# Patient Record
Sex: Female | Born: 1975 | Race: Black or African American | Hispanic: No
Health system: Midwestern US, Community
[De-identification: ages and names within clinical notes are randomized; demographics above are authoritative.]

## PROBLEM LIST (undated history)

## (undated) DIAGNOSIS — N644 Mastodynia: Secondary | ICD-10-CM

## (undated) DIAGNOSIS — R0789 Other chest pain: Secondary | ICD-10-CM

## (undated) DIAGNOSIS — E039 Hypothyroidism, unspecified: Secondary | ICD-10-CM

## (undated) DIAGNOSIS — Z794 Long term (current) use of insulin: Secondary | ICD-10-CM

## (undated) DIAGNOSIS — N760 Acute vaginitis: Secondary | ICD-10-CM

## (undated) DIAGNOSIS — R0609 Other forms of dyspnea: Secondary | ICD-10-CM

## (undated) DIAGNOSIS — G629 Polyneuropathy, unspecified: Secondary | ICD-10-CM

## (undated) DIAGNOSIS — E119 Type 2 diabetes mellitus without complications: Secondary | ICD-10-CM

## (undated) DIAGNOSIS — R1310 Dysphagia, unspecified: Secondary | ICD-10-CM

## (undated) DIAGNOSIS — E0800 Diabetes mellitus due to underlying condition with hyperosmolarity without nonketotic hyperglycemic-hyperosmolar coma (NKHHC): Secondary | ICD-10-CM

## (undated) DIAGNOSIS — B9689 Other specified bacterial agents as the cause of diseases classified elsewhere: Secondary | ICD-10-CM

## (undated) DIAGNOSIS — R079 Chest pain, unspecified: Secondary | ICD-10-CM

## (undated) DIAGNOSIS — I10 Essential (primary) hypertension: Secondary | ICD-10-CM

## (undated) DIAGNOSIS — M5430 Sciatica, unspecified side: Secondary | ICD-10-CM

## (undated) DIAGNOSIS — R1010 Upper abdominal pain, unspecified: Secondary | ICD-10-CM

## (undated) DIAGNOSIS — F3181 Bipolar II disorder: Principal | ICD-10-CM

## (undated) DIAGNOSIS — E559 Vitamin D deficiency, unspecified: Secondary | ICD-10-CM

## (undated) DIAGNOSIS — R000 Tachycardia, unspecified: Secondary | ICD-10-CM

## (undated) DIAGNOSIS — G44209 Tension-type headache, unspecified, not intractable: Secondary | ICD-10-CM

## (undated) DIAGNOSIS — R0600 Dyspnea, unspecified: Principal | ICD-10-CM

## (undated) DIAGNOSIS — R1012 Left upper quadrant pain: Secondary | ICD-10-CM

## (undated) DIAGNOSIS — R700 Elevated erythrocyte sedimentation rate: Secondary | ICD-10-CM

## (undated) DIAGNOSIS — M5442 Lumbago with sciatica, left side: Secondary | ICD-10-CM

## (undated) DIAGNOSIS — K047 Periapical abscess without sinus: Secondary | ICD-10-CM

## (undated) DIAGNOSIS — E785 Hyperlipidemia, unspecified: Secondary | ICD-10-CM

## (undated) DIAGNOSIS — Z6841 Body Mass Index (BMI) 40.0 and over, adult: Secondary | ICD-10-CM

## (undated) DIAGNOSIS — H93A3 Pulsatile tinnitus, bilateral: Principal | ICD-10-CM

## (undated) DIAGNOSIS — Z0130 Encounter for examination of blood pressure without abnormal findings: Secondary | ICD-10-CM

## (undated) DIAGNOSIS — E66813 Obesity, class 3: Secondary | ICD-10-CM

## (undated) DIAGNOSIS — R1030 Lower abdominal pain, unspecified: Secondary | ICD-10-CM

## (undated) DIAGNOSIS — G8929 Other chronic pain: Secondary | ICD-10-CM

## (undated) DIAGNOSIS — Z0000 Encounter for general adult medical examination without abnormal findings: Secondary | ICD-10-CM

## (undated) DIAGNOSIS — K5904 Chronic idiopathic constipation: Secondary | ICD-10-CM

## (undated) DIAGNOSIS — N939 Abnormal uterine and vaginal bleeding, unspecified: Secondary | ICD-10-CM

## (undated) DIAGNOSIS — M5441 Lumbago with sciatica, right side: Secondary | ICD-10-CM

## (undated) DIAGNOSIS — K760 Fatty (change of) liver, not elsewhere classified: Secondary | ICD-10-CM

## (undated) DIAGNOSIS — M1990 Unspecified osteoarthritis, unspecified site: Secondary | ICD-10-CM

## (undated) DIAGNOSIS — Z8679 Personal history of other diseases of the circulatory system: Secondary | ICD-10-CM

## (undated) DIAGNOSIS — M792 Neuralgia and neuritis, unspecified: Secondary | ICD-10-CM

## (undated) DIAGNOSIS — K219 Gastro-esophageal reflux disease without esophagitis: Secondary | ICD-10-CM

## (undated) DIAGNOSIS — I2510 Atherosclerotic heart disease of native coronary artery without angina pectoris: Secondary | ICD-10-CM

## (undated) DIAGNOSIS — F959 Tic disorder, unspecified: Secondary | ICD-10-CM

## (undated) DIAGNOSIS — K589 Irritable bowel syndrome without diarrhea: Secondary | ICD-10-CM

## (undated) DIAGNOSIS — R51 Headache: Secondary | ICD-10-CM

## (undated) DIAGNOSIS — E7800 Pure hypercholesterolemia, unspecified: Secondary | ICD-10-CM

## (undated) DIAGNOSIS — J45909 Unspecified asthma, uncomplicated: Secondary | ICD-10-CM

## (undated) HISTORY — PX: ABDOMINAL HYSTERECTOMY: SHX81

## (undated) HISTORY — PX: CHOLECYSTECTOMY: SHX55

## (undated) MED ORDER — IRBESARTAN 150 MG TAB
150 mg | ORAL_TABLET | Freq: Every evening | ORAL | Status: DC
Start: ? — End: 2021-03-07

## (undated) MED ORDER — MECLIZINE 25 MG TAB
25 mg | ORAL_TABLET | Freq: Three times a day (TID) | ORAL | Status: AC | PRN
Start: ? — End: 2013-03-10

## (undated) MED ORDER — RANITIDINE 150 MG TAB
150 mg | ORAL_TABLET | Freq: Two times a day (BID) | ORAL | Status: DC
Start: ? — End: 2019-10-22

## (undated) MED ORDER — HYDROCHLOROTHIAZIDE 12.5 MG TAB
12.5 mg | ORAL_TABLET | Freq: Every day | ORAL | Status: DC
Start: ? — End: 2020-04-28

## (undated) MED ORDER — AMLODIPINE 10 MG TAB
10 mg | ORAL_TABLET | Freq: Every day | ORAL | Status: DC
Start: ? — End: 2019-10-22

## (undated) MED ORDER — ATORVASTATIN 20 MG TAB
20 mg | ORAL_TABLET | Freq: Every day | ORAL | Status: DC
Start: ? — End: 2019-10-22

## (undated) MED ORDER — METFORMIN 1,000 MG TAB
1000 mg | ORAL_TABLET | Freq: Two times a day (BID) | ORAL | Status: DC
Start: ? — End: 2019-10-22

## (undated) MED ORDER — ONDANSETRON HCL 4 MG TAB
4 mg | ORAL_TABLET | Freq: Three times a day (TID) | ORAL | Status: DC | PRN
Start: ? — End: 2020-04-28

## (undated) MED ORDER — MAGNESIUM OXIDE 250 MG TAB
250 mg magnesium | ORAL_TABLET | Freq: Two times a day (BID) | ORAL | Status: AC
Start: ? — End: 2013-02-22

## (undated) MED ORDER — HYDROCODONE-ACETAMINOPHEN 5 MG-325 MG TAB
5-325 mg | ORAL_TABLET | ORAL | Status: DC
Start: ? — End: 2020-04-28

## (undated) MED ORDER — GABAPENTIN 600 MG TAB
600 mg | ORAL_TABLET | Freq: Three times a day (TID) | ORAL | Status: DC
Start: ? — End: 2020-04-28

## (undated) MED ORDER — HYDROCODONE-ACETAMINOPHEN 5 MG-325 MG TAB
5-325 mg | ORAL_TABLET | ORAL | Status: DC
Start: ? — End: 2012-12-10

---

## 2009-10-01 ENCOUNTER — Ambulatory Visit: Payer: Self-pay | Admitting: Diagnostic Radiology

## 2009-10-01 ENCOUNTER — Emergency Department (HOSPITAL_BASED_OUTPATIENT_CLINIC_OR_DEPARTMENT_OTHER): Admission: EM | Admit: 2009-10-01 | Discharge: 2009-10-01 | Payer: Self-pay | Admitting: Emergency Medicine

## 2009-11-11 ENCOUNTER — Emergency Department (HOSPITAL_BASED_OUTPATIENT_CLINIC_OR_DEPARTMENT_OTHER): Admission: EM | Admit: 2009-11-11 | Discharge: 2009-11-11 | Payer: Self-pay | Admitting: Emergency Medicine

## 2011-01-15 LAB — URINE CULTURE

## 2011-01-15 LAB — URINALYSIS, ROUTINE W REFLEX MICROSCOPIC
Bilirubin Urine: NEGATIVE
Hgb urine dipstick: NEGATIVE
Ketones, ur: NEGATIVE mg/dL
Nitrite: NEGATIVE
Specific Gravity, Urine: 1.015 (ref 1.005–1.030)
Urobilinogen, UA: 0.2 mg/dL (ref 0.0–1.0)

## 2011-01-31 LAB — GLUCOSE, CAPILLARY

## 2011-01-31 LAB — COMPREHENSIVE METABOLIC PANEL
AST: 19 U/L (ref 0–37)
Albumin: 4.3 g/dL (ref 3.5–5.2)
Alkaline Phosphatase: 80 U/L (ref 39–117)
CO2: 28 mEq/L (ref 19–32)
Chloride: 106 mEq/L (ref 96–112)
GFR calc non Af Amer: >60 mL/min (ref >60)
Potassium: 4.1 mEq/L (ref 3.5–5.1)
Total Bilirubin: 0.3 mg/dL (ref 0.3–1.2)

## 2011-01-31 LAB — DIFFERENTIAL
Basophils Absolute: 0 10*3/uL (ref 0.0–0.1)
Basophils Relative: 0 % (ref 0–1)
Eosinophils Absolute: 0.4 10*3/uL (ref 0.0–0.7)
Eosinophils Relative: 4 % (ref 0–5)
Monocytes Absolute: 0.3 10*3/uL (ref 0.1–1.0)
Monocytes Relative: 3 % (ref 3–12)

## 2011-01-31 LAB — URINALYSIS, ROUTINE W REFLEX MICROSCOPIC
Glucose, UA: NEGATIVE mg/dL
Ketones, ur: NEGATIVE mg/dL
Nitrite: NEGATIVE
Protein, ur: NEGATIVE mg/dL
pH: 8.5 — ABNORMAL HIGH (ref 5.0–8.0)

## 2011-01-31 LAB — CBC
Hemoglobin: 12.1 g/dL (ref 12.0–15.0)
Platelets: 268 10*3/uL (ref 150–400)
RDW: 12.6 % (ref 11.5–15.5)
WBC: 10.4 10*3/uL (ref 4.0–10.5)

## 2011-06-06 LAB — AMB POC HEMOGLOBIN A1C: Hemoglobin A1c (POC): 6.2

## 2011-06-06 LAB — AMB POC GLUCOSE BLOOD, BY GLUCOSE MONITORING DEVICE: Glucose POC: 153

## 2011-06-06 MED ORDER — DICYCLOMINE 10 MG CAP
10 mg | ORAL_CAPSULE | Freq: Three times a day (TID) | ORAL | Status: DC
Start: 2011-06-06 — End: 2020-04-28

## 2011-06-06 MED ORDER — GLIPIZIDE 5 MG TAB
5 mg | ORAL_TABLET | Freq: Two times a day (BID) | ORAL | Status: DC
Start: 2011-06-06 — End: 2011-12-27

## 2011-06-06 MED ORDER — HYDROCHLOROTHIAZIDE 12.5 MG TAB
12.5 mg | ORAL_TABLET | Freq: Every day | ORAL | Status: DC
Start: 2011-06-06 — End: 2012-12-10

## 2011-06-06 MED ORDER — METFORMIN 1,000 MG TAB
1000 mg | ORAL_TABLET | Freq: Two times a day (BID) | ORAL | Status: DC
Start: 2011-06-06 — End: 2012-12-10

## 2011-06-06 MED ORDER — ATORVASTATIN 20 MG TAB
20 mg | ORAL_TABLET | Freq: Every day | ORAL | Status: DC
Start: 2011-06-06 — End: 2012-12-10

## 2011-06-06 MED ORDER — IRBESARTAN 150 MG TAB
150 mg | ORAL_TABLET | Freq: Every evening | ORAL | Status: DC
Start: 2011-06-06 — End: 2012-12-10

## 2011-06-06 MED ORDER — OMEPRAZOLE 20 MG CAP, DELAYED RELEASE
20 mg | ORAL_CAPSULE | Freq: Two times a day (BID) | ORAL | Status: DC
Start: 2011-06-06 — End: 2020-04-28

## 2011-06-06 NOTE — Progress Notes (Signed)
Pt being seen for hospital follow up and establish care

## 2011-06-06 NOTE — Progress Notes (Signed)
HISTORY OF PRESENT ILLNESS  Christy [name] is a 35 y.o. female.  HPIEstablish care-1.DM2 2 HTN 3.Hyperlipidemia 4.Obesity.Recent hospitalisation at [facility] with chest pain-stress test-apical ischemia/CT angiogram-20% blockage of LAD.Reviewed detail hospital records.    Review of Systems   Constitutional: Negative for malaise/fatigue.   HENT: Negative for nosebleeds.    Eyes: Negative for blurred vision, double vision and pain.   Respiratory: Negative for cough, shortness of breath and wheezing.    Cardiovascular: Negative for chest pain, palpitations, orthopnea, claudication, leg swelling and PND.   Neurological: Negative for dizziness, weakness and headaches.   All other systems reviewed and are negative.      Patient Active Problem List   Diagnoses Code   ??? CAD (coronary artery disease) 414.00   ??? Hyperlipidemia LDL goal < 70 272.4   ??? Hypertension 401.9   ??? Obesity 278.00   ??? DM (diabetes mellitus) 250.00   ??? GERD (gastroesophageal reflux disease) 530.81     Current Outpatient Prescriptions   Medication Sig Dispense Refill   ??? metFORMIN (GLUCOPHAGE) 1,000 mg tablet Take 1 Tab by mouth two (2) times daily (with meals).  60 Tab  5   ??? glipiZIDE (GLUCOTROL) 5 mg tablet Take 1 Tab by mouth two (2) times a day.  30 Tab  5   ??? atorvastatin (LIPITOR) 20 mg tablet Take 1 Tab by mouth daily.  30 Tab  5   ??? irbesartan (AVAPRO) 150 mg tablet Take 1 Tab by mouth nightly.  30 Tab  5   ??? Hydrochlorothiazide 12.5 mg tablet Take 12.5 mg by mouth daily.  30 Tab  5   ??? omeprazole (PRILOSEC) 20 mg capsule Take 1 Cap by mouth two (2) times a day.  60 Cap  6   ??? dicyclomine (BENTYL) 10 mg capsule Take 1 Cap by mouth three (3) times daily. PRN  30 Cap  0       Blood pressure 140/92, pulse 96, temperature 98.4 ??F (36.9 ??C), temperature source Oral, resp. rate 18, weight 244 lb (110.678 kg).    Physical Exam   Constitutional: She is oriented to person, place, and time. She appears well-developed and well-nourished.   HENT:    Head: Normocephalic.   Eyes: Pupils are equal, round, and reactive to light.   Neck: No JVD present. No tracheal deviation present. No thyromegaly present.   Cardiovascular: Normal rate, regular rhythm, normal heart sounds and intact distal pulses.   No extrasystoles are present. PMI is not displaced.  Exam reveals no gallop, no S3, no S4, no distant heart sounds and no friction rub.    No murmur heard.  Pulses:       Carotid pulses are 1+ on the right side, and 1+ on the left side.  Pulmonary/Chest: Effort normal and breath sounds normal. No respiratory distress. She has no wheezes. She has no rales. She exhibits no tenderness.   Abdominal: Soft. Bowel sounds are normal. She exhibits no abdominal bruit. There is no splenomegaly or hepatomegaly.   Musculoskeletal: She exhibits no edema.   Lymphadenopathy:     She has no cervical adenopathy.   Neurological: She is alert and oriented to person, place, and time. She displays normal reflexes. No cranial nerve deficit. She exhibits normal muscle tone. Coordination normal.   Skin: Skin is warm. No rash noted.   Psychiatric: She has a normal mood and affect. Her behavior is normal. Judgment and thought content normal.     Results for orders  placed in visit on 06/06/11   AMB POC GLUCOSE BLOOD, BY GLUCOSE MONITORING DEVICE       Component Value Range    Glucose POC 153     AMB POC HEMOGLOBIN A1C       Component Value Range    Hemoglobin A1C 6.2         ASSESSMENT and PLAN  1. DM (diabetes mellitus)  AMB POC GLUCOSE BLOOD, BY GLUCOSE MONITORING DEVICE, AMB POC HEMOGLOBIN A1C   2. Obesity     3. Hypertension     4. Hyperlipidemia LDL goal < 70     5. CAD (coronary artery disease)     6. GERD (gastroesophageal reflux disease)       Medications Ordered Today   Medications   ??? metFORMIN (GLUCOPHAGE) 1,000 mg tablet     Sig: Take 1 Tab by mouth two (2) times daily (with meals).     Dispense:  60 Tab     Refill:  5   ??? glipiZIDE (GLUCOTROL) 5 mg tablet      Sig: Take 1 Tab by mouth two (2) times a day.     Dispense:  30 Tab     Refill:  5   ??? atorvastatin (LIPITOR) 20 mg tablet     Sig: Take 1 Tab by mouth daily.     Dispense:  30 Tab     Refill:  5   ??? irbesartan (AVAPRO) 150 mg tablet     Sig: Take 1 Tab by mouth nightly.     Dispense:  30 Tab     Refill:  5   ??? Hydrochlorothiazide 12.5 mg tablet     Sig: Take 12.5 mg by mouth daily.     Dispense:  30 Tab     Refill:  5   ??? omeprazole (PRILOSEC) 20 mg capsule     Sig: Take 1 Cap by mouth two (2) times a day.     Dispense:  60 Cap     Refill:  6   ??? dicyclomine (BENTYL) 10 mg capsule     Sig: Take 1 Cap by mouth three (3) times daily. PRN     Dispense:  30 Cap     Refill:  0       the following changes in treatment are made: as above  lab results and schedule of future lab studies reviewed with patient  reviewed diet, exercise and weight control  cardiovascular risk and specific lipid/LDL goals reviewed  reviewed medications and side effects in detail  use of aspirin to prevent MI and TIA's discussed

## 2011-06-16 NOTE — Telephone Encounter (Signed)
I called this patient on Thursday and Friday and left a message letting her know that without her telling me what medications she has tried before there is no way I can get authorization from her insurance company.

## 2011-10-03 LAB — AMB POC GLUCOSE BLOOD, BY GLUCOSE MONITORING DEVICE: Glucose POC: 143

## 2011-10-03 MED ORDER — FLUCONAZOLE 150 MG TAB
150 mg | ORAL_TABLET | Freq: Every day | ORAL | Status: DC
Start: 2011-10-03 — End: 2011-11-29

## 2011-10-03 MED ORDER — METRONIDAZOLE 500 MG TAB
500 mg | ORAL_TABLET | Freq: Three times a day (TID) | ORAL | Status: AC
Start: 2011-10-03 — End: 2011-10-13

## 2011-10-03 NOTE — Progress Notes (Signed)
error 

## 2011-10-03 NOTE — Progress Notes (Signed)
Error see other encounter

## 2011-10-03 NOTE — Progress Notes (Signed)
Pt being seen for vaginal odor.

## 2011-10-03 NOTE — Progress Notes (Signed)
HISTORY OF PRESENT ILLNESS  Christy [name] is a 35 y.o. female.  Vaginal Discharge   This is a new (mainly concerned about vaginal odor-currently on antibiotic) problem. The current episode started more than 2 days ago. The problem has not changed since onset.The discharge was white. Pertinent negatives include no anorexia, no diaphoresis, no fever, no abdominal swelling, no abdominal pain, no constipation, no diarrhea, no nausea, no vomiting, no dyspareunia, no dysuria, no frequency, no genital burning, no genital itching, no genital lesions, no perineal pain, no perineal odor and no painful intercourse. She has tried nothing for the symptoms. The treatment provided no relief. Her past medical history does not include irregular periods, PID, STD, ectopic pregnancy, ovarian cysts or infertility.   Diabetes  This is a chronic problem. The problem has not changed since onset.Pertinent negatives include no chest pain, no abdominal pain, no headaches and no shortness of breath. Treatments tried: currently on OAD,last hba1c=6.2,last opth exam had today.   Hypertension   This is a chronic problem. The problem has not changed since onset.Pertinent negatives include no chest pain, no orthopnea, no palpitations, no PND, no malaise/fatigue, no blurred vision, no headaches, no dizziness, no shortness of breath, no nausea and no vomiting. There are no associated agents to hypertension. Risk factors include family history, dyslipidemia, diabetes mellitus, obesity and a sedentary lifestyle.   Chest Pain (Angina)    This is a recurrent (chest pain x 2-3 months-seen in ER had angiogram showed mild blockage of CAD.Recent worsening of chest pain 7/10 today with substernal pressure) problem. The problem has not changed since onset.The problem occurs daily. The pain is present in the substernal region. The pain is at a severity of 6/10. The pain is moderate. The pain does not radiate. Associated symptoms include exertional chest pressure. Pertinent negatives include no abdominal pain, no back pain, no claudication, no cough, no diaphoresis, no dizziness, no fever, no headaches, no malaise/fatigue, no nausea, no orthopnea, no palpitations, no PND, no shortness of breath, no vomiting and no weakness. She has tried nothing for the symptoms. The treatment provided no relief. Risk factors include family history, diabetes mellitus, obesity, a sendentary lifestyle, smoking/tobacco exposure and dyslipidemia. Procedural history includes cardiac catheterization.Pertinent negatives include no echocardiogram, no EPS study, no persantine thallium, no stress echo, no stress thallium, no exercise treadmill test, no cardiac stents, no angioplasty, no [place] stents, no pacemaker and no AICD.      Review of Systems   Constitutional: Negative for fever, chills, malaise/fatigue and diaphoresis.   HENT: Negative for nosebleeds.    Eyes: Negative for blurred vision, double vision and pain.   Respiratory: Negative for cough, shortness of breath and wheezing.    Cardiovascular: Negative for chest pain, palpitations, orthopnea, claudication, leg swelling and PND.   Gastrointestinal: Negative for nausea, vomiting, abdominal pain, diarrhea, constipation and anorexia.   Genitourinary: Positive for vaginal discharge. Negative for dysuria, frequency and dyspareunia.   Musculoskeletal: Negative for back pain.   Neurological: Negative for dizziness, weakness and headaches.   All other systems reviewed and are negative.      Patient Active Problem List    Diagnoses Code   ??? CAD (coronary artery disease) 414.00   ??? Hyperlipidemia LDL goal < 70 272.4   ??? Hypertension 401.9   ??? Obesity 278.00   ??? DM (diabetes mellitus) 250.00   ??? GERD (gastroesophageal reflux disease) 530.81     Current Outpatient Prescriptions   Medication Sig Dispense Refill   ???  fluconazole (DIFLUCAN) 150 mg tablet Take 1 Tab by mouth daily for 1 day.  1 Tab  0   ??? metroNIDAZOLE (FLAGYL) 500 mg tablet Take 1 Tab by mouth three (3) times daily for 10 days.  30 Tab  0   ??? oxyCODONE-acetaminophen (ENDOCET) 5-325 mg per tablet Take 1 Tab by mouth every four (4) hours as needed.         ??? naproxen (NAPROSYN) 500 mg tablet Take 500 mg by mouth two (2) times daily (with meals).         ??? metFORMIN (GLUCOPHAGE) 1,000 mg tablet Take 1 Tab by mouth two (2) times daily (with meals).  60 Tab  5   ??? glipiZIDE (GLUCOTROL) 5 mg tablet Take 1 Tab by mouth two (2) times a day.  30 Tab  5   ??? atorvastatin (LIPITOR) 20 mg tablet Take 1 Tab by mouth daily.  30 Tab  5   ??? irbesartan (AVAPRO) 150 mg tablet Take 1 Tab by mouth nightly.  30 Tab  5   ??? Hydrochlorothiazide 12.5 mg tablet Take 12.5 mg by mouth daily.  30 Tab  5   ??? omeprazole (PRILOSEC) 20 mg capsule Take 1 Cap by mouth two (2) times a day.  60 Cap  6   ??? dicyclomine (BENTYL) 10 mg capsule Take 1 Cap by mouth three (3) times daily. PRN  30 Cap  0       Blood pressure 134/88, pulse 78, temperature 99.2 ??F (37.3 ??C), temperature source Oral, resp. rate 18, height 5\' 1"  (1.549 m), weight 252 lb (114.306 kg).    Physical Exam   Constitutional: She appears well-developed and well-nourished.   HENT:   Mouth/Throat: Dental abscesses present.   Neck: No JVD present. No tracheal deviation present. No thyromegaly present.   Cardiovascular: Normal rate, regular rhythm, normal heart sounds and intact distal pulses.   No extrasystoles are present. PMI is not displaced.  Exam reveals no gallop, no S3, no S4, no distant heart sounds and no friction rub.    No murmur heard.   Pulses:       Carotid pulses are 1+ on the right side, and 1+ on the left side.  Pulmonary/Chest: Effort normal and breath sounds normal. No respiratory distress. She has no wheezes. She has no rales. She exhibits no tenderness.   Abdominal: Soft. Bowel sounds are normal. She exhibits no abdominal bruit. There is no splenomegaly or hepatomegaly.   Genitourinary: There is no rash, tenderness, lesion or injury on the right labia. There is no rash, tenderness, lesion or injury on the left labia. Cervix exhibits no discharge. There is erythema around the vagina. No tenderness or bleeding around the vagina. No foreign body around the vagina. Vaginal discharge found.   Musculoskeletal: She exhibits no edema.   Lymphadenopathy:     She has no cervical adenopathy.   Skin: Skin is warm. No rash noted.   Psychiatric: Her behavior is normal. Judgment normal.       ASSESSMENT and PLAN  1. Vaginal Discharge  WET PREP, CHLAMYDIA/GC AMPLIFIED, HIV 1/O/2 AB WITH CONFIRMATION, RPR, HSV TYPE 2 AB IGG, CSF   2. Vaginal odor  WET PREP, CHLAMYDIA/GC AMPLIFIED   3. CAD (coronary artery disease)  REFERRAL TO CARDIOLOGY, AMB POC EKG ROUTINE W/ 12 LEADS, INTER & REP   4. DM (diabetes mellitus)  METABOLIC PANEL, COMPREHENSIVE, LIPID PANEL, TSH, 3RD GENERATION, AMB POC GLUCOSE BLOOD, BY GLUCOSE MONITORING DEVICE, HEMOGLOBIN A1C, PR  SPECIMEN HANDLING,DR OFF->LAB, PR COLLECTION VENOUS BLOOD,VENIPUNCTURE   5. Hypertension  METABOLIC PANEL, COMPREHENSIVE, TSH, 3RD GENERATION   6. Obesity, Class III, BMI 40-49.9 (morbid obesity)  REFERRAL TO BARIATRIC SURGERY   7. Chest pain at rest     Pt declined to go to ER(saying chest pain better) -signed AMA-see scanned,continue same regimen,follow up Dentist.  the following changes in treatment are made: as above  lab results and schedule of future lab studies reviewed with patient  reviewed diet, exercise and weight control  cardiovascular risk and specific lipid/LDL goals reviewed   reviewed medications and side effects in detail  use of aspirin to prevent MI and TIA's discussed

## 2011-11-14 MED ORDER — MECLIZINE 25 MG TAB
25 mg | ORAL_TABLET | Freq: Three times a day (TID) | ORAL | Status: AC | PRN
Start: 2011-11-14 — End: 2011-11-24

## 2011-11-14 NOTE — Progress Notes (Signed)
HISTORY OF PRESENT ILLNESS  Christy [name] is a 36 y.o. female.  HPI Pre operative exam-L Elbow radial collateral ligament    Review of Systems   Constitutional: Negative for malaise/fatigue.   HENT: Negative for nosebleeds.    Eyes: Negative for blurred vision, double vision and pain.   Respiratory: Negative for cough, shortness of breath and wheezing.    Cardiovascular: Negative for chest pain (h/o intermittent nonspecific chest pain-evaluated by Cardiologist Dr Madilyn Hook), palpitations, orthopnea, claudication, leg swelling and PND.   Musculoskeletal: Positive for joint pain.   Neurological: Positive for dizziness and headaches. Negative for weakness.     Blood pressure 140/90, pulse 90, temperature 98.8 ??F (37.1 ??C), temperature source Oral, resp. rate 18, height 5\' 1"  (1.549 m), weight 249 lb (112.946 kg).    Physical Exam   Constitutional: She appears well-developed and well-nourished.   Neck: No JVD present. No tracheal deviation present. No thyromegaly present.   Cardiovascular: Normal rate, regular rhythm, normal heart sounds and intact distal pulses.   No extrasystoles are present. PMI is not displaced.  Exam reveals no gallop, no S3, no S4, no distant heart sounds and no friction rub.    No murmur heard.  Pulses:       Carotid pulses are 1+ on the right side, and 1+ on the left side.  Pulmonary/Chest: Effort normal and breath sounds normal. No respiratory distress. She has no wheezes. She has no rales. She exhibits no tenderness.   Abdominal: Soft. Bowel sounds are normal. She exhibits no abdominal bruit. There is no splenomegaly or hepatomegaly.   Musculoskeletal: She exhibits no edema.        Left elbow: She exhibits decreased range of motion. She exhibits no swelling, no effusion and no deformity. tenderness found. Radial head tenderness noted.   Lymphadenopathy:     She has no cervical adenopathy.   Skin: Skin is warm. No rash noted.   Psychiatric: Her behavior is normal. Judgment normal.     Reviewed  labs/EKG  ASSESSMENT and PLAN  1. Obesity    2. Hypertension    3. Hyperlipidemia LDL goal < 70    4. Left elbow pain    5. Preop examination      Medications Ordered Today   Medications   ??? meclizine (ANTIVERT) 25 mg tablet     Sig: Take 1 Tab by mouth three (3) times daily as needed for 10 days.     Dispense:  30 Tab     Refill:  0     Cleared for surgery-need monitoring of Potassium/Blood glucose-non fasting abnormal results  the following changes in treatment are made: as above,follow up Dr Madilyn Hook if recurrent Chest pain  lab results and schedule of future lab studies reviewed with patient  reviewed medications and side effects in detail

## 2011-11-14 NOTE — Progress Notes (Signed)
Pt being seen for pre op exam.

## 2011-11-17 NOTE — Telephone Encounter (Signed)
Called pt to let her know her pre op was complete and all her results came back in. Pt aware and clearance was faxed to specialist.

## 2011-11-29 MED ORDER — DIFLUCAN 150 MG TABLET
150 mg | ORAL_TABLET | ORAL | Status: DC
Start: 2011-11-29 — End: 2011-12-27

## 2011-11-29 NOTE — Telephone Encounter (Signed)
oked per Dr. Leonard Schwartz

## 2011-12-27 LAB — AMB POC URINALYSIS DIP STICK AUTO W/O MICRO
Bilirubin (UA POC): NEGATIVE
Blood (UA POC): NEGATIVE
Glucose (UA POC): NEGATIVE
Ketones (UA POC): NEGATIVE
Leukocyte esterase (UA POC): NEGATIVE
Nitrites (UA POC): NEGATIVE
Protein (UA POC): NEGATIVE mg/dL
Specific gravity (UA POC): 1.025 (ref 1.001–1.035)
Urobilinogen (UA POC): 0.2 (ref 0.2–1)
pH (UA POC): 5.5 (ref 4.6–8.0)

## 2011-12-27 LAB — AMB POC GLUCOSE BLOOD, BY GLUCOSE MONITORING DEVICE: Glucose POC: 125

## 2011-12-27 MED ORDER — GABAPENTIN 600 MG TAB
600 mg | ORAL_TABLET | Freq: Three times a day (TID) | ORAL | Status: DC
Start: 2011-12-27 — End: 2012-12-10

## 2011-12-27 MED ORDER — AMLODIPINE 10 MG TAB
10 mg | ORAL_TABLET | Freq: Every day | ORAL | Status: DC
Start: 2011-12-27 — End: 2012-12-10

## 2011-12-27 MED ORDER — POTASSIUM CHLORIDE SR 20 MEQ TAB, PARTICLES/CRYSTALS
20 mEq | ORAL_TABLET | Freq: Every day | ORAL | Status: DC
Start: 2011-12-27 — End: 2019-10-22

## 2011-12-27 NOTE — Progress Notes (Signed)
Chief Complaint    The patient has a known history of diabetes and is overweight.  She also has a history of hypertension.    Ancillary problems    Problem #1  HPI :::    diabetes mellitus; Longstanding condition known to the practice;                         :::  Current Medication :::  metFORMIN (GLUCOPHAGE) 1,000 mg tablet   glipiZIDE (GLUCOTROL) 5 mg tablet           :::      Reason for visit :::                  :::    Status at last visit :::               :::      (See ROS)  (See Exam)    Assessment/Plan :::   Will DC glipizide;                :::    Note: Glipizide is being discontinued because the patient does not use.  The patient does not use glipizide because it makes her blood sugar dropped to below and she has had hypoglycemic events while taking this drug.  For the time being no substitute will be made.  We will see how well controlled her diabetes is on 1000 mg of metformin twice a day alone.      Problem #2  Dx: Hypertension   Rx: See below    Vitals 12/27/2011 11/14/2011   Systolic 138 140   Diastolic 96 90   Pulse 100 90   Temp 98 98.8   Respirations 20 18   Weight 244 249   Height 5\' 1"  5\' 1"    BMI 46.13 47.07       irbesartan (AVAPRO) 150 mg tablet   Hydrochlorothiazide 12.5 mg tablet   amLODIPine (NORVASC) 10 mg tablet    As listed above the patient's blood pressure is currently being controlled with Avapro hydrochlorothiazide and amlodipine.  Given that she did not take any of these today her blood pressure is in reasonably good control.  Her Norvasc needs to be refilled her other 2 have sufficient refills currently.  I will add a potassium tablet to her schedule since she is taking hydrochlorothiazide.    Problem #3  The patient also complains of swallowing difficulties.  The swallowing problem started several months ago and have accelerated.  The patient states that nearly every meal some form of swallowing difficulty occurs causing her to cough up food or wash food down with a beverage.     Impression: This is fine she of relatively new onset.   Plan: Barium swallow.  Follow-up: We'll see patient in followup after the barium swallow       Problem #4  Hyperlipidemia / Hypercholesterolemia   Rx: Lipitor     LDL 113.Marland Kitchen..2012  TC 180...Marland KitchenMarland Kitchen 2012          Each of the above were addressed. Each represents an acute or a chronic problem. Each was, with associated medication, discussed with the patient.    Current medications:    Current Outpatient Prescriptions   Medication Sig Dispense Refill   ??? gabapentin (NEURONTIN) 600 mg tablet Take  by mouth three (3) times daily.       ??? amLODIPine (NORVASC) 10 mg tablet Take  by mouth daily.         ???  metFORMIN (GLUCOPHAGE) 1,000 mg tablet Take 1 Tab by mouth two (2) times daily (with meals).  60 Tab  5   ??? glipiZIDE (GLUCOTROL) 5 mg tablet Take 1 Tab by mouth two (2) times a day.  30 Tab  5   ??? atorvastatin (LIPITOR) 20 mg tablet Take 1 Tab by mouth daily.  30 Tab  5   ??? irbesartan (AVAPRO) 150 mg tablet Take 1 Tab by mouth nightly.  30 Tab  5   ??? Hydrochlorothiazide 12.5 mg tablet Take 12.5 mg by mouth daily.  30 Tab  5   ??? omeprazole (PRILOSEC) 20 mg capsule Take 1 Cap by mouth two (2) times a day.  60 Cap  6   ??? dicyclomine (BENTYL) 10 mg capsule Take 1 Cap by mouth three (3) times daily. PRN  30 Cap  0       Reviewed medications with patient at length answering questions concerning same.       Review of Systems   Constitutional: Neg  - fever   Neg  -chills   Neg  -sweats  Eyes: Neg  -blurred vision,  Neg  -double vision   ENT:  Neg  -hearing loss,  Neg  -sore throat, Neg  -vertigo  Respiratory:  Neg  -cough   Neg  -shortness of breath.   Cardiovascular: Neg  -chest pain; Neg  -palpitations;  Neg  -syncope;  Neg  -DOE   Gastrointestinal: Neg  -nausea;   Neg  -vomiting;   Neg  -abdominal pain;   Musculoskeletal: Neg  -joint pain; Neg  -back pain;  Neg  -Neck pain   Skin:  Neg  -rashes;   Neg  -dryness;  Neg  -mole  Endocrine: Neg  -polyuria;   Neg  -polydipsia;   Neg   -cold/heat intolerance;  Neurological: - Neg  -headaches;  Neg  -paresthesias  GU:  Neg  -hematuria;   Neg  -nocturia;   Neg  -hot flashes Neg  -groin pain  Heme:  Neg  - bruising;   Neg  -bleeding;   Neg  -lymph node enlargement;  Neg  -pale conjunctiva;  Pysch:  Neg  -agitation;   Neg  -memory disturbance;  Neg  -depression        Physical Exam      Christy [name] is a well developed 36 y.o. African American female in no distress .    Constitutional Well developed; nourished; conversant   Eyes Moist conjunctivae; EOMI; PERRLA; b/l sclera anicteric    Ears Nose Mouth Throat External Ears/Nose normal w/no scars, lesions, masses; moist mucous membranes   Neck Supple with no masses; no thyromegaly    Respiratory No rales/rhonchi/wheezes; Normal respiratory effort, no intercostal retractions   Cardiovascular RRR; no murmurs/rubs/gallops;S1/S2 normal; carotids without bruits; all extremities w/no edema; pedal pulses 2+   Abdomen Soft, non tender, no masses; No HSM   Skin No rashes, lesions, or ulcers noted; No tightening, or nodules noted   Neurologic CN II ???XII intact; no focal deficit; normal proprioception   Psychiatric Oriented to time, place, and person; judgment and insight intact; no signs of depression or anxiety         Assessment    See Problems #1 - #4      Also  1./ Refill all medications as shown on the [facility]  'medication icon' .  2./ Draw labs as shown on the order directives [facility] 'icon'  3./ Christy [name] has been told  to return to clinic should any adverse event occur before next appointment       Follow up    Call or return to clinic prn if these symptoms worsen or fail to improve as anticipated.

## 2011-12-27 NOTE — Progress Notes (Signed)
Patient here for combo clinic, medication refills pended

## 2012-01-01 ENCOUNTER — Encounter

## 2012-09-09 LAB — METABOLIC PANEL, COMPREHENSIVE
A-G Ratio: 0.9 (ref 0.8–1.7)
ALT (SGPT): 27 U/L (ref 12.0–78.0)
AST (SGOT): 14 U/L — ABNORMAL LOW (ref 15–37)
Albumin: 3.5 g/dL (ref 3.4–5.0)
Alk. phosphatase: 88 U/L (ref 50–136)
Anion gap: 4 mmol/L (ref 3.0–18)
BUN/Creatinine ratio: 15 (ref 12–20)
BUN: 8 MG/DL (ref 7.0–18)
Bilirubin, total: <0.1 MG/DL — ABNORMAL LOW (ref 0.2–1.0)
CO2: 28 MMOL/L (ref 21–32)
Calcium: 8.7 MG/DL (ref 8.5–10.1)
Chloride: 108 MMOL/L (ref 100–108)
Creatinine: 0.55 MG/DL — ABNORMAL LOW (ref 0.6–1.3)
GFR est AA: >60 mL/min/{1.73_m2} (ref >60)
GFR est non-AA: >60 mL/min/{1.73_m2} (ref >60)
Globulin: 3.9 g/dL (ref 2.0–4.0)
Glucose: 97 MG/DL (ref 74–99)
Potassium: 4 MMOL/L (ref 3.5–5.5)
Protein, total: 7.4 g/dL (ref 6.4–8.2)
Sodium: 140 MMOL/L (ref 136–145)

## 2012-09-09 LAB — CARDIAC PANEL,(CK, CKMB & TROPONIN)
CK - MB: <0.5 ng/ml — ABNORMAL LOW (ref 0.5–3.6)
CK: 84 U/L (ref 26–192)
Troponin-I, QT: <0.02 NG/ML (ref 0.0–0.045)

## 2012-09-09 LAB — CBC WITH AUTOMATED DIFF
ABS. BASOPHILS: 0 10*3/uL (ref 0.0–0.06)
ABS. EOSINOPHILS: 0.3 10*3/uL (ref 0.0–0.4)
ABS. LYMPHOCYTES: 3.1 10*3/uL (ref 0.9–3.6)
ABS. MONOCYTES: 0.3 10*3/uL (ref 0.05–1.2)
ABS. NEUTROPHILS: 4.6 10*3/uL (ref 1.8–8.0)
BASOPHILS: 0 % (ref 0–2)
EOSINOPHILS: 4 % (ref 0–5)
HCT: 35.5 % (ref 35.0–45.0)
HGB: 11.5 g/dL — ABNORMAL LOW (ref 12.0–16.0)
LYMPHOCYTES: 37 % (ref 21–52)
MCH: 27.8 PG (ref 24.0–34.0)
MCHC: 32.4 g/dL (ref 31.0–37.0)
MCV: 85.7 FL (ref 74.0–97.0)
MONOCYTES: 4 % (ref 3–10)
MPV: 11.3 FL (ref 9.2–11.8)
NEUTROPHILS: 55 % (ref 40–73)
PLATELET: 274 10*3/uL (ref 135–420)
RBC: 4.14 M/uL — ABNORMAL LOW (ref 4.20–5.30)
RDW: 12.5 % (ref 11.6–14.5)
WBC: 8.3 10*3/uL (ref 4.6–13.2)

## 2012-09-09 LAB — URINALYSIS W/ RFLX MICROSCOPIC
Bilirubin: NEGATIVE
Blood: NEGATIVE
Glucose: NEGATIVE MG/DL
Ketone: NEGATIVE MG/DL
Leukocyte Esterase: NEGATIVE
Nitrites: NEGATIVE
Protein: NEGATIVE MG/DL
Specific gravity: 1.02 (ref 1.003–1.030)
Urobilinogen: 0.2 EU/DL (ref 0.2–1.0)
pH (UA): 6 (ref 5.0–8.0)

## 2012-09-09 LAB — MAGNESIUM: Magnesium: 1.7 MG/DL — ABNORMAL LOW (ref 1.8–2.4)

## 2012-09-09 LAB — HCG URINE, QL: HCG urine, QL: NEGATIVE

## 2012-09-09 MED FILL — SODIUM CHLORIDE 0.9 % IV: INTRAVENOUS | Qty: 1000

## 2012-09-09 NOTE — ED Notes (Signed)
Patient armband removed and shredded  I have reviewed discharge instructions with the patient.  The patient verbalized understanding.

## 2012-09-09 NOTE — ED Notes (Signed)
Patient reports continued pain in her chest.  MD aware.

## 2012-09-09 NOTE — ED Notes (Signed)
"  I have been having chest pain since yesterday and it is worse now and I am dizzy and have a headache, my pressure is up."

## 2012-09-09 NOTE — ED Notes (Signed)
Patient reassessed after pain medication given.  Patient states "I feel like there is a brick sitting on my chest"  Patient placed on Oxygen via NC at 2L/min.  MD informed. Awaiting orders.

## 2012-09-09 NOTE — ED Provider Notes (Signed)
HPI Comments: Pt is a 36yo female with a h/o DM, HTN, dyslipidemia and obesity that presents to the ED with complaint chest pain with lightheadedness that has been intermittent since for the last 24 hours.  Pt this AM noted she would have lightheaded episodes while driving to work.  While at work pt would have nausea and had to come home.  Pt has not been following with a PMD due to the loss of her insurance.  Pt did not seen a Dr. In approximately one year.  Pt notes last year she was admitted to a [facility] facility and was told she had CAD but did not require intervention.  Pt mother is 14 and had a coronary stent placed.  Pt is a smoker and drinks socially. Renee Rival, MD 5:22 PM      Patient is a 36 y.o. female presenting with chest pain, dizziness, and headaches.   Chest Pain (Angina)   Associated symptoms include dizziness and headaches. Pertinent negatives include no abdominal pain, no back pain, no fever, no nausea, no palpitations, no shortness of breath, no vomiting and no weakness.   Dizziness   Associated symptoms include chest pain, headaches and dizziness. Pertinent negatives include no palpitations, no fever, no abdominal pain, no nausea, no vomiting, no congestion, no back pain and no weakness.   Headache  Associated symptoms include chest pain and headaches. Pertinent negatives include no abdominal pain and no shortness of breath.        Past Medical History   Diagnosis Date   ??? Acid indigestion    ??? Asthma    ??? Depression    ??? Diabetes    ??? Hypertension    ??? CAD (coronary artery disease)    ??? 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 Interstitial cystitis    ??? Diabetic neuropathy    ??? Acid reflux    ??? IBS (irritable bowel syndrome)         Past Surgical History   Procedure Laterality Date   ??? Hx cholecystectomy       2008         Family History   Problem Relation Age of Onset   ??? Diabetes Mother    ??? Heart Disease Mother    ??? Diabetes Father    ??? Cancer Sister    ??? Heart Disease  Maternal Aunt    ??? Cancer Paternal Aunt         History     Social History   ??? Marital Status: UNKNOWN     Spouse Name: N/A     Number of Children: N/A   ??? Years of Education: N/A     Occupational History   ??? Not on file.     Social History Main Topics   ??? Smoking status: Current Every Day Smoker -- 0.25 packs/day for 10 years   ??? Smokeless tobacco: Not on file   ??? Alcohol Use: No   ??? Drug Use: No   ??? Sexually Active: Not on file     Other Topics Concern   ??? Not on file     Social History Narrative   ??? No narrative on file                  ALLERGIES: Pcn      Review of Systems   Constitutional: Negative for fever, chills, fatigue and unexpected weight change.   HENT: Negative for congestion  and rhinorrhea.    Respiratory: Negative for chest tightness and shortness of breath.    Cardiovascular: Positive for chest pain. Negative for palpitations and leg swelling.   Gastrointestinal: Negative for nausea, vomiting and abdominal pain.   Genitourinary: Negative for dysuria.   Musculoskeletal: Negative for back pain.   Skin: Negative for rash.   Neurological: Positive for dizziness and headaches. Negative for weakness.   Psychiatric/Behavioral: The patient is not nervous/anxious.        Filed Vitals:    09/09/12 1638   BP: 160/105   Pulse: 108   Temp: 97.8 ??F (36.6 ??C)   Resp: 16   Height: 5\' 1"  (1.549 m)   Weight: 112.038 kg (247 lb)   SpO2: 99%            Physical Exam   Nursing note and vitals reviewed.  Constitutional: She is oriented to person, place, and time. She appears well-developed and well-nourished. No distress.   HENT:   Head: Normocephalic and atraumatic.   Right Ear: External ear normal.   Left Ear: External ear normal.   Nose: Nose normal.   Mouth/Throat: Oropharynx is clear and moist.   Eyes: Conjunctivae and EOM are normal. Pupils are equal, round, and reactive to light. No scleral icterus.   Neck: Normal range of motion. Neck supple. No JVD present. No tracheal deviation present. No thyromegaly present.    Cardiovascular: Normal rate, regular rhythm, normal heart sounds and intact distal pulses.  Exam reveals no gallop and no friction rub.    No murmur heard.  Pulmonary/Chest: Effort normal and breath sounds normal. She exhibits no tenderness.   Abdominal: Soft. Bowel sounds are normal. She exhibits no distension. There is no tenderness. There is no rebound and no guarding.   Musculoskeletal: Normal range of motion. She exhibits no edema and no tenderness.   Lymphadenopathy:     She has no cervical adenopathy.   Neurological: She is alert and oriented to person, place, and time. She has normal reflexes. No cranial nerve deficit. Coordination normal.   No sensory loss, Gait normal, Motor 5/5, no arm or leg drift   Skin: Skin is warm and dry.   Psychiatric: She has a normal mood and affect. Her behavior is normal. Judgment and thought content normal.        MDM     Differential Diagnosis; Clinical Impression; Plan:     Pt is a 36yo female with a h/o IBS, DM, HTN, dyslipidemia, smoking presents with lightheadedness, chest pain, and vague weakness for the last 24hrs.  Pt EKG is reassuring but pt has risk factors for CAD.  Will trend her labs, CXR, obtain cardiac tests from [facility], hydrate, then reevaluate. Renee Rival, MD 5:25 PM          Procedures    CXR NAI    EKG ST at 102     Pt nuclear stress from 06/01/11 at [facility] notes apical ischemia vs breast attenuation.  Renee Rival, MD 7:42 PM    Pt initial labs are reassuring.  Pt was comfortable and then had more chest pain.  Repeat EKG is reassuring.  Pt pain is vague and may be related to GERD vs muscular pain.  If the repeat trop is reassuring will arrange for outpatient stress testing and treat supportively. Renee Rival, MD 9:23 PM    Pt repeat trop is reassuring and will proceed with close outpatient care.  Pt agrees with the plan and to return  if at all worsened or concerned.  Pt to follow her BP with her PMD or the [facility].  Also  referred to the life coach.  Renee Rival, MD 9:35 PM

## 2012-09-10 ENCOUNTER — Encounter

## 2012-09-10 LAB — EKG, 12 LEAD, SUBSEQUENT
Atrial Rate: 86 {beats}/min
Calculated P Axis: 52 degrees
Calculated R Axis: 21 degrees
Calculated T Axis: 38 degrees
Diagnosis: NORMAL
P-R Interval: 164 ms
Q-T Interval: 364 ms
QRS Duration: 76 ms
QTC Calculation (Bezet): 435 ms
Ventricular Rate: 86 {beats}/min

## 2012-09-10 LAB — EKG, 12 LEAD, INITIAL
Atrial Rate: 102 {beats}/min
Calculated P Axis: 44 degrees
Calculated R Axis: 27 degrees
Calculated T Axis: 43 degrees
P-R Interval: 158 ms
Q-T Interval: 338 ms
QRS Duration: 82 ms
QTC Calculation (Bezet): 440 ms
Ventricular Rate: 102 {beats}/min

## 2012-09-10 LAB — POC TROPONIN-I: Troponin-I (POC): <0.04 ng/mL (ref 0.00–0.08)

## 2012-09-10 MED ADMIN — aspirin (ASPIRIN) tablet 325 mg: ORAL | NDC 00904200940

## 2012-09-10 MED ADMIN — famotidine (PF) (PEPCID) injection 20 mg: INTRAVENOUS | @ 01:00:00 | NDC 00641602201

## 2012-09-10 MED ADMIN — mylanta/donnatal (GI COCKTAIL COMPOUND) oral liquid: ORAL | @ 01:00:00 | NDC 17856042305

## 2012-09-10 MED ADMIN — 0.9% sodium chloride infusion: INTRAVENOUS | NDC 00409798309

## 2012-09-10 MED ADMIN — morphine injection 4 mg: INTRAVENOUS | NDC 00409189101

## 2012-09-10 MED ADMIN — ketorolac (TORADOL) injection 15 mg: INTRAVENOUS | @ 02:00:00 | NDC 00409379501

## 2012-09-10 MED FILL — FAMOTIDINE (PF) 20 MG/2 ML IV: 20 mg/2 mL | INTRAVENOUS | Qty: 2

## 2012-09-10 MED FILL — MORPHINE 4 MG/ML SYRINGE: 4 mg/mL | INTRAMUSCULAR | Qty: 1

## 2012-09-10 MED FILL — PHENOBARB-HYOSCYAMN-ATROPINE-SCOP 16.2 MG-0.1037 MG/5 ML (5 ML) ELIXIR: 16.2 mg-0.1037 mg/5 mL (5 mL) | ORAL | Qty: 10

## 2012-09-10 MED FILL — ASPIRIN 325 MG TAB: 325 mg | ORAL | Qty: 1

## 2012-09-10 MED FILL — KETOROLAC TROMETHAMINE 30 MG/ML INJECTION: 30 mg/mL (1 mL) | INTRAMUSCULAR | Qty: 1

## 2012-09-13 MED ADMIN — regadenoson (LEXISCAN) injection 0.4 mg: INTRAVENOUS | @ 13:00:00 | NDC 00469650189

## 2012-09-13 MED ADMIN — 0.9% sodium chloride infusion: INTRAVENOUS | @ 13:00:00 | NDC 00409798309

## 2012-09-13 MED FILL — SODIUM CHLORIDE 0.9 % IV: INTRAVENOUS | Qty: 1000

## 2012-09-13 MED FILL — LEXISCAN 0.4 MG/5 ML INTRAVENOUS SYRINGE: 0.4 mg/5 mL | INTRAVENOUS | Qty: 5

## 2012-09-13 NOTE — Procedures (Signed)
[facility]                            [facility]                               [address]                          [place], [place] [address]                           NUCLEAR CARDIOLOGY REPORT    PATIENT:    Christy [name], Christy [name]  MRN:        [id number]  BILLING:    [id number]  DATE:       09/13/2012  LOCATION:  REFERRING:  DICTATING:  Delena Bali, MD      PROCEDURE: Pharmacological cardiac nuclear stress test.    INDICATIONS: Chest pain.    BASELINE ELECTROCARDIOGRAM: Sinus rhythm. Normal EKG.    PROTOCOL: The patient was given Lexiscan infusion. Myoview was injection  both rest and stress with subsequent imaging. SPECT processing was  performed.    ELECTROCARDIOGRAM FINDINGS: No arrhythmias. No changes to suggest ischemia.      NUCLEAR FINDINGS: Left ventricular systolic function is normal with  estimated ejection fraction of 58%. No evidence of transient ischemic  dilatation. The mid to distal anterior septum is poorly visualized and  hypokinesis cannot be excluded in this area. There is a moderate-sized  defect along the entire mid to distal anterior wall with partial sparing of  the septum. This improves slightly during stress as well as with CT  attenuation correction; however, ischemia or previous infarction in this  territory cannot be completely excluded.    IMPRESSION  1. Intermediate risk pharmacological cardiac nuclear stress test.  2. Normal left ventricular systolic function with estimated ejection  fraction of 58% due to chest wall attenuation artifact hypokinesis along  the mid to distal anterior septum that cannot be excluded.  3. Medium-sized defect along the mid to distal anterior wall partial improvement during stress. This area is poorly visualized and a mild amount of ischemia or previous infarction in this territory cannot be completely excluded.  4. Normal electrocardiogram portion of stress test.  5. Further testing is recommended if  clinically indicated.      STAGE     MINUTES    MPH        GRADE      METS       HR         BP  I         1:00                                        110        169/1                                                                   08  II  1:00                                        113        153/9                                                                   8  III       1:00                                        111        162/1                                                                   03      POST      1:00                                        110        164/1                                                                   10                            Delena Bali, MD      RS:wmx  D: 09/13/2012  CScript:09/13/2012 08:55 P  CQDocID #: [id number]   CScriptDoc #:  [id number]   cc:   Myriam Jacobson, MD         Delena Bali, MD

## 2012-09-13 NOTE — Progress Notes (Signed)
Patient was given 10 mCi of Myoview for the Resting pictures.  Patient received 0.4 mg of Lexiscan for the exercise portion of the Stress test. Patient was then given 33 mCi of Myoview for the Stress pictures.   Armband was removed and disposed of before the patient left. Armband was removed and disposed of before the patient left.

## 2012-09-13 NOTE — Procedures (Signed)
[facility]                            [facility]                               [address]                          [place], [place] [address]                           NUCLEAR CARDIOLOGY REPORT    PATIENT:    [name], Christy M  MRN:        [id number]  BILLING:    [id number]  DATE:       09/13/2012  LOCATION:  REFERRING:  DICTATING:  Berna Gitto, MD      PROCEDURE: Pharmacological cardiac nuclear stress test.    INDICATIONS: Chest pain.    BASELINE ELECTROCARDIOGRAM: Sinus rhythm. Normal EKG.    PROTOCOL: The patient was given Lexiscan infusion. Myoview was injection  both rest and stress with subsequent imaging. SPECT processing was  performed.    ELECTROCARDIOGRAM FINDINGS: No arrhythmias. No changes to suggest ischemia.      NUCLEAR FINDINGS: Left ventricular systolic function is normal with  estimated ejection fraction of 58%. No evidence of transient ischemic  dilatation. The mid to distal anterior septum is poorly visualized and  hypokinesis cannot be excluded in this area. There is a moderate-sized  defect along the entire mid to distal anterior wall with partial sparing of  the septum. This improves slightly during stress as well as with CT  attenuation correction; however, ischemia or previous infarction in this  territory cannot be completely excluded.    IMPRESSION  1. Intermediate risk pharmacological cardiac nuclear stress test.  2. Normal left ventricular systolic function with estimated ejection  fraction of 58% due to chest wall attenuation artifact hypokinesis along  the mid to distal anterior septum that cannot be excluded.  3. Medium-sized defect along the mid to distal anterior wall partial improvement during stress. This area is poorly visualized and a mild amount of ischemia or previous infarction in this territory cannot be completely excluded.  4. Normal electrocardiogram portion of stress test.  5. Further testing is recommended if  clinically indicated.      STAGE     MINUTES    MPH        GRADE      METS       HR         BP  I         1:00                                        110        169/1                                                                   08  II          1:00                                        113        153/9                                                                   8  III       1:00                                        111        162/1                                                                   03      POST      1:00                                        110        164/1                                                                   10                            Amadi Frady, MD      RS:wmx  D: 09/13/2012  CScript:09/13/2012 08:55 P  CQDocID #: [id number]   CScriptDoc #:  [id number]   cc:   DANIEL SALOMONSKY, MD         Rosalee Tolley, MD

## 2012-09-18 NOTE — ED Provider Notes (Addendum)
Stress test results noted and pt will need cardiology followup.  Discussed the case with [facility] PA and will arrange an outpatient visit to review the test results as well evaluate the pt for the need for further testing. Renee Rival, MD 9:16 AM    Called to follow up with pt.  No answer and message left.  Cardiology has reached out to pt via a letter per the chart.  Also tried her husbands phone and no ability to leave a message.  Renee Rival, MD 9:22 AM

## 2012-12-10 LAB — METABOLIC PANEL, COMPREHENSIVE
A-G Ratio: 0.9 (ref 0.8–1.7)
ALT (SGPT): 26 U/L (ref 12.0–78.0)
AST (SGOT): 12 U/L — ABNORMAL LOW (ref 15–37)
Albumin: 3.7 g/dL (ref 3.4–5.0)
Alk. phosphatase: 99 U/L (ref 50–136)
Anion gap: 8 mmol/L (ref 3.0–18)
BUN/Creatinine ratio: 11 — ABNORMAL LOW (ref 12–20)
BUN: 9 MG/DL (ref 7.0–18)
Bilirubin, total: 0.1 MG/DL — ABNORMAL LOW (ref 0.2–1.0)
CO2: 29 MMOL/L (ref 21–32)
Calcium: 9.1 MG/DL (ref 8.5–10.1)
Chloride: 102 MMOL/L (ref 100–108)
Creatinine: 0.8 MG/DL (ref 0.6–1.3)
GFR est AA: >60 mL/min/{1.73_m2} (ref >60)
GFR est non-AA: >60 mL/min/{1.73_m2} (ref >60)
Globulin: 4.3 g/dL — ABNORMAL HIGH (ref 2.0–4.0)
Glucose: 156 MG/DL — ABNORMAL HIGH (ref 74–99)
Potassium: 3.9 MMOL/L (ref 3.5–5.5)
Protein, total: 8 g/dL (ref 6.4–8.2)
Sodium: 139 MMOL/L (ref 136–145)

## 2012-12-10 LAB — CBC WITH AUTOMATED DIFF
ABS. BASOPHILS: 0 10*3/uL (ref 0.0–0.06)
ABS. EOSINOPHILS: 0.3 10*3/uL (ref 0.0–0.4)
ABS. LYMPHOCYTES: 3.1 10*3/uL (ref 0.9–3.6)
ABS. MONOCYTES: 0.3 10*3/uL (ref 0.05–1.2)
ABS. NEUTROPHILS: 5.8 10*3/uL (ref 1.8–8.0)
BASOPHILS: 0 % (ref 0–2)
EOSINOPHILS: 3 % (ref 0–5)
HCT: 38.7 % (ref 35.0–45.0)
HGB: 12.3 g/dL (ref 12.0–16.0)
LYMPHOCYTES: 33 % (ref 21–52)
MCH: 27.6 PG (ref 24.0–34.0)
MCHC: 31.8 g/dL (ref 31.0–37.0)
MCV: 86.8 FL (ref 74.0–97.0)
MONOCYTES: 4 % (ref 3–10)
MPV: 12.1 FL — ABNORMAL HIGH (ref 9.2–11.8)
NEUTROPHILS: 60 % (ref 40–73)
PLATELET: 272 10*3/uL (ref 135–420)
RBC: 4.46 M/uL (ref 4.20–5.30)
RDW: 13.1 % (ref 11.6–14.5)
WBC: 9.5 10*3/uL (ref 4.6–13.2)

## 2012-12-10 LAB — EKG, 12 LEAD, INITIAL
Atrial Rate: 107 {beats}/min
Atrial Rate: 108 {beats}/min
Calculated P Axis: 54 degrees
Calculated P Axis: 64 degrees
Calculated R Axis: 32 degrees
Calculated R Axis: 76 degrees
Calculated T Axis: 49 degrees
Calculated T Axis: 57 degrees
P-R Interval: 158 ms
P-R Interval: 164 ms
Q-T Interval: 344 ms
Q-T Interval: 348 ms
QRS Duration: 82 ms
QRS Duration: 86 ms
QTC Calculation (Bezet): 460 ms
QTC Calculation (Bezet): 464 ms
Ventricular Rate: 107 {beats}/min
Ventricular Rate: 108 {beats}/min

## 2012-12-10 LAB — URINALYSIS W/ RFLX MICROSCOPIC
Bilirubin: NEGATIVE
Blood: NEGATIVE
Glucose: 100 MG/DL — AB
Ketone: NEGATIVE MG/DL
Leukocyte Esterase: NEGATIVE
Nitrites: NEGATIVE
Protein: NEGATIVE MG/DL
Specific gravity: 1.025 (ref 1.003–1.030)
Urobilinogen: 0.2 EU/DL (ref 0.2–1.0)
pH (UA): 7 (ref 5.0–8.0)

## 2012-12-10 LAB — CARDIAC PANEL,(CK, CKMB & TROPONIN)
CK - MB: <0.5 ng/ml — ABNORMAL LOW (ref 0.5–3.6)
CK: 69 U/L (ref 26–192)
Troponin-I, QT: <0.02 NG/ML (ref 0.00–0.06)

## 2012-12-10 LAB — D DIMER: D DIMER: <0.27 ug/ml(FEU) (ref <0.46)

## 2012-12-10 LAB — D-DIMER, QUANTITATIVE: D-Dimer, Quant: <0.27 ug/ml(FEU) (ref <0.46)

## 2012-12-10 MED ADMIN — cloNIDine (CATAPRES) tablet 0.2 mg: ORAL | @ 21:00:00 | NDC 62584065711

## 2012-12-10 MED FILL — CLONIDINE 0.1 MG TAB: 0.1 mg | ORAL | Qty: 2

## 2012-12-10 NOTE — ED Notes (Signed)
Per MD gave pt indigent form with instructions for prescriptions x 7.  Pt contacting spouse for ride home.

## 2012-12-10 NOTE — Consults (Signed)
12/10/2012 07:04P- Conducted Life Coach Consult to assist PT with PCP/Follow-up and insurance alternatives, Patient resides in the city of [place], Considering PT???s limited resources; PT fits Environmental manager) for [employer], explained in detail [facility] Program/Intake process. Referred PT to [facility]:    [facility]  [address] [place], [place] [address]    Open to residents of the [place] only. Because of the volume of patients needing care, [facility] operates BY APPOINTMENT ONLY.  Call [facility] at [phone], Ext. [phone]  ?? You must first make an eligibility appointment to determine if you qualify for care  ?? You will be told if there is a waiting list and given additional information  ?? You will be called back later with a date/time and be given a list of documents to bring to your eligibility appointment   You will be interviewed in person to determine if you qualify for care-[facility]

## 2012-12-10 NOTE — ED Provider Notes (Signed)
HPI Comments: 3:31 PM 12/10/2012    Christy [name] is a 37 y.o. female with Hx of HTN, DM, high cholesterol, CAD who presents to the ED with complaints of midsternal, non-radiating chest pain x 2 days. Pt states that she feels like she has difficulty breathing only when she talks. She reports having a stress test about 1 month ago. She has Hx of similar Sx before. She also c/o intermittent dizziness. She has been non-compliant with all of her medications for the past couple of months. She denies alleviating or exacerbating factors. She has no other complaints at this time.        The history is provided by the patient.        Past Medical History   Diagnosis Date   ??? Acid indigestion    ??? Asthma    ??? Depression    ??? Diabetes    ??? Hypertension    ??? CAD (coronary artery disease)    ??? 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 Interstitial cystitis    ??? Diabetic neuropathy    ??? Acid reflux    ??? IBS (irritable bowel syndrome)         Past Surgical History   Procedure Laterality Date   ??? Hx cholecystectomy       2008         Family History   Problem Relation Age of Onset   ??? Diabetes Mother    ??? Heart Disease Mother    ??? Diabetes Father    ??? Cancer Sister    ??? Heart Disease Maternal Aunt    ??? Cancer Paternal Aunt         History     Social History   ??? Marital Status: MARRIED     Spouse Name: N/A     Number of Children: N/A   ??? Years of Education: N/A     Occupational History   ??? Not on file.     Social History Main Topics   ??? Smoking status: Current Every Day Smoker -- 0.25 packs/day for 10 years   ??? Smokeless tobacco: Not on file   ??? Alcohol Use: No   ??? Drug Use: No   ??? Sexually Active: Yes -- Female partner(s)     Other Topics Concern   ??? Not on file     Social History Narrative   ??? No narrative on file                  ALLERGIES: Pcn      Review of Systems   Constitutional: Negative.    HENT: Negative.    Eyes: Negative.    Respiratory: Negative.    Cardiovascular: Positive for chest pain.   Gastrointestinal:  Negative.    Endocrine: Negative.    Genitourinary: Negative.    Musculoskeletal: Negative.    Skin: Negative.    Allergic/Immunologic: Negative.    Neurological: Positive for dizziness.   Hematological: Negative.    Psychiatric/Behavioral: Negative.    All other systems reviewed and are negative.        Filed Vitals:    12/10/12 1422 12/10/12 1430 12/10/12 1434   BP: 164/116 179/128    Pulse:  105    Resp:  24    SpO2: 99% 99% 98%            Physical Exam     MDM    Procedures  Medications ordered:   Medications   cloNIDine (CATAPRES) tablet 0.2 mg (not administered)         Lab findings:  Labs Reviewed   URINALYSIS W/ RFLX MICROSCOPIC - Abnormal; Notable for the following:     Glucose 100 (*)     All other components within normal limits   CBC WITH AUTOMATED DIFF - Abnormal; Notable for the following:     MPV 12.1 (*)     All other components within normal limits   METABOLIC PANEL, COMPREHENSIVE - Abnormal; Notable for the following:     Glucose 156 (*)     BUN/Creatinine ratio 11 (*)     Bilirubin, total 0.1 (*)     AST 12 (*)     Globulin 4.3 (*)     All other components within normal limits   CARDIAC PANEL,(CK, CKMB & TROPONIN) - Abnormal; Notable for the following:     CK - MB <0.5 (*)     All other components within normal limits   D DIMER       EKG interpretation:    X-Ray, CT or other radiology findings or impressions:  XR CHEST PA LAT    Final Result: IMPRESSION:         No evidence for active cardiopulmonary disease.        Thoracolumbar dextro scoliosis.   EKG, 12 LEAD, INITIAL    (Results Pending)       Progress notes, Consult notes or additional Procedure notes:     Reevaluation of patient:   I have reevaluated patient. Patient is feeling better    Dispo:  Patient was discharged in stable condition.  Patient is to return to emergency department with any new or worsening condition.            Scribe Attestation:   December 10, 2012 at 2:47 PM Erin E. Winebarger scribing for and in the presence of  Dr.Fe Okubo Sheran Luz, MD     Oswaldo Conroy. Winebarger, Water engineer:   I personally performed the services described in the documentation, reviewed the documentation, as recorded by the scribe in my presence, and it accurately and completely records my words and actions. December 10, 2012 at 4:20 PM - Daulton Harbaugh Sheran Luz, MD

## 2012-12-10 NOTE — ED Notes (Signed)
I have reviewed discharge instructions with the patient.  The patient verbalized understanding.  Patient in stable condition.  Denies needs or concerns.  No signs or symptoms of acute distress noted.

## 2012-12-10 NOTE — ED Notes (Signed)
Pt states she has mild pain all over but for the past 3 days she has chest pain that she specifies as left sided non-radiating.  Pain 9/10 describes as stabbing with increase of pain with inspiration and talking.  Bowel and bladder habits stated normal but urine appears darker.  Also describes bilat flank pain.  Denies OTC meds today.  States no improvement with rest.  Pain has been increasing over the past few days.  She was also triaged at [facility] ED today but left AMA due to prolonged wait

## 2012-12-10 NOTE — ED Notes (Signed)
Pt sleeping in bed.  No apparent distress.  Spouse not currently at bedside.  BP remains elevated.  Notified MD of updated status.

## 2013-02-19 ENCOUNTER — Inpatient Hospital Stay
Admit: 2013-02-19 | Discharge: 2013-02-19 | Disposition: A | Discharge status: Home / Self Care | Payer: Self-pay | Attending: Emergency Medicine

## 2013-02-19 LAB — CBC WITH AUTOMATED DIFF
ABS. BASOPHILS: 0 10*3/uL (ref 0.0–0.1)
ABS. EOSINOPHILS: 0.2 10*3/uL (ref 0.0–0.4)
ABS. LYMPHOCYTES: 1.3 10*3/uL (ref 0.9–3.6)
ABS. MONOCYTES: 0.3 10*3/uL (ref 0.05–1.2)
ABS. NEUTROPHILS: 9.2 10*3/uL — ABNORMAL HIGH (ref 1.8–8.0)
BASOPHILS: 0 % (ref 0–2)
EOSINOPHILS: 1 % (ref 0–5)
HCT: 39.7 % (ref 35.0–45.0)
HGB: 13.2 g/dL (ref 12.0–16.0)
LYMPHOCYTES: 12 % — ABNORMAL LOW (ref 21–52)
MCH: 28.4 PG (ref 24.0–34.0)
MCHC: 33.2 g/dL (ref 31.0–37.0)
MCV: 85.6 FL (ref 74.0–97.0)
MONOCYTES: 3 % (ref 3–10)
MPV: 12.1 FL — ABNORMAL HIGH (ref 9.2–11.8)
NEUTROPHILS: 84 % — ABNORMAL HIGH (ref 40–73)
PLATELET: 261 10*3/uL (ref 135–420)
RBC: 4.64 M/uL (ref 4.20–5.30)
RDW: 12.9 % (ref 11.6–14.5)
WBC: 11 10*3/uL (ref 4.6–13.2)

## 2013-02-19 LAB — URINALYSIS W/ RFLX MICROSCOPIC
Bilirubin: NEGATIVE
Blood: NEGATIVE
Glucose: NEGATIVE mg/dL
Ketone: NEGATIVE mg/dL
Leukocyte Esterase: NEGATIVE
Nitrites: NEGATIVE
Specific gravity: >1.03 — ABNORMAL HIGH (ref 1.003–1.030)
Urobilinogen: 0.2 EU/dL (ref 0.2–1.0)
pH (UA): 6 (ref 5.0–8.0)

## 2013-02-19 LAB — URINE MICROSCOPIC ONLY
RBC: 0 /hpf (ref 0–5)
WBC: 0 /hpf (ref 0–4)

## 2013-02-19 LAB — METABOLIC PANEL, COMPREHENSIVE
A-G Ratio: 0.9 (ref 0.8–1.7)
ALT (SGPT): 29 U/L (ref 12.0–78.0)
AST (SGOT): 12 U/L — ABNORMAL LOW (ref 15–37)
Albumin: 3.9 g/dL (ref 3.4–5.0)
Alk. phosphatase: 76 U/L (ref 45–117)
Anion gap: 7 mmol/L (ref 3.0–18)
BUN/Creatinine ratio: 19 (ref 12–20)
BUN: 15 MG/DL (ref 7.0–18)
Bilirubin, total: 0.4 MG/DL (ref 0.2–1.0)
CO2: 24 mmol/L (ref 21–32)
Calcium: 8.9 MG/DL (ref 8.5–10.1)
Chloride: 105 mmol/L (ref 100–108)
Creatinine: 0.81 MG/DL (ref 0.6–1.3)
GFR est AA: >60 mL/min/{1.73_m2} (ref >60)
GFR est non-AA: >60 mL/min/{1.73_m2} (ref >60)
Globulin: 4.4 g/dL — ABNORMAL HIGH (ref 2.0–4.0)
Glucose: 157 mg/dL — ABNORMAL HIGH (ref 74–99)
Potassium: 4 mmol/L (ref 3.5–5.5)
Protein, total: 8.3 g/dL — ABNORMAL HIGH (ref 6.4–8.2)
Sodium: 136 mmol/L (ref 136–145)

## 2013-02-19 LAB — CARDIAC PANEL,(CK, CKMB & TROPONIN)
CK - MB: <0.5 ng/ml — ABNORMAL LOW (ref 0.5–3.6)
CK: 80 U/L (ref 26–192)
Troponin-I, QT: <0.02 NG/ML (ref 0.0–0.045)

## 2013-02-19 LAB — EKG, 12 LEAD, INITIAL
Atrial Rate: 108 {beats}/min
Calculated P Axis: 46 degrees
Calculated R Axis: 35 degrees
Calculated T Axis: 33 degrees
P-R Interval: 148 ms
Q-T Interval: 344 ms
QRS Duration: 80 ms
QTC Calculation (Bezet): 460 ms
Ventricular Rate: 108 {beats}/min

## 2013-02-19 LAB — LIPASE: Lipase: 124 U/L (ref 73–393)

## 2013-02-19 LAB — MAGNESIUM: Magnesium: 1.7 mg/dL — ABNORMAL LOW (ref 1.8–2.4)

## 2013-02-19 MED ADMIN — 0.9% sodium chloride infusion: INTRAVENOUS | @ 17:00:00 | NDC 00409798309

## 2013-02-19 MED ADMIN — morphine injection 2 mg: INTRAVENOUS | @ 18:00:00 | NDC 00409189101

## 2013-02-19 MED ADMIN — ondansetron (ZOFRAN) injection 4 mg: INTRAVENOUS | @ 17:00:00 | NDC 55150012502

## 2013-02-19 MED ADMIN — ondansetron (ZOFRAN) injection 4 mg: INTRAVENOUS | @ 18:00:00 | NDC 55150012502

## 2013-02-19 MED ADMIN — lidocaine (XYLOCAINE) 2 % viscous solution 15 mL: OROMUCOSAL | @ 20:00:00 | NDC 17856077502

## 2013-02-19 MED ADMIN — pantoprazole (PROTONIX) injection 40 mg: INTRAVENOUS | @ 17:00:00 | NDC 00008092351

## 2013-02-19 MED ADMIN — sodium chloride 0.9 % bolus infusion 1,000 mL: INTRAVENOUS | @ 17:00:00 | NDC 00409798309

## 2013-02-19 MED ADMIN — maalox/donnatal (GI COCKTAIL COMPOUND) oral liquid: ORAL | @ 20:00:00 | NDC 17856042305

## 2013-02-19 MED ADMIN — magnesium sulfate 2 g/50 ml IVPB (premix or compounded): INTRAVENOUS | @ 20:00:00 | NDC 00409672924

## 2013-02-19 NOTE — ED Notes (Signed)
Crisis at bedside

## 2013-02-19 NOTE — ED Provider Notes (Signed)
HPI Comments: Christy [name] is a 37 y.o. female with a history of asthma, depression, diabetes, HTN, CAD, reflux and diabetic neuropathy who reports to the ED complaining of nausea, vomiting and diarrhea onset this morning.  Patient states that after several episodes of vomiting she started to see some blood streaking.  Patient admits weakness and lightheadedness.  Patient states that she has not had anything to eat or drink today. Patient states that she took a zantac this morning for her symptoms.  Patient states that she also has a history of abdominal pain ongoing for several months radiating into her vagina. No further symptoms expressed.       Patient is a 37 y.o. female presenting with vomiting, diarrhea, and dizziness.   Vomiting   The problem has not changed since onset.Associated symptoms include diarrhea.   Diarrhea   Associated symptoms include vomiting.   Dizziness   Associated symptoms include nausea, vomiting and dizziness.        Past Medical History   Diagnosis Date   ??? Acid indigestion    ??? Asthma    ??? Depression    ??? Diabetes    ??? Hypertension    ??? CAD (coronary artery disease)    ??? 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 Interstitial cystitis    ??? Diabetic neuropathy    ??? Acid reflux    ??? IBS (irritable bowel syndrome)         Past Surgical History   Procedure Laterality Date   ??? Hx cholecystectomy       2008         Family History   Problem Relation Age of Onset   ??? Diabetes Mother    ??? Heart Disease Mother    ??? Diabetes Father    ??? Cancer Sister    ??? Heart Disease Maternal Aunt    ??? Cancer Paternal Aunt         History     Social History   ??? Marital Status: MARRIED     Spouse Name: N/A     Number of Children: N/A   ??? Years of Education: N/A     Occupational History   ??? Not on file.     Social History Main Topics   ??? Smoking status: Current Every Day Smoker -- 0.25 packs/day for 10 years   ??? Smokeless tobacco: Not on file   ??? Alcohol Use: No   ??? Drug Use: No   ??? Sexually Active: Yes --  Female partner(s)     Other Topics Concern   ??? Not on file     Social History Narrative   ??? No narrative on file                  ALLERGIES: Pcn      Review of Systems   Constitutional: Negative.    HENT: Negative.    Eyes: Negative.    Respiratory: Negative.    Cardiovascular: Negative.    Gastrointestinal: Positive for nausea, vomiting and diarrhea.   Endocrine: Negative.    Genitourinary: Negative.    Musculoskeletal: Negative.    Skin: Negative.    Allergic/Immunologic: Negative.    Neurological: Positive for dizziness.   Hematological: Negative.    Psychiatric/Behavioral: Negative.    All other systems reviewed and are negative.        Filed Vitals:    02/19/13 1153   BP: 133/96  Pulse: 123   Temp: 97.9 ??F (36.6 ??C)   Resp: 18   SpO2: 96%            Physical Exam   Constitutional: She is oriented to person, place, and time. She appears well-developed and well-nourished.   obese   HENT:   Head: Normocephalic and atraumatic.   Right Ear: External ear normal.   Left Ear: External ear normal.   Nose: Nose normal.   Mouth/Throat: Oropharynx is clear and moist.   Eyes: Conjunctivae and EOM are normal. Pupils are equal, round, and reactive to light.   Normal conjunctiva, no evidence of palor   Neck: Normal range of motion. Neck supple.   Cardiovascular: Regular rhythm, normal heart sounds and intact distal pulses.    Pulmonary/Chest: Effort normal and breath sounds normal. No respiratory distress. She has no wheezes. She has no rales. She exhibits no tenderness.   Abdominal: Soft. Bowel sounds are normal. She exhibits no distension and no mass. There is no rebound and no guarding.   Mild epigastric tenderness   Genitourinary:     Rectal exam notable for compressible tender hemorrhoid at 3 oclock of anus.  No fissures noted.  No stool in rectum  Mild positive hemoccult   Musculoskeletal: Normal range of motion. She exhibits no edema.   Neurological: She is alert and oriented to person, place, and time.   Skin: Skin is  warm and dry. No rash noted. No erythema.   Psychiatric: She has a normal mood and affect. Her behavior is normal. Judgment normal.        MDM     Differential Diagnosis; Clinical Impression; Plan:     37 year old complaining of nausea, vomiting and diarrhea onset this morning. After several rounds of vomiting, patient noticed blood streaking.  Mallory-weiss tear as a source suspected. Patient with mild tachycardia which is suspected to be a result of dehydration over heavy bleeding. Will hydrate, give protonix, hemoccult by MLP and reevaluate heart rate.     Pt states she has never followed up with [facility] or GI  Amount and/or Complexity of Data Reviewed:   Clinical lab tests:  Ordered and reviewed (  Negative cardiac enzymes  No leukocytosis, 84 segs, no bands  Hyopmagnesemia--given mag sulfate IV  Electrolytes are WNL  No anemia  Lipase is WNL  Urine with bacterial and epithelial cells--no WBC, nitrites or leuks.  Most likely contaminated specimen)  Discussion of test results with the performing providers:  Yes (Discussed with Crisis to evaluate pt who now expresses SI.  Spoke with Annice Pih who will evaluate    Consult with Caryl Comes who will help pt establish follow up care after crisis has cleared.  Discussed with pt who is happy to hear there will be a follow up plan.) (    Pt appears well overall  Afebrile  Note elevated HR.  Pt denies cardiac symptoms.  Most likely secondary to pain.  Will recheck prior to DC.  Marland Kitchen  Physical exam notable for epigastric pain but otherwise unremarkable.  No peritoneal signs    DC with Rx for zofran, mag oxide and vicodin for breakthrough pain.  Life consult will contact for follow up.  Discussed return precautions.  Pt indicates understanding and agrees. )      Procedures    Scribe Attestation:   February 19, 2013 at 12:16 PM - Su Monks scribing for and in the presence of Dr.Cordie Buening I Janey Greaser, MD  Odie Sera, Scribe      Provider Attestation:   I  personally performed the services described in the documentation, reviewed the documentation, as recorded by the scribe in my presence, and it accurately and completely records my words and actions. Renee Rival, MD.  5:15 PM  02/19/2013.    1315--INtroduced self to pt.  Abdominal re-exam with protuberant abdomen with panus.  No point tenderness.  No peritoneal signs.  Reviewed labs with pt.  Pt states pain now at bilateral sides and requests pain medication.  Will order morphine and zofran.  Fluids continue to infuse.     1500--Pt c/o epigastric pain consistent with GERD.  Will give GI cocktail and dispo to home.  GI and PCP referral.  Pt agrees.     1600-Pt has pulled out her IV stating she wants to kill herself.  Pt refuses GI cocktail and magnesium Told pt that we need to call crisis for evaluation and encouraged pt to stay.   Will also call life coach    I was personally available for consultation in the emergency department.  I have reviewed the chart prior to the patient being discharged and agree with the documentation recorded by the [facility], including the assessment, treatment plan, and disposition.  Renee Rival, MD

## 2013-02-19 NOTE — ED Notes (Signed)
Pt reports feeling suicidal and irate and states "I feel like I want to hurt myself after she came in and told me that my labs are fine and theres nothing going on, I feel like you all just think theres nothing wrong with me." Pt pulled out iv and states "I don't want any of these meds and I'm not seeing crisis because I don't have to stay if I don't have a plan." PA informed and charge nurse informed.

## 2013-02-19 NOTE — Progress Notes (Signed)
Pt left without Rx for Vicodin.  Called to listed home number as mobile was not inservice. Advised pt that Rx would be in envelope at registration and ID needed to be shown if wanted to pick up Rx.

## 2013-02-19 NOTE — ED Notes (Signed)
Patient armband removed and shredded  I have reviewed discharge instructions with the patient.  The patient verbalized understanding.

## 2013-02-19 NOTE — Other (Signed)
Comprehensive Assessment Form Part 1      Section l - Integrated Summary    Patient is a 37 year old female who presented to the ER today complaining of nausea, vomiting, and diarrhea.  Crisis was called after patient became upset, pulled out her IV, and stated she felt like she should just kill herself. When I initially approached patient, she informed me that she did not want to speak with anyone unless I was from administration.  I explained to patient my role in the hospital and asked if she would at least tell me what was going on and how we could help.  Patient informed me that she was upset about how she was treated by her nurse.  She stated she was angry, agitated and insulted.  Patient informed crisis worker that her nurse has been rude to her since she came in.  She states that since she has been in her room she has been able to hear her talking about her in the hallway.  Patient was very tearful as she stated that the nurse would not listen to anything she was trying to tell her about how she was feeing.  Patient admits to pulling out her IV, stating "I was so frustrated by this women's attitude that I just wanted to leave."  Patient informed crisis worker that she did say she felt like dying but only because the ER physician had just informed her there was nothing else she could do for her and she still felt really bad. She stated the nurse told her she was just going to be sent to the other side, meaning Behavioral Medicine.  Patient also states she was threatened with calling the police to make sure she stays, which angered her even more.      Section ll - Disposition    Patient is not meeting criteria for inpatient care at this time.  Patient is not psychotic or delusional, nor is she a danger to herself or others.  Patient is upset and frustrated.  She was provided with reading material  and outpatient information with [facility].  Crisis worker provided charge nurse with patient's  request to speak with administration.          JACQUELINE N MACKEY

## 2013-02-19 NOTE — ED Notes (Signed)
Crisis at bedside. Pt is calm and cooperative.

## 2013-02-20 NOTE — ED Notes (Signed)
Emergency provider consulted with the care management to connect patient with primary care provider, patient will be directed to [facility], and patient will receive the verification of income for sliding fee eligibility form, for medical and dental services  facility has a case worker to give  patient the opportunity to discuss insurance option in the Marketplace,

## 2013-02-21 NOTE — Progress Notes (Signed)
I was personally available for consultation in the emergency department.  I have reviewed the chart prior to the patient being discharged and agree with the documentation recorded by the MLP, including the assessment, treatment plan, and disposition.  Hetty Linhart Isaac Iqra Rotundo, MD

## 2013-02-24 LAB — CBC WITH AUTOMATED DIFF
BASOPHILS: 0 % (ref 0–3)
EOSINOPHILS: 2 % (ref 0–5)
HCT: 39.4 % (ref 37.0–50.0)
HGB: 12.9 gm/dl — ABNORMAL LOW (ref 13.0–17.2)
LYMPHOCYTES: 30 % (ref 28–48)
MCH: 28.3 pg (ref 25.4–34.6)
MCHC: 32.9 gm/dl (ref 30.0–36.0)
MCV: 86.3 fL (ref 80.0–98.0)
MONOCYTES: 5 % (ref 1–13)
MPV: 10.7 fL — ABNORMAL HIGH (ref 6.0–10.0)
NEUTROPHILS: 62 % (ref 34–64)
NRBC: 0 (ref 0–0)
PLATELET: 261 10*3/uL (ref 140–450)
RBC: 4.57 M/uL (ref 3.60–5.20)
RDW: 13.2 % (ref 11.5–14.0)
WBC: 6.5 10*3/uL (ref 4.0–11.0)

## 2013-02-24 LAB — POC URINE MACROSCOPIC
Blood: NEGATIVE
Glucose: NEGATIVE mg/dl
Ketone: NEGATIVE mg/dl
Leukocyte Esterase: NEGATIVE
Nitrites: NEGATIVE
Protein: 30 mg/dl — AB
Specific gravity: >=1.03 (ref 1.005–1.030)
Urobilinogen: 0.2 EU/dl (ref 0.0–1.0)
pH (UA): 5.5 (ref 5–9)

## 2013-02-24 LAB — METABOLIC PANEL, COMPREHENSIVE
ALT (SGPT): 59 U/L (ref 12–78)
AST (SGOT): 32 U/L (ref 15–37)
Albumin: 3.5 gm/dl (ref 3.4–5.0)
Alk. phosphatase: 86 U/L (ref 50–136)
BUN: 8 mg/dl (ref 7–25)
Bilirubin, total: <0.1 mg/dl — ABNORMAL LOW (ref 0.2–1.0)
CO2: 21 mEq/L (ref 21–32)
Calcium: 8.6 mg/dl (ref 8.5–10.1)
Chloride: 107 mEq/L (ref 98–107)
Creatinine: 0.7 mg/dl (ref 0.6–1.3)
GFR est AA: >60
GFR est non-AA: >60
Glucose: 125 mg/dl — ABNORMAL HIGH (ref 74–106)
Potassium: 3.7 mEq/L (ref 3.5–5.1)
Protein, total: 8.4 gm/dl — ABNORMAL HIGH (ref 6.4–8.2)
Sodium: 136 mEq/L (ref 136–145)

## 2013-02-24 LAB — C. DIFFICILE/EPI PCR: C. diff toxin by PCR: NEGATIVE

## 2013-02-24 LAB — POC HCG,URINE: HCG urine, QL: NEGATIVE

## 2013-02-24 LAB — LIPASE: Lipase: 290 U/L (ref 73–393)

## 2013-02-24 LAB — LACTIC ACID: Lactic Acid: 1.1 mmol/L (ref 0.4–2.0)

## 2013-02-24 NOTE — ED Provider Notes (Signed)
[facility]  EMERGENCY DEPARTMENT TREATMENT REPORT  NAME:  Christy [name]  SEX:   F  ADMIT: 02/24/2013  DOB:   1976-01-19  MR#    [id number]  ROOM:    TIME SEEN: 11 01 AM  ACCT#  [id number]        DATE AND TIME OF EVALUATION:  Monday, 02/24/2013, at 0981.       CHIEF COMPLAINT:  Nausea, vomiting, diarrhea.    HISTORY OF PRESENT ILLNESS:  A 37 year old female who says that she has had a week of nausea, vomiting and   diarrhea that has just been getting worse.  She was seen at [facility] and was told that she had a GI virus.  She was unhappy with the   service there and said that she was not getting better, so she decided to come   to the Emergency Department today.  She says the diarrhea is so bad that it   has actually turned clear with little chunks of yellow something in it.  Says   that she actually has anal leakage at night.  The patient says that the kids   also with similar type symptoms, and she has generalized abdominal pain as   well as bilateral flank pain.  Denies any dysuria.  Has a history of   interstitial cystitis.  Denies chest pain or shortness of breath.      REVIEW OF SYSTEMS:  CONSTITUTIONAL:  No fever or chills.  EYES:  No visual symptoms.  ENT:  No upper respiratory symptoms.  HEMATOLOGIC:  No excessive bruising.  RESPIRATORY:  No cough or trouble breathing.  CARDIOVASCULAR:  No chest pain.  GASTROINTESTINAL:  Nausea, vomiting, diarrhea, abdominal pain.  GENITOURINARY:  No dysuria.  MUSCULOSKELETAL:  No joint pain or swelling.  INTEGUMENTARY:  No rashes.  NEUROLOGIC:  No headaches.    PAST MEDICAL HISTORY:  Sleep apnea, interstitial cystitis, peripheral neuropathy, cardiovascular   disease, diabetes, hyperlipidemia, reflux.    PAST SURGICAL HISTORY:    Laparoscopy for endometriosis, cholecystectomy, hysterectomy.    PSYCHIATRIC HISTORY:  Bipolar disease.    SOCIAL HISTORY:  The patient smokes cigarettes, denies alcohol abuse.    PHYSICAL EXAMINATION:   GENERAL APPEARANCE:  Obese female seated on exam table in no acute distress.  VITAL SIGNS:  Blood pressure 143/98, pulse 104, respirations 20, temperature   98.8, O2 saturation 100% on room air.  EYES:  Conjunctivae clear, lids normal.  Pupils equal, symmetrical, and   normally reactive.   ENT:  Mouth shows buccal mucosa somewhat dry but without lesion.  NECK:  Supple, nontender to palpation.  LYMPHATICS:  No cervical or submandibular lymphadenopathy palpated.   RESPIRATORY:  Clear and equal breath sounds.  No respiratory distress,   tachypnea, or accessory muscle use.   CARDIOVASCULAR:   Heart regular, without murmurs, gallops, rubs, or thrills.   GASTROINTESTINAL:  Abdomen is soft and nontender without complaint of pain to   palpation.  No hepatomegaly or splenomegaly.    GENITOURINARY AND RECTAL:  She has a small hemorrhoid.  Sphincter tone was   normal.  Stool is brown and guaiac was negative.  MUSCULOSKELETAL:  No clubbing or deformities.  Nails no peripheral edema.  SKIN:  Warm and dry without rashes.  NEUROLOGIC:  The patient alert and oriented and appropriate in conversation.    CONTINUATION OF SARAH GREGORY, PA-C:    ASSESSMENT MANAGEMENT PLAN:  This 37 year old female has had a week of nausea, vomiting, diarrhea,  has been   seen at [facility] for the same thing, but felt like she did not get   the appropriate medications and she is still having diarrhea.  The patient was   taking Bentyl that she got from that hospital and says that it is of no help.   The patient was told that she likely had a viral gastroenteritis but just in   case that she has infectious gastroenteritis, we will do some blood work and   stool cultures.  The patient also complaining of bilateral flank pain.  We   will get a urine to rule out blood for likely kidney stone and for urinary   tract infection.    DIAGNOSTIC TEST RESULTS:    Lactic acid negative.  CMP normal.  Lipase normal.  CBC normal with hemoglobin    slightly low at 12.9.  Urine normal.  Urine pregnancy negative.    COURSE IN THE EMERGENCY DEPARTMENT:  The patient given IV fluids for hydration, Pepcid and Zofran for nausea and   vomiting and epigastric pain.  The patient said that was of no help.  The   patient given 4 mg of IV morphine for pain which the patient said was no help.    Was given Carafate oral solution.  The patient believes that her epigastric   pain is related to her history of reflux.  The patient ready to leave the ED.    Will be notified of stool culture results.  Given a work note.    DIAGNOSES:  1.  Diarrhea.  2.  Acute gastroenteritis.  3.  Nausea and vomiting.      The patient given a prescription for Zofran and Pepcid.    DISPOSITION:  The patient dispositioned home in stable condition.  Return to ED if new or   worsening symptoms arise. Follow up with primary care physician.      The patient was personally evaluated by myself and Dr.  Arvella Merles who agrees with   the above assessment and plan.      ___________________  Smitty Cords MD  Dictated By: Thea Silversmith, PA-C    My signature above authenticates this document and my orders, the final   diagnosis (es), discharge prescription (s), and instructions in the [facility]   Pulsecheck record.  [facility]  D:02/24/2013  T: 02/24/2013 16:50:28  [id number]  Authenticated by Smitty Cords, M.D. On 02/25/2013 10:48:04 PM

## 2013-08-05 NOTE — Telephone Encounter (Signed)
Patient has transferred from [facility] to:  [facility]  [address]  [place] [address]  [facility] [phone]  Fax [phone]    Records request has been faxed to [employer].

## 2014-11-30 ENCOUNTER — Encounter (HOSPITAL_BASED_OUTPATIENT_CLINIC_OR_DEPARTMENT_OTHER): Payer: Self-pay | Admitting: *Deleted

## 2014-11-30 ENCOUNTER — Emergency Department (HOSPITAL_BASED_OUTPATIENT_CLINIC_OR_DEPARTMENT_OTHER)
Admission: EM | Admit: 2014-11-30 | Discharge: 2014-11-30 | Disposition: A | Discharge status: Home / Self Care | Payer: Medicaid Other | Attending: Emergency Medicine | Admitting: Emergency Medicine

## 2014-11-30 DIAGNOSIS — M25561 Pain in right knee: Secondary | ICD-10-CM | POA: Diagnosis not present

## 2014-11-30 DIAGNOSIS — Z79899 Other long term (current) drug therapy: Secondary | ICD-10-CM | POA: Insufficient documentation

## 2014-11-30 DIAGNOSIS — E119 Type 2 diabetes mellitus without complications: Secondary | ICD-10-CM | POA: Diagnosis not present

## 2014-11-30 DIAGNOSIS — Z760 Encounter for issue of repeat prescription: Secondary | ICD-10-CM | POA: Insufficient documentation

## 2014-11-30 DIAGNOSIS — M7989 Other specified soft tissue disorders: Secondary | ICD-10-CM | POA: Diagnosis not present

## 2014-11-30 DIAGNOSIS — I10 Essential (primary) hypertension: Secondary | ICD-10-CM | POA: Insufficient documentation

## 2014-11-30 DIAGNOSIS — M792 Neuralgia and neuritis, unspecified: Secondary | ICD-10-CM | POA: Diagnosis not present

## 2014-11-30 DIAGNOSIS — R102 Pelvic and perineal pain: Secondary | ICD-10-CM | POA: Insufficient documentation

## 2014-11-30 DIAGNOSIS — G8929 Other chronic pain: Secondary | ICD-10-CM | POA: Insufficient documentation

## 2014-11-30 DIAGNOSIS — M79604 Pain in right leg: Secondary | ICD-10-CM

## 2014-11-30 DIAGNOSIS — Z720 Tobacco use: Secondary | ICD-10-CM | POA: Diagnosis not present

## 2014-11-30 DIAGNOSIS — K219 Gastro-esophageal reflux disease without esophagitis: Secondary | ICD-10-CM | POA: Insufficient documentation

## 2014-11-30 HISTORY — DX: Irritable bowel syndrome without diarrhea: K58.9

## 2014-11-30 HISTORY — DX: Atherosclerotic heart disease of native coronary artery without angina pectoris: I25.10

## 2014-11-30 HISTORY — DX: Neuralgia and neuritis, unspecified: M79.2

## 2014-11-30 HISTORY — DX: Essential (primary) hypertension: I10

## 2014-11-30 HISTORY — DX: Type 2 diabetes mellitus without complications: E11.9

## 2014-11-30 HISTORY — DX: Gastro-esophageal reflux disease without esophagitis: K21.9

## 2014-11-30 LAB — CBC WITH DIFFERENTIAL/PLATELET
BASOS ABS: 0 10*3/uL (ref 0.0–0.1)
Basophils Relative: 0 % (ref 0–1)
Eosinophils Absolute: 0.3 10*3/uL (ref 0.0–0.7)
Eosinophils Relative: 2 % (ref 0–5)
HEMATOCRIT: 39.7 % (ref 36.0–46.0)
HEMOGLOBIN: 13.1 g/dL (ref 12.0–15.0)
LYMPHS PCT: 33 % (ref 12–46)
Lymphs Abs: 3.6 10*3/uL (ref 0.7–4.0)
MCH: 28.4 pg (ref 26.0–34.0)
MCHC: 33 g/dL (ref 30.0–36.0)
MCV: 86.1 fL (ref 78.0–100.0)
MONOS PCT: 3 % (ref 3–12)
Monocytes Absolute: 0.4 10*3/uL (ref 0.1–1.0)
NEUTROS ABS: 6.9 10*3/uL (ref 1.7–7.7)
Neutrophils Relative %: 62 % (ref 43–77)
PLATELETS: 291 10*3/uL (ref 150–400)
RBC: 4.61 MIL/uL (ref 3.87–5.11)
RDW: 12.7 % (ref 11.5–15.5)
WBC: 11.1 10*3/uL — AB (ref 4.0–10.5)

## 2014-11-30 LAB — COMPREHENSIVE METABOLIC PANEL
ALBUMIN: 4 g/dL (ref 3.5–5.2)
ALK PHOS: 68 U/L (ref 39–117)
ALT: 22 U/L (ref 0–35)
AST: 16 U/L (ref 0–37)
Anion gap: 6 (ref 5–15)
BUN: 10 mg/dL (ref 6–23)
CHLORIDE: 105 mmol/L (ref 96–112)
CO2: 25 mmol/L (ref 19–32)
Calcium: 9 mg/dL (ref 8.4–10.5)
Creatinine, Ser: 0.65 mg/dL (ref 0.50–1.10)
GFR calc Af Amer: >90 mL/min (ref >90)
GLUCOSE: 179 mg/dL — AB (ref 70–99)
POTASSIUM: 3.9 mmol/L (ref 3.5–5.1)
SODIUM: 136 mmol/L (ref 135–145)
Total Bilirubin: 0.4 mg/dL (ref 0.3–1.2)
Total Protein: 8 g/dL (ref 6.0–8.3)

## 2014-11-30 LAB — D-DIMER, QUANTITATIVE: D-Dimer, Quant: <0.27 ug/mL-FEU (ref 0.00–0.48)

## 2014-11-30 LAB — CBG MONITORING, ED: Glucose-Capillary: 174 mg/dL — ABNORMAL HIGH (ref 70–99)

## 2014-11-30 MED ORDER — SITAGLIPTIN PHOSPHATE 100 MG PO TABS: 100.0000 mg | ORAL_TABLET | Freq: Every day | ORAL | Status: AC

## 2014-11-30 MED ORDER — METFORMIN HCL 1000 MG PO TABS: 1000.0000 mg | ORAL_TABLET | Freq: Two times a day (BID) | ORAL | Status: AC

## 2014-11-30 MED ORDER — HYDROCHLOROTHIAZIDE 25 MG PO TABS: 25.0000 mg | ORAL_TABLET | Freq: Every day | ORAL | Status: AC

## 2014-11-30 MED ORDER — OXYCODONE-ACETAMINOPHEN 5-325 MG PO TABS: 1.0000 | ORAL_TABLET | Freq: Once | ORAL | Status: DC

## 2014-11-30 MED ORDER — AMLODIPINE BESYLATE 5 MG PO TABS: 5.0000 mg | ORAL_TABLET | Freq: Every day | ORAL | Status: AC

## 2014-11-30 MED ORDER — OXYCODONE-ACETAMINOPHEN 5-325 MG PO TABS: 1.0000 | ORAL_TABLET | Freq: Four times a day (QID) | ORAL | Status: DC | PRN

## 2014-11-30 MED ORDER — HYDROCHLOROTHIAZIDE 25 MG PO TABS: 25.0000 mg | ORAL_TABLET | Freq: Once | ORAL | Status: DC

## 2014-11-30 NOTE — ED Notes (Signed)
Pt left around 2100 stating to another nurse that she was leaving.  She felt like the PA was discriminating against her because he wouldn't order an ultrasound.  She also had several other problems.  One was with her Medicaid and not getting some of her meds.  Left

## 2014-11-30 NOTE — ED Provider Notes (Signed)
CSN: [id number]     Arrival date & time 11/30/14  1856 History   First MD Initiated Contact with Patient 11/30/14 1954     Chief Complaint  Patient presents with  . Abscess     (Consider location/radiation/quality/duration/timing/severity/associated sxs/prior Treatment) HPI Comments: Patient with history of diabetes, hypertension, cyst behind her right knee -- presents with multiple complaints. Patient states that her right knee has been more swollen and tender over the past 1 week. It feels the same as previous Baker's cyst. She says that her leg intermittently feels warm. She denies color change. She is able to ambulate at baseline. Patient was not going to come to the hospital today for this however her blood sugar was elevated in the 200s which scared her so she presents for evaluation. Patient notes that she has been out of her medications for several months. She does not currently have a PCP due to Medicaid problem. She has not been taking any of her diabetes or blood pressure medications. No treatments prior to arrival for her knee problems. No history of blood clot.   Patient also complains of intermittent pelvic pain which has been occurring for months to years. No vaginal bleeding, discharge, or urinary symptoms. Patient has been worked up for this in the past and states that she was diagnosed with ovarian cysts.  The history is provided by the patient.    Past Medical History  Diagnosis Date  . Diabetes mellitus without complication   . Hypertension   . Neuropathic pain   . GERD (gastroesophageal reflux disease)   . IBS (irritable bowel syndrome)   . Coronary artery disease    Past Surgical History  Procedure Laterality Date  . Cholecystectomy    . Abdominal hysterectomy     No family history on file. History  Substance Use Topics  . Smoking status: Current Every Day Smoker -- 0.50 packs/day    Types: Cigarettes  . Smokeless tobacco: Not on file  . Alcohol Use: No    OB History    No data available     Review of Systems  Constitutional: Negative for fever and fatigue.  HENT: Negative for rhinorrhea and sore throat.   Eyes: Negative for redness.  Respiratory: Negative for cough.   Cardiovascular: Positive for leg swelling. Negative for chest pain.  Gastrointestinal: Negative for nausea, vomiting, abdominal pain and diarrhea.  Genitourinary: Positive for pelvic pain. Negative for dysuria, vaginal bleeding and vaginal discharge.  Musculoskeletal: Positive for myalgias. Negative for arthralgias and gait problem.  Skin: Negative for rash.  Neurological: Negative for headaches.    Allergies  Vicodin  Home Medications   Prior to Admission medications   Medication Sig Start Date End Date Taking? Authorizing Provider  amLODipine (NORVASC) 5 MG tablet Take 5 mg by mouth daily.   Yes Historical Provider, MD  dicyclomine (BENTYL) 20 MG tablet Take 20 mg by mouth every 6 (six) hours.   Yes Historical Provider, MD  gabapentin (NEURONTIN) 300 MG capsule Take 300 mg by mouth 3 (three) times daily.   Yes Historical Provider, MD  HYDROCHLOROTHIAZIDE PO Take by mouth.   Yes Historical Provider, MD  metFORMIN (GLUCOPHAGE) 1000 MG tablet Take 1,000 mg by mouth 2 (two) times daily with a meal.   Yes Historical Provider, MD  OMEPRAZOLE PO Take by mouth.   Yes Historical Provider, MD  sitaGLIPtin (JANUVIA) 100 MG tablet Take 100 mg by mouth daily.   Yes Historical Provider, MD   BP 167/118 mmHg  Pulse 107  Temp(Src) 98.5 F (36.9 C) (Oral)  Resp 20  Ht  (1.549 m)  Wt 240 lb (108.863 kg)  BMI 45.37 kg/m2  SpO2 99%   Physical Exam  Constitutional: She appears well-developed and well-nourished.  HENT:  Head: Normocephalic and atraumatic.  Eyes: Conjunctivae are normal. Right eye exhibits no discharge. Left eye exhibits no discharge.  Neck: Normal range of motion. Neck supple.  Cardiovascular: Normal rate, regular rhythm and normal heart sounds.    Pulmonary/Chest: Effort normal and breath sounds normal. No respiratory distress. She has no wheezes. She has no rales.  Abdominal: Soft. Bowel sounds are normal. There is no tenderness. There is no rebound and no guarding.  Musculoskeletal: She exhibits no edema.       Right hip: Normal.       Left hip: Normal.       Right knee: Tenderness found.       Left knee: Normal.       Right ankle: Normal.       Left ankle: Normal.       Right upper leg: Normal.       Left upper leg: Normal.       Right lower leg: Normal.       Left lower leg: Normal.       Legs:      Right foot: Normal.       Left foot: Normal.  Neurological: She is alert.  Skin: Skin is warm and dry. No rash noted. No erythema.  Psychiatric: She has a normal mood and affect.  Nursing note and vitals reviewed.   ED Course  Procedures (including critical care time) Labs Review Labs Reviewed  CBC WITH DIFFERENTIAL/PLATELET - Abnormal; Notable for the following:    WBC 11.1 (*)    All other components within normal limits  COMPREHENSIVE METABOLIC PANEL - Abnormal; Notable for the following:    Glucose, Bld 179 (*)    All other components within normal limits  CBG MONITORING, ED - Abnormal; Notable for the following:    Glucose-Capillary 174 (*)    All other components within normal limits  D-DIMER, QUANTITATIVE    Imaging Review No results found.   EKG Interpretation None       8:43 PM Patient seen and examined. We discussed results. I spent time with patient explaining what testing is indicated at current time and why x-ray of her leg is not indicated with no injury, full ROM, etc. We discussed d-dimer test and how this makes DVT very unlikely. I will give PO percocet in ED to treat her pain. I will give her a dose of HCTZ here to treat her BP.    I offered to refill BP meds and DM meds so that she has these for next month so that she can get her Medicaid situation figured out and see PCP again.   Patient's  attention then turned to her pelvic pain which is acute on chronic. She asks why she is not getting an evaluation for this. I offered to perform a pelvic exam and urinalysis. She would like x-rays for her internal problems. I told her that if her exam warranted an ultrasound we may consider this, but at this time she has no symptoms which indicate a CT scan. Patient became angry and asked if her insurance is the reason we would not do further testing. I explained that this is not factored into her decision. Patient then stated that she would  rather not have any further evaluation, that she would like her prescriptions and pain medication and would like to leave. Pt discharged with PCP f/u, [facility] referral for further evaluation of her pelvic pain.   Vital signs reviewed and are as follows: BP 167/118 mmHg  Pulse 107  Temp(Src) 98.5 F (36.9 C) (Oral)  Resp 20  Ht  (1.549 m)  Wt 240 lb (108.863 kg)  BMI 45.37 kg/m2  SpO2 99%    MDM   Final diagnoses:  Pain of right lower extremity  Encounter for medication refill   Knee pain: No mechanism to suspect fx. D-dimer neg, do not suspect DVT. No sign of infection. Pain from previous Baker's cyst is most likely. No imaging indicated.  HTN/DM/med compliance: No signs of end-organ damage. No DKA. Rx refills given. Encouraged follow-up.   Pelvic pain: This is chronic. No acute symptoms. Offered pelvic exam and UA. Patient declines and just wants to go home. No focal RLQ tenderness at this time to suggest appendicitis. I have low suspicion for intermittent torsion given chronicity and severity of pain. Cannot r/o PID but patient denies vaginal d/c.     Renne Crigler, PA-C 12/01/14 1610  Arby Barrette, MD 12/06/14 [phone]

## 2014-11-30 NOTE — Discharge Instructions (Signed)
Please read and follow all provided instructions.  Your diagnoses today include:  1. Pain of right lower extremity   2. Encounter for medication refill     Tests performed today include:  Blood counts and electrolytes - normal  Blood test for blood clot - normal  Blood sugar-slightly high  Vital signs. See below for your results today.   Medications prescribed:   Percocet (oxycodone/acetaminophen) - narcotic pain medication  DO NOT drive or perform any activities that require you to be awake and alert because this medicine can make you drowsy. BE VERY CAREFUL not to take multiple medicines containing Tylenol (also called acetaminophen). Doing so can lead to an overdose which can damage your liver and cause liver failure and possibly death.  Take any prescribed medications only as directed.  Home care instructions:  Follow any educational materials contained in this packet.  BE VERY CAREFUL not to take multiple medicines containing Tylenol (also called acetaminophen). Doing so can lead to an overdose which can damage your liver and cause liver failure and possibly death.   Follow-up instructions: Please follow-up with your primary care provider in the next 3 days for further evaluation of your symptoms.   Return instructions:   Please return to the Emergency Department if you experience worsening symptoms.   Return with worsening abdominal or pelvic pain or decide if you want further evaluation.  Return with fever, nausea, vomiting or diarrhea.  Return with redness or worsening pain in your leg.  Please return if you have any other emergent concerns.  Additional Information:  Your vital signs today were: BP 167/118 mmHg   Pulse 107   Temp(Src) 98.5 F (36.9 C) (Oral)   Resp 20   Ht 5\' 1"  (1.549 m)   Wt 240 lb (108.863 kg)   BMI 45.37 kg/m2   SpO2 99% If your blood pressure (BP) was elevated above 135/85 this visit, please have this repeated by your doctor within one  month. --------------

## 2014-11-30 NOTE — ED Notes (Signed)
Swelling behind her right knee. Her leg feels hot. She can't get her sugar to come down since she stopped taking some of her medications for her diabetes.

## 2015-09-10 ENCOUNTER — Encounter (HOSPITAL_BASED_OUTPATIENT_CLINIC_OR_DEPARTMENT_OTHER): Payer: Self-pay | Admitting: Emergency Medicine

## 2015-09-10 ENCOUNTER — Emergency Department (HOSPITAL_BASED_OUTPATIENT_CLINIC_OR_DEPARTMENT_OTHER)
Admission: EM | Admit: 2015-09-10 | Discharge: 2015-09-10 | Disposition: A | Discharge status: Home / Self Care | Payer: Medicaid Other | Attending: Emergency Medicine | Admitting: Emergency Medicine

## 2015-09-10 DIAGNOSIS — E119 Type 2 diabetes mellitus without complications: Secondary | ICD-10-CM | POA: Insufficient documentation

## 2015-09-10 DIAGNOSIS — N611 Abscess of the breast and nipple: Secondary | ICD-10-CM | POA: Insufficient documentation

## 2015-09-10 DIAGNOSIS — Z791 Long term (current) use of non-steroidal anti-inflammatories (NSAID): Secondary | ICD-10-CM | POA: Insufficient documentation

## 2015-09-10 DIAGNOSIS — K219 Gastro-esophageal reflux disease without esophagitis: Secondary | ICD-10-CM | POA: Insufficient documentation

## 2015-09-10 DIAGNOSIS — Z7984 Long term (current) use of oral hypoglycemic drugs: Secondary | ICD-10-CM | POA: Insufficient documentation

## 2015-09-10 DIAGNOSIS — Z79899 Other long term (current) drug therapy: Secondary | ICD-10-CM | POA: Insufficient documentation

## 2015-09-10 DIAGNOSIS — Z720 Tobacco use: Secondary | ICD-10-CM | POA: Insufficient documentation

## 2015-09-10 DIAGNOSIS — M792 Neuralgia and neuritis, unspecified: Secondary | ICD-10-CM | POA: Insufficient documentation

## 2015-09-10 DIAGNOSIS — I10 Essential (primary) hypertension: Secondary | ICD-10-CM | POA: Insufficient documentation

## 2015-09-10 DIAGNOSIS — I2510 Atherosclerotic heart disease of native coronary artery without angina pectoris: Secondary | ICD-10-CM | POA: Insufficient documentation

## 2015-09-10 LAB — CBG MONITORING, ED: Glucose-Capillary: 228 mg/dL — ABNORMAL HIGH (ref 65–99)

## 2015-09-10 MED ORDER — SULFAMETHOXAZOLE-TRIMETHOPRIM 800-160 MG PO TABS: 1.0000 | ORAL_TABLET | Freq: Two times a day (BID) | ORAL | Status: DC

## 2015-09-10 MED ORDER — SULFAMETHOXAZOLE-TRIMETHOPRIM 800-160 MG PO TABS
1.0000 | ORAL_TABLET | Freq: Once | ORAL | Status: AC
  Administered 2015-09-10: 1 via ORAL
  Filled 2015-09-10: qty 1

## 2015-09-10 MED ORDER — IBUPROFEN 800 MG PO TABS
800.0000 mg | ORAL_TABLET | Freq: Once | ORAL | Status: AC
  Administered 2015-09-10: 800 mg via ORAL
  Filled 2015-09-10: qty 1

## 2015-09-10 MED ORDER — OXYCODONE-ACETAMINOPHEN 5-325 MG PO TABS: ORAL_TABLET | ORAL | Status: DC

## 2015-09-10 NOTE — ED Notes (Signed)
Medication list updated with patient-

## 2015-09-10 NOTE — ED Notes (Signed)
Pa  at bedside. 

## 2015-09-10 NOTE — Discharge Instructions (Signed)
Wash the affected area with soap and water and apply a thin layer of topical antibiotic ointment. Do this every 12 hours.   Do not use rubbing alcohol or hydrogen peroxide.                        Look for worsening signs of infection: if you see redness, if the area becomes warm, if pain increases sharply, there is discharge (pus), if red streaks appear or you develop fever or vomiting, RETURN immediately to the Emergency Department  for a recheck.   Please follow with your primary care doctor in the next 2 days for a check-up. They must obtain records for further management.   Do not hesitate to return to the Emergency Department for any new, worsening or concerning symptoms.

## 2015-09-10 NOTE — ED Provider Notes (Signed)
CSN: [id number]     Arrival date & time 09/10/15  1127 History   First MD Initiated Contact with Patient 09/10/15 1307     Chief Complaint  Patient presents with  . Abscess     (Consider location/radiation/quality/duration/timing/severity/associated sxs/prior Treatment) HPI   Blood pressure 135/96, pulse 107, temperature 98.6 F (37 C), temperature source Oral, resp. rate 18, height 5\' 1"  (1.549 m), weight 232 lb (105.235 kg), SpO2 98 %.  [place] Highlandawana M Oommen is a 39 y.o. female complaining of abscess to right breast onset 2 days ago, actively draining purulent material. Patient states that her glucose has been elevated at home ranging between 170 12/01/2018 home. Patient denies fever, chills, nausea, vomiting. States she had a similar prior episode approximately one year ago. She rates her pain as moderate, 5 out of 10 in irritated with palpation. No family history of breast cancer.  Past Medical History  Diagnosis Date  . Diabetes mellitus without complication (HCC)   . Hypertension   . Neuropathic pain   . GERD (gastroesophageal reflux disease)   . IBS (irritable bowel syndrome)   . Coronary artery disease    Past Surgical History  Procedure Laterality Date  . Cholecystectomy    . Abdominal hysterectomy     History reviewed. No pertinent family history. Social History  Substance Use Topics  . Smoking status: Current Every Day Smoker -- 0.50 packs/day    Types: Cigarettes  . Smokeless tobacco: None  . Alcohol Use: No   OB History    No data available     Review of Systems  10 systems reviewed and found to be negative, except as noted in the HPI.   Allergies  Vicodin  Home Medications   Prior to Admission medications   Medication Sig Start Date End Date Taking? Authorizing Provider  albuterol (PROVENTIL HFA;VENTOLIN HFA) 108 (90 BASE) MCG/ACT inhaler Inhale 2 puffs into the lungs every 6 (six) hours as needed for wheezing or shortness of breath.   Yes Historical  Provider, MD  amLODipine (NORVASC) 5 MG tablet Take 1 tablet (5 mg total) by mouth daily. 11/30/14  Yes Renne CriglerJoshua Geiple, PA-C  dicyclomine (BENTYL) 20 MG tablet Take 20 mg by mouth every 6 (six) hours.   Yes Historical Provider, MD  gabapentin (NEURONTIN) 300 MG capsule Take 300 mg by mouth 3 (three) times daily.   Yes Historical Provider, MD  hydrochlorothiazide (HYDRODIURIL) 25 MG tablet Take 1 tablet (25 mg total) by mouth daily. 11/30/14  Yes Renne CriglerJoshua Geiple, PA-C  labetalol (NORMODYNE) 100 MG tablet Take 100 mg by mouth 2 (two) times daily.   Yes Historical Provider, MD  losartan (COZAAR) 50 MG tablet Take 50 mg by mouth daily.   Yes Historical Provider, MD  meloxicam (MOBIC) 15 MG tablet Take 15 mg by mouth daily.   Yes Historical Provider, MD  metFORMIN (GLUCOPHAGE) 1000 MG tablet Take 1 tablet (1,000 mg total) by mouth 2 (two) times daily with a meal. 11/30/14  Yes Renne CriglerJoshua Geiple, PA-C  pravastatin (PRAVACHOL) 40 MG tablet Take 40 mg by mouth daily.   Yes Historical Provider, MD  sertraline (ZOLOFT) 25 MG tablet Take 25 mg by mouth daily.   Yes Historical Provider, MD  sitaGLIPtin (JANUVIA) 100 MG tablet Take 1 tablet (100 mg total) by mouth daily. 11/30/14  Yes Renne CriglerJoshua Geiple, PA-C  OMEPRAZOLE PO Take by mouth.    Historical Provider, MD  oxyCODONE-acetaminophen (PERCOCET/ROXICET) 5-325 MG tablet 1 to 2 tabs PO q6hrs  PRN for  pain 09/10/15   Anthonyjames Bargar, PA-C  sulfamethoxazole-trimethoprim (BACTRIM DS,SEPTRA DS) 800-160 MG tablet Take 1 tablet by mouth 2 (two) times daily. 09/10/15   Cynthis Purington, PA-C   BP 142/98 mmHg  Pulse 106  Temp(Src) 98.6 F (37 C) (Oral)  Resp 18  Ht  (1.549 m)  Wt 232 lb (105.235 kg)  BMI 43.86 kg/m2  SpO2 100% Physical Exam  Constitutional: She is oriented to person, place, and time. She appears well-developed and well-nourished. No distress.  HENT:  Head: Normocephalic and atraumatic.  Mouth/Throat: Oropharynx is clear and moist.  Eyes: Conjunctivae  and EOM are normal. Pupils are equal, round, and reactive to light.  Neck: Normal range of motion.  Cardiovascular: Regular rhythm and intact distal pulses.   Very mild tachycardia  Pulmonary/Chest: Effort normal and breath sounds normal. No stridor.    Abdominal: Soft. There is no tenderness.  Musculoskeletal: Normal range of motion.  Neurological: She is alert and oriented to person, place, and time.  Skin: She is not diaphoretic.  Psychiatric: She has a normal mood and affect.  Nursing note and vitals reviewed.   ED Course  Procedures (including critical care time) Labs Review Labs Reviewed  CBG MONITORING, ED - Abnormal; Notable for the following:    Glucose-Capillary 228 (*)    All other components within normal limits    Imaging Review No results found. I have personally reviewed and evaluated these images and lab results as part of my medical decision-making.   EKG Interpretation None      MDM   Final diagnoses:  Breast abscess    Filed Vitals:   09/10/15 1153 09/10/15 1409  BP: 135/96 142/98  Pulse: 107 106  Temp: 98.6 F (37 C)   TempSrc: Oral   Resp: 18   Height:  (1.549 m)   Weight: 232 lb (105.235 kg)   SpO2: 98% 100%    Medications  ibuprofen (ADVIL,MOTRIN) tablet 800 mg (800 mg Oral Given 09/10/15 1401)  sulfamethoxazole-trimethoprim (BACTRIM DS,SEPTRA DS) 800-160 MG per tablet 1 tablet (1 tablet Oral Given 09/10/15 1401)    KIORA HALLBERG is 39 y.o. female presenting with recurrent abscess to right breasts, no signs of systemic infection. Patient states she is always tachycardic, blood glucose is mildly elevated at 228. Counseled patient on wound care and return precautions.  This is a shared visit with the attending physician who personally evaluated the patient and agrees with the care plan.    Evaluation does not show pathology that would require ongoing emergent intervention or inpatient treatment. Pt is hemodynamically stable and  mentating appropriately. Discussed findings and plan with patient/guardian, who agrees with care plan. All questions answered. Return precautions discussed and outpatient follow up given.   New Prescriptions   OXYCODONE-ACETAMINOPHEN (PERCOCET/ROXICET) 5-325 MG TABLET    1 to 2 tabs PO q6hrs  PRN for pain   SULFAMETHOXAZOLE-TRIMETHOPRIM (BACTRIM DS,SEPTRA DS) 800-160 MG TABLET    Take 1 tablet by mouth 2 (two) times daily.         Wynetta Emery, PA-C 09/10/15 1433  Laurence Spates, MD 09/11/15 [phone]

## 2015-09-10 NOTE — ED Notes (Signed)
Pt with small open abscess to R lateral breast. No draining noted, slight erythema.

## 2015-10-30 ENCOUNTER — Emergency Department (HOSPITAL_BASED_OUTPATIENT_CLINIC_OR_DEPARTMENT_OTHER)
Admission: EM | Admit: 2015-10-30 | Discharge: 2015-10-30 | Disposition: A | Discharge status: Home / Self Care | Payer: Medicaid Other | Attending: Emergency Medicine | Admitting: Emergency Medicine

## 2015-10-30 ENCOUNTER — Emergency Department (HOSPITAL_BASED_OUTPATIENT_CLINIC_OR_DEPARTMENT_OTHER): Payer: Medicaid Other

## 2015-10-30 ENCOUNTER — Encounter (HOSPITAL_BASED_OUTPATIENT_CLINIC_OR_DEPARTMENT_OTHER): Payer: Self-pay | Admitting: *Deleted

## 2015-10-30 DIAGNOSIS — Z7984 Long term (current) use of oral hypoglycemic drugs: Secondary | ICD-10-CM | POA: Insufficient documentation

## 2015-10-30 DIAGNOSIS — F17210 Nicotine dependence, cigarettes, uncomplicated: Secondary | ICD-10-CM | POA: Diagnosis not present

## 2015-10-30 DIAGNOSIS — N760 Acute vaginitis: Secondary | ICD-10-CM | POA: Diagnosis not present

## 2015-10-30 DIAGNOSIS — B373 Candidiasis of vulva and vagina: Secondary | ICD-10-CM | POA: Insufficient documentation

## 2015-10-30 DIAGNOSIS — Z791 Long term (current) use of non-steroidal anti-inflammatories (NSAID): Secondary | ICD-10-CM | POA: Diagnosis not present

## 2015-10-30 DIAGNOSIS — M792 Neuralgia and neuritis, unspecified: Secondary | ICD-10-CM | POA: Diagnosis not present

## 2015-10-30 DIAGNOSIS — E119 Type 2 diabetes mellitus without complications: Secondary | ICD-10-CM | POA: Insufficient documentation

## 2015-10-30 DIAGNOSIS — Z792 Long term (current) use of antibiotics: Secondary | ICD-10-CM | POA: Insufficient documentation

## 2015-10-30 DIAGNOSIS — Z880 Allergy status to penicillin: Secondary | ICD-10-CM | POA: Diagnosis not present

## 2015-10-30 DIAGNOSIS — K219 Gastro-esophageal reflux disease without esophagitis: Secondary | ICD-10-CM | POA: Diagnosis not present

## 2015-10-30 DIAGNOSIS — R05 Cough: Secondary | ICD-10-CM | POA: Diagnosis present

## 2015-10-30 DIAGNOSIS — R112 Nausea with vomiting, unspecified: Secondary | ICD-10-CM | POA: Diagnosis not present

## 2015-10-30 DIAGNOSIS — Z90710 Acquired absence of both cervix and uterus: Secondary | ICD-10-CM | POA: Insufficient documentation

## 2015-10-30 DIAGNOSIS — B9689 Other specified bacterial agents as the cause of diseases classified elsewhere: Secondary | ICD-10-CM

## 2015-10-30 DIAGNOSIS — I10 Essential (primary) hypertension: Secondary | ICD-10-CM | POA: Insufficient documentation

## 2015-10-30 DIAGNOSIS — Z79899 Other long term (current) drug therapy: Secondary | ICD-10-CM | POA: Insufficient documentation

## 2015-10-30 DIAGNOSIS — I2510 Atherosclerotic heart disease of native coronary artery without angina pectoris: Secondary | ICD-10-CM | POA: Insufficient documentation

## 2015-10-30 DIAGNOSIS — M791 Myalgia: Secondary | ICD-10-CM | POA: Insufficient documentation

## 2015-10-30 DIAGNOSIS — J0110 Acute frontal sinusitis, unspecified: Secondary | ICD-10-CM | POA: Diagnosis not present

## 2015-10-30 DIAGNOSIS — B379 Candidiasis, unspecified: Secondary | ICD-10-CM

## 2015-10-30 LAB — URINALYSIS, ROUTINE W REFLEX MICROSCOPIC
Bilirubin Urine: NEGATIVE
Glucose, UA: >1000 mg/dL — AB
Hgb urine dipstick: NEGATIVE
KETONES UR: NEGATIVE mg/dL
LEUKOCYTES UA: NEGATIVE
NITRITE: NEGATIVE
PH: 6 (ref 5.0–8.0)
PROTEIN: NEGATIVE mg/dL
Specific Gravity, Urine: 1.026 (ref 1.005–1.030)

## 2015-10-30 LAB — CBC WITH DIFFERENTIAL/PLATELET
BASOS ABS: 0 10*3/uL (ref 0.0–0.1)
BASOS PCT: 0 %
Eosinophils Absolute: 0.3 10*3/uL (ref 0.0–0.7)
Eosinophils Relative: 3 %
HEMATOCRIT: 33.9 % — AB (ref 36.0–46.0)
HEMOGLOBIN: 10.9 g/dL — AB (ref 12.0–15.0)
Lymphocytes Relative: 32 %
Lymphs Abs: 2.8 10*3/uL (ref 0.7–4.0)
MCH: 27.7 pg (ref 26.0–34.0)
MCHC: 32.2 g/dL (ref 30.0–36.0)
MCV: 86.3 fL (ref 78.0–100.0)
MONO ABS: 0.4 10*3/uL (ref 0.1–1.0)
Monocytes Relative: 4 %
NEUTROS ABS: 5.2 10*3/uL (ref 1.7–7.7)
NEUTROS PCT: 61 %
Platelets: 284 10*3/uL (ref 150–400)
RBC: 3.93 MIL/uL (ref 3.87–5.11)
RDW: 12.7 % (ref 11.5–15.5)
WBC: 8.7 10*3/uL (ref 4.0–10.5)

## 2015-10-30 LAB — WET PREP, GENITAL
Sperm: NONE SEEN
TRICH WET PREP: NONE SEEN

## 2015-10-30 LAB — BASIC METABOLIC PANEL
ANION GAP: 8 (ref 5–15)
BUN: 11 mg/dL (ref 6–20)
CALCIUM: 8.6 mg/dL — AB (ref 8.9–10.3)
CO2: 23 mmol/L (ref 22–32)
Chloride: 105 mmol/L (ref 101–111)
Creatinine, Ser: 0.81 mg/dL (ref 0.44–1.00)
GFR calc non Af Amer: >60 mL/min (ref >60)
Glucose, Bld: 257 mg/dL — ABNORMAL HIGH (ref 65–99)
POTASSIUM: 3.5 mmol/L (ref 3.5–5.1)
Sodium: 136 mmol/L (ref 135–145)

## 2015-10-30 LAB — URINE MICROSCOPIC-ADD ON

## 2015-10-30 MED ORDER — FLUCONAZOLE 100 MG PO TABS
150.0000 mg | ORAL_TABLET | Freq: Once | ORAL | Status: AC
  Administered 2015-10-30: 150 mg via ORAL
  Filled 2015-10-30: qty 1

## 2015-10-30 MED ORDER — ONDANSETRON HCL 4 MG/2ML IJ SOLN
4.0000 mg | Freq: Once | INTRAMUSCULAR | Status: AC
  Administered 2015-10-30: 4 mg via INTRAVENOUS
  Filled 2015-10-30: qty 2

## 2015-10-30 MED ORDER — METRONIDAZOLE 500 MG PO TABS: 500.0000 mg | ORAL_TABLET | Freq: Two times a day (BID) | ORAL | Status: DC

## 2015-10-30 MED ORDER — BUTALBITAL-APAP-CAFFEINE 50-325-40 MG PO TABS: 1.0000 | ORAL_TABLET | Freq: Four times a day (QID) | ORAL | Status: DC | PRN

## 2015-10-30 MED ORDER — DOXYCYCLINE HYCLATE 100 MG PO CAPS: 100.0000 mg | ORAL_CAPSULE | Freq: Two times a day (BID) | ORAL | Status: DC

## 2015-10-30 MED ORDER — KETOROLAC TROMETHAMINE 30 MG/ML IJ SOLN
30.0000 mg | Freq: Once | INTRAMUSCULAR | Status: AC
  Administered 2015-10-30: 30 mg via INTRAVENOUS
  Filled 2015-10-30: qty 1

## 2015-10-30 NOTE — ED Notes (Signed)
 of NS fluid completed

## 2015-10-30 NOTE — ED Notes (Signed)
PA-C in room to evaluate pt

## 2015-10-30 NOTE — ED Notes (Signed)
Denies any N/V and D at this time

## 2015-10-30 NOTE — ED Notes (Signed)
Pt c/o body aches, HA, poor appetite, fatigued, states also has a yeast infection

## 2015-10-30 NOTE — Discharge Instructions (Signed)
Bacterial Vaginosis °Bacterial vaginosis is a vaginal infection that occurs when the normal balance of bacteria in the vagina is disrupted. It results from an overgrowth of certain bacteria. This is the most common vaginal infection in women of childbearing age. Treatment is important to prevent complications, especially in pregnant women, as it can cause a premature delivery. °CAUSES  °Bacterial vaginosis is caused by an increase in harmful bacteria that are normally present in smaller amounts in the vagina. Several different kinds of bacteria can cause bacterial vaginosis. However, the reason that the condition develops is not fully understood. °RISK FACTORS °Certain activities or behaviors can put you at an increased risk of developing bacterial vaginosis, including: °· Having a new sex partner or multiple sex partners. °· Douching. °· Using an intrauterine device (IUD) for contraception. °Women do not get bacterial vaginosis from toilet seats, bedding, swimming pools, or contact with objects around them. °SIGNS AND SYMPTOMS  °Some women with bacterial vaginosis have no signs or symptoms. Common symptoms include: °· Grey vaginal discharge. °· A fishlike odor with discharge, especially after sexual intercourse. °· Itching or burning of the vagina and vulva. °· Burning or pain with urination. °DIAGNOSIS  °Your health care provider will take a medical history and examine the vagina for signs of bacterial vaginosis. A sample of vaginal fluid may be taken. Your health care provider will look at this sample under a microscope to check for bacteria and abnormal cells. A vaginal pH test may also be done.  °TREATMENT  °Bacterial vaginosis may be treated with antibiotic medicines. These may be given in the form of a pill or a vaginal cream. A second round of antibiotics may be prescribed if the condition comes back after treatment. Because bacterial vaginosis increases your risk for sexually transmitted diseases, getting  treated can help reduce your risk for chlamydia, gonorrhea, HIV, and herpes. °HOME CARE INSTRUCTIONS  °· Only take over-the-counter or prescription medicines as directed by your health care provider. °· If antibiotic medicine was prescribed, take it as directed. Make sure you finish it even if you start to feel better. °· Tell all sexual partners that you have a vaginal infection. They should see their health care provider and be treated if they have problems, such as a mild rash or itching. °· During treatment, it is important that you follow these instructions: °· Avoid sexual activity or use condoms correctly. °· Do not douche. °· Avoid alcohol as directed by your health care provider. °· Avoid breastfeeding as directed by your health care provider. °SEEK MEDICAL CARE IF:  °· Your symptoms are not improving after 3 days of treatment. °· You have increased discharge or pain. °· You have a fever. °MAKE SURE YOU:  °· Understand these instructions. °· Will watch your condition. °· Will get help right away if you are not doing well or get worse. °FOR MORE INFORMATION  °Centers for Disease Control and Prevention, Division of STD Prevention: www.cdc.gov/std °American Sexual Health Association (ASHA): www.ashastd.org  °  °This information is not intended to replace advice given to you by your health care provider. Make sure you discuss any questions you have with your health care provider. °  °Document Released: 10/16/2005 Document Revised: 11/06/2014 Document Reviewed: 05/28/2013 °Elsevier Interactive Patient Education ©2016 Elsevier Inc. ° °Sinusitis, Adult °Sinusitis is redness, soreness, and inflammation of the paranasal sinuses. Paranasal sinuses are air pockets within the bones of your face. They are located beneath your eyes, in the middle of your forehead, and   above your eyes. In healthy paranasal sinuses, mucus is able to drain out, and air is able to circulate through them by way of your nose. However, when  your paranasal sinuses are inflamed, mucus and air can become trapped. This can allow bacteria and other germs to grow and cause infection. °Sinusitis can develop quickly and last only a short time (acute) or continue over a long period (chronic). Sinusitis that lasts for more than 12 weeks is considered chronic. °CAUSES °Causes of sinusitis include: °· Allergies. °· Structural abnormalities, such as displacement of the cartilage that separates your nostrils (deviated septum), which can decrease the air flow through your nose and sinuses and affect sinus drainage. °· Functional abnormalities, such as when the small hairs (cilia) that line your sinuses and help remove mucus do not work properly or are not present. °SIGNS AND SYMPTOMS °Symptoms of acute and chronic sinusitis are the same. The primary symptoms are pain and pressure around the affected sinuses. Other symptoms include: °· Upper toothache. °· Earache. °· Headache. °· Bad breath. °· Decreased sense of smell and taste. °· A cough, which worsens when you are lying flat. °· Fatigue. °· Fever. °· Thick drainage from your nose, which often is green and may contain pus (purulent). °· Swelling and warmth over the affected sinuses. °DIAGNOSIS °Your health care provider will perform a physical exam. During your exam, your health care provider may perform any of the following to help determine if you have acute sinusitis or chronic sinusitis: °· Look in your nose for signs of abnormal growths in your nostrils (nasal polyps). °· Tap over the affected sinus to check for signs of infection. °· View the inside of your sinuses using an imaging device that has a light attached (endoscope). °If your health care provider suspects that you have chronic sinusitis, one or more of the following tests may be recommended: °· Allergy tests. °· Nasal culture. A sample of mucus is taken from your nose, sent to a lab, and screened for bacteria. °· Nasal cytology. A sample of mucus is  taken from your nose and examined by your health care provider to determine if your sinusitis is related to an allergy. °TREATMENT °Most cases of acute sinusitis are related to a viral infection and will resolve on their own within 10 days. Sometimes, medicines are prescribed to help relieve symptoms of both acute and chronic sinusitis. These may include pain medicines, decongestants, nasal steroid sprays, or saline sprays. °However, for sinusitis related to a bacterial infection, your health care provider will prescribe antibiotic medicines. These are medicines that will help kill the bacteria causing the infection. °Rarely, sinusitis is caused by a fungal infection. In these cases, your health care provider will prescribe antifungal medicine. °For some cases of chronic sinusitis, surgery is needed. Generally, these are cases in which sinusitis recurs more than 3 times per year, despite other treatments. °HOME CARE INSTRUCTIONS °· Drink plenty of water. Water helps thin the mucus so your sinuses can drain more easily. °· Use a humidifier. °· Inhale steam 3-4 times a day (for example, sit in the bathroom with the shower running). °· Apply a warm, moist washcloth to your face 3-4 times a day, or as directed by your health care provider. °· Use saline nasal sprays to help moisten and clean your sinuses. °· Take medicines only as directed by your health care provider. °· If you were prescribed either an antibiotic or antifungal medicine, finish it all even if you start to   feel better. °SEEK IMMEDIATE MEDICAL CARE IF: °· You have increasing pain or severe headaches. °· You have nausea, vomiting, or drowsiness. °· You have swelling around your face. °· You have vision problems. °· You have a stiff neck. °· You have difficulty breathing. °  °This information is not intended to replace advice given to you by your health care provider. Make sure you discuss any questions you have with your health care provider. °  °Document  Released: 10/16/2005 Document Revised: 11/06/2014 Document Reviewed: 10/31/2011 °Elsevier Interactive Patient Education ©2016 Elsevier Inc. ° °

## 2015-10-30 NOTE — ED Notes (Signed)
Pt reports 2 weeks of intermittent N/V, fever, cough, nasal congestions.  Pt also reports vaginal discharge and itching.

## 2015-10-30 NOTE — ED Provider Notes (Signed)
CSN: [id number]     Arrival date & time 10/30/15  1156 History   First MD Initiated Contact with Patient 10/30/15 1253     Chief Complaint  Patient presents with  . Influenza   HPI  Chelsey Murphy is a 39 year old female with PMHx of DM, HTN, GERD, IBS and CAD presenting with multiple complaints. She reports onset of fevers, congestion, cough, myalgias, fatigue and decreased appetite 2 weeks ago. She reports home temperatures ranging between 101-103. She has controlled this with Tylenol and ibuprofen. She reports that her cough is productive of green sputum. She denies shortness of breath or chest pain with the cough. She reports congestion without rhinorrhea. States approximately one week ago she had a sore throat for a few days but this has resolved. She also reports intermittent nausea with vomiting. Denies blood in her vomit. She is not currently nauseous and has not vomited in the past few days. She has taken OTC cold and flu medications without improvement. She also believes that she has a yeast infection. She is complaining of vulvar itching and white vaginal discharge. She has no concern for STD exposure. Denies syncope, dizziness, weakness, ear pain, eye pain, neck pain, chest pain, SOB, abdominal pain, diarrhea, dysuria, hematuria, flank pain, or rashes. She endorses daily headaches for the past 6 months which she is being followed by neurology for.   Past Medical History  Diagnosis Date  . Diabetes mellitus without complication (HCC)   . Hypertension   . Neuropathic pain   . GERD (gastroesophageal reflux disease)   . IBS (irritable bowel syndrome)   . Coronary artery disease    Past Surgical History  Procedure Laterality Date  . Cholecystectomy    . Abdominal hysterectomy     History reviewed. No pertinent family history. Social History  Substance Use Topics  . Smoking status: Current Every Day Smoker -- 0.50 packs/day    Types: Cigarettes  . Smokeless tobacco: None  . Alcohol  Use: No   OB History    No data available     Review of Systems  Constitutional: Positive for fever, appetite change and fatigue.  HENT: Positive for congestion and sore throat. Negative for ear pain, rhinorrhea, sneezing and trouble swallowing.   Eyes: Negative for pain and discharge.  Respiratory: Positive for cough. Negative for shortness of breath and wheezing.   Cardiovascular: Negative for chest pain.  Gastrointestinal: Positive for nausea and vomiting. Negative for abdominal pain and diarrhea.  Genitourinary: Positive for vaginal discharge. Negative for dysuria, hematuria and flank pain.  Musculoskeletal: Positive for myalgias. Negative for neck pain and neck stiffness.  Skin: Negative for rash.  Neurological: Positive for headaches. Negative for dizziness, syncope and weakness.  All other systems reviewed and are negative.     Allergies  Penicillins and Vicodin  Home Medications   Prior to Admission medications   Medication Sig Start Date End Date Taking? Authorizing Provider  albuterol (PROVENTIL HFA;VENTOLIN HFA) 108 (90 BASE) MCG/ACT inhaler Inhale 2 puffs into the lungs every 6 (six) hours as needed for wheezing or shortness of breath.    Historical Provider, MD  amLODipine (NORVASC) 5 MG tablet Take 1 tablet (5 mg total) by mouth daily. 11/30/14   Renne Crigler, PA-C  dicyclomine (BENTYL) 20 MG tablet Take 20 mg by mouth every 6 (six) hours.    Historical Provider, MD  doxycycline (VIBRAMYCIN) 100 MG capsule Take 1 capsule (100 mg total) by mouth 2 (two) times daily. 10/30/15  Elyan Vanwieren, PA-C  gabapentin (NEURONTIN) 300 MG capsule Take 300 mg by mouth 3 (three) times daily.    Historical Provider, MD  hydrochlorothiazide (HYDRODIURIL) 25 MG tablet Take 1 tablet (25 mg total) by mouth daily. 11/30/14   Renne CriglerJoshua Geiple, PA-C  labetalol (NORMODYNE) 100 MG tablet Take 100 mg by mouth 2 (two) times daily.    Historical Provider, MD  losartan (COZAAR) 50 MG tablet Take 50 mg  by mouth daily.    Historical Provider, MD  meloxicam (MOBIC) 15 MG tablet Take 15 mg by mouth daily.    Historical Provider, MD  metFORMIN (GLUCOPHAGE) 1000 MG tablet Take 1 tablet (1,000 mg total) by mouth 2 (two) times daily with a meal. 11/30/14   Renne CriglerJoshua Geiple, PA-C  metroNIDAZOLE (FLAGYL) 500 MG tablet Take 1 tablet (500 mg total) by mouth 2 (two) times daily. 10/30/15   Taegan Haider, PA-C  OMEPRAZOLE PO Take by mouth.    Historical Provider, MD  oxyCODONE-acetaminophen (PERCOCET/ROXICET) 5-325 MG tablet 1 to 2 tabs PO q6hrs  PRN for pain 09/10/15   Joni ReiningNicole Pisciotta, PA-C  pravastatin (PRAVACHOL) 40 MG tablet Take 40 mg by mouth daily.    Historical Provider, MD  sertraline (ZOLOFT) 25 MG tablet Take 25 mg by mouth daily.    Historical Provider, MD  sitaGLIPtin (JANUVIA) 100 MG tablet Take 1 tablet (100 mg total) by mouth daily. 11/30/14   Renne CriglerJoshua Geiple, PA-C  sulfamethoxazole-trimethoprim (BACTRIM DS,SEPTRA DS) 800-160 MG tablet Take 1 tablet by mouth 2 (two) times daily. 09/10/15   Nicole Pisciotta, PA-C   BP 120/84 mmHg  Pulse 102  Temp(Src) 98.2 F (36.8 C) (Oral)  Resp 20  Ht 5\' 1"  (1.549 m)  Wt 99.791 kg  BMI 41.59 kg/m2  SpO2 100% Physical Exam  Constitutional: She appears well-developed and well-nourished. No distress.  HENT:  Head: Normocephalic and atraumatic.  Nose: Mucosal edema present. Right sinus exhibits frontal sinus tenderness. Right sinus exhibits no maxillary sinus tenderness. Left sinus exhibits frontal sinus tenderness. Left sinus exhibits no maxillary sinus tenderness.  Mouth/Throat: Uvula is midline and oropharynx is clear and moist. Mucous membranes are not dry. No uvula swelling.  Postnasal drip noted.   Eyes: Conjunctivae are normal. Right eye exhibits no discharge. Left eye exhibits no discharge. No scleral icterus.  Neck: Normal range of motion.  Cardiovascular: Normal rate, regular rhythm, normal heart sounds and intact distal pulses.   Pulmonary/Chest:  Effort normal and breath sounds normal. No respiratory distress. She has no wheezes. She has no rales.  Abdominal: Soft. Bowel sounds are normal. There is no tenderness. There is no rebound and no guarding.  Genitourinary: There is no rash or lesion on the right labia. There is no rash or lesion on the left labia. No erythema or bleeding in the vagina. Vaginal discharge found.  Thick, white vaginal discharge. Hysterectomy  Musculoskeletal: Normal range of motion.  Moves all extremities spontaneously  Lymphadenopathy:       Right: No inguinal adenopathy present.       Left: No inguinal adenopathy present.  Neurological: She is alert. Coordination normal.  Skin: Skin is warm and dry.  Psychiatric: She has a normal mood and affect. Her behavior is normal.  Nursing note and vitals reviewed.   ED Course  Procedures (including critical care time) Labs Review Labs Reviewed  WET PREP, GENITAL - Abnormal; Notable for the following:    Yeast Wet Prep HPF POC PRESENT (*)    Clue Cells Wet Prep HPF  POC PRESENT (*)    WBC, Wet Prep HPF POC MODERATE (*)    All other components within normal limits  CBC WITH DIFFERENTIAL/PLATELET - Abnormal; Notable for the following:    Hemoglobin 10.9 (*)    HCT 33.9 (*)    All other components within normal limits  BASIC METABOLIC PANEL - Abnormal; Notable for the following:    Glucose, Bld 257 (*)    Calcium 8.6 (*)    All other components within normal limits  URINALYSIS, ROUTINE W REFLEX MICROSCOPIC (NOT AT [facility]) - Abnormal; Notable for the following:    APPearance CLOUDY (*)    Glucose, UA >1000 (*)    All other components within normal limits  URINE MICROSCOPIC-ADD ON - Abnormal; Notable for the following:    Squamous Epithelial / LPF 6-30 (*)    Bacteria, UA FEW (*)    All other components within normal limits  GC/CHLAMYDIA PROBE AMP ([place]) NOT AT [facility]    Imaging Review Dg Chest 2 View  10/30/2015  CLINICAL DATA:  Fever and cough for 2  weeks EXAM: CHEST  2 VIEW COMPARISON:  02/15/2015 FINDINGS: Normal heart size. Clear lungs. No pneumothorax or pleural effusion. IMPRESSION: No active cardiopulmonary disease. Electronically Signed   By: Jolaine Click M.D.   On: 10/30/2015 14:05   I have personally reviewed and evaluated these images and lab results as part of my medical decision-making.   EKG Interpretation None      MDM   Final diagnoses:  Acute frontal sinusitis, recurrence not specified  BV (bacterial vaginosis)  Yeast infection   Patient presenting with symptoms of sinusitis. Severe symptoms have been present for greater than 10 days with congestion and frontal sinus pain. Concern for acute bacterial rhinosinusitis. Pt also complaining of white vaginal discharge. Wet prep positive for yeast and BV. Treated with diflucan in ED. Patient discharged with doxycycline due to PCN allergy for sinusitis and flagyl for BV. Pt requesting medication for daily headaches; will give short dose of fioricet and stressed importance of neurologist follow up for HA control. Instructions given for warm saline nasal wash and recommendations for follow-up with primary care physician. Return precautions given in discharge paperwork and discussed with pt at bedside. Pt stable for discharge         Alveta Heimlich, PA-C 10/30/15 1611  Harpreet Signore, PA-C 10/30/15 1638  Pricilla Loveless, MD 10/31/15 [phone]

## 2015-11-02 LAB — GC/CHLAMYDIA PROBE AMP (~~LOC~~) NOT AT ARMC
CHLAMYDIA, DNA PROBE: NEGATIVE
NEISSERIA GONORRHEA: NEGATIVE

## 2016-05-02 ENCOUNTER — Emergency Department (HOSPITAL_BASED_OUTPATIENT_CLINIC_OR_DEPARTMENT_OTHER)
Admission: EM | Admit: 2016-05-02 | Discharge: 2016-05-02 | Disposition: A | Discharge status: Home / Self Care | Payer: Medicaid Other | Attending: Emergency Medicine | Admitting: Emergency Medicine

## 2016-05-02 ENCOUNTER — Encounter (HOSPITAL_BASED_OUTPATIENT_CLINIC_OR_DEPARTMENT_OTHER): Payer: Self-pay | Admitting: *Deleted

## 2016-05-02 DIAGNOSIS — R319 Hematuria, unspecified: Secondary | ICD-10-CM | POA: Diagnosis not present

## 2016-05-02 DIAGNOSIS — Z7984 Long term (current) use of oral hypoglycemic drugs: Secondary | ICD-10-CM | POA: Diagnosis not present

## 2016-05-02 DIAGNOSIS — I2510 Atherosclerotic heart disease of native coronary artery without angina pectoris: Secondary | ICD-10-CM | POA: Insufficient documentation

## 2016-05-02 DIAGNOSIS — E119 Type 2 diabetes mellitus without complications: Secondary | ICD-10-CM | POA: Diagnosis not present

## 2016-05-02 DIAGNOSIS — Z7951 Long term (current) use of inhaled steroids: Secondary | ICD-10-CM | POA: Diagnosis not present

## 2016-05-02 DIAGNOSIS — R110 Nausea: Secondary | ICD-10-CM | POA: Diagnosis not present

## 2016-05-02 DIAGNOSIS — I10 Essential (primary) hypertension: Secondary | ICD-10-CM | POA: Insufficient documentation

## 2016-05-02 DIAGNOSIS — F17210 Nicotine dependence, cigarettes, uncomplicated: Secondary | ICD-10-CM | POA: Diagnosis not present

## 2016-05-02 DIAGNOSIS — R109 Unspecified abdominal pain: Secondary | ICD-10-CM | POA: Diagnosis present

## 2016-05-02 DIAGNOSIS — R1030 Lower abdominal pain, unspecified: Secondary | ICD-10-CM

## 2016-05-02 DIAGNOSIS — R0602 Shortness of breath: Secondary | ICD-10-CM | POA: Insufficient documentation

## 2016-05-02 DIAGNOSIS — Z79899 Other long term (current) drug therapy: Secondary | ICD-10-CM | POA: Insufficient documentation

## 2016-05-02 LAB — URINALYSIS W MICROSCOPIC (NOT AT ARMC)
Bilirubin Urine: NEGATIVE
Glucose, UA: 250 mg/dL — AB
HGB URINE DIPSTICK: NEGATIVE
KETONES UR: 15 mg/dL — AB
Leukocytes, UA: NEGATIVE
NITRITE: NEGATIVE
PROTEIN: NEGATIVE mg/dL
Specific Gravity, Urine: 1.028 (ref 1.005–1.030)
pH: 6 (ref 5.0–8.0)

## 2016-05-02 MED ORDER — CYCLOBENZAPRINE HCL 5 MG PO TABS: 5.0000 mg | ORAL_TABLET | Freq: Three times a day (TID) | ORAL | Status: AC | PRN

## 2016-05-02 MED ORDER — NAPROXEN 500 MG PO TABS: 500.0000 mg | ORAL_TABLET | Freq: Two times a day (BID) | ORAL | Status: DC

## 2016-05-02 NOTE — ED Provider Notes (Signed)
CSN: [id number]     Arrival date & time 05/02/16  1343 History   First MD Initiated Contact with Patient 05/02/16 1356     Chief Complaint  Patient presents with  . Hematuria   (Consider location/radiation/quality/duration/timing/severity/associated sxs/prior Treatment) HPI   Chelsey Murphy is a 39-yo female who presents with abdominal pain. The pain has been present for over a year. She reports she had worsening burning back, lower abdominal, and groin pain since last night but that it has been getting worse over the past few days. She says it feels like the neuropathic pain she has in her thighs. She says she noticed blood in her urine this morning right after she woke up and then had pink spotting with wiping around 9 a.m. Pain is chronic and always there. She reports she was told she has bilateral ovarian cysts that may be causing her pain. Sister reports she had been [facility] and had transvaginal U/S. She expressed concern that the cysts could be getting larger. She has tried ibuprofen, naproxen, tramadol without relief. She takes gabapentin and recently had a dose increase without improvement in her neuropathy. She has had nausea but no emesis. She reports a fever to [address] F yesterday as well.   Past Medical History  Diagnosis Date  . Diabetes mellitus without complication (HCC)   . Hypertension   . Neuropathic pain   . GERD (gastroesophageal reflux disease)   . IBS (irritable bowel syndrome)   . Coronary artery disease    Past Surgical History  Procedure Laterality Date  . Cholecystectomy    . Abdominal hysterectomy     No family history on file. Social History  Substance Use Topics  . Smoking status: Current Every Day Smoker -- 0.50 packs/day    Types: Cigarettes  . Smokeless tobacco: None  . Alcohol Use: No   OB History    No data available     Review of Systems  Constitutional: Positive for fever.  Respiratory: Positive for shortness of  breath (says related to anxiety). Negative for cough.   Cardiovascular: Negative for chest pain.  Gastrointestinal: Positive for nausea and abdominal pain. Negative for blood in stool.  Genitourinary: Negative for dysuria.   Allergies  Penicillins and Vicodin  Home Medications   Prior to Admission medications   Medication Sig Start Date End Date Taking? Authorizing Provider  albuterol (PROVENTIL HFA;VENTOLIN HFA) 108 (90 BASE) MCG/ACT inhaler Inhale 2 puffs into the lungs every 6 (six) hours as needed for wheezing or shortness of breath.   Yes Historical Provider, MD  amLODipine (NORVASC) 5 MG tablet Take 1 tablet (5 mg total) by mouth daily. 11/30/14  Yes Renne CriglerJoshua Geiple, PA-C  cloNIDine (CATAPRES) 0.1 MG tablet Take 0.1 mg by mouth 2 (two) times daily.   Yes Historical Provider, MD  dicyclomine (BENTYL) 20 MG tablet Take 20 mg by mouth every 6 (six) hours.   Yes Historical Provider, MD  Exenatide ER (BYDUREON) 2 MG PEN Inject into the skin.   Yes Historical Provider, MD  gabapentin (NEURONTIN) 300 MG capsule Take 600 mg by mouth 3 (three) times daily.    Yes Historical Provider, MD  hydrochlorothiazide (HYDRODIURIL) 25 MG tablet Take 1 tablet (25 mg total) by mouth daily. 11/30/14  Yes Renne CriglerJoshua Geiple, PA-C  hydrOXYzine (ATARAX/VISTARIL) 25 MG tablet Take 25 mg by mouth 3 (three) times daily as needed.   Yes Historical Provider, MD  labetalol (NORMODYNE) 100 MG tablet Take 100 mg by  mouth 2 (two) times daily.   Yes Historical Provider, MD  lamoTRIgine (LAMICTAL) 25 MG tablet Take 25 mg by mouth daily.   Yes Historical Provider, MD  losartan (COZAAR) 50 MG tablet Take 50 mg by mouth daily.   Yes Historical Provider, MD  meloxicam (MOBIC) 15 MG tablet Take 15 mg by mouth daily.   Yes Historical Provider, MD  metFORMIN (GLUCOPHAGE) 1000 MG tablet Take 1 tablet (1,000 mg total) by mouth 2 (two) times daily with a meal. 11/30/14  Yes Renne CriglerJoshua Geiple, PA-C  pravastatin (PRAVACHOL) 40 MG tablet Take 40 mg by  mouth daily.   Yes Historical Provider, MD  sitaGLIPtin (JANUVIA) 100 MG tablet Take 1 tablet (100 mg total) by mouth daily. 11/30/14  Yes Renne CriglerJoshua Geiple, PA-C  cyclobenzaprine (FLEXERIL) 5 MG tablet Take 1 tablet (5 mg total) by mouth 3 (three) times daily as needed for muscle spasms. 05/02/16   [name] Percell BostonMoen Kryslyn Helbig, MD   BP 138/84 mmHg  Pulse 115  Temp(Src) 98.1 F (36.7 C) (Oral)  Resp 20  Ht 5\' 1"  (1.549 m)  Wt 99.791 kg  BMI 41.59 kg/m2  SpO2 98% Physical Exam  Constitutional: She is oriented to person, place, and time.  Abdominal: Soft. Bowel sounds are normal. She exhibits no distension. There is tenderness (Diffusely TTP with moderate TTP over RLQ. Negative Rovsing's and Obturator sign. ). There is no rebound and no guarding.  Musculoskeletal: Normal range of motion.  TTP over entire spine. TTP worst over lumbar paraspinous muscles. Able to walk but using wheelchair due to pain.  Neurological: She is alert and oriented to person, place, and time. No cranial nerve deficit.  Skin: Skin is warm and dry.  Psychiatric:  Anxious affect. Normal mood.   ED Course  Procedures (including critical care time) Labs Review Labs Reviewed  URINALYSIS W MICROSCOPIC (NOT AT [facility]) - Abnormal; Notable for the following:    Glucose, UA 250 (*)    Ketones, ur 15 (*)    Bacteria, UA RARE (*)    Squamous Epithelial / LPF 0-5 (*)    All other components within normal limits    Imaging Review No results found. I have personally reviewed and evaluated these images and lab results as part of my medical decision-making.   EKG Interpretation None      MDM   Final diagnoses:  Lower abdominal pain   Abdominal pain chronic and burning in nature, and patient is afebrile. This suggests against appendicitis. UA without hgb, making nephrolithiasis unlikely. Transvaginal U/S performed yesterday showed right ovarian simple cyst and left ovarian complex cyst. Suspect pain may be secondary to these  cysts. Will refer patient to gynecology and recommended [facility]. Patient did not want prescriptions for naproxen, tramadol or hydrocodone. Back pain appears musculoskeletal in nature; flexeril was prescribed for patient to try for symptom relief.   Dani GobbleHillary Alane Hanssen, MD [facility], PGY-2    [facility] Hisako Bugh, MD 05/02/16 1536  Geoffery Lyonsouglas Delo, MD 05/04/16 [phone]

## 2016-05-02 NOTE — ED Notes (Signed)
Hematuria this am. Pain in her lower abdomen. Hx of ovarian cyst and she thinks the cyst may be getting bigger.

## 2016-05-02 NOTE — Discharge Instructions (Signed)
Ovarian Cyst An ovarian cyst is a fluid-filled sac that forms on an ovary. The ovaries are small organs that produce eggs in women. Various types of cysts can form on the ovaries. Most are not cancerous. Many do not cause problems, and they often go away on their own. Some may cause symptoms and require treatment. Common types of ovarian cysts include:  Functional cysts--These cysts may occur every month during the menstrual cycle. This is normal. The cysts usually go away with the next menstrual cycle if the woman does not get pregnant. Usually, there are no symptoms with a functional cyst.  Endometrioma cysts--These cysts form from the tissue that lines the uterus. They are also called "chocolate cysts" because they become filled with blood that turns brown. This type of cyst can cause pain in the lower abdomen during intercourse and with your menstrual period.  Cystadenoma cysts--This type develops from the cells on the outside of the ovary. These cysts can get very big and cause lower abdomen pain and pain with intercourse. This type of cyst can twist on itself, cut off its blood supply, and cause severe pain. It can also easily rupture and cause a lot of pain.  Dermoid cysts--This type of cyst is sometimes found in both ovaries. These cysts may contain different kinds of body tissue, such as skin, teeth, hair, or cartilage. They usually do not cause symptoms unless they get very big.  Theca lutein cysts--These cysts occur when too much of a certain hormone (human chorionic gonadotropin) is produced and overstimulates the ovaries to produce an egg. This is most common after procedures used to assist with the conception of a baby (in vitro fertilization). CAUSES   Fertility drugs can cause a condition in which multiple large cysts are formed on the ovaries. This is called ovarian hyperstimulation syndrome.  A condition called polycystic ovary syndrome can cause hormonal imbalances that can lead to  nonfunctional ovarian cysts. SIGNS AND SYMPTOMS  Many ovarian cysts do not cause symptoms. If symptoms are present, they may include:  Pelvic pain or pressure.  Pain in the lower abdomen.  Pain during sexual intercourse.  Increasing girth (swelling) of the abdomen.  Abnormal menstrual periods.  Increasing pain with menstrual periods.  Stopping having menstrual periods without being pregnant. DIAGNOSIS  These cysts are commonly found during a routine or annual pelvic exam. Tests may be ordered to find out more about the cyst. These tests may include:  Ultrasound.  X-ray of the pelvis.  CT scan.  MRI.  Blood tests. TREATMENT  Many ovarian cysts go away on their own without treatment. Your health care provider may want to check your cyst regularly for 2-3 months to see if it changes. For women in menopause, it is particularly important to monitor a cyst closely because of the higher rate of ovarian cancer in menopausal women. When treatment is needed, it may include any of the following:  A procedure to drain the cyst (aspiration). This may be done using a long needle and ultrasound. It can also be done through a laparoscopic procedure. This involves using a thin, lighted tube with a tiny camera on the end (laparoscope) inserted through a small incision.  Surgery to remove the whole cyst. This may be done using laparoscopic surgery or an open surgery involving a larger incision in the lower abdomen.  Hormone treatment or birth control pills. These methods are sometimes used to help dissolve a cyst. HOME CARE INSTRUCTIONS   Only take over-the-counter   or prescription medicines as directed by your health care provider.  Follow up with your health care provider as directed.  Get regular pelvic exams and Pap tests. SEEK MEDICAL CARE IF:   Your periods are late, irregular, or painful, or they stop.  Your pelvic pain or abdominal pain does not go away.  Your abdomen becomes  larger or swollen.  You have pressure on your bladder or trouble emptying your bladder completely.  You have pain during sexual intercourse.  You have feelings of fullness, pressure, or discomfort in your stomach.  You lose weight for no apparent reason.  You feel generally ill.  You become constipated.  You lose your appetite.  You develop acne.  You have an increase in body and facial hair.  You are gaining weight, without changing your exercise and eating habits.  You think you are pregnant. SEEK IMMEDIATE MEDICAL CARE IF:   You have increasing abdominal pain.  You feel sick to your stomach (nauseous), and you throw up (vomit).  You develop a fever that comes on suddenly.  You have abdominal pain during a bowel movement.  Your menstrual periods become heavier than usual. MAKE SURE YOU:  Understand these instructions.  Will watch your condition.  Will get help right away if you are not doing well or get worse.   This information is not intended to replace advice given to you by your health care provider. Make sure you discuss any questions you have with your health care provider.   Document Released: 10/16/2005 Document Revised: 10/21/2013 Document Reviewed: 06/23/2013 Elsevier Interactive Patient Education 2016 [employer]  

## 2016-06-05 ENCOUNTER — Encounter: Payer: Self-pay | Admitting: Obstetrics and Gynecology

## 2016-06-05 ENCOUNTER — Ambulatory Visit (INDEPENDENT_AMBULATORY_CARE_PROVIDER_SITE_OTHER): Payer: Medicaid Other | Admitting: Obstetrics and Gynecology

## 2016-06-05 DIAGNOSIS — N809 Endometriosis, unspecified: Secondary | ICD-10-CM

## 2016-06-05 MED ORDER — LEUPROLIDE ACETATE (3 MONTH) 11.25 MG IM KIT: 11.2500 mg | PACK | INTRAMUSCULAR | 2 refills | Status: DC

## 2016-06-05 NOTE — Patient Instructions (Signed)
Endometriosis Endometriosis is a condition in which the tissue that lines the uterus (endometrium) grows outside of its normal location. The tissue may grow in many locations close to the uterus, but it commonly grows on the ovaries, fallopian tubes, vagina, or bowel. Because the uterus expels, or sheds, its lining every menstrual cycle, there is bleeding wherever the endometrial tissue is located. This can cause pain because blood is irritating to tissues not normally exposed to it.  CAUSES  The cause of endometriosis is not known.  SIGNS AND SYMPTOMS  Often, there are no symptoms. When symptoms are present, they can vary with the location of the displaced tissue. Various symptoms can occur at different times. Although symptoms occur mainly during a woman's menstrual period, they can also occur midcycle and usually stop with menopause. Some people may go months with no symptoms at all. Symptoms may include:   Back or abdominal pain.   Heavier bleeding during periods.   Pain during intercourse.   Painful bowel movements.   Infertility. DIAGNOSIS  Your health care provider will do a physical exam and ask about your symptoms. Various tests may be done, such as:   Blood tests and urine tests. These are done to help rule out other problems.   Ultrasound. This test is done to look for abnormal tissue.   An X-ray of the lower bowel (barium enema).  Laparoscopy. In this procedure, a thin, lighted tube with a tiny camera on the end (laparoscope) is inserted into your abdomen. This helps your health care provider look for abnormal tissue to confirm the diagnosis. The health care provider may also remove a small piece of tissue (biopsy) from any abnormal tissue found. This tissue sample can then be sent to a lab so it can be looked at under a microscope. TREATMENT  Treatment will vary and may include:   Medicines to relieve pain. Nonsteroidal anti-inflammatory drugs (NSAIDs) are a type of  pain medicine that can help to relieve the pain caused by endometriosis.  Hormonal therapy. When using hormonal therapy, periods are eliminated. This eliminates the monthly exposure to blood by the displaced endometrial tissue.   Surgery. Surgery may sometimes be done to remove the abnormal endometrial tissue. In severe cases, surgery may be done to remove the fallopian tubes, uterus, and ovaries (hysterectomy). HOME CARE INSTRUCTIONS   Take all medicines as directed by your health care provider. Do not take aspirin because it may increase bleeding when you are not on hormonal therapy.   Avoid activities that produce pain, including sexual activity. SEEK MEDICAL CARE IF:  You have pelvic pain before, after, or during your periods.  You have pelvic pain between periods that gets worse during your period.  You have pelvic pain during or after sex.  You have pelvic pain with bowel movements or urination, especially during your period.  You have problems getting pregnant.  You have a fever. SEEK IMMEDIATE MEDICAL CARE IF:   Your pain is severe and is not responding to pain medicine.   You have severe nausea and vomiting, or you cannot keep foods down.   You have pain that is limited to the right lower part of your abdomen.   You have swelling or increasing pain in your abdomen.   You see blood in your stool.  MAKE SURE YOU:   Understand these instructions.  Will watch your condition.  Will get help right away if you are not doing well or get worse.   This information   is not intended to replace advice given to you by your health care provider. Make sure you discuss any questions you have with your health care provider.   Document Released: 10/13/2000 Document Revised: 11/06/2014 Document Reviewed: 06/13/2013 Elsevier Interactive Patient Education 2016 [employer] Inc.  

## 2016-06-05 NOTE — Progress Notes (Signed)
40 yo G4P4 presenting today for second opinion for the management of her endometriosis and recurrence of ovarian cysts. Patient had a hysterectomy in [place] in 2003 for the management of endometriosis. Her ovaries were spared. Patient was told she had endometriosis and treatment plan was hysterectomy, she states she desired to have more children at that time. She reports recurrence of ovarian cysts which were medically managed with OCP. Patient was recently diagnosed with an ovarian cyst and saw an OB/GYN who suggested Lupron for medical management. She received a one month dose of depo-Lupron on 05/08/2016. She continues to experience bilateral lower pelvic pain but it is not as bad as it was. Previously, the pain radiated down her right leg and lower back. She denies any abnormal discharge. She is sexually active with her husband of 4 years.   Past Medical History:  Diagnosis Date  . Coronary artery disease   . Diabetes mellitus without complication (HCC)   . GERD (gastroesophageal reflux disease)   . Hypertension   . IBS (irritable bowel syndrome)   . Neuropathic pain    Past Surgical History:  Procedure Laterality Date  . ABDOMINAL HYSTERECTOMY    . CHOLECYSTECTOMY     No family history on file. Social History  Substance Use Topics  . Smoking status: Current Every Day Smoker    Packs/day: 0.50    Types: Cigarettes  . Smokeless tobacco: Not on file  . Alcohol use No   ROS See pertinent in HPI  GENERAL: Well-developed, well-nourished female in no acute distress.  HEENT: Normocephalic, atraumatic. Sclerae anicteric.  NECK: Supple. Normal thyroid.  LUNGS: Clear to auscultation bilaterally.  HEART: Regular rate and rhythm. ABDOMEN: Soft, nontender, nondistended. No organomegaly. PELVIC: Normal external female genitalia. Vagina is pink and rugated.  Normal discharge. No adnexal mass or tenderness. EXTREMITIES: No cyanosis, clubbing, or edema, 2+ distal pulses.  CLINICAL  DATA:Pelvic pain.  EXAM: TRANSABDOMINAL AND TRANSVAGINAL ULTRASOUND OF PELVIS  TECHNIQUE: Both transabdominal and transvaginal ultrasound examinations of the pelvis were performed. Transabdominal technique was performed for global imaging of the pelvis including uterus, ovaries, adnexal regions, and pelvic cul-de-sac. It was necessary to proceed with endovaginal exam following the transabdominal exam to visualize the ovaries.  COMPARISON:CT 01/06/2014.  FINDINGS: Uterus  Hysterectomy. Shadowing echogenic focus noted in the region the vaginal cuff, possibly dystrophic calcifications. Similar findings noted on prior CT.  Right ovary  Measurements: 3.0 x 1.6 x 2.0 cm. 1.4 x 1.6 x 1.3 cm simple cyst most likely a follicle. Right ovary is difficult to visualize due to overlapping bowel.  Left ovary  Measurements: 3.8 x 3.0 x 2.1 cm. 2.0 x 2.5 x 1.9 cm complex cyst, possibly hemorrhagic cyst.  Other findings  No abnormal free fluid.  IMPRESSION: 1. 2.0 x 2.5 x 1.9 cm complex cyst left ovary. This possibly a hemorrhagic cyst. Short-interval follow up ultrasound in 6-12 weeks is recommended, preferably during the week following the patient's normal menses.  2. Prior hysterectomy. Shadowing echogenic focus in the region of the vaginal cuff, possibly dystrophic calcifications. Similar findings noted on prior CT of 01/06/2014. This can be further re-evaluated on follow-up ultrasound.   Electronically Signed ByMary Sella: ThomasRegister On: 05/01/2016 14:42  A/P 40 yo with endometriosis and pelvic pain here for second opinion on depo-Lupron - Discussed and explained the diagnosis of endometriosis  - Discussed why depo-lupron is a good option for the management of endometriosis. Advised to continue with lupron for 6 months to see if it helps with  her symptoms - Patient reports a normal mammogram recently - Patient had a normal pap smear in 03/2016 - RTC in a few days for  46-month depo-lupron - RTC in 6 months for follow up and further management if indicated

## 2016-06-05 NOTE — Progress Notes (Signed)
Pt here fo reval of Ovarian Cyst - Had US at [facility]  Result: IMPRESSION: 1. 2.0 x 2.5 x 1.9 cm complex cyst left ovary. This possibly a hemorrhagic cyst. Short-interval follow up ultrasound in 6-12 weeks is recommended, preferably during the week following the patient's normal menses.  2. Prior hysterectomy. Shadowing echogenic focus in the region of the vaginal cuff, possibly dystrophic calcifications. Similar findings noted on prior CT of 01/06/2014. This can be further re-evaluated on follow-up ultrasound.  She states she saw Dr. Delford [facility]Wright [facility] on 05/05/16 who Rx Norethindrone 5mg  and Lupron Depo 3.75. She completed the Lupron injection and only took 1 pill of the Norethindrone. She is concerned about this putting her in Menopause per Dr. Delford FieldWright.

## 2016-06-05 NOTE — Progress Notes (Signed)
Patient records from 2003 were obtained and reviewed. Patient underwent a LAVH in 2003 secondary to chronic pelvic pain, DUB and dysmenorrhea. Intraoperative findings revealed a normal uterus and ovaries. There was no mention of endometriosis. Patient will be informed of these findings. Plan to continue with current plan of care with medical management with depo-Lupron unless the patient changes her mind. If no improvement in her pain following depo-lupron treatment will have to refer patient to pain management clinic.

## 2016-06-09 ENCOUNTER — Ambulatory Visit (INDEPENDENT_AMBULATORY_CARE_PROVIDER_SITE_OTHER): Payer: Medicaid Other | Admitting: *Deleted

## 2016-06-09 DIAGNOSIS — N809 Endometriosis, unspecified: Secondary | ICD-10-CM | POA: Diagnosis not present

## 2016-06-09 DIAGNOSIS — Z3202 Encounter for pregnancy test, result negative: Secondary | ICD-10-CM | POA: Diagnosis not present

## 2016-06-09 DIAGNOSIS — Z79818 Long term (current) use of other agents affecting estrogen receptors and estrogen levels: Secondary | ICD-10-CM

## 2016-06-09 LAB — POCT URINE PREGNANCY: Preg Test, Ur: NEGATIVE

## 2016-06-09 MED ORDER — LEUPROLIDE ACETATE (3 MONTH) 11.25 MG IM KIT
11.2500 mg | PACK | Freq: Once | INTRAMUSCULAR | Status: AC
  Administered 2016-06-09: 11.25 mg via INTRAMUSCULAR

## 2016-06-09 NOTE — Progress Notes (Signed)
Pt is in office today for Lupron injection.  Pt was seen in office on 06/05/16 by Dr Jolayne Pantheronstant and approved to continue on Lupron if she chooses.   Reviewed pt chart, pt received 1 month dose of Lupron in July.  Pt is now in office for next injection and has brought in Lupron 11.25mg  dose. Pt was advised to continue to monitor symptoms and to contact office for any problems. Pt made aware this is a 3 month dose and next injection will be due on 08/31/16. Pt states that she is to have a f/u appt in 3 months, advised to bring Lupron to that visit if she wishes to continue.    Administrations This Visit    leuprolide (LUPRON) injection 11.25 mg    Admin Date 06/09/2016 Action Given Dose 11.25 mg Route Intramuscular Administered By Lanney GinsSuzanne D Aylene Acoff, CMA

## 2016-08-31 ENCOUNTER — Ambulatory Visit: Payer: Medicaid Other | Admitting: Obstetrics & Gynecology

## 2016-09-01 ENCOUNTER — Ambulatory Visit: Payer: Medicaid Other | Admitting: Obstetrics & Gynecology

## 2016-10-04 ENCOUNTER — Emergency Department (HOSPITAL_BASED_OUTPATIENT_CLINIC_OR_DEPARTMENT_OTHER)
Admission: EM | Admit: 2016-10-04 | Discharge: 2016-10-04 | Disposition: A | Discharge status: Home / Self Care | Payer: Medicaid Other | Attending: Emergency Medicine | Admitting: Emergency Medicine

## 2016-10-04 ENCOUNTER — Emergency Department (HOSPITAL_BASED_OUTPATIENT_CLINIC_OR_DEPARTMENT_OTHER): Payer: Medicaid Other

## 2016-10-04 ENCOUNTER — Encounter (HOSPITAL_BASED_OUTPATIENT_CLINIC_OR_DEPARTMENT_OTHER): Payer: Self-pay

## 2016-10-04 DIAGNOSIS — J45909 Unspecified asthma, uncomplicated: Secondary | ICD-10-CM | POA: Diagnosis not present

## 2016-10-04 DIAGNOSIS — I2510 Atherosclerotic heart disease of native coronary artery without angina pectoris: Secondary | ICD-10-CM | POA: Diagnosis not present

## 2016-10-04 DIAGNOSIS — M545 Low back pain, unspecified: Secondary | ICD-10-CM

## 2016-10-04 DIAGNOSIS — F17210 Nicotine dependence, cigarettes, uncomplicated: Secondary | ICD-10-CM | POA: Insufficient documentation

## 2016-10-04 DIAGNOSIS — Z79899 Other long term (current) drug therapy: Secondary | ICD-10-CM | POA: Insufficient documentation

## 2016-10-04 DIAGNOSIS — Z7982 Long term (current) use of aspirin: Secondary | ICD-10-CM | POA: Diagnosis not present

## 2016-10-04 DIAGNOSIS — I10 Essential (primary) hypertension: Secondary | ICD-10-CM | POA: Insufficient documentation

## 2016-10-04 DIAGNOSIS — E119 Type 2 diabetes mellitus without complications: Secondary | ICD-10-CM | POA: Insufficient documentation

## 2016-10-04 DIAGNOSIS — Z7984 Long term (current) use of oral hypoglycemic drugs: Secondary | ICD-10-CM | POA: Insufficient documentation

## 2016-10-04 DIAGNOSIS — M549 Dorsalgia, unspecified: Secondary | ICD-10-CM | POA: Diagnosis present

## 2016-10-04 HISTORY — DX: Tic disorder, unspecified: F95.9

## 2016-10-04 HISTORY — DX: Unspecified asthma, uncomplicated: J45.909

## 2016-10-04 HISTORY — DX: Fatty (change of) liver, not elsewhere classified: K76.0

## 2016-10-04 HISTORY — DX: Unspecified osteoarthritis, unspecified site: M19.90

## 2016-10-04 HISTORY — DX: Polyneuropathy, unspecified: G62.9

## 2016-10-04 HISTORY — DX: Headache: R51

## 2016-10-04 HISTORY — DX: Other chronic pain: G89.29

## 2016-10-04 HISTORY — DX: Pure hypercholesterolemia, unspecified: E78.00

## 2016-10-04 HISTORY — DX: Personal history of other diseases of the circulatory system: Z86.79

## 2016-10-04 MED ORDER — IBUPROFEN 600 MG PO TABS: 600.0000 mg | ORAL_TABLET | Freq: Four times a day (QID) | ORAL | 0 refills | Status: AC | PRN

## 2016-10-04 MED ORDER — METHOCARBAMOL 500 MG PO TABS
1000.0000 mg | ORAL_TABLET | Freq: Once | ORAL | Status: AC
  Administered 2016-10-04: 1000 mg via ORAL
  Filled 2016-10-04: qty 2

## 2016-10-04 MED ORDER — METHOCARBAMOL 500 MG PO TABS: 500.0000 mg | ORAL_TABLET | Freq: Two times a day (BID) | ORAL | 0 refills | Status: AC

## 2016-10-04 MED FILL — METHOCARBAMOL 500 MG TABLET: 500 | 15 days supply | Qty: 30 | Fill #0

## 2016-10-04 MED FILL — IBUPROFEN 600 MG TABLET: 600 | 8 days supply | Qty: 30 | Fill #0

## 2016-10-04 NOTE — ED Provider Notes (Signed)
Medical screening examination/treatment/procedure(s) were conducted as a shared visit with non-physician practitioner(s) and myself.  I personally evaluated the patient during the encounter.   EKG Interpretation None       Patient seen by me along with the physician assistant. [facility]. Patient with about one year history of back pain. Mostly radiating into the right leg 5 area. Patient also has a history of neuropathy has decreased sensation from her knees below. Has some trouble with hip pain. Patient has been struggling with this [facility] tried to get an MRI but the insurance would not cover it. Patient has not been seen by orthopedics. Patient gives a history of occasional incontinence but nothing on a regular basis. Patient nontoxic right now no acute distress. Did offer that we could do a CT of her lumbar spine to evaluate things settled in a little more detail. Depending on the findings on that it may help get permission through her clinic to get an MRI.   Vanetta MuldersScott Devyon Keator, MD 10/04/16 1520

## 2016-10-04 NOTE — ED Provider Notes (Signed)
[facility] Provider Note   CSN: [id number] Arrival date & time: 10/04/16  1333  History   Chief Complaint Chief Complaint  Patient presents with  . Back Pain    HPI Chelsey Murphy is a 40 y.o. female.  HPI  40 y.o. female with a hx of CAD, DM, HTN, HLD, neuropathy presents to the Emergency Department today complaining of back pain x 1 year. Notes intermittent pain since LP that was done at that time. Notes difficulty with ambulation at times. Rates pain 9/10. Attempted Tylenol with minimal relief. Has seen PCP for this issue. Told that MRI was needed before Neurosurgery evaluation. Pt states that MRI difficult due to insurance issues. Pt noted fecal incontinence once at night several weeks ago, but has not had an occurrence since then. Noted urinary incontinence once while asleep at night x 2 days ago, but none since then. No fevers. No reoccurrence of symptoms. No CP/SOB/ABD pain. No N/V/D. No other symptoms noted.    Past Medical History:  Diagnosis Date  . Arthritis   . Asthma   . Chronic headaches   . Chronic pain   . Coronary artery disease   . Diabetes mellitus without complication (HCC)   . Fatty liver   . GERD (gastroesophageal reflux disease)   . High cholesterol   . History of angina   . Hypertension   . IBS (irritable bowel syndrome)   . Neuropathic pain   . Neuropathy (HCC)   . Tic     Patient Active Problem List   Diagnosis Date Noted  . Endometriosis 06/05/2016    Past Surgical History:  Procedure Laterality Date  . ABDOMINAL HYSTERECTOMY    . CHOLECYSTECTOMY      OB History    No data available       Home Medications    Prior to Admission medications   Medication Sig Start Date End Date Taking? Authorizing Provider  albuterol (PROVENTIL HFA;VENTOLIN HFA) 108 (90 BASE) MCG/ACT inhaler Inhale 2 puffs into the lungs every 6 (six) hours as needed for wheezing or shortness of breath.    Historical Provider, MD  amLODipine (NORVASC) 5  MG tablet Take 1 tablet (5 mg total) by mouth daily. 11/30/14   Renne CriglerJoshua Geiple, PA-C  aspirin 81 MG chewable tablet Chew by mouth.    Historical Provider, MD  buPROPion (WELLBUTRIN XL) 150 MG 24 hr tablet TK 1 T PO QAM 05/31/16   Historical Provider, MD  cloNIDine (CATAPRES) 0.1 MG tablet Take 0.1 mg by mouth 2 (two) times daily.    Historical Provider, MD  cyclobenzaprine (FLEXERIL) 5 MG tablet Take 1 tablet (5 mg total) by mouth 3 (three) times daily as needed for muscle spasms. 05/02/16   Hillary Percell BostonMoen Fitzgerald, MD  dicyclomine (BENTYL) 20 MG tablet Take 20 mg by mouth every 6 (six) hours.    Historical Provider, MD  Exenatide ER (BYDUREON) 2 MG PEN Inject into the skin.    Historical Provider, MD  gabapentin (NEURONTIN) 300 MG capsule Take 600 mg by mouth 3 (three) times daily.     Historical Provider, MD  hydrochlorothiazide (HYDRODIURIL) 25 MG tablet Take 1 tablet (25 mg total) by mouth daily. 11/30/14   Renne CriglerJoshua Geiple, PA-C  hydrOXYzine (ATARAX/VISTARIL) 25 MG tablet Take 25 mg by mouth 3 (three) times daily as needed.    Historical Provider, MD  labetalol (NORMODYNE) 100 MG tablet Take 100 mg by mouth 2 (two) times daily.    Historical Provider, MD  lamoTRIgine (LAMICTAL) 25 MG tablet Take 25 mg by mouth daily.    Historical Provider, MD  leuprolide (LUPRON DEPOT, 65-MONTH,) 11.25 MG injection Inject 11.25 mg into the muscle every 3 (three) months. 06/05/16   Peggy Constant, MD  losartan (COZAAR) 50 MG tablet Take 50 mg by mouth daily.    Historical Provider, MD  LUPRON DEPOT, 31-MONTH, 3.75 MG injection INJECT 3.75 MG INTO MUSCLE EVERY 28 DAYS 05/08/16   Historical Provider, MD  meloxicam (MOBIC) 15 MG tablet Take 15 mg by mouth daily.    Historical Provider, MD  meloxicam (MOBIC) 15 MG tablet TK 1 T PO D 03/22/16   Historical Provider, MD  metFORMIN (GLUCOPHAGE) 1000 MG tablet Take 1 tablet (1,000 mg total) by mouth 2 (two) times daily with a meal. 11/30/14   Renne CriglerJoshua Geiple, PA-C  norethindrone (AYGESTIN)  5 MG tablet  05/05/16   Historical Provider, MD  pravastatin (PRAVACHOL) 40 MG tablet Take 40 mg by mouth daily.    Historical Provider, MD  sitaGLIPtin (JANUVIA) 100 MG tablet Take 1 tablet (100 mg total) by mouth daily. 11/30/14   Renne CriglerJoshua Geiple, PA-C  Vitamin D, Ergocalciferol, (DRISDOL) 50000 units CAPS capsule TK 1 C PO WEEKLY 04/20/16   Historical Provider, MD    Family History Family History  Problem Relation Age of Onset  . Diabetes Mother   . Diabetes Father     Social History Social History  Substance Use Topics  . Smoking status: Current Every Day Smoker    Packs/day: 0.50    Types: Cigarettes  . Smokeless tobacco: Never Used  . Alcohol use Yes     Comment: occ     Allergies   Penicillins and Vicodin [hydrocodone-acetaminophen]   Review of Systems Review of Systems ROS reviewed and all are negative for acute change except as noted in the HPI.  Physical Exam Updated Vital Signs BP 97/73 (BP Location: Right Arm)   Pulse 109   Temp 98.4 F (36.9 C) (Oral)   Resp 18   Ht 5\' 1"  (1.549 m)   Wt 109.3 kg   SpO2 97%   BMI 45.54 kg/m   Physical Exam  Constitutional: She is oriented to person, place, and time. Vital signs are normal. She appears well-developed and well-nourished.  HENT:  Head: Normocephalic and atraumatic.  Right Ear: Hearing normal.  Left Ear: Hearing normal.  Eyes: Conjunctivae and EOM are normal. Pupils are equal, round, and reactive to light.  Neck: Normal range of motion. Neck supple.  Cardiovascular: Normal rate, regular rhythm, normal heart sounds and intact distal pulses.   Pulmonary/Chest: Effort normal and breath sounds normal.  Musculoskeletal: Normal range of motion.  Neurological: She is alert and oriented to person, place, and time. She has normal strength. No cranial nerve deficit or sensory deficit.  BLE DTR intact and unremarkable. Motor/sensation intact. Ambulates without difficulty.   Skin: Skin is warm and dry.  Psychiatric:  She has a normal mood and affect. Her speech is normal and behavior is normal. Thought content normal.  Nursing note and vitals reviewed.  ED Treatments / Results  Labs (all labs ordered are listed, but only abnormal results are displayed) Labs Reviewed - No data to display  EKG  EKG Interpretation None       Radiology Ct Lumbar Spine Wo Contrast  Result Date: 10/04/2016 CLINICAL DATA:  History of epidural injection of mid and low back pain since an epidural in October, 2016. EXAM: CT LUMBAR SPINE WITHOUT CONTRAST TECHNIQUE:  Multidetector CT imaging of the lumbar spine was performed without intravenous contrast administration. Multiplanar CT image reconstructions were also generated. COMPARISON:  CT abdomen and pelvis 02/10/2014 and 06/24/2016. FINDINGS: Segmentation: Standard. Alignment: Normal. Vertebrae: Height is maintained.  No focal lesion. Paraspinal and other soft tissues: Status post cholecystectomy. Paraspinous soft tissues are unremarkable. Specifically, no hematoma or other fluid collection to suggest complication related to epidural is identified. Disc levels: The central canal and foramina appear open at all levels. IMPRESSION: Negative examination.  No finding to explain the patient's symptoms. Electronically Signed   By: Drusilla Kanner M.D.   On: 10/04/2016 16:11    Procedures Procedures (including critical care time)  Medications Ordered in ED Medications - No data to display   Initial Impression / Assessment and Plan / ED Course  I have reviewed the triage vital signs and the nursing notes.  Pertinent labs & imaging results that were available during my care of the patient were reviewed by me and considered in my medical decision making (see chart for details).  Clinical Course    Final Clinical Impressions(s) / ED Diagnoses   {I have reviewed and evaluated the relevant imaging studies.  {I have reviewed the relevant previous healthcare records.  {I obtained  HPI from historian. {Patient discussed with supervising physician.  ED Course:  Assessment: Pt is a 40yF with hx CAD, DM, HTN, HLD, neuropathy who presents with back pain x 1 year s/p LP. Likely unrelated. Seen by PCP for this. Struggling with MRI for neurosurgery follow up due to insurance. Pt able to ambulate notes. 1 episode fecal incontinence x 2 weeks ago and 1 episode urinary incontinence x 2 days ago. Both occurred while asleep. No saddle anesthesia. On exam, pt in NAD. Nontoxic/nonseptic appearing. VSS. Afebrile. Lungs CTA. Heart RRR. BLE exam unremarkable. DTRs intact. Motor/senastion intact. Able to ambulate without difficulty. Discussed with supervising physician who has seen and evaluated patient. No acute indication for emergency MRI and transfer from [facility]. Incontinence symptoms inconsistent. Discussed with patient. CT Lumbar ordered and was unremarkable. Will DC home with Robaxin and NSAIDs. Follow up with Ortho. Patient is in no acute distress. Vital Signs are stable. Patient is able to ambulate. Patient able to tolerate PO.   Disposition/Plan:  DC Home Additional Verbal discharge instructions given and discussed with patient.  Pt Instructed to f/u with PCP in the next week for evaluation and treatment of symptoms. Return precautions given Pt acknowledges and agrees with plan  Supervising Physician Vanetta Mulders, MD  Final diagnoses:  Acute bilateral low back pain without sciatica    New Prescriptions New Prescriptions   No medications on file     Audry Pili, PA-C 10/04/16 1633    Vanetta Mulders, MD 10/13/16 [id number]

## 2016-10-04 NOTE — ED Triage Notes (Signed)
C/o mid/lower back pain since LP approx 1 year ago-NAD-slow steady gait

## 2016-10-04 NOTE — Discharge Instructions (Signed)
Please read and follow all provided instructions.  Your diagnoses today include:  1. Acute bilateral low back pain without sciatica    Tests performed today include: Vital signs - see below for your results today  Medications prescribed:   Take any prescribed medications only as directed.  Home care instructions:  Follow any educational materials contained in this packet Please rest, use ice or heat on your back for the next several days Do not lift, push, pull anything more than 10 pounds for the next week  Follow-up instructions: Please follow-up with your primary care provider in the next 1 week for further evaluation of your symptoms.   Return instructions:  SEEK IMMEDIATE MEDICAL ATTENTION IF YOU HAVE: New numbness, tingling, weakness, or problem with the use of your arms or legs Severe back pain not relieved with medications Increasing pain in any areas of the body (such as chest or abdominal pain) Shortness of breath, dizziness, or fainting.  Worsening nausea (feeling sick to your stomach), vomiting, fever, or sweats Any other emergent concerns regarding your health   Additional Information:  Your vital signs today were: BP 97/73 (BP Location: Right Arm)    Pulse 109    Temp 98.4 F (36.9 C) (Oral)    Resp 18    Ht 5\' 1"  (1.549 m)    Wt 109.3 kg    SpO2 97%    BMI 45.54 kg/m  If your blood pressure (BP) was elevated above 135/85 this visit, please have this repeated by your doctor within one month. --------------

## 2017-05-14 ENCOUNTER — Telehealth: Payer: Self-pay | Admitting: Neurology

## 2017-05-14 ENCOUNTER — Ambulatory Visit: Payer: Medicaid Other | Admitting: Neurology

## 2017-05-14 NOTE — Telephone Encounter (Signed)
This patient did not show for a new patient appointment today. 

## 2017-05-15 ENCOUNTER — Encounter: Payer: Self-pay | Admitting: Neurology

## 2018-04-14 IMAGING — CT CT L SPINE W/O CM
3 series · 14 of 33 positions shown, 17 images · non-contrast
Comparison: CT abdomen and pelvis 02/10/2014 and 06/24/2016.

CLINICAL DATA: History of epidural injection of mid and low back
pain since an epidural in July 2015.

EXAM:
CT LUMBAR SPINE WITHOUT CONTRAST
TECHNIQUE: Multidetector CT imaging of the lumbar spine was performed without
intravenous contrast administration. Multiplanar CT image
reconstructions were also generated.

[Series 4: l spine soft · axial · 0.38mm/px · z∈[-182,+0]mm · 6 of 120 slices shown, 8 images]
[im 19/120  soft-tissue]
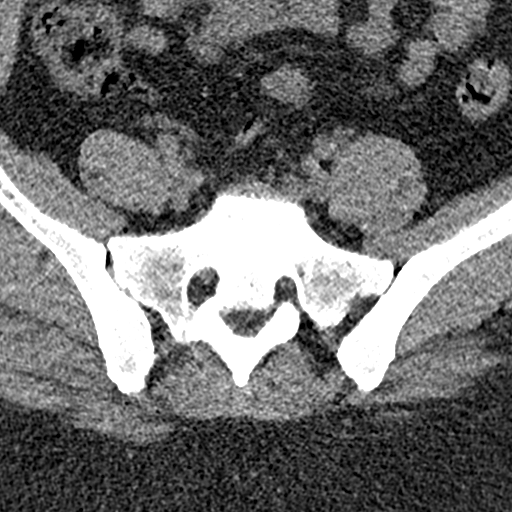
[im 19/120  bone]
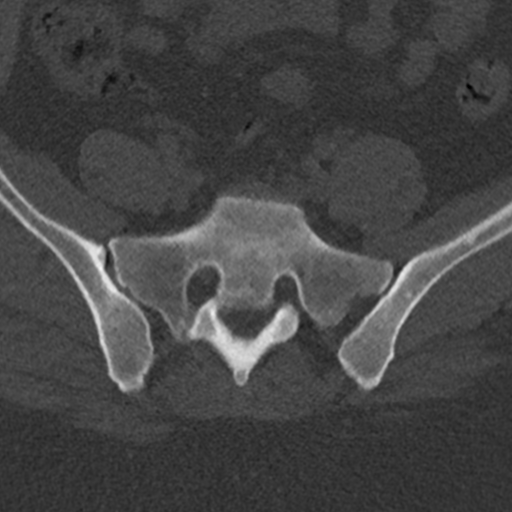
[im 37/120  bone]
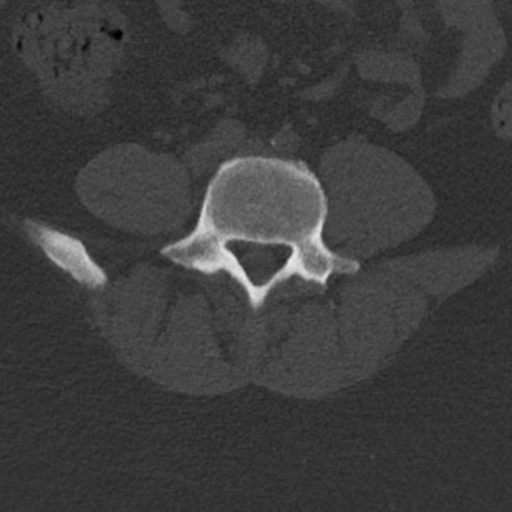
[im 55/120  bone]
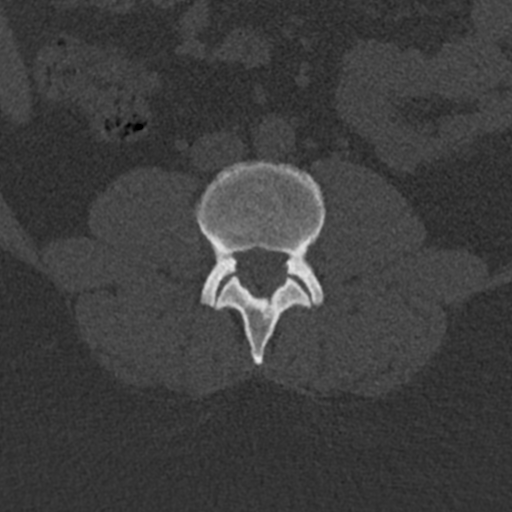
[im 74/120  bone]
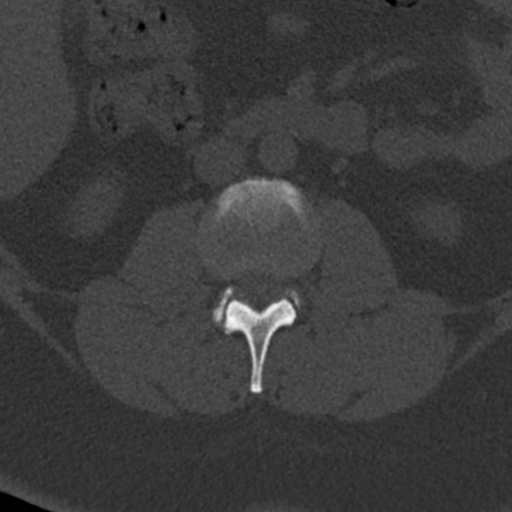
[im 92/120  soft-tissue]
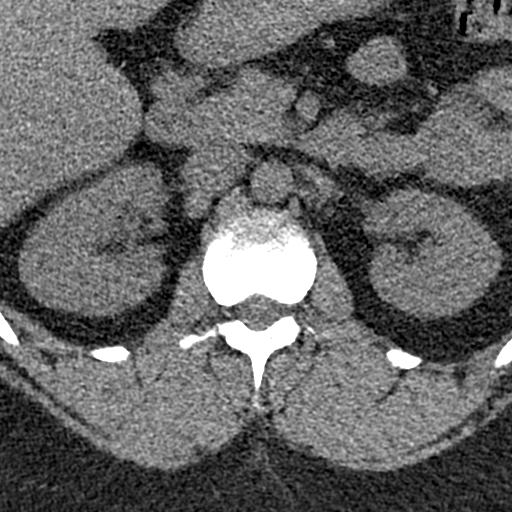
[im 92/120  bone]
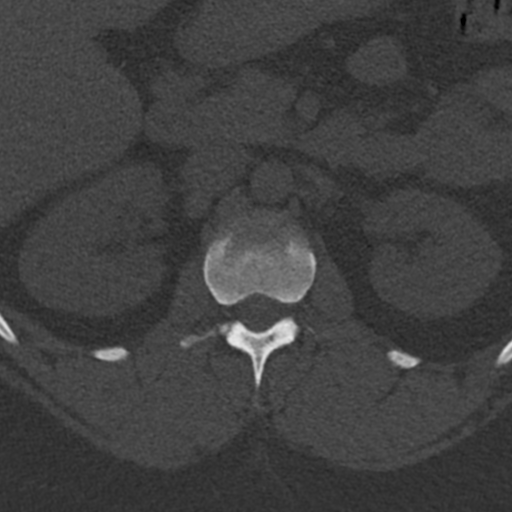
[im 110/120  bone]
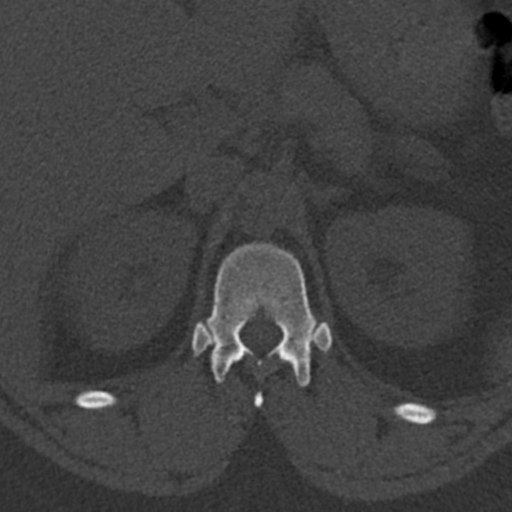

[Series 5: sagittal bone · sagittal · 0.35mm/px · 5 of 80 slices shown, 6 images]
[im 27/80  bone]
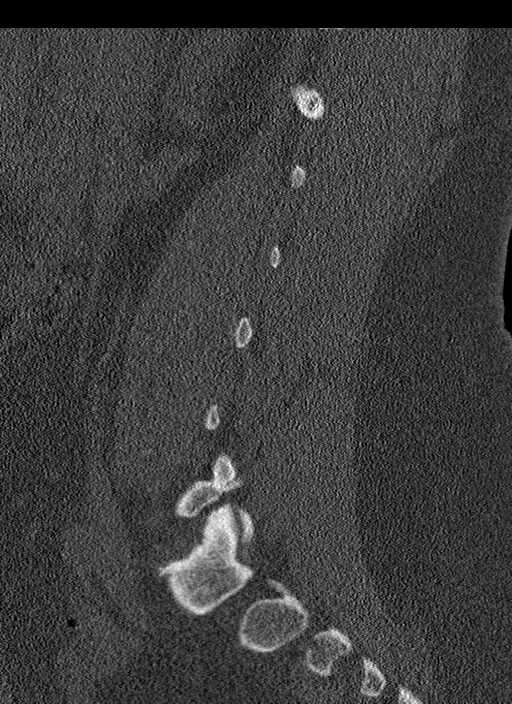
[im 33/80  bone]
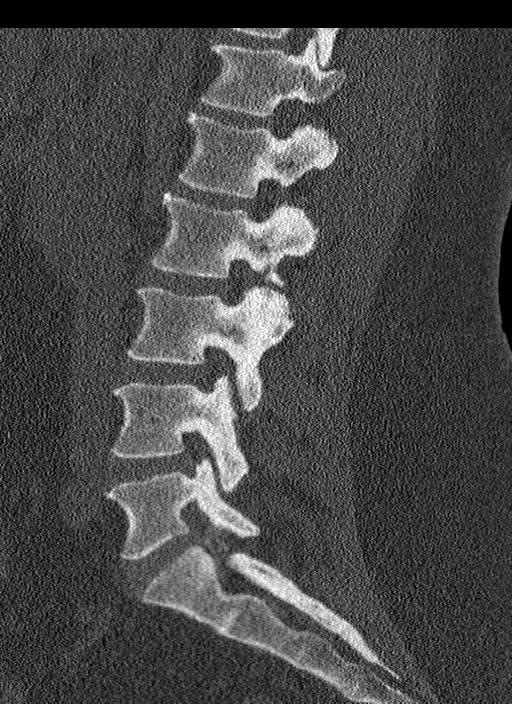
[im 40/80  soft-tissue]
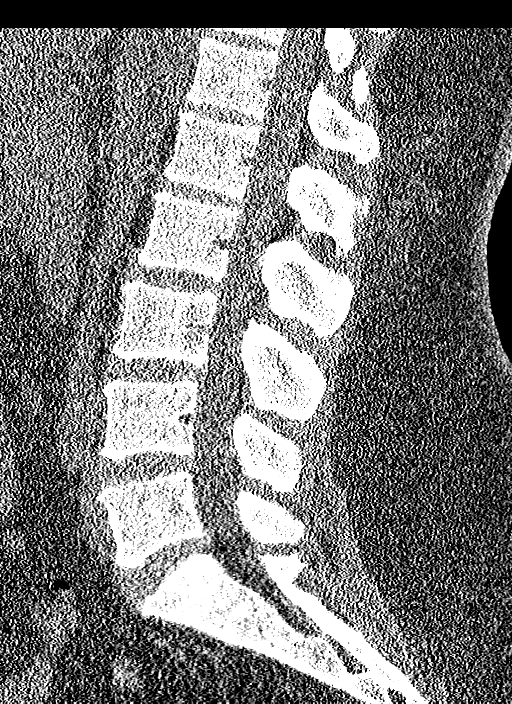
[im 40/80  bone]
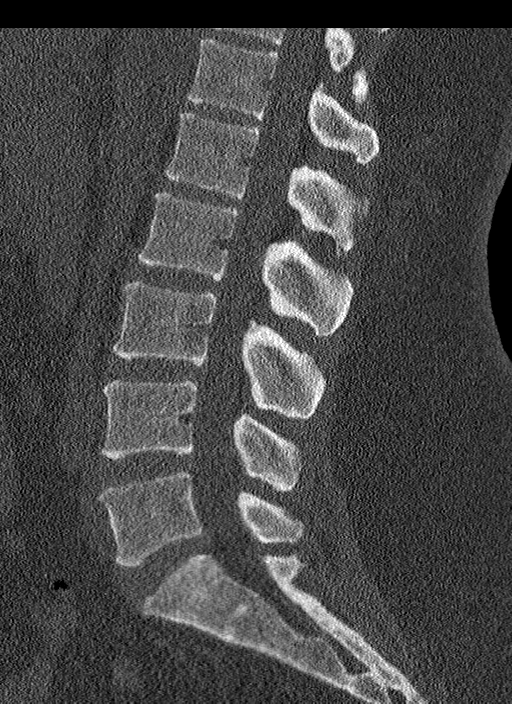
[im 47/80  bone]
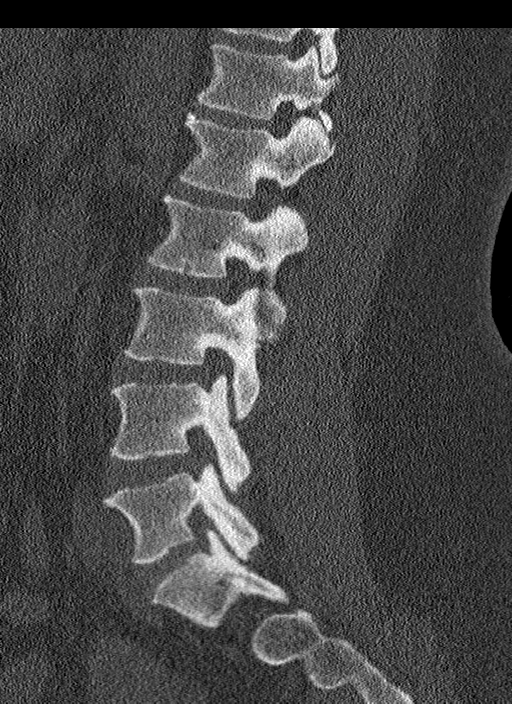
[im 53/80  bone]
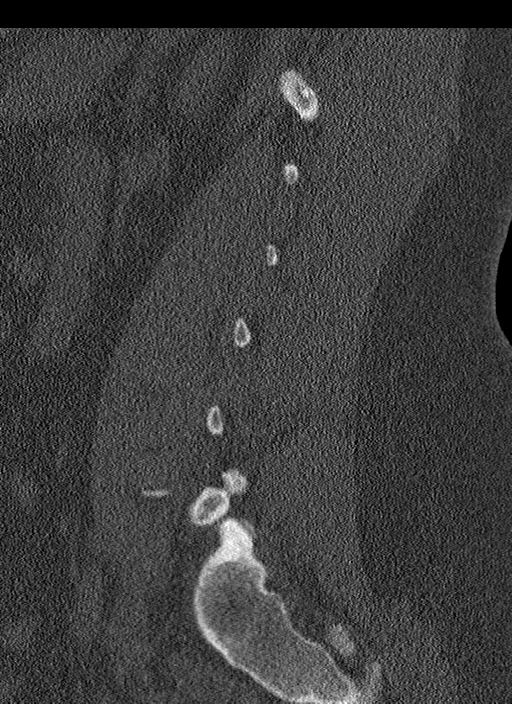

[Series 6: coronal bone · coronal · 0.39mm/px · 3 of 82 slices shown]
[im 17/82  bone]
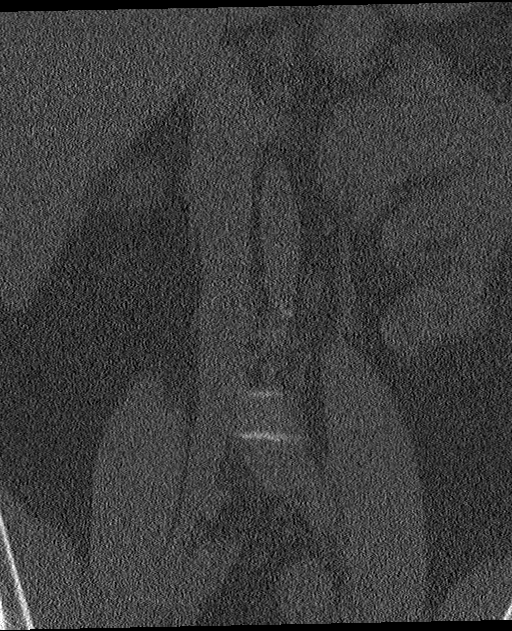
[im 33/82  bone]
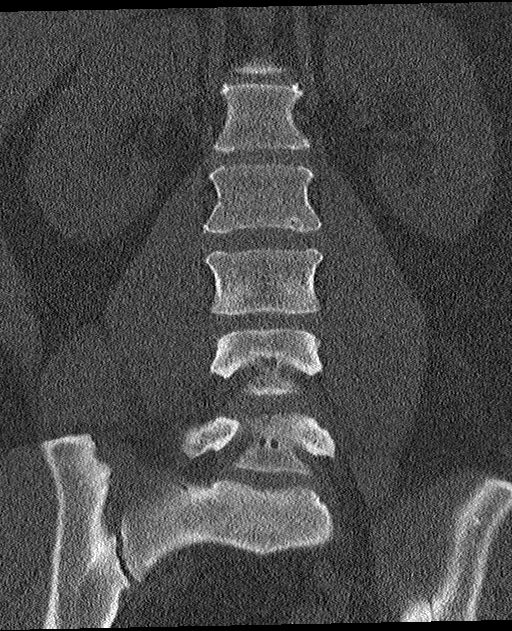
[im 49/82  bone]
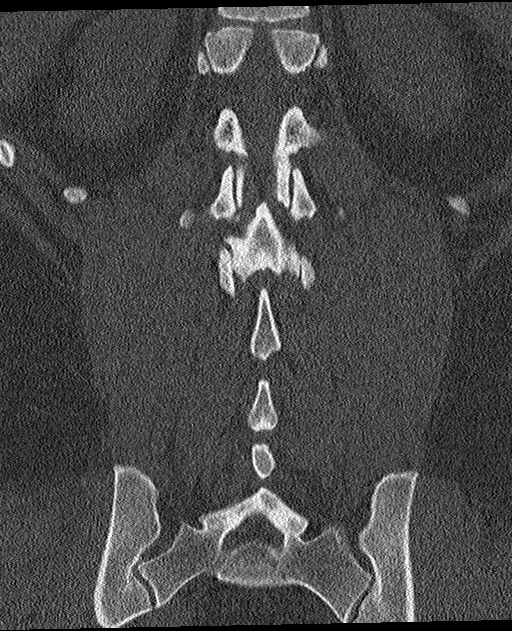

[14 of 33 positions shown; findings below may reference images not displayed]

FINDINGS: Segmentation: Standard.

Alignment: Normal.

Vertebrae: Height is maintained.  No focal lesion.

Paraspinal and other soft tissues: Status post cholecystectomy.
Paraspinous soft tissues are unremarkable. Specifically, no hematoma
or other fluid collection to suggest complication related to
epidural is identified.

Disc levels: The central canal and foramina appear open at all
levels.
IMPRESSION: Negative examination.  No finding to explain the patient's symptoms.

## 2019-09-22 NOTE — Telephone Encounter (Signed)
Pt states she will call back to r/s her NP VV that she had scheduled w/ Dr Brooks Sailors for 10/10/2019. It is being r/s due to provider being out of office

## 2019-10-10 ENCOUNTER — Encounter: Attending: Legal Medicine | Primary: Family

## 2019-10-15 ENCOUNTER — Encounter: Attending: Legal Medicine | Primary: Family

## 2019-10-22 ENCOUNTER — Ambulatory Visit: Attending: Legal Medicine | Primary: Family Medicine

## 2019-10-22 ENCOUNTER — Telehealth

## 2019-10-22 ENCOUNTER — Ambulatory Visit: Admit: 2019-10-22 | Payer: PRIVATE HEALTH INSURANCE | Attending: Legal Medicine | Primary: Family

## 2019-10-22 ENCOUNTER — Ambulatory Visit: Admit: 2019-10-22 | Discharge: 2019-10-22 | Payer: PRIVATE HEALTH INSURANCE | Primary: Family

## 2019-10-22 DIAGNOSIS — E0800 Diabetes mellitus due to underlying condition with hyperosmolarity without nonketotic hyperglycemic-hyperosmolar coma (NKHHC): Secondary | ICD-10-CM

## 2019-10-22 MED ORDER — CYCLOBENZAPRINE 10 MG TAB
10 mg | ORAL_TABLET | Freq: Two times a day (BID) | ORAL | 0 refills | Status: DC | PRN
Start: 2019-10-22 — End: 2020-05-04

## 2019-10-22 MED ORDER — BYDUREON 2 MG/0.65 ML SUBCUTANEOUS PEN INJECTOR
2 mg/0.65 mL | SUBCUTANEOUS | 5 refills | Status: DC
Start: 2019-10-22 — End: 2020-01-14

## 2019-10-22 MED ORDER — ONETOUCH VERIO TEST STRIPS
ORAL_STRIP | 5 refills | Status: DC
Start: 2019-10-22 — End: 2020-04-28

## 2019-10-22 MED ORDER — CLONIDINE 0.1 MG TAB
0.1 mg | ORAL_TABLET | Freq: Two times a day (BID) | ORAL | 0 refills | Status: DC
Start: 2019-10-22 — End: 2020-04-28

## 2019-10-22 MED ORDER — LEVOTHYROXINE 175 MCG TAB
175 mcg | ORAL_TABLET | Freq: Every day | ORAL | 1 refills | Status: DC
Start: 2019-10-22 — End: 2020-07-29

## 2019-10-22 MED ORDER — ATORVASTATIN 20 MG TAB
20 mg | ORAL_TABLET | Freq: Every day | ORAL | 1 refills | Status: DC
Start: 2019-10-22 — End: 2020-07-29

## 2019-10-22 MED ORDER — LABETALOL 200 MG TAB
200 mg | ORAL_TABLET | Freq: Two times a day (BID) | ORAL | 1 refills | Status: DC
Start: 2019-10-22 — End: 2020-05-04

## 2019-10-22 MED ORDER — ONETOUCH VERIO TEST STRIPS
ORAL_STRIP | 5 refills | Status: DC
Start: 2019-10-22 — End: 2020-07-29

## 2019-10-22 MED ORDER — ONETOUCH VERIO TEST STRIPS
ORAL_STRIP | 5 refills | Status: DC
Start: 2019-10-22 — End: 2019-10-22

## 2019-10-22 MED ORDER — METFORMIN 1,000 MG TAB
1000 mg | ORAL_TABLET | Freq: Two times a day (BID) | ORAL | 0 refills | Status: DC
Start: 2019-10-22 — End: 2020-07-29

## 2019-10-22 MED ORDER — JANUVIA 100 MG TABLET
100 mg | ORAL_TABLET | Freq: Every day | ORAL | 1 refills | Status: DC
Start: 2019-10-22 — End: 2019-10-22

## 2019-10-22 MED ORDER — LANCETS
5 refills | Status: DC
Start: 2019-10-22 — End: 2020-07-29

## 2019-10-22 MED ORDER — DILTIAZEM ER 300 MG 24 HR CAP
300 mg | ORAL_CAPSULE | Freq: Every day | ORAL | 1 refills | Status: DC
Start: 2019-10-22 — End: 2020-04-28

## 2019-10-22 NOTE — Progress Notes (Signed)
Christy [name] is a 43 y.o. female (DOB: 25-May-1976) presenting to address:    Chief Complaint   Patient presents with   . New Patient       Vitals:    10/22/19 1313   Resp: 16   Temp: 98.4 F (36.9 C)   TempSrc: Temporal   Weight: 235 lb (106.6 kg)   Height: 5\' 1"  (1.549 m)       Hearing/Vision:   No exam data present    Learning Assessment:   No flowsheet data found.  Depression Screening:     3 most recent PHQ Screens 10/22/2019   Little interest or pleasure in doing things Nearly every day   Feeling down, depressed, irritable, or hopeless Nearly every day   Total Score PHQ 2 6     Fall Risk Assessment:     Fall Risk Assessment, last 12 mths 10/22/2019   Able to walk? Yes   Fall in past 12 months? 0   Do you feel unsteady? 1   Are you worried about falling 1     Abuse Screening:     Abuse Screening Questionnaire 10/22/2019   Do you ever feel afraid of your partner? N   Are you in a relationship with someone who physically or mentally threatens you? N   Is it safe for you to go home? Y     Coordination of Care Questionaire:   1. Have you been to the ER, urgent care clinic since your last visit?  Hospitalized since your last visit?yES    2. Have you seen or consulted any other health care providers outside of the [facility] since your last visit?  Include any pap smears or colon screening. NO    Advanced Directive:   1. Do you have an Advanced Directive? YES    2. Would you like information on Advanced Directives? NO

## 2019-10-22 NOTE — Progress Notes (Signed)
Christy [name]     Chief Complaint   Patient presents with   ??? New Patient     Vitals:    10/22/19 1313   BP: (!) 148/88   Pulse: 96   Resp: 16   Temp: 98.4 ??F (36.9 ??C)   TempSrc: Temporal   SpO2: 99%   Weight: 235 lb (106.6 kg)   Height: 5' 1"  (1.549 m)         UVO:ZDGU is AA female here to establish care with new PCP her old PCP office has closed Dr.Louka Everlean Patterson they moved to [employer] , she moved to [place]  In July 2019  From [place]     She has multiple medical problems     Not seen a doctor in few month out of most of her medications     She is seeing therapist Peggy at [facility], and will see psychiatrics there for bipolar disorder     Past Medical History:   Diagnosis Date   ??? Acid indigestion    ??? Acid reflux    ??? Asthma    ??? CAD (coronary artery disease)    ??? Depression    ??? Diabetes ([place])    ??? Diabetic neuropathy ([place])    ??? 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 Enlarged heart    ??? Hypertension    ??? IBS (irritable bowel syndrome)    ??? Interstitial cystitis    ??? Sciatica    ??? Spinal stenosis      Past Surgical History:   Procedure Laterality Date   ??? HX CHOLECYSTECTOMY      2008   ??? HX ORTHOPAEDIC      LEFT KNEE   ??? HX ORTHOPAEDIC      LEFT ARM   ??? HX PARTIAL HYSTERECTOMY  2003    STILL HAVE OVARIES   ??? HX PELVIC LAPAROSCOPY     ??? HX THYROIDECTOMY       Social History     Tobacco Use   ??? Smoking status: Current Every Day Smoker     Packs/day: 0.25   ??? Smokeless tobacco: Never Used   Substance Use Topics   ??? Alcohol use: Yes     Frequency: 2-4 times a month       Family History   Problem Relation Age of Onset   ??? Diabetes Mother    ??? Heart Disease Mother    ??? Diabetes Father    ??? Cancer Sister    ??? Heart Disease Maternal Aunt    ??? Cancer Paternal Aunt        Review of Systems   Constitutional: Negative for chills, fever, malaise/fatigue and weight loss.   HENT: Negative for congestion, ear discharge, ear pain, hearing loss and nosebleeds.    Eyes: Negative for blurred vision, double  vision and discharge.   Respiratory: Negative for cough, hemoptysis, sputum production, shortness of breath and wheezing.    Cardiovascular: Negative for chest pain, palpitations, claudication and leg swelling.   Gastrointestinal: Positive for heartburn. Negative for abdominal pain, blood in stool, constipation, diarrhea, melena, nausea and vomiting.   Genitourinary: Negative for dysuria, flank pain, frequency, hematuria and urgency.   Musculoskeletal: Positive for back pain, joint pain and neck pain. Negative for falls and myalgias.   Skin: Negative for itching and rash.   Neurological: Positive for tingling and sensory change. Negative for dizziness, speech change, focal weakness, weakness and headaches.  Psychiatric/Behavioral: Positive for depression and hallucinations. Negative for memory loss, substance abuse and suicidal ideas. The patient is not nervous/anxious and does not have insomnia.        Physical Exam  Vitals signs and nursing note reviewed.   Constitutional:       General: She is not in acute distress.     Appearance: She is well-developed. She is not diaphoretic.   HENT:      Head: Normocephalic and atraumatic.   Eyes:      General: No scleral icterus.        Right eye: No discharge.         Left eye: No discharge.      Conjunctiva/sclera: Conjunctivae normal.      Pupils: Pupils are equal, round, and reactive to light.   Neck:      Thyroid: No thyromegaly.   Cardiovascular:      Rate and Rhythm: Normal rate and regular rhythm.      Heart sounds: Normal heart sounds.   Pulmonary:      Effort: Pulmonary effort is normal. No respiratory distress.      Breath sounds: Normal breath sounds. No wheezing or rales.   Chest:      Chest wall: No tenderness.   Abdominal:      General: There is no distension.      Palpations: Abdomen is soft.      Tenderness: There is no abdominal tenderness. There is no rebound.   Musculoskeletal: Normal range of motion.         General: Tenderness present. No deformity.       Comments: Low back mild tenderness on palpation    Lymphadenopathy:      Cervical: No cervical adenopathy.   Skin:     General: Skin is warm and dry.      Coloration: Skin is not pale.      Findings: No erythema or rash.   Neurological:      Mental Status: She is alert and oriented to person, place, and time.      Cranial Nerves: No cranial nerve deficit.      Coordination: Coordination normal.   Psychiatric:         Behavior: Behavior normal.         Thought Content: Thought content normal.         Judgment: Judgment normal.          Assessment and plan     Plan of care has been discussed with the patient, he agrees to the plan and verbalized understanding.  All his questions were answered  More than 50% of the time spent in this visit was counseling the patient about  illness and treatment options         1. Obesity, morbid ([place])    - REFERRAL TO PHYSICAL THERAPY    2. Coronary artery disease involving native coronary artery of native heart without angina pectoris      3. Gastroesophageal reflux disease with esophagitis without hemorrhage  Stable on current medication     4. Hyperlipidemia with target low density lipoprotein (LDL) cholesterol less than 70 mg/dL    - LIPID PANEL; Future  - atorvastatin (Lipitor) 20 mg tablet; Take 1 Tab by mouth daily for 90 days.  Dispense: 90 Tab; Refill: 1    5. Essential hypertension    - cloNIDine HCL (CATAPRES) 0.1 mg tablet; Take 1 Tab by mouth two (2) times a day for 90 days.  Dispense: 180 Tab; Refill: 0  - dilTIAZem ER (CARDIZEM CD) 300 mg capsule; Take 1 Cap by mouth daily for 90 days. oNCE A DAY  Dispense: 90 Cap; Refill: 1  - labetaloL (NORMODYNE) 200 mg tablet; Take 1 Tab by mouth two (2) times a day for 90 days. TWICE A DAY  Dispense: 180 Tab; Refill: 1    6. Spinal stenosis of lumbar region, unspecified whether neurogenic claudication present    - REFERRAL TO PAIN MANAGEMENT    7. Diabetes mellitus due to underlying condition with hyperosmolarity without coma,  unspecified whether long term insulin use (HCC)    - METABOLIC PANEL, COMPREHENSIVE; Future  - LIPID PANEL; Future  - CBC WITH AUTOMATED DIFF; Future  - MICROALBUMIN, UR, RAND W/ MICROALB/CREAT RATIO; Future  - HEMOGLOBIN A1C W/O EAG; Future  - REFERRAL TO OPHTHALMOLOGY  - REFERRAL TO PODIATRY  - REFERRAL TO PHYSICAL THERAPY  - Bydureon 2 mg/0.65 mL pnij; 0.65 mL by abdominal subcutaneous route every seven (7) days for 30 doses. ONCE A WEEK  Dispense: 4 Adjustable Dose Pre-filled Pen Syringe; Refill: 5  - metFORMIN (GLUCOPHAGE) 1,000 mg tablet; Take 1 Tab by mouth two (2) times daily (with meals) for 90 days.  Dispense: 180 Tab; Refill: 0    8. Acquired hypothyroidism    - TSH AND FREE T4; Future  - levothyroxine (SYNTHROID) 175 mcg tablet; Take 1 Tab by mouth Daily (before breakfast) for 90 days. ONCE A DAY 30 MINUTES BEFORE MEALS  Dispense: 90 Tab; Refill: 1    9. Bipolar affective disorder, current episode hypomanic ([place])      10. Migraine without aura and without status migrainosus, not intractable    - REFERRAL TO NEUROLOGY    11. Encounter for immunization  Vaccine was given no complications   - PNEUMOCOCCAL POLYSACCHARIDE VACCINE, 23-VALENT, ADULT OR IMMUNOSUPPRESSED PT DOSE,    12. Sciatic leg pain    - REFERRAL TO PAIN MANAGEMENT  - cyclobenzaprine (FLEXERIL) 10 mg tablet; Take 1 Tab by mouth two (2) times daily as needed for Muscle Spasm(s) for up to 60 days.  Dispense: 60 Tab; Refill: 0    13. Muscle spasm    - cyclobenzaprine (FLEXERIL) 10 mg tablet; Take 1 Tab by mouth two (2) times daily as needed for Muscle Spasm(s) for up to 60 days.  Dispense: 60 Tab; Refill: 0    Current Outpatient Medications   Medication Sig Dispense Refill   ??? ARIPiprazole (ABILIFY) 5 mg tablet 5 mg daily.     ??? aspirin delayed-release 81 mg tablet 81 mg.     ??? clonazePAM (KlonoPIN) 1 mg tablet 1 mg three (3) times daily.     ??? lamoTRIgine (LaMICtal) 25 mg tablet 50 mg. oNCE A DAY     ??? lidocaine (LIDODERM) 5 % Apply 1 patch as  directed for 12 hours every 24 hours (12 hours on, 12 hours off)     ??? cloNIDine HCL (CATAPRES) 0.1 mg tablet Take 1 Tab by mouth two (2) times a day for 90 days. 180 Tab 0   ??? dilTIAZem ER (CARDIZEM CD) 300 mg capsule Take 1 Cap by mouth daily for 90 days. oNCE A DAY 90 Cap 1   ??? Bydureon 2 mg/0.65 mL pnij 0.65 mL by abdominal subcutaneous route every seven (7) days for 30 doses. ONCE A WEEK 4 Adjustable Dose Pre-filled Pen Syringe 5   ??? labetaloL (NORMODYNE) 200 mg tablet Take 1 Tab by mouth two (2) times  a day for 90 days. TWICE A DAY 180 Tab 1   ??? levothyroxine (SYNTHROID) 175 mcg tablet Take 1 Tab by mouth Daily (before breakfast) for 90 days. ONCE A DAY 30 MINUTES BEFORE MEALS 90 Tab 1   ??? atorvastatin (Lipitor) 20 mg tablet Take 1 Tab by mouth daily for 90 days. 90 Tab 1   ??? metFORMIN (GLUCOPHAGE) 1,000 mg tablet Take 1 Tab by mouth two (2) times daily (with meals) for 90 days. 180 Tab 0   ??? cyclobenzaprine (FLEXERIL) 10 mg tablet Take 1 Tab by mouth two (2) times daily as needed for Muscle Spasm(s) for up to 60 days. 60 Tab 0   ??? ondansetron hcl (ZOFRAN) 4 mg tablet Take 1 Tab by mouth every eight (8) hours as needed for Nausea. 12 Tab 0   ??? Hydrochlorothiazide 12.5 mg tablet Take 12.5 mg by mouth daily. 30 Tab 5   ??? zolpidem (AMBIEN) 10 mg tablet Take  by mouth nightly as needed for Sleep.     ??? omeprazole (PRILOSEC) 20 mg capsule Take 1 Cap by mouth two (2) times a day. 60 Cap 6   ??? dicyclomine (BENTYL) 10 mg capsule Take 1 Cap by mouth three (3) times daily. PRN 30 Cap 0   ??? glucose blood VI test strips (ASCENSIA AUTODISC VI, ONE TOUCH ULTRA TEST VI) strip by Does Not Apply route See Admin Instructions.     ??? HYDROcodone-acetaminophen (NORCO) 5-325 mg per tablet Take 1-2 tablets PO every 4-6 hours as needed for pain control.  If over the counter ibuprofen or acetaminophen was suggested, then only take the vicodin for pain not well controlled with the over the counter medication. 12 Tab 0   ??? gabapentin  (NEURONTIN) 600 mg tablet Take 1 Tab by mouth three (3) times daily. (Patient taking differently: Take 300 mg by mouth three (3) times daily. 4 X A DAY) 90 Tab 4   ??? irbesartan (AVAPRO) 150 mg tablet Take 1 Tab by mouth nightly. 30 Tab 5       Patient Active Problem List    Diagnosis Date Noted   ??? Obesity, morbid ([place]) 10/22/2019   ??? Spinal stenosis    ??? IBS (irritable bowel syndrome)    ??? Chest pain 12/10/2012   ??? Dizziness 12/10/2012   ??? CAD (coronary artery disease) 06/06/2011   ??? Hyperlipidemia with target low density lipoprotein (LDL) cholesterol less than 70 mg/dL 06/06/2011   ??? Hypertension 06/06/2011   ??? Obesity 06/06/2011   ??? DM (diabetes mellitus) ([place]) 06/06/2011   ??? GERD (gastroesophageal reflux disease) 06/06/2011     Results for orders placed or performed during the hospital encounter of 02/19/13   CBC WITH AUTOMATED DIFF   Result Value Ref Range    WBC 11.0 4.6 - 13.2 K/uL    RBC 4.64 4.20 - 5.30 M/uL    HGB 13.2 12.0 - 16.0 g/dL    HCT 39.7 35.0 - 45.0 %    MCV 85.6 74.0 - 97.0 FL    MCH 28.4 24.0 - 34.0 PG    MCHC 33.2 31.0 - 37.0 g/dL    RDW 12.9 11.6 - 14.5 %    PLATELET 261 135 - 420 K/uL    MPV 12.1 (H) 9.2 - 11.8 FL    NEUTROPHILS 84 (H) 40 - 73 %    LYMPHOCYTES 12 (L) 21 - 52 %    MONOCYTES 3 3 - 10 %    EOSINOPHILS 1 0 -  5 %    BASOPHILS 0 0 - 2 %    ABS. NEUTROPHILS 9.2 (H) 1.8 - 8.0 K/UL    ABS. LYMPHOCYTES 1.3 0.9 - 3.6 K/UL    ABS. MONOCYTES 0.3 0.05 - 1.2 K/UL    ABS. EOSINOPHILS 0.2 0.0 - 0.4 K/UL    ABS. BASOPHILS 0.0 0.0 - 0.1 K/UL    DF AUTOMATED    METABOLIC PANEL, COMPREHENSIVE   Result Value Ref Range    Sodium 136 136 - 145 mmol/L    Potassium 4.0 3.5 - 5.5 mmol/L    Chloride 105 100 - 108 mmol/L    CO2 24 21 - 32 mmol/L    Anion gap 7 3.0 - 18 mmol/L    Glucose 157 (H) 74 - 99 mg/dL    BUN 15 7.0 - 18 MG/DL    Creatinine 0.81 0.6 - 1.3 MG/DL    BUN/Creatinine ratio 19 12 - 20      GFR est AA >60 >60 ml/min/1.75m    GFR est non-AA >60 >60 ml/min/1.781m   Calcium 8.9 8.5 - 10.1  MG/DL    Bilirubin, total 0.4 0.2 - 1.0 MG/DL    ALT (SGPT) 29 12.0 - 78.0 U/L    AST (SGOT) 12 (L) 15 - 37 U/L    Alk. phosphatase 76 45 - 117 U/L    Protein, total 8.3 (H) 6.4 - 8.2 g/dL    Albumin 3.9 3.4 - 5.0 g/dL    Globulin 4.4 (H) 2.0 - 4.0 g/dL    A-G Ratio 0.9 0.8 - 1.7     LIPASE   Result Value Ref Range    Lipase 124 73 - 393 U/L   MAGNESIUM   Result Value Ref Range    Magnesium 1.7 (L) 1.8 - 2.4 mg/dL   CARDIAC PANEL,(CK, CKMB & TROPONIN)   Result Value Ref Range    CK 80 26 - 192 U/L    CK - MB <0.5 (L) 0.5 - 3.6 ng/ml    CK-MB Index CANNOT BE CALCULATED 0.0 - 4.0 %    Troponin-I, QT <0.02 0.0 - 0.045 NG/ML   URINALYSIS W/ RFLX MICROSCOPIC   Result Value Ref Range    Color YELLOW     Appearance CLEAR     Specific gravity >1.030 (H) 1.003 - 1.030      pH (UA) 6.0 5.0 - 8.0      Protein TRACE (A) NEGATIVE mg/dL    Glucose NEGATIVE  NEGATIVE mg/dL    Ketone NEGATIVE  NEGATIVE mg/dL    Bilirubin NEGATIVE  NEGATIVE    Blood NEGATIVE  NEGATIVE    Urobilinogen 0.2 0.2 - 1.0 EU/dL    Nitrites NEGATIVE  NEGATIVE    Leukocyte Esterase NEGATIVE  NEGATIVE   URINE MICROSCOPIC ONLY   Result Value Ref Range    WBC 0 to 2 0 - 4 /hpf    RBC 0 to 1 0 - 5 /hpf    Epithelial cells FEW 0 - 5 /lpf    Bacteria FEW (A) NEGATIVE /hpf    Mucus 1+ (A) NEGATIVE /lpf   EKG, 12 LEAD, INITIAL   Result Value Ref Range    Ventricular Rate 108 BPM    Atrial Rate 108 BPM    P-R Interval 148 ms    QRS Duration 80 ms    Q-T Interval 344 ms    QTC Calculation (Bezet) 460 ms    Calculated P Axis 46 degrees  Calculated R Axis 35 degrees    Calculated T Axis 33 degrees    Diagnosis       Sinus tachycardia  Otherwise normal ECG  When compared with ECG of 10-Dec-2012 14:12,  No significant change was found  Confirmed by [id number], Fayne Norrie MD ([id number]) on 02/19/2013 5:12:22 PM     No visits with results within 3 Month(s) from this visit.   Latest known visit with results is:   Admission on 02/19/2013, Discharged on 02/19/2013   Component Date  Value Ref Range Status   ??? WBC 02/19/2013 11.0  4.6 - 13.2 K/uL Final   ??? RBC 02/19/2013 4.64  4.20 - 5.30 M/uL Final   ??? HGB 02/19/2013 13.2  12.0 - 16.0 g/dL Final   ??? HCT 02/19/2013 39.7  35.0 - 45.0 % Final   ??? MCV 02/19/2013 85.6  74.0 - 97.0 FL Final   ??? MCH 02/19/2013 28.4  24.0 - 34.0 PG Final   ??? MCHC 02/19/2013 33.2  31.0 - 37.0 g/dL Final   ??? RDW 02/19/2013 12.9  11.6 - 14.5 % Final   ??? PLATELET 02/19/2013 261  135 - 420 K/uL Final   ??? MPV 02/19/2013 12.1* 9.2 - 11.8 FL Final   ??? NEUTROPHILS 02/19/2013 84* 40 - 73 % Final   ??? LYMPHOCYTES 02/19/2013 12* 21 - 52 % Final   ??? MONOCYTES 02/19/2013 3  3 - 10 % Final   ??? EOSINOPHILS 02/19/2013 1  0 - 5 % Final   ??? BASOPHILS 02/19/2013 0  0 - 2 % Final   ??? ABS. NEUTROPHILS 02/19/2013 9.2* 1.8 - 8.0 K/UL Final   ??? ABS. LYMPHOCYTES 02/19/2013 1.3  0.9 - 3.6 K/UL Final   ??? ABS. MONOCYTES 02/19/2013 0.3  0.05 - 1.2 K/UL Final   ??? ABS. EOSINOPHILS 02/19/2013 0.2  0.0 - 0.4 K/UL Final   ??? ABS. BASOPHILS 02/19/2013 0.0  0.0 - 0.1 K/UL Final   ??? DF 02/19/2013 AUTOMATED   Final   ??? Sodium 02/19/2013 136  136 - 145 mmol/L Final   ??? Potassium 02/19/2013 4.0  3.5 - 5.5 mmol/L Final   ??? Chloride 02/19/2013 105  100 - 108 mmol/L Final   ??? CO2 02/19/2013 24  21 - 32 mmol/L Final   ??? Anion gap 02/19/2013 7  3.0 - 18 mmol/L Final   ??? Glucose 02/19/2013 157* 74 - 99 mg/dL Final   ??? BUN 02/19/2013 15  7.0 - 18 MG/DL Final   ??? Creatinine 02/19/2013 0.81  0.6 - 1.3 MG/DL Final   ??? BUN/Creatinine ratio 02/19/2013 19  12 - 20   Final   ??? GFR est AA 02/19/2013 >60  >60 ml/min/1.43m Final   ??? GFR est non-AA 02/19/2013 >60  >60 ml/min/1.748mFinal    Comment: (NOTE)                           Estimated GFR is calculated using the Modification of Diet in Renal                            Disease (MDRD) Study equation, reported for both African Americans                            (GFRAA) and non-African Americans (GFRNA), and normalized to 1.7327m  body surface  area. The physician must decide which value applies to                            the patient. The MDRD study equation should only be used in                            individuals age 37 or older. It has not been validated for the                            following: pregnant women, patients with serious comorbid conditions,                            or on certain medications, or persons with extremes of body size,                            muscle mass, or nutritional status.   ??? Calcium 02/19/2013 8.9  8.5 - 10.1 MG/DL Final   ??? Bilirubin, total 02/19/2013 0.4  0.2 - 1.0 MG/DL Final   ??? ALT (SGPT) 02/19/2013 29  12.0 - 78.0 U/L Final   ??? AST (SGOT) 02/19/2013 12* 15 - 37 U/L Final   ??? Alk. phosphatase 02/19/2013 76  45 - 117 U/L Final    ** Note new reference range and method **   ??? Protein, total 02/19/2013 8.3* 6.4 - 8.2 g/dL Final   ??? Albumin 02/19/2013 3.9  3.4 - 5.0 g/dL Final   ??? Globulin 02/19/2013 4.4* 2.0 - 4.0 g/dL Final   ??? A-G Ratio 02/19/2013 0.9  0.8 - 1.7   Final   ??? Lipase 02/19/2013 124  73 - 393 U/L Final   ??? Magnesium 02/19/2013 1.7* 1.8 - 2.4 mg/dL Final   ??? CK 02/19/2013 80  26 - 192 U/L Final   ??? CK - MB 02/19/2013 <0.5* 0.5 - 3.6 ng/ml Final   ??? CK-MB Index 02/19/2013 CANNOT BE CALCULATED  0.0 - 4.0 % Final   ??? Troponin-I, QT 02/19/2013 <0.02  0.0 - 0.045 NG/ML Final    Comment: (NOTE)                           The presence of detectable troponin above the reference range                            indicates myocardial injury which may be due to ischemia,                            myocarditis, trauma, etc. Clinical correlation is necessary to                            determine the significance of this finding. Sequential testing is                            recommended to determine if the typical rise and fall of cTnI is  demonstrated.                           Note: cardiac troponin-I has a relatively long half-life                           and may be  present well after the CK MB has returned to baseline.                            0.09 to 0.59 ng/mL:indeterminant for myocardial infarction.                           0.60 to 1.50 ng/mL:consistent with WHO criteria for myocardial                                              infarction.   ??? Color 02/19/2013 YELLOW   Final   ??? Appearance 02/19/2013 CLEAR   Final   ??? Specific gravity 02/19/2013 >1.030* 1.003 - 1.030   Final   ??? pH (UA) 02/19/2013 6.0  5.0 - 8.0   Final   ??? Protein 02/19/2013 TRACE* NEGATIVE mg/dL Final   ??? Glucose 02/19/2013 NEGATIVE   NEGATIVE mg/dL Final   ??? Ketone 02/19/2013 NEGATIVE   NEGATIVE mg/dL Final   ??? Bilirubin 02/19/2013 NEGATIVE   NEGATIVE Final   ??? Blood 02/19/2013 NEGATIVE   NEGATIVE Final   ??? Urobilinogen 02/19/2013 0.2  0.2 - 1.0 EU/dL Final   ??? Nitrites 02/19/2013 NEGATIVE   NEGATIVE Final   ??? Leukocyte Esterase 02/19/2013 NEGATIVE   NEGATIVE Final   ??? Ventricular Rate 02/19/2013 108  BPM Final   ??? Atrial Rate 02/19/2013 108  BPM Final   ??? P-R Interval 02/19/2013 148  ms Final   ??? QRS Duration 02/19/2013 80  ms Final   ??? Q-T Interval 02/19/2013 344  ms Final   ??? QTC Calculation (Bezet) 02/19/2013 460  ms Final   ??? Calculated P Axis 02/19/2013 46  degrees Final   ??? Calculated R Axis 02/19/2013 35  degrees Final   ??? Calculated T Axis 02/19/2013 33  degrees Final   ??? Diagnosis 02/19/2013    Final                    Value:Sinus tachycardia                          Otherwise normal ECG                          When compared with ECG of 10-Dec-2012 14:12,                          No significant change was found                          Confirmed by [id number], Fayne Norrie MD ([id number]) on 02/19/2013 5:12:22 PM   ??? WBC 02/19/2013 0 to 2  0 - 4 /hpf Final   ??? RBC 02/19/2013 0 to 1  0 - 5 /hpf Final   ???  Epithelial cells 02/19/2013 FEW  0 - 5 /lpf Final   ??? Bacteria 02/19/2013 FEW* NEGATIVE /hpf Final   ??? Mucus 02/19/2013 1+* NEGATIVE /lpf Final          Follow-up and Dispositions    ?? Return in  about 2 weeks (around 11/05/2019).

## 2019-10-22 NOTE — Progress Notes (Signed)
Will be discussed  In the coming appointment

## 2019-10-22 NOTE — Telephone Encounter (Signed)
Patient stated that she need refill on Test strips and Lancets. Patient meter is a [employer].

## 2019-10-22 NOTE — Progress Notes (Signed)
Will be discussed  In the coming appointment

## 2019-10-22 NOTE — Progress Notes (Signed)
Christy [name]     Chief Complaint   Patient presents with   ??? New Patient     Vitals:    10/22/19 1313   BP: (!) 148/88   Pulse: 96   Resp: 16   Temp: 98.4 ??F (36.9 ??C)   TempSrc: Temporal   SpO2: 99%   Weight: 235 lb (106.6 kg)   Height: 5' 1"  (1.549 m)         RUE:AVWU is AA female here to establish care with new PCP her old PCP office has closed Dr.Louka Everlean Patterson they moved to [employer] , she moved to [place]  In July 2019  From [place]     She has multiple medical problems     Not seen a doctor in few month out of most of her medications     She is seeing therapist Peggy at [facility], and will see psychiatrics there for bipolar disorder     Past Medical History:   Diagnosis Date   ??? Acid indigestion    ??? Acid reflux    ??? Asthma    ??? CAD (coronary artery disease)    ??? Depression    ??? Diabetes ([place])    ??? Diabetic neuropathy ([place])    ??? 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 Enlarged heart    ??? Hypertension    ??? IBS (irritable bowel syndrome)    ??? Interstitial cystitis    ??? Sciatica    ??? Spinal stenosis      Past Surgical History:   Procedure Laterality Date   ??? HX CHOLECYSTECTOMY      2008   ??? HX ORTHOPAEDIC      LEFT KNEE   ??? HX ORTHOPAEDIC      LEFT ARM   ??? HX PARTIAL HYSTERECTOMY  2003    STILL HAVE OVARIES   ??? HX PELVIC LAPAROSCOPY     ??? HX THYROIDECTOMY       Social History     Tobacco Use   ??? Smoking status: Current Every Day Smoker     Packs/day: 0.25   ??? Smokeless tobacco: Never Used   Substance Use Topics   ??? Alcohol use: Yes     Frequency: 2-4 times a month       Family History   Problem Relation Age of Onset   ??? Diabetes Mother    ??? Heart Disease Mother    ??? Diabetes Father    ??? Cancer Sister    ??? Heart Disease Maternal Aunt    ??? Cancer Paternal Aunt        Review of Systems   Constitutional: Negative for chills, fever, malaise/fatigue and weight loss.   HENT: Negative for congestion, ear discharge, ear pain, hearing loss and nosebleeds.     Eyes: Negative for blurred vision, double vision and discharge.   Respiratory: Negative for cough, hemoptysis, sputum production, shortness of breath and wheezing.    Cardiovascular: Negative for chest pain, palpitations, claudication and leg swelling.   Gastrointestinal: Positive for heartburn. Negative for abdominal pain, blood in stool, constipation, diarrhea, melena, nausea and vomiting.   Genitourinary: Negative for dysuria, flank pain, frequency, hematuria and urgency.   Musculoskeletal: Positive for back pain, joint pain and neck pain. Negative for falls and myalgias.   Skin: Negative for itching and rash.   Neurological: Positive for tingling and sensory change. Negative for dizziness, speech change, focal weakness, weakness and headaches.  Psychiatric/Behavioral: Positive for depression and hallucinations. Negative for memory loss, substance abuse and suicidal ideas. The patient is not nervous/anxious and does not have insomnia.        Physical Exam  Vitals signs and nursing note reviewed.   Constitutional:       General: She is not in acute distress.     Appearance: She is well-developed. She is not diaphoretic.   HENT:      Head: Normocephalic and atraumatic.   Eyes:      General: No scleral icterus.        Right eye: No discharge.         Left eye: No discharge.      Conjunctiva/sclera: Conjunctivae normal.      Pupils: Pupils are equal, round, and reactive to light.   Neck:      Thyroid: No thyromegaly.   Cardiovascular:      Rate and Rhythm: Normal rate and regular rhythm.      Heart sounds: Normal heart sounds.   Pulmonary:      Effort: Pulmonary effort is normal. No respiratory distress.      Breath sounds: Normal breath sounds. No wheezing or rales.   Chest:      Chest wall: No tenderness.   Abdominal:      General: There is no distension.      Palpations: Abdomen is soft.      Tenderness: There is no abdominal tenderness. There is no rebound.   Musculoskeletal: Normal range of motion.          General: Tenderness present. No deformity.      Comments: Low back mild tenderness on palpation    Lymphadenopathy:      Cervical: No cervical adenopathy.   Skin:     General: Skin is warm and dry.      Coloration: Skin is not pale.      Findings: No erythema or rash.   Neurological:      Mental Status: She is alert and oriented to person, place, and time.      Cranial Nerves: No cranial nerve deficit.      Coordination: Coordination normal.   Psychiatric:         Behavior: Behavior normal.         Thought Content: Thought content normal.         Judgment: Judgment normal.          Assessment and plan     Plan of care has been discussed with the patient, he agrees to the plan and verbalized understanding.  All his questions were answered  More than 50% of the time spent in this visit was counseling the patient about  illness and treatment options         1. Obesity, morbid ([facility])    - REFERRAL TO PHYSICAL THERAPY    2. Coronary artery disease involving native coronary artery of native heart without angina pectoris      3. Gastroesophageal reflux disease with esophagitis without hemorrhage  Stable on current medication     4. Hyperlipidemia with target low density lipoprotein (LDL) cholesterol less than 70 mg/dL    - LIPID PANEL; Future  - atorvastatin (Lipitor) 20 mg tablet; Take 1 Tab by mouth daily for 90 days.  Dispense: 90 Tab; Refill: 1    5. Essential hypertension    - cloNIDine HCL (CATAPRES) 0.1 mg tablet; Take 1 Tab by mouth two (2) times a day for 90 days.  Dispense: 180 Tab; Refill: 0  - dilTIAZem ER (CARDIZEM CD) 300 mg capsule; Take 1 Cap by mouth daily for 90 days. oNCE A DAY  Dispense: 90 Cap; Refill: 1  - labetaloL (NORMODYNE) 200 mg tablet; Take 1 Tab by mouth two (2) times a day for 90 days. TWICE A DAY  Dispense: 180 Tab; Refill: 1    6. Spinal stenosis of lumbar region, unspecified whether neurogenic claudication present    - REFERRAL TO PAIN MANAGEMENT     7. Diabetes mellitus due to underlying condition with hyperosmolarity without coma, unspecified whether long term insulin use (HCC)    - METABOLIC PANEL, COMPREHENSIVE; Future  - LIPID PANEL; Future  - CBC WITH AUTOMATED DIFF; Future  - MICROALBUMIN, UR, RAND W/ MICROALB/CREAT RATIO; Future  - HEMOGLOBIN A1C W/O EAG; Future  - REFERRAL TO OPHTHALMOLOGY  - REFERRAL TO PODIATRY  - REFERRAL TO PHYSICAL THERAPY  - Bydureon 2 mg/0.65 mL pnij; 0.65 mL by abdominal subcutaneous route every seven (7) days for 30 doses. ONCE A WEEK  Dispense: 4 Adjustable Dose Pre-filled Pen Syringe; Refill: 5  - metFORMIN (GLUCOPHAGE) 1,000 mg tablet; Take 1 Tab by mouth two (2) times daily (with meals) for 90 days.  Dispense: 180 Tab; Refill: 0    8. Acquired hypothyroidism    - TSH AND FREE T4; Future  - levothyroxine (SYNTHROID) 175 mcg tablet; Take 1 Tab by mouth Daily (before breakfast) for 90 days. ONCE A DAY 30 MINUTES BEFORE MEALS  Dispense: 90 Tab; Refill: 1    9. Bipolar affective disorder, current episode hypomanic ([place])      10. Migraine without aura and without status migrainosus, not intractable    - REFERRAL TO NEUROLOGY    11. Encounter for immunization  Vaccine was given no complications   - PNEUMOCOCCAL POLYSACCHARIDE VACCINE, 23-VALENT, ADULT OR IMMUNOSUPPRESSED PT DOSE,    12. Sciatic leg pain    - REFERRAL TO PAIN MANAGEMENT  - cyclobenzaprine (FLEXERIL) 10 mg tablet; Take 1 Tab by mouth two (2) times daily as needed for Muscle Spasm(s) for up to 60 days.  Dispense: 60 Tab; Refill: 0    13. Muscle spasm    - cyclobenzaprine (FLEXERIL) 10 mg tablet; Take 1 Tab by mouth two (2) times daily as needed for Muscle Spasm(s) for up to 60 days.  Dispense: 60 Tab; Refill: 0    Current Outpatient Medications   Medication Sig Dispense Refill   ??? ARIPiprazole (ABILIFY) 5 mg tablet 5 mg daily.     ??? aspirin delayed-release 81 mg tablet 81 mg.     ??? clonazePAM (KlonoPIN) 1 mg tablet 1 mg three (3) times daily.      ??? lamoTRIgine (LaMICtal) 25 mg tablet 50 mg. oNCE A DAY     ??? lidocaine (LIDODERM) 5 % Apply 1 patch as directed for 12 hours every 24 hours (12 hours on, 12 hours off)     ??? cloNIDine HCL (CATAPRES) 0.1 mg tablet Take 1 Tab by mouth two (2) times a day for 90 days. 180 Tab 0   ??? dilTIAZem ER (CARDIZEM CD) 300 mg capsule Take 1 Cap by mouth daily for 90 days. oNCE A DAY 90 Cap 1   ??? Bydureon 2 mg/0.65 mL pnij 0.65 mL by abdominal subcutaneous route every seven (7) days for 30 doses. ONCE A WEEK 4 Adjustable Dose Pre-filled Pen Syringe 5   ??? labetaloL (NORMODYNE) 200 mg tablet Take 1 Tab by mouth two (2) times  a day for 90 days. TWICE A DAY 180 Tab 1   ??? levothyroxine (SYNTHROID) 175 mcg tablet Take 1 Tab by mouth Daily (before breakfast) for 90 days. ONCE A DAY 30 MINUTES BEFORE MEALS 90 Tab 1   ??? atorvastatin (Lipitor) 20 mg tablet Take 1 Tab by mouth daily for 90 days. 90 Tab 1   ??? metFORMIN (GLUCOPHAGE) 1,000 mg tablet Take 1 Tab by mouth two (2) times daily (with meals) for 90 days. 180 Tab 0   ??? cyclobenzaprine (FLEXERIL) 10 mg tablet Take 1 Tab by mouth two (2) times daily as needed for Muscle Spasm(s) for up to 60 days. 60 Tab 0   ??? ondansetron hcl (ZOFRAN) 4 mg tablet Take 1 Tab by mouth every eight (8) hours as needed for Nausea. 12 Tab 0   ??? Hydrochlorothiazide 12.5 mg tablet Take 12.5 mg by mouth daily. 30 Tab 5   ??? zolpidem (AMBIEN) 10 mg tablet Take  by mouth nightly as needed for Sleep.     ??? omeprazole (PRILOSEC) 20 mg capsule Take 1 Cap by mouth two (2) times a day. 60 Cap 6   ??? dicyclomine (BENTYL) 10 mg capsule Take 1 Cap by mouth three (3) times daily. PRN 30 Cap 0   ??? glucose blood VI test strips (ASCENSIA AUTODISC VI, ONE TOUCH ULTRA TEST VI) strip by Does Not Apply route See Admin Instructions.      ??? HYDROcodone-acetaminophen (NORCO) 5-325 mg per tablet Take 1-2 tablets PO every 4-6 hours as needed for pain control.  If over the counter ibuprofen or acetaminophen was suggested, then only take the vicodin for pain not well controlled with the over the counter medication. 12 Tab 0   ??? gabapentin (NEURONTIN) 600 mg tablet Take 1 Tab by mouth three (3) times daily. (Patient taking differently: Take 300 mg by mouth three (3) times daily. 4 X A DAY) 90 Tab 4   ??? irbesartan (AVAPRO) 150 mg tablet Take 1 Tab by mouth nightly. 30 Tab 5       Patient Active Problem List    Diagnosis Date Noted   ??? Obesity, morbid ([place]) 10/22/2019   ??? Spinal stenosis    ??? IBS (irritable bowel syndrome)    ??? Chest pain 12/10/2012   ??? Dizziness 12/10/2012   ??? CAD (coronary artery disease) 06/06/2011   ??? Hyperlipidemia with target low density lipoprotein (LDL) cholesterol less than 70 mg/dL 06/06/2011   ??? Hypertension 06/06/2011   ??? Obesity 06/06/2011   ??? DM (diabetes mellitus) ([place]) 06/06/2011   ??? GERD (gastroesophageal reflux disease) 06/06/2011     Results for orders placed or performed during the hospital encounter of 02/19/13   CBC WITH AUTOMATED DIFF   Result Value Ref Range    WBC 11.0 4.6 - 13.2 K/uL    RBC 4.64 4.20 - 5.30 M/uL    HGB 13.2 12.0 - 16.0 g/dL    HCT 39.7 35.0 - 45.0 %    MCV 85.6 74.0 - 97.0 FL    MCH 28.4 24.0 - 34.0 PG    MCHC 33.2 31.0 - 37.0 g/dL    RDW 12.9 11.6 - 14.5 %    PLATELET 261 135 - 420 K/uL    MPV 12.1 (H) 9.2 - 11.8 FL    NEUTROPHILS 84 (H) 40 - 73 %    LYMPHOCYTES 12 (L) 21 - 52 %    MONOCYTES 3 3 - 10 %    EOSINOPHILS 1 0 -  5 %    BASOPHILS 0 0 - 2 %    ABS. NEUTROPHILS 9.2 (H) 1.8 - 8.0 K/UL    ABS. LYMPHOCYTES 1.3 0.9 - 3.6 K/UL    ABS. MONOCYTES 0.3 0.05 - 1.2 K/UL    ABS. EOSINOPHILS 0.2 0.0 - 0.4 K/UL    ABS. BASOPHILS 0.0 0.0 - 0.1 K/UL    DF AUTOMATED    METABOLIC PANEL, COMPREHENSIVE   Result Value Ref Range    Sodium 136 136 - 145 mmol/L    Potassium 4.0 3.5 - 5.5 mmol/L     Chloride 105 100 - 108 mmol/L    CO2 24 21 - 32 mmol/L    Anion gap 7 3.0 - 18 mmol/L    Glucose 157 (H) 74 - 99 mg/dL    BUN 15 7.0 - 18 MG/DL    Creatinine 0.81 0.6 - 1.3 MG/DL    BUN/Creatinine ratio 19 12 - 20      GFR est AA >60 >60 ml/min/1.67m    GFR est non-AA >60 >60 ml/min/1.778m   Calcium 8.9 8.5 - 10.1 MG/DL    Bilirubin, total 0.4 0.2 - 1.0 MG/DL    ALT (SGPT) 29 12.0 - 78.0 U/L    AST (SGOT) 12 (L) 15 - 37 U/L    Alk. phosphatase 76 45 - 117 U/L    Protein, total 8.3 (H) 6.4 - 8.2 g/dL    Albumin 3.9 3.4 - 5.0 g/dL    Globulin 4.4 (H) 2.0 - 4.0 g/dL    A-G Ratio 0.9 0.8 - 1.7     LIPASE   Result Value Ref Range    Lipase 124 73 - 393 U/L   MAGNESIUM   Result Value Ref Range    Magnesium 1.7 (L) 1.8 - 2.4 mg/dL   CARDIAC PANEL,(CK, CKMB & TROPONIN)   Result Value Ref Range    CK 80 26 - 192 U/L    CK - MB <0.5 (L) 0.5 - 3.6 ng/ml    CK-MB Index CANNOT BE CALCULATED 0.0 - 4.0 %    Troponin-I, QT <0.02 0.0 - 0.045 NG/ML   URINALYSIS W/ RFLX MICROSCOPIC   Result Value Ref Range    Color YELLOW     Appearance CLEAR     Specific gravity >1.030 (H) 1.003 - 1.030      pH (UA) 6.0 5.0 - 8.0      Protein TRACE (A) NEGATIVE mg/dL    Glucose NEGATIVE  NEGATIVE mg/dL    Ketone NEGATIVE  NEGATIVE mg/dL    Bilirubin NEGATIVE  NEGATIVE    Blood NEGATIVE  NEGATIVE    Urobilinogen 0.2 0.2 - 1.0 EU/dL    Nitrites NEGATIVE  NEGATIVE    Leukocyte Esterase NEGATIVE  NEGATIVE   URINE MICROSCOPIC ONLY   Result Value Ref Range    WBC 0 to 2 0 - 4 /hpf    RBC 0 to 1 0 - 5 /hpf    Epithelial cells FEW 0 - 5 /lpf    Bacteria FEW (A) NEGATIVE /hpf    Mucus 1+ (A) NEGATIVE /lpf   EKG, 12 LEAD, INITIAL   Result Value Ref Range    Ventricular Rate 108 BPM    Atrial Rate 108 BPM    P-R Interval 148 ms    QRS Duration 80 ms    Q-T Interval 344 ms    QTC Calculation (Bezet) 460 ms    Calculated P Axis 46 degrees  Calculated R Axis 35 degrees    Calculated T Axis 33 degrees    Diagnosis       Sinus tachycardia  Otherwise normal ECG   When compared with ECG of 10-Dec-2012 14:12,  No significant change was found  Confirmed by [id number], Fayne Norrie MD ([id number]) on 02/19/2013 5:12:22 PM     No visits with results within 3 Month(s) from this visit.   Latest known visit with results is:   Admission on 02/19/2013, Discharged on 02/19/2013   Component Date Value Ref Range Status   ??? WBC 02/19/2013 11.0  4.6 - 13.2 K/uL Final   ??? RBC 02/19/2013 4.64  4.20 - 5.30 M/uL Final   ??? HGB 02/19/2013 13.2  12.0 - 16.0 g/dL Final   ??? HCT 02/19/2013 39.7  35.0 - 45.0 % Final   ??? MCV 02/19/2013 85.6  74.0 - 97.0 FL Final   ??? MCH 02/19/2013 28.4  24.0 - 34.0 PG Final   ??? MCHC 02/19/2013 33.2  31.0 - 37.0 g/dL Final   ??? RDW 02/19/2013 12.9  11.6 - 14.5 % Final   ??? PLATELET 02/19/2013 261  135 - 420 K/uL Final   ??? MPV 02/19/2013 12.1* 9.2 - 11.8 FL Final   ??? NEUTROPHILS 02/19/2013 84* 40 - 73 % Final   ??? LYMPHOCYTES 02/19/2013 12* 21 - 52 % Final   ??? MONOCYTES 02/19/2013 3  3 - 10 % Final   ??? EOSINOPHILS 02/19/2013 1  0 - 5 % Final   ??? BASOPHILS 02/19/2013 0  0 - 2 % Final   ??? ABS. NEUTROPHILS 02/19/2013 9.2* 1.8 - 8.0 K/UL Final   ??? ABS. LYMPHOCYTES 02/19/2013 1.3  0.9 - 3.6 K/UL Final   ??? ABS. MONOCYTES 02/19/2013 0.3  0.05 - 1.2 K/UL Final   ??? ABS. EOSINOPHILS 02/19/2013 0.2  0.0 - 0.4 K/UL Final   ??? ABS. BASOPHILS 02/19/2013 0.0  0.0 - 0.1 K/UL Final   ??? DF 02/19/2013 AUTOMATED   Final   ??? Sodium 02/19/2013 136  136 - 145 mmol/L Final   ??? Potassium 02/19/2013 4.0  3.5 - 5.5 mmol/L Final   ??? Chloride 02/19/2013 105  100 - 108 mmol/L Final   ??? CO2 02/19/2013 24  21 - 32 mmol/L Final   ??? Anion gap 02/19/2013 7  3.0 - 18 mmol/L Final   ??? Glucose 02/19/2013 157* 74 - 99 mg/dL Final   ??? BUN 02/19/2013 15  7.0 - 18 MG/DL Final   ??? Creatinine 02/19/2013 0.81  0.6 - 1.3 MG/DL Final   ??? BUN/Creatinine ratio 02/19/2013 19  12 - 20   Final   ??? GFR est AA 02/19/2013 >60  >60 ml/min/1.34m Final   ??? GFR est non-AA 02/19/2013 >60  >60 ml/min/1.762mFinal    Comment: (NOTE)                            Estimated GFR is calculated using the Modification of Diet in Renal                            Disease (MDRD) Study equation, reported for both African Americans                            (GFRAA) and non-African Americans (GFRNA), and normalized to 1.7342m  body surface area. The physician must decide which value applies to                            the patient. The MDRD study equation should only be used in                            individuals age 56 or older. It has not been validated for the                            following: pregnant women, patients with serious comorbid conditions,                            or on certain medications, or persons with extremes of body size,                            muscle mass, or nutritional status.   ??? Calcium 02/19/2013 8.9  8.5 - 10.1 MG/DL Final   ??? Bilirubin, total 02/19/2013 0.4  0.2 - 1.0 MG/DL Final   ??? ALT (SGPT) 02/19/2013 29  12.0 - 78.0 U/L Final   ??? AST (SGOT) 02/19/2013 12* 15 - 37 U/L Final   ??? Alk. phosphatase 02/19/2013 76  45 - 117 U/L Final    ** Note new reference range and method **   ??? Protein, total 02/19/2013 8.3* 6.4 - 8.2 g/dL Final   ??? Albumin 02/19/2013 3.9  3.4 - 5.0 g/dL Final   ??? Globulin 02/19/2013 4.4* 2.0 - 4.0 g/dL Final   ??? A-G Ratio 02/19/2013 0.9  0.8 - 1.7   Final   ??? Lipase 02/19/2013 124  73 - 393 U/L Final   ??? Magnesium 02/19/2013 1.7* 1.8 - 2.4 mg/dL Final   ??? CK 02/19/2013 80  26 - 192 U/L Final   ??? CK - MB 02/19/2013 <0.5* 0.5 - 3.6 ng/ml Final   ??? CK-MB Index 02/19/2013 CANNOT BE CALCULATED  0.0 - 4.0 % Final   ??? Troponin-I, QT 02/19/2013 <0.02  0.0 - 0.045 NG/ML Final    Comment: (NOTE)                           The presence of detectable troponin above the reference range                            indicates myocardial injury which may be due to ischemia,                            myocarditis, trauma, etc. Clinical correlation is necessary to                             determine the significance of this finding. Sequential testing is                            recommended to determine if the typical rise and fall of cTnI is  demonstrated.                           Note: cardiac troponin-I has a relatively long half-life                           and may be present well after the CK MB has returned to baseline.                            0.09 to 0.59 ng/mL:indeterminant for myocardial infarction.                           0.60 to 1.50 ng/mL:consistent with WHO criteria for myocardial                                              infarction.   ??? Color 02/19/2013 YELLOW   Final   ??? Appearance 02/19/2013 CLEAR   Final   ??? Specific gravity 02/19/2013 >1.030* 1.003 - 1.030   Final   ??? pH (UA) 02/19/2013 6.0  5.0 - 8.0   Final   ??? Protein 02/19/2013 TRACE* NEGATIVE mg/dL Final   ??? Glucose 02/19/2013 NEGATIVE   NEGATIVE mg/dL Final   ??? Ketone 02/19/2013 NEGATIVE   NEGATIVE mg/dL Final   ??? Bilirubin 02/19/2013 NEGATIVE   NEGATIVE Final   ??? Blood 02/19/2013 NEGATIVE   NEGATIVE Final   ??? Urobilinogen 02/19/2013 0.2  0.2 - 1.0 EU/dL Final   ??? Nitrites 02/19/2013 NEGATIVE   NEGATIVE Final   ??? Leukocyte Esterase 02/19/2013 NEGATIVE   NEGATIVE Final   ??? Ventricular Rate 02/19/2013 108  BPM Final   ??? Atrial Rate 02/19/2013 108  BPM Final   ??? P-R Interval 02/19/2013 148  ms Final   ??? QRS Duration 02/19/2013 80  ms Final   ??? Q-T Interval 02/19/2013 344  ms Final   ??? QTC Calculation (Bezet) 02/19/2013 460  ms Final   ??? Calculated P Axis 02/19/2013 46  degrees Final   ??? Calculated R Axis 02/19/2013 35  degrees Final   ??? Calculated T Axis 02/19/2013 33  degrees Final   ??? Diagnosis 02/19/2013    Final                    Value:Sinus tachycardia                          Otherwise normal ECG                          When compared with ECG of 10-Dec-2012 14:12,                          No significant change was found                           Confirmed by [id number], Fayne Norrie MD ([id number]) on 02/19/2013 5:12:22 PM   ??? WBC 02/19/2013 0 to 2  0 - 4 /hpf Final   ??? RBC 02/19/2013 0 to 1  0 - 5 /hpf Final   ???  Epithelial cells 02/19/2013 FEW  0 - 5 /lpf Final   ??? Bacteria 02/19/2013 FEW* NEGATIVE /hpf Final   ??? Mucus 02/19/2013 1+* NEGATIVE /lpf Final          Follow-up and Dispositions    ?? Return in about 2 weeks (around 11/05/2019).

## 2019-10-22 NOTE — Progress Notes (Signed)
Christy [name] is a 43 y.o. female (DOB: 09/28/1976) presenting to address:    Chief Complaint   Patient presents with   ??? New Patient       Vitals:    10/22/19 1313   Resp: 16   Temp: 98.4 ??F (36.9 ??C)   TempSrc: Temporal   Weight: 235 lb (106.6 kg)   Height: 5' 1" (1.549 m)       Hearing/Vision:   No exam data present    Learning Assessment:   No flowsheet data found.  Depression Screening:     3 most recent PHQ Screens 10/22/2019   Little interest or pleasure in doing things Nearly every day   Feeling down, depressed, irritable, or hopeless Nearly every day   Total Score PHQ 2 6     Fall Risk Assessment:     Fall Risk Assessment, last 12 mths 10/22/2019   Able to walk? Yes   Fall in past 12 months? 0   Do you feel unsteady? 1   Are you worried about falling 1     Abuse Screening:     Abuse Screening Questionnaire 10/22/2019   Do you ever feel afraid of your partner? N   Are you in a relationship with someone who physically or mentally threatens you? N   Is it safe for you to go home? Y     Coordination of Care Questionaire:   1. Have you been to the ER, urgent care clinic since your last visit?  Hospitalized since your last visit?yES    2. Have you seen or consulted any other health care providers outside of the [facility] since your last visit?  Include any pap smears or colon screening. NO    Advanced Directive:   1. Do you have an Advanced Directive? YES    2. Would you like information on Advanced Directives? NO

## 2019-10-25 LAB — CBC WITH AUTOMATED DIFF
ABS. BASOPHILS: 70 cells/uL (ref 0–200)
ABS. EOSINOPHILS: 187 cells/uL (ref 15–500)
ABS. LYMPHOCYTES: 3475 cells/uL (ref 850–3900)
ABS. MONOCYTES: 386 cells/uL (ref 200–950)
ABS. NEUTROPHILS: 7582 cells/uL (ref 1500–7800)
BASOPHILS: 0.6 %
EOSINOPHILS: 1.6 %
HCT: 38.4 % (ref 35.0–45.0)
HGB: 12.4 g/dL (ref 11.7–15.5)
LYMPHOCYTES: 29.7 %
MCH: 28.5 pg (ref 27.0–33.0)
MCHC: 32.3 g/dL (ref 32.0–36.0)
MCV: 88.3 fL (ref 80.0–100.0)
MEAN PLATELET VOLUME: 14.7 fL — ABNORMAL HIGH (ref 7.5–12.5)
MONOCYTES: 3.3 %
Neutrophils: 64.8 %
PLATELET: 249 10*3/uL (ref 140–400)
RBC: 4.35 10*6/uL (ref 3.80–5.10)
RDW: 12.4 % (ref 11.0–15.0)
WBC: 11.7 10*3/uL — ABNORMAL HIGH (ref 3.8–10.8)

## 2019-10-25 LAB — TSH AND FREE T4
T4, Free: 1.2 ng/dL (ref 0.8–1.8)
TSH: 5.85 mIU/L — ABNORMAL HIGH

## 2019-10-25 LAB — MICROALBUMIN, UR, RAND W/ MICROALB/CREAT RATIO
Creatinine Urine: 61 mg/dL (ref 20–275)
Microalbumin, urine: 0.2 mg/dL
Microalbumin/Creat Ratio: 3 mcg/mg creat (ref <30)

## 2019-10-25 LAB — METABOLIC PANEL, COMPREHENSIVE
ALB/GLOBRATIO: 1.5 (calc) (ref 1.0–2.5)
ALT (SGPT): 13 U/L (ref 6–29)
AST (SGOT): 12 U/L (ref 10–30)
Albumin: 4.5 g/dL (ref 3.6–5.1)
Alkaline Phosphatase, total: 84 U/L (ref 31–125)
BUN: 9 mg/dL (ref 7–25)
Bilirubin, total: 0.2 mg/dL (ref 0.2–1.2)
CO2: 25 mmol/L (ref 20–32)
Calcium: 9.9 mg/dL (ref 8.6–10.2)
Chloride: 102 mmol/L (ref 98–110)
Creatinine: 0.76 mg/dL (ref 0.50–1.10)
GFR est AA: 111 mL/min/{1.73_m2} (ref >=60)
GFR est non-AA: 96 mL/min/{1.73_m2} (ref >=60)
Globulin: 3.1 g/dL (calc) (ref 1.9–3.7)
Glucose: 203 mg/dL — ABNORMAL HIGH (ref 65–99)
Potassium: 4.4 mmol/L (ref 3.5–5.3)
Protein, total: 7.6 g/dL (ref 6.1–8.1)
Sodium: 137 mmol/L (ref 135–146)

## 2019-10-25 LAB — LIPID PANEL
Chol/HDL Ratio: 2.9 (calc) (ref <5.0)
Cholesterol, Total: 168 mg/dL (ref <200)
Cholesterol, total: 168 mg/dL (ref <200)
Cholesterol/HDL ratio: 2.9 (calc) (ref <5.0)
HDL Cholesterol: 58 mg/dL (ref >=50)
HDL: 58 mg/dL (ref >=50)
LDL Cholesterol: 89 mg/dL (calc)
LDL-CHOLESTEROL: 89 mg/dL (calc)
Non-HDL Cholesterol: 110 mg/dL (calc) (ref <130)
Non-HDL Cholesterol: 110 mg/dL (calc) (ref <130)
Triglyceride: 115 mg/dL (ref <150)
Triglycerides: 115 mg/dL (ref <150)

## 2019-10-25 LAB — HEMOGLOBIN A1C W/O EAG
Hemoglobin A1C: 8.2 % of total Hgb — ABNORMAL HIGH (ref <5.7)
Hemoglobin A1c: 8.2 % of total Hgb — ABNORMAL HIGH (ref <5.7)

## 2019-10-25 LAB — CBC WITH AUTO DIFFERENTIAL
Basophils %: 0.6 %
Basophils Absolute: 70 cells/uL (ref 0–200)
Eosinophils %: 1.6 %
Eosinophils Absolute: 187 cells/uL (ref 15–500)
Hematocrit: 38.4 % (ref 35.0–45.0)
Hemoglobin: 12.4 g/dL (ref 11.7–15.5)
Lymphocytes %: 29.7 %
Lymphocytes Absolute: 3475 cells/uL (ref 850–3900)
MCH: 28.5 pg (ref 27.0–33.0)
MCHC: 32.3 g/dL (ref 32.0–36.0)
MCV: 88.3 fL (ref 80.0–100.0)
MPV: 14.7 fL — ABNORMAL HIGH (ref 7.5–12.5)
Monocytes %: 3.3 %
Monocytes Absolute: 386 cells/uL (ref 200–950)
Neutrophils %: 64.8 %
Neutrophils Absolute: 7582 cells/uL (ref 1500–7800)
Platelets: 249 10*3/uL (ref 140–400)
RBC: 4.35 10*6/uL (ref 3.80–5.10)
RDW: 12.4 % (ref 11.0–15.0)
WBC: 11.7 10*3/uL — ABNORMAL HIGH (ref 3.8–10.8)

## 2019-10-25 LAB — COMPREHENSIVE METABOLIC PANEL
ALT: 13 U/L (ref 6–29)
AST: 12 U/L (ref 10–30)
Albumin/Globulin Ratio: 1.5 (calc) (ref 1.0–2.5)
Albumin: 4.5 g/dL (ref 3.6–5.1)
Alkaline Phosphatase: 84 U/L (ref 31–125)
BUN: 9 mg/dL (ref 7–25)
CO2: 25 mmol/L (ref 20–32)
Calcium: 9.9 mg/dL (ref 8.6–10.2)
Chloride: 102 mmol/L (ref 98–110)
Creatinine: 0.76 mg/dL (ref 0.50–1.10)
EGFR IF NonAfrican American: 96 mL/min/{1.73_m2} (ref >=60)
GFR African American: 111 mL/min/{1.73_m2} (ref >=60)
Globulin: 3.1 g/dL (calc) (ref 1.9–3.7)
Glucose: 203 mg/dL — ABNORMAL HIGH (ref 65–99)
Potassium: 4.4 mmol/L (ref 3.5–5.3)
Sodium: 137 mmol/L (ref 135–146)
Total Bilirubin: 0.2 mg/dL (ref 0.2–1.2)
Total Protein: 7.6 g/dL (ref 6.1–8.1)

## 2019-10-25 LAB — TSH + FREE T4 PANEL
T4 Free: 1.2 ng/dL (ref 0.8–1.8)
TSH: 5.85 mIU/L — ABNORMAL HIGH

## 2019-10-25 LAB — MICROALBUMIN / CREATININE URINE RATIO
CREATININE URINE,9612018: 61 mg/dL (ref 20–275)
MICROALBUMIN/CREAT RATIO: 3 mcg/mg creat (ref <30)
Microalb, Ur: 0.2 mg/dL

## 2019-11-06 ENCOUNTER — Encounter

## 2019-11-06 ENCOUNTER — Ambulatory Visit: Attending: Family | Primary: Family Medicine

## 2019-11-06 ENCOUNTER — Ambulatory Visit: Admit: 2019-11-06 | Discharge: 2019-11-06 | Payer: PRIVATE HEALTH INSURANCE | Primary: Family

## 2019-11-06 ENCOUNTER — Ambulatory Visit: Admit: 2019-11-06 | Discharge: 2019-11-06 | Payer: PRIVATE HEALTH INSURANCE | Attending: Family | Primary: Family

## 2019-11-06 ENCOUNTER — Ambulatory Visit: Payer: PRIVATE HEALTH INSURANCE | Attending: Legal Medicine | Primary: Family

## 2019-11-06 DIAGNOSIS — N390 Urinary tract infection, site not specified: Secondary | ICD-10-CM

## 2019-11-06 DIAGNOSIS — R1030 Lower abdominal pain, unspecified: Secondary | ICD-10-CM

## 2019-11-06 LAB — AMB POC URINALYSIS DIP STICK AUTO W/O MICRO
Bilirubin (UA POC): NEGATIVE
Bilirubin, Urine, POC: NEGATIVE
Blood (UA POC): NEGATIVE
Blood (UA POC): NEGATIVE
Ketones (UA POC): NEGATIVE
Ketones, Urine, POC: NEGATIVE
Leukocyte Esterase, Urine, POC: NEGATIVE
Leukocyte esterase (UA POC): NEGATIVE
Nitrite, Urine, POC: NEGATIVE
Nitrites (UA POC): NEGATIVE
Protein (UA POC): NEGATIVE
Protein, Urine, POC: NEGATIVE
Specific Gravity, Urine, POC: 1.03 NA (ref 1.001–1.035)
Specific gravity (UA POC): 1.03 (ref 1.001–1.035)
Urobilinogen (UA POC): 0.2 (ref 0.2–1)
Urobilinogen, POC: 0.2 (ref 0.2–1)
pH (UA POC): 6 (ref 4.6–8.0)
pH, Urine, POC: 6 NA (ref 4.6–8.0)

## 2019-11-06 MED ORDER — PHENAZOPYRIDINE 200 MG TAB
200 mg | ORAL_TABLET | Freq: Three times a day (TID) | ORAL | 0 refills | Status: AC
Start: 2019-11-06 — End: 2019-11-08

## 2019-11-06 MED ORDER — CIPROFLOXACIN 500 MG TAB
500 mg | ORAL_TABLET | Freq: Every day | ORAL | 0 refills | Status: AC
Start: 2019-11-06 — End: 2019-11-13

## 2019-11-06 MED ORDER — FLUCONAZOLE 150 MG TAB
150 mg | ORAL_TABLET | ORAL | 0 refills | Status: DC
Start: 2019-11-06 — End: 2020-01-14

## 2019-11-06 NOTE — Progress Notes (Signed)
 Christy [name] is a 44 y.o. female (DOB: 04-Apr-1976) presenting to address:    Chief Complaint   Patient presents with   . Abdominal Pain   . Urinary Burning       Vitals:    11/06/19 1314   BP: (!) 160/114   Pulse: 98   Resp: 14   Temp: 97.9 F (36.6 C)   TempSrc: Temporal   SpO2: 95%   Weight: 234 lb 9.6 oz (106.4 kg)   Height: 5' 1 (1.549 m)       Is someone accompanying this pt?  NO    Is the patient using any DME equipment during OV?  NO    Hearing/Vision:   No exam data present    Learning Assessment:   No flowsheet data found.  Depression Screening:     3 most recent PHQ Screens 11/06/2019   Little interest or pleasure in doing things Not at all   Feeling down, depressed, irritable, or hopeless Not at all   Total Score PHQ 2 0     Fall Risk Assessment:     Fall Risk Assessment, last 12 mths 10/22/2019   Able to walk? Yes   Fall in past 12 months? 0   Do you feel unsteady? 1   Are you worried about falling 1     Coordination of Care Questionaire:   1. Have you been to the ER, urgent care clinic since your last visit?  Hospitalized since your last visit? NO    2. Have you seen or consulted any other health care providers outside of the [facility] since your last visit?  Include any pap smears or colon screening. NO    Advanced Directive:   1. Do you have an Advanced Directive? NO    2. Would you like information on Advanced Directives? NO

## 2019-11-06 NOTE — Progress Notes (Signed)
HPI  Christy [name] is a 44 y.o. year old female who presents today with abdominal pain, thick but clear vaginal discharge, burning with urination for the past 2 weeks. Also states that her urine has been very cloudy and has a foul smell to it. Recently found out that her spouse was being unfaithful and is worried that he might have given her something, so she would also like to be tested for STIs.         Past Medical History:   Diagnosis Date   ??? Acid indigestion    ??? Acid reflux    ??? Asthma    ??? CAD (coronary artery disease)    ??? Depression    ??? Diabetes ([place])    ??? Diabetic neuropathy ([place])    ??? 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 Enlarged heart    ??? Hypertension    ??? IBS (irritable bowel syndrome)    ??? Interstitial cystitis    ??? Sciatica    ??? Spinal stenosis        Past Surgical History:   Procedure Laterality Date   ??? HX CHOLECYSTECTOMY      2008   ??? HX ORTHOPAEDIC      LEFT KNEE   ??? HX ORTHOPAEDIC      LEFT ARM   ??? HX PARTIAL HYSTERECTOMY  2003    STILL HAVE OVARIES   ??? HX PELVIC LAPAROSCOPY     ??? HX THYROIDECTOMY         Social History     Tobacco Use   ??? Smoking status: Current Every Day Smoker     Packs/day: 0.25   ??? Smokeless tobacco: Never Used   Substance Use Topics   ??? Alcohol use: Yes     Frequency: 2-4 times a month   ??? Drug use: Yes     Types: Marijuana     Comment: ONCE EVERY 6 MONTHS         Current Outpatient Medications:   ???  aspirin delayed-release 81 mg tablet, 81 mg., Disp: , Rfl:   ???  clonazePAM (KlonoPIN) 1 mg tablet, 1 mg three (3) times daily., Disp: , Rfl:   ???  lamoTRIgine (LaMICtal) 25 mg tablet, 50 mg. oNCE A DAY, Disp: , Rfl:   ???  lidocaine (LIDODERM) 5 %, Apply 1 patch as directed for 12 hours every 24 hours (12 hours on, 12 hours off), Disp: , Rfl:   ???  cloNIDine HCL (CATAPRES) 0.1 mg tablet, Take 1 Tab by mouth two (2) times a day for 90 days., Disp: 180 Tab, Rfl: 0  ???  dilTIAZem ER (CARDIZEM CD) 300 mg capsule, Take 1 Cap by mouth daily for 90 days. oNCE A DAY, Disp: 90  Cap, Rfl: 1  ???  labetaloL (NORMODYNE) 200 mg tablet, Take 1 Tab by mouth two (2) times a day for 90 days. TWICE A DAY, Disp: 180 Tab, Rfl: 1  ???  levothyroxine (SYNTHROID) 175 mcg tablet, Take 1 Tab by mouth Daily (before breakfast) for 90 days. ONCE A DAY 30 MINUTES BEFORE MEALS, Disp: 90 Tab, Rfl: 1  ???  atorvastatin (Lipitor) 20 mg tablet, Take 1 Tab by mouth daily for 90 days., Disp: 90 Tab, Rfl: 1  ???  metFORMIN (GLUCOPHAGE) 1,000 mg tablet, Take 1 Tab by mouth two (2) times daily (with meals) for 90 days., Disp: 180 Tab, Rfl: 0  ???  cyclobenzaprine (FLEXERIL) 10 mg tablet, Take  1 Tab by mouth two (2) times daily as needed for Muscle Spasm(s) for up to 60 days., Disp: 60 Tab, Rfl: 0  ???  glucose blood VI test strips (ASCENSIA AUTODISC VI, ONE TOUCH ULTRA TEST VI) strip, by Does Not Apply route See Admin Instructions., Disp: , Rfl:   ???  lancets misc, Check blood sugar 3 times daily, Disp: 300 Each, Rfl: 5  ???  glucose blood VI test strips (OneTouch Verio test strips) strip, Her machine is Verio flex  3/day, Disp: 300 Strip, Rfl: 5  ???  glucose blood VI test strips (OneTouch Verio test strips) strip, Check blood sugar 3 times daily, Disp: 300 Strip, Rfl: 5  ???  gabapentin (NEURONTIN) 600 mg tablet, Take 1 Tab by mouth three (3) times daily. (Patient taking differently: Take 300 mg by mouth three (3) times daily. 4 X A DAY), Disp: 90 Tab, Rfl: 4  ???  Hydrochlorothiazide 12.5 mg tablet, Take 12.5 mg by mouth daily., Disp: 30 Tab, Rfl: 5  ???  irbesartan (AVAPRO) 150 mg tablet, Take 1 Tab by mouth nightly., Disp: 30 Tab, Rfl: 5  ???  ARIPiprazole (ABILIFY) 5 mg tablet, 5 mg daily., Disp: , Rfl:   ???  Bydureon 2 mg/0.65 mL pnij, 0.65 mL by abdominal subcutaneous route every seven (7) days for 30 doses. ONCE A WEEK, Disp: 4 Adjustable Dose Pre-filled Pen Syringe, Rfl: 5  ???  ondansetron hcl (ZOFRAN) 4 mg tablet, Take 1 Tab by mouth every eight (8) hours as needed for Nausea., Disp: 12 Tab, Rfl: 0  ???  HYDROcodone-acetaminophen  (NORCO) 5-325 mg per tablet, Take 1-2 tablets PO every 4-6 hours as needed for pain control.  If over the counter ibuprofen or acetaminophen was suggested, then only take the vicodin for pain not well controlled with the over the counter medication., Disp: 12 Tab, Rfl: 0  ???  zolpidem (AMBIEN) 10 mg tablet, Take  by mouth nightly as needed for Sleep., Disp: , Rfl:   ???  omeprazole (PRILOSEC) 20 mg capsule, Take 1 Cap by mouth two (2) times a day., Disp: 60 Cap, Rfl: 6  ???  dicyclomine (BENTYL) 10 mg capsule, Take 1 Cap by mouth three (3) times daily. PRN, Disp: 30 Cap, Rfl: 0     Allergies   Allergen Reactions   ??? Vicodin [Hydrocodone-Acetaminophen] Itching   ??? Pcn [Penicillins] Other (comments)     Review of Systems   Constitutional: Negative for chills and fever.   Respiratory: Negative for cough and shortness of breath.    Cardiovascular: Negative for chest pain and palpitations.   Gastrointestinal: Positive for abdominal pain. Negative for blood in stool, constipation, diarrhea, nausea and vomiting.        Suprapubic, lower abdominal region   Genitourinary: Positive for dysuria, frequency and urgency. Negative for flank pain and hematuria.         PE  BP (!) 160/114 (BP 1 Location: Left arm, BP Patient Position: Sitting)    Pulse 98    Temp 97.9 ??F (36.6 ??C) (Temporal)    Resp 14    Ht 5\' 1"  (1.549 m)    Wt 234 lb 9.6 oz (106.4 kg)    SpO2 95%    BMI 44.33 kg/m??     Physical Exam  Vitals signs reviewed.   Constitutional:       General: She is not in acute distress.     Appearance: Normal appearance.   HENT:      Head: Normocephalic  and atraumatic.   Cardiovascular:      Rate and Rhythm: Normal rate and regular rhythm.      Heart sounds: S1 normal and S2 normal. No murmur. No friction rub. No gallop. No S3 or S4 sounds.    Pulmonary:      Effort: Pulmonary effort is normal.      Breath sounds: Normal breath sounds. No wheezing, rhonchi or rales.   Abdominal:      General: Bowel sounds are normal. There is no  distension.      Palpations: Abdomen is soft. There is no hepatomegaly or splenomegaly.      Tenderness: There is abdominal tenderness in the suprapubic area. There is no right CVA tenderness, left CVA tenderness, guarding or rebound.   Musculoskeletal:      Right lower leg: No edema.      Left lower leg: No edema.   Neurological:      Mental Status: She is alert.       Assessment/Plan  1. UTI  Cipro 500mg  PO every day X 7 days  Pyridium 200mg  PO TID X 2 days  PUSh PO fluids  Cranberry juice or tabs daily    2. STI Screening  Permission obtained to test for gonorrhea, chlamydia, syphilis, HIV and HSV

## 2019-11-06 NOTE — Progress Notes (Signed)
HPI  Christy [name] is a 44 y.o. year old female who presents today with abdominal pain, thick but clear vaginal discharge, burning with urination for the past 2 weeks. Also states that her urine has been very cloudy and has a foul smell to it. Recently found out that her spouse was being unfaithful and is worried that he might have given her something, so she would also like to be tested for STIs.         Past Medical History:   Diagnosis Date   ??? Acid indigestion    ??? Acid reflux    ??? Asthma    ??? CAD (coronary artery disease)    ??? Depression    ??? Diabetes ([facility])    ??? Diabetic neuropathy ([facility])    ??? 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 Enlarged heart    ??? Hypertension    ??? IBS (irritable bowel syndrome)    ??? Interstitial cystitis    ??? Sciatica    ??? Spinal stenosis        Past Surgical History:   Procedure Laterality Date   ??? HX CHOLECYSTECTOMY      2008   ??? HX ORTHOPAEDIC      LEFT KNEE   ??? HX ORTHOPAEDIC      LEFT ARM   ??? HX PARTIAL HYSTERECTOMY  2003    STILL HAVE OVARIES   ??? HX PELVIC LAPAROSCOPY     ??? HX THYROIDECTOMY         Social History     Tobacco Use   ??? Smoking status: Current Every Day Smoker     Packs/day: 0.25   ??? Smokeless tobacco: Never Used   Substance Use Topics   ??? Alcohol use: Yes     Frequency: 2-4 times a month   ??? Drug use: Yes     Types: Marijuana     Comment: ONCE EVERY 6 MONTHS         Current Outpatient Medications:   ???  aspirin delayed-release 81 mg tablet, 81 mg., Disp: , Rfl:   ???  clonazePAM (KlonoPIN) 1 mg tablet, 1 mg three (3) times daily., Disp: , Rfl:   ???  lamoTRIgine (LaMICtal) 25 mg tablet, 50 mg. oNCE A DAY, Disp: , Rfl:   ???  lidocaine (LIDODERM) 5 %, Apply 1 patch as directed for 12 hours every 24 hours (12 hours on, 12 hours off), Disp: , Rfl:   ???  cloNIDine HCL (CATAPRES) 0.1 mg tablet, Take 1 Tab by mouth two (2) times a day for 90 days., Disp: 180 Tab, Rfl: 0   ???  dilTIAZem ER (CARDIZEM CD) 300 mg capsule, Take 1 Cap by mouth daily for 90 days. oNCE A DAY, Disp: 90 Cap, Rfl: 1  ???  labetaloL (NORMODYNE) 200 mg tablet, Take 1 Tab by mouth two (2) times a day for 90 days. TWICE A DAY, Disp: 180 Tab, Rfl: 1  ???  levothyroxine (SYNTHROID) 175 mcg tablet, Take 1 Tab by mouth Daily (before breakfast) for 90 days. ONCE A DAY 30 MINUTES BEFORE MEALS, Disp: 90 Tab, Rfl: 1  ???  atorvastatin (Lipitor) 20 mg tablet, Take 1 Tab by mouth daily for 90 days., Disp: 90 Tab, Rfl: 1  ???  metFORMIN (GLUCOPHAGE) 1,000 mg tablet, Take 1 Tab by mouth two (2) times daily (with meals) for 90 days., Disp: 180 Tab, Rfl: 0  ???  cyclobenzaprine (FLEXERIL) 10 mg tablet, Take  1 Tab by mouth two (2) times daily as needed for Muscle Spasm(s) for up to 60 days., Disp: 60 Tab, Rfl: 0  ???  glucose blood VI test strips (ASCENSIA AUTODISC VI, ONE TOUCH ULTRA TEST VI) strip, by Does Not Apply route See Admin Instructions., Disp: , Rfl:   ???  lancets misc, Check blood sugar 3 times daily, Disp: 300 Each, Rfl: 5  ???  glucose blood VI test strips (OneTouch Verio test strips) strip, Her machine is Verio flex  3/day, Disp: 300 Strip, Rfl: 5  ???  glucose blood VI test strips (OneTouch Verio test strips) strip, Check blood sugar 3 times daily, Disp: 300 Strip, Rfl: 5  ???  gabapentin (NEURONTIN) 600 mg tablet, Take 1 Tab by mouth three (3) times daily. (Patient taking differently: Take 300 mg by mouth three (3) times daily. 4 X A DAY), Disp: 90 Tab, Rfl: 4  ???  Hydrochlorothiazide 12.5 mg tablet, Take 12.5 mg by mouth daily., Disp: 30 Tab, Rfl: 5  ???  irbesartan (AVAPRO) 150 mg tablet, Take 1 Tab by mouth nightly., Disp: 30 Tab, Rfl: 5  ???  ARIPiprazole (ABILIFY) 5 mg tablet, 5 mg daily., Disp: , Rfl:   ???  Bydureon 2 mg/0.65 mL pnij, 0.65 mL by abdominal subcutaneous route every seven (7) days for 30 doses. ONCE A WEEK, Disp: 4 Adjustable Dose Pre-filled Pen Syringe, Rfl: 5   ???  ondansetron hcl (ZOFRAN) 4 mg tablet, Take 1 Tab by mouth every eight (8) hours as needed for Nausea., Disp: 12 Tab, Rfl: 0  ???  HYDROcodone-acetaminophen (NORCO) 5-325 mg per tablet, Take 1-2 tablets PO every 4-6 hours as needed for pain control.  If over the counter ibuprofen or acetaminophen was suggested, then only take the vicodin for pain not well controlled with the over the counter medication., Disp: 12 Tab, Rfl: 0  ???  zolpidem (AMBIEN) 10 mg tablet, Take  by mouth nightly as needed for Sleep., Disp: , Rfl:   ???  omeprazole (PRILOSEC) 20 mg capsule, Take 1 Cap by mouth two (2) times a day., Disp: 60 Cap, Rfl: 6  ???  dicyclomine (BENTYL) 10 mg capsule, Take 1 Cap by mouth three (3) times daily. PRN, Disp: 30 Cap, Rfl: 0     Allergies   Allergen Reactions   ??? Vicodin [Hydrocodone-Acetaminophen] Itching   ??? Pcn [Penicillins] Other (comments)     Review of Systems   Constitutional: Negative for chills and fever.   Respiratory: Negative for cough and shortness of breath.    Cardiovascular: Negative for chest pain and palpitations.   Gastrointestinal: Positive for abdominal pain. Negative for blood in stool, constipation, diarrhea, nausea and vomiting.        Suprapubic, lower abdominal region   Genitourinary: Positive for dysuria, frequency and urgency. Negative for flank pain and hematuria.         PE  BP (!) 160/114 (BP 1 Location: Left arm, BP Patient Position: Sitting)    Pulse 98    Temp 97.9 ??F (36.6 ??C) (Temporal)    Resp 14    Ht 5\' 1"  (1.549 m)    Wt 234 lb 9.6 oz (106.4 kg)    SpO2 95%    BMI 44.33 kg/m??     Physical Exam  Vitals signs reviewed.   Constitutional:       General: She is not in acute distress.     Appearance: Normal appearance.   HENT:      Head: Normocephalic  and atraumatic.   Cardiovascular:      Rate and Rhythm: Normal rate and regular rhythm.      Heart sounds: S1 normal and S2 normal. No murmur. No friction rub. No gallop. No S3 or S4 sounds.    Pulmonary:       Effort: Pulmonary effort is normal.      Breath sounds: Normal breath sounds. No wheezing, rhonchi or rales.   Abdominal:      General: Bowel sounds are normal. There is no distension.      Palpations: Abdomen is soft. There is no hepatomegaly or splenomegaly.      Tenderness: There is abdominal tenderness in the suprapubic area. There is no right CVA tenderness, left CVA tenderness, guarding or rebound.   Musculoskeletal:      Right lower leg: No edema.      Left lower leg: No edema.   Neurological:      Mental Status: She is alert.       Assessment/Plan  1. UTI  Cipro 500mg  PO every day X 7 days  Pyridium 200mg  PO TID X 2 days  PUSh PO fluids  Cranberry juice or tabs daily    2. STI Screening  Permission obtained to test for gonorrhea, chlamydia, syphilis, HIV and HSV

## 2019-11-06 NOTE — Progress Notes (Signed)
Christy [name] is a 43 y.o. female (DOB: 12/26/1975) presenting to address:    Chief Complaint   Patient presents with   ??? Abdominal Pain   ??? Urinary Burning       Vitals:    11/06/19 1314   BP: (!) 160/114   Pulse: 98   Resp: 14   Temp: 97.9 ??F (36.6 ??C)   TempSrc: Temporal   SpO2: 95%   Weight: 234 lb 9.6 oz (106.4 kg)   Height: 5' 1" (1.549 m)       Is someone accompanying this pt?  NO    Is the patient using any DME equipment during OV?  NO    Hearing/Vision:   No exam data present    Learning Assessment:   No flowsheet data found.  Depression Screening:     3 most recent PHQ Screens 11/06/2019   Little interest or pleasure in doing things Not at all   Feeling down, depressed, irritable, or hopeless Not at all   Total Score PHQ 2 0     Fall Risk Assessment:     Fall Risk Assessment, last 12 mths 10/22/2019   Able to walk? Yes   Fall in past 12 months? 0   Do you feel unsteady? 1   Are you worried about falling 1     Coordination of Care Questionaire:   1. Have you been to the ER, urgent care clinic since your last visit?  Hospitalized since your last visit? NO    2. Have you seen or consulted any other health care providers outside of the [facility] since your last visit?  Include any pap smears or colon screening. NO    Advanced Directive:   1. Do you have an Advanced Directive? NO    2. Would you like information on Advanced Directives? NO

## 2019-11-17 ENCOUNTER — Encounter: Attending: Legal Medicine | Primary: Family

## 2019-12-01 ENCOUNTER — Encounter: Payer: PRIVATE HEALTH INSURANCE | Primary: Family

## 2019-12-08 ENCOUNTER — Ambulatory Visit
Payer: PRIVATE HEALTH INSURANCE | Attending: Student in an Organized Health Care Education/Training Program | Primary: Family

## 2019-12-29 ENCOUNTER — Encounter: Payer: PRIVATE HEALTH INSURANCE | Primary: Family

## 2020-01-14 ENCOUNTER — Telehealth: Attending: Family | Primary: Family Medicine

## 2020-01-14 ENCOUNTER — Telehealth: Admit: 2020-01-14 | Discharge: 2020-01-14 | Payer: PRIVATE HEALTH INSURANCE | Attending: Family | Primary: Family

## 2020-01-14 DIAGNOSIS — I10 Essential (primary) hypertension: Secondary | ICD-10-CM

## 2020-01-14 MED ORDER — SITAGLIPTIN 100 MG TAB
100 mg | ORAL_TABLET | Freq: Every day | ORAL | 3 refills | Status: AC
Start: 2020-01-14 — End: 2020-02-13

## 2020-01-14 MED ORDER — CLONIDINE 0.2 MG TAB
0.2 mg | ORAL_TABLET | Freq: Two times a day (BID) | ORAL | 3 refills | Status: DC
Start: 2020-01-14 — End: 2020-04-28

## 2020-01-14 MED ORDER — FLUCONAZOLE 150 MG TAB
150 mg | ORAL_TABLET | ORAL | 2 refills | Status: DC
Start: 2020-01-14 — End: 2020-04-28

## 2020-01-14 MED ORDER — INDAPAMIDE 1.25 MG TAB
1.25 mg | ORAL_TABLET | Freq: Every day | ORAL | 3 refills | Status: DC
Start: 2020-01-14 — End: 2020-04-28

## 2020-01-14 MED ORDER — BYDUREON 2 MG/0.65 ML SUBCUTANEOUS PEN INJECTOR
2 mg/0.65 mL | SUBCUTANEOUS | 5 refills | Status: DC
Start: 2020-01-14 — End: 2020-09-18

## 2020-01-14 NOTE — Progress Notes (Signed)
Christy [name] is a 44 y.o. female who was seen by synchronous (real-time) audio-video technology using [email or web address] on 01/14/2020.      Mr#: [id number]    Consent:  She and/or her healthcare decision maker is aware that this patient-initiated Telehealth encounter is a billable service, with coverage as determined by her insurance carrier. She is aware that she may receive a bill and has provided verbal consent to proceed: YES    I was in the office while conducting this encounter.      712  HPI/Subjective:   Christy [name] is a 44 year old female who presents today with issues regarding blood sugar and blood pressure. States that her blood sugars have been running high lately. Checked this morning and fasting was 253. She has been taking her metformin but says that the last time she saw her PCP she asked for refills on Bydureon and Januvia but they were never given so she has been off of those medications for a few months now. Also notes that her BP has been running high. She has been compliant with taking her BP medications as prescribed but does say that she has been under more stress recently. Also thinks she has a yeast infection. Has had vaginal itching with thick white vaginal discharge for the past 4 days.         Patient Active Problem List   Diagnosis Code   ??? CAD (coronary artery disease) I25.10   ??? Hyperlipidemia with target low density lipoprotein (LDL) cholesterol less than 70 mg/dL J62.8   ??? Hypertension I10   ??? Obesity E66.9   ??? DM (diabetes mellitus) (HCC) E11.9   ??? GERD (gastroesophageal reflux disease) K21.9   ??? Chest pain R07.9   ??? Dizziness R42   ??? Obesity, morbid (HCC) E66.01   ??? Spinal stenosis M48.00   ??? IBS (irritable bowel syndrome) K58.9            Allergies   Allergen Reactions   ??? Vicodin [Hydrocodone-Acetaminophen] Itching   ??? Pcn [Penicillins] Other (comments)         Current Outpatient Medications:   ???  cloNIDine HCL (CATAPRES) 0.2 mg tablet, Take 1 Tab by mouth two (2) times a day for 30 days.,  Disp: 60 Tab, Rfl: 3  ???  SITagliptin (JANUVIA) 100 mg tablet, Take 1 Tab by mouth daily for 30 days., Disp: 30 Tab, Rfl: 3  ???  Bydureon 2 mg/0.65 mL pnij, 0.65 mL by abdominal subcutaneous route every seven (7) days for 30 doses. ONCE A WEEK, Disp: 4 Adjustable Dose Pre-filled Pen Syringe, Rfl: 5  ???  indapamide (LOZOL) 1.25 mg tablet, Take 1 Tab by mouth daily., Disp: 30 Tab, Rfl: 3  ???  fluconazole (DIFLUCAN) 150 mg tablet, Take 1 tab by mouth X1 dose. May repeat dose in 72 hours if symptoms persist., Disp: 2 Tab, Rfl: 2  ???  ARIPiprazole (ABILIFY) 5 mg tablet, 5 mg daily., Disp: , Rfl:   ???  aspirin delayed-release 81 mg tablet, 81 mg., Disp: , Rfl:   ???  clonazePAM (KlonoPIN) 1 mg tablet, 1 mg three (3) times daily., Disp: , Rfl:   ???  lamoTRIgine (LaMICtal) 25 mg tablet, 50 mg. oNCE A DAY, Disp: , Rfl:   ???  lidocaine (LIDODERM) 5 %, Apply 1 patch as directed for 12 hours every 24 hours (12 hours on, 12 hours off), Disp: , Rfl:   ???  cloNIDine HCL (CATAPRES) 0.1 mg tablet, Take 1 Tab by  mouth two (2) times a day for 90 days., Disp: 180 Tab, Rfl: 0  ???  dilTIAZem ER (CARDIZEM CD) 300 mg capsule, Take 1 Cap by mouth daily for 90 days. oNCE A DAY, Disp: 90 Cap, Rfl: 1  ???  labetaloL (NORMODYNE) 200 mg tablet, Take 1 Tab by mouth two (2) times a day for 90 days. TWICE A DAY, Disp: 180 Tab, Rfl: 1  ???  levothyroxine (SYNTHROID) 175 mcg tablet, Take 1 Tab by mouth Daily (before breakfast) for 90 days. ONCE A DAY 30 MINUTES BEFORE MEALS, Disp: 90 Tab, Rfl: 1  ???  atorvastatin (Lipitor) 20 mg tablet, Take 1 Tab by mouth daily for 90 days., Disp: 90 Tab, Rfl: 1  ???  metFORMIN (GLUCOPHAGE) 1,000 mg tablet, Take 1 Tab by mouth two (2) times daily (with meals) for 90 days., Disp: 180 Tab, Rfl: 0  ???  glucose blood VI test strips (ASCENSIA AUTODISC VI, ONE TOUCH ULTRA TEST VI) strip, by Does Not Apply route See Admin Instructions., Disp: , Rfl:   ???  lancets misc, Check blood sugar 3 times daily, Disp: 300 Each, Rfl: 5  ???  glucose blood  VI test strips (OneTouch Verio test strips) strip, Her machine is Verio flex  3/day, Disp: 300 Strip, Rfl: 5  ???  glucose blood VI test strips (OneTouch Verio test strips) strip, Check blood sugar 3 times daily, Disp: 300 Strip, Rfl: 5  ???  ondansetron hcl (ZOFRAN) 4 mg tablet, Take 1 Tab by mouth every eight (8) hours as needed for Nausea., Disp: 12 Tab, Rfl: 0  ???  gabapentin (NEURONTIN) 600 mg tablet, Take 1 Tab by mouth three (3) times daily. (Patient taking differently: Take 300 mg by mouth three (3) times daily. 4 X A DAY), Disp: 90 Tab, Rfl: 4  ???  Hydrochlorothiazide 12.5 mg tablet, Take 12.5 mg by mouth daily., Disp: 30 Tab, Rfl: 5  ???  irbesartan (AVAPRO) 150 mg tablet, Take 1 Tab by mouth nightly., Disp: 30 Tab, Rfl: 5  ???  zolpidem (AMBIEN) 10 mg tablet, Take  by mouth nightly as needed for Sleep., Disp: , Rfl:   ???  omeprazole (PRILOSEC) 20 mg capsule, Take 1 Cap by mouth two (2) times a day., Disp: 60 Cap, Rfl: 6  ???  dicyclomine (BENTYL) 10 mg capsule, Take 1 Cap by mouth three (3) times daily. PRN, Disp: 30 Cap, Rfl: 0  ???  cyclobenzaprine (FLEXERIL) 10 mg tablet, Take 1 Tab by mouth two (2) times daily as needed for Muscle Spasm(s) for up to 60 days., Disp: 60 Tab, Rfl: 0  ???  HYDROcodone-acetaminophen (NORCO) 5-325 mg per tablet, Take 1-2 tablets PO every 4-6 hours as needed for pain control.  If over the counter ibuprofen or acetaminophen was suggested, then only take the vicodin for pain not well controlled with the over the counter medication., Disp: 12 Tab, Rfl: 0      Review of Systems   Constitutional: Negative for chills, fever and malaise/fatigue.   Eyes: Negative for blurred vision and double vision.   Respiratory: Negative for cough and shortness of breath.    Cardiovascular: Negative for chest pain, palpitations and leg swelling.   Gastrointestinal: Negative for abdominal pain, constipation, diarrhea, nausea and vomiting.   Genitourinary:        + vaginal itching   Neurological: Negative for  dizziness and headaches.   Endo/Heme/Allergies: Negative for polydipsia.          PHYSICAL EXAMINATION:  Constitutional: [x]  Appears well-developed and well-nourished [x]  No apparent distress        Mental status: [x]  Alert and awake  [x]  Oriented t [x]  Able to follow commands      Eyes:   EOM    [x]   Normal        HENT: [x]  Normocephalic,      Pulmonary/Chest: [x]  Respiratory effort normal   [x]  No visualized signs of difficulty breathing or respiratory distress           Musculoskeletal:   [x]  No obvious deformities noted with regard to areas viewed           Neurological:        [x]  No apparent neurologic deficits noted in areas viewed                 Skin:        [x]  No apparent dermatologic abnormalities noted in areas viewed               Psychiatric:       [x]  Normal Affect      Other pertinent observable physical exam findings:-None noted          Assessment & Plan:   Diagnoses and all orders for this visit:    1. Essential hypertension  -     cloNIDine HCL (CATAPRES) 0.2 mg tablet; Take 1 Tab by mouth two (2) times a day for 30 days.  -     indapamide (LOZOL) 1.25 mg tablet; Take 1 Tab by mouth daily.    2. Diabetes mellitus due to underlying condition with hyperosmolarity without coma, unspecified whether long term insulin use (HCC)  -     SITagliptin (JANUVIA) 100 mg tablet; Take 1 Tab by mouth daily for 30 days.  -     Bydureon 2 mg/0.65 mL pnij; 0.65 mL by abdominal subcutaneous route every seven (7) days for 30 doses. ONCE A WEEK    3. Vulvovaginal candidiasis  -     fluconazole (DIFLUCAN) 150 mg tablet; Take 1 tab by mouth X1 dose. May repeat dose in 72 hours if symptoms persist.       FU OV within the next month for labs and A1C check    I advised her to contact the office if her condition worsens, changes, fails to improve as anticipated and with any new problems. She expressed understanding with the diagnosis(es) and plan.          This service was provided throughTelehealth, the patient is at  home and the provider is at [facility]     Pursuant to the emergency declaration under the Act and the , 1135 waiver authority and the Coronavirus Preparedness and Act, this Virtual  Visit was conducted, with patient's consent, to reduce the patient's risk of exposure to COVID-19 and provide continuity of care for an established patient.     Services were provided through a video synchronous discussion virtually to substitute for in-person clinic visit.    , NP

## 2020-01-14 NOTE — Progress Notes (Signed)
Christy [name] is a 44 y.o. female who was seen by synchronous (real-time) audio-video technology using [email or web address] on 01/14/2020.      Mr#: [id number]    Consent:  She and/or her healthcare decision maker is aware that this patient-initiated Telehealth encounter is a billable service, with coverage as determined by her insurance carrier. She is aware that she may receive a bill and has provided verbal consent to proceed: YES    I was in the office while conducting this encounter.      712  HPI/Subjective:   Christy [name] is a 44 year old female who presents today with issues regarding blood sugar and blood pressure. States that her blood sugars have been running high lately. Checked this morning and fasting was 253. She has been taking her metformin but says that the last time she saw her PCP she asked for refills on Bydureon and Januvia but they were never given so she has been off of those medications for a few months now. Also notes that her BP has been running high. She has been compliant with taking her BP medications as prescribed but does say that she has been under more stress recently. Also thinks she has a yeast infection. Has had vaginal itching with thick white vaginal discharge for the past 4 days.         Patient Active Problem List   Diagnosis Code   ??? CAD (coronary artery disease) I25.10   ??? Hyperlipidemia with target low density lipoprotein (LDL) cholesterol less than 70 mg/dL J62.8   ??? Hypertension I10   ??? Obesity E66.9   ??? DM (diabetes mellitus) (HCC) E11.9   ??? GERD (gastroesophageal reflux disease) K21.9   ??? Chest pain R07.9   ??? Dizziness R42   ??? Obesity, morbid (HCC) E66.01   ??? Spinal stenosis M48.00   ??? IBS (irritable bowel syndrome) K58.9            Allergies   Allergen Reactions   ??? Vicodin [Hydrocodone-Acetaminophen] Itching   ??? Pcn [Penicillins] Other (comments)         Current Outpatient Medications:   ???  cloNIDine HCL (CATAPRES) 0.2 mg tablet, Take 1 Tab by mouth two (2) times a day for 30 days.,  Disp: 60 Tab, Rfl: 3  ???  SITagliptin (JANUVIA) 100 mg tablet, Take 1 Tab by mouth daily for 30 days., Disp: 30 Tab, Rfl: 3  ???  Bydureon 2 mg/0.65 mL pnij, 0.65 mL by abdominal subcutaneous route every seven (7) days for 30 doses. ONCE A WEEK, Disp: 4 Adjustable Dose Pre-filled Pen Syringe, Rfl: 5  ???  indapamide (LOZOL) 1.25 mg tablet, Take 1 Tab by mouth daily., Disp: 30 Tab, Rfl: 3  ???  fluconazole (DIFLUCAN) 150 mg tablet, Take 1 tab by mouth X1 dose. May repeat dose in 72 hours if symptoms persist., Disp: 2 Tab, Rfl: 2  ???  ARIPiprazole (ABILIFY) 5 mg tablet, 5 mg daily., Disp: , Rfl:   ???  aspirin delayed-release 81 mg tablet, 81 mg., Disp: , Rfl:   ???  clonazePAM (KlonoPIN) 1 mg tablet, 1 mg three (3) times daily., Disp: , Rfl:   ???  lamoTRIgine (LaMICtal) 25 mg tablet, 50 mg. oNCE A DAY, Disp: , Rfl:   ???  lidocaine (LIDODERM) 5 %, Apply 1 patch as directed for 12 hours every 24 hours (12 hours on, 12 hours off), Disp: , Rfl:   ???  cloNIDine HCL (CATAPRES) 0.1 mg tablet, Take 1 Tab by  mouth two (2) times a day for 90 days., Disp: 180 Tab, Rfl: 0  ???  dilTIAZem ER (CARDIZEM CD) 300 mg capsule, Take 1 Cap by mouth daily for 90 days. oNCE A DAY, Disp: 90 Cap, Rfl: 1  ???  labetaloL (NORMODYNE) 200 mg tablet, Take 1 Tab by mouth two (2) times a day for 90 days. TWICE A DAY, Disp: 180 Tab, Rfl: 1  ???  levothyroxine (SYNTHROID) 175 mcg tablet, Take 1 Tab by mouth Daily (before breakfast) for 90 days. ONCE A DAY 30 MINUTES BEFORE MEALS, Disp: 90 Tab, Rfl: 1  ???  atorvastatin (Lipitor) 20 mg tablet, Take 1 Tab by mouth daily for 90 days., Disp: 90 Tab, Rfl: 1  ???  metFORMIN (GLUCOPHAGE) 1,000 mg tablet, Take 1 Tab by mouth two (2) times daily (with meals) for 90 days., Disp: 180 Tab, Rfl: 0  ???  glucose blood VI test strips (ASCENSIA AUTODISC VI, ONE TOUCH ULTRA TEST VI) strip, by Does Not Apply route See Admin Instructions., Disp: , Rfl:   ???  lancets misc, Check blood sugar 3 times daily, Disp: 300 Each, Rfl: 5  ???  glucose blood  VI test strips (OneTouch Verio test strips) strip, Her machine is Verio flex  3/day, Disp: 300 Strip, Rfl: 5  ???  glucose blood VI test strips (OneTouch Verio test strips) strip, Check blood sugar 3 times daily, Disp: 300 Strip, Rfl: 5  ???  ondansetron hcl (ZOFRAN) 4 mg tablet, Take 1 Tab by mouth every eight (8) hours as needed for Nausea., Disp: 12 Tab, Rfl: 0  ???  gabapentin (NEURONTIN) 600 mg tablet, Take 1 Tab by mouth three (3) times daily. (Patient taking differently: Take 300 mg by mouth three (3) times daily. 4 X A DAY), Disp: 90 Tab, Rfl: 4  ???  Hydrochlorothiazide 12.5 mg tablet, Take 12.5 mg by mouth daily., Disp: 30 Tab, Rfl: 5  ???  irbesartan (AVAPRO) 150 mg tablet, Take 1 Tab by mouth nightly., Disp: 30 Tab, Rfl: 5  ???  zolpidem (AMBIEN) 10 mg tablet, Take  by mouth nightly as needed for Sleep., Disp: , Rfl:   ???  omeprazole (PRILOSEC) 20 mg capsule, Take 1 Cap by mouth two (2) times a day., Disp: 60 Cap, Rfl: 6  ???  dicyclomine (BENTYL) 10 mg capsule, Take 1 Cap by mouth three (3) times daily. PRN, Disp: 30 Cap, Rfl: 0  ???  cyclobenzaprine (FLEXERIL) 10 mg tablet, Take 1 Tab by mouth two (2) times daily as needed for Muscle Spasm(s) for up to 60 days., Disp: 60 Tab, Rfl: 0  ???  HYDROcodone-acetaminophen (NORCO) 5-325 mg per tablet, Take 1-2 tablets PO every 4-6 hours as needed for pain control.  If over the counter ibuprofen or acetaminophen was suggested, then only take the vicodin for pain not well controlled with the over the counter medication., Disp: 12 Tab, Rfl: 0      Review of Systems   Constitutional: Negative for chills, fever and malaise/fatigue.   Eyes: Negative for blurred vision and double vision.   Respiratory: Negative for cough and shortness of breath.    Cardiovascular: Negative for chest pain, palpitations and leg swelling.   Gastrointestinal: Negative for abdominal pain, constipation, diarrhea, nausea and vomiting.   Genitourinary:        + vaginal itching   Neurological: Negative for  dizziness and headaches.   Endo/Heme/Allergies: Negative for polydipsia.          PHYSICAL EXAMINATION:  Constitutional: [x] Appears well-developed and well-nourished [x] No apparent distress        Mental status: [x] Alert and awake  [x] Oriented t [x] Able to follow commands      Eyes:   EOM    [x]  Normal        HENT: [x] Normocephalic,      Pulmonary/Chest: [x] Respiratory effort normal   [x] No visualized signs of difficulty breathing or respiratory distress           Musculoskeletal:   [x] No obvious deformities noted with regard to areas viewed           Neurological:        [x] No apparent neurologic deficits noted in areas viewed                 Skin:        [x] No apparent dermatologic abnormalities noted in areas viewed               Psychiatric:       [x] Normal Affect      Other pertinent observable physical exam findings:-None noted          Assessment & Plan:   Diagnoses and all orders for this visit:    1. Essential hypertension  -     cloNIDine HCL (CATAPRES) 0.2 mg tablet; Take 1 Tab by mouth two (2) times a day for 30 days.  -     indapamide (LOZOL) 1.25 mg tablet; Take 1 Tab by mouth daily.    2. Diabetes mellitus due to underlying condition with hyperosmolarity without coma, unspecified whether long term insulin use (HCC)  -     SITagliptin (JANUVIA) 100 mg tablet; Take 1 Tab by mouth daily for 30 days.  -     Bydureon 2 mg/0.65 mL pnij; 0.65 mL by abdominal subcutaneous route every seven (7) days for 30 doses. ONCE A WEEK    3. Vulvovaginal candidiasis  -     fluconazole (DIFLUCAN) 150 mg tablet; Take 1 tab by mouth X1 dose. May repeat dose in 72 hours if symptoms persist.       FU OV within the next month for labs and A1C check    I advised her to contact the office if her condition worsens, changes, fails to improve as anticipated and with any new problems. She expressed understanding with the diagnosis(es) and plan.          This service was provided throughTelehealth, the patient is at  home and the provider is at [facility]     Pursuant to the emergency declaration under the [place] Act and the National Emergencies Act, 1135 waiver authority and the Coronavirus Preparedness and Response Supplemental Appropriations Act, this Virtual  Visit was conducted, with patient's consent, to reduce the patient's risk of exposure to COVID-19 and provide continuity of care for an established patient.     Services were provided through a video synchronous discussion virtually to substitute for in-person clinic visit.    Tearia Gibbs S D'Angelo, NP

## 2020-01-22 NOTE — Telephone Encounter (Addendum)
Patient called with concerns about her high blood pressure and blood sugar . She has taken her labetalol, Diltiazem, and Lozol, has not taken clonidine yet. Bp today is 224/142 and yesterday 212/126 . She is lightheaded has a headache and some chest pain . Her blood sugar fasting is today was 236, yesterday 342 and day before that 420 . I discussed this with Boykin Reaper and patient was strongly advised multiple times to go to the Er for her blood pressure . I  Told her that Boykin Reaper is concerned with her bp being this high is putting her at risk for a stroke or Heart attack . Patient says the er will not do anything like in the past will do a ekg and tell her to follow up with her pcp . We also discussed her blood sugar she is taken metformin , bydureon and januvia and is not eating much but her sugars remain high . Scheduled her for follow up next week ,  will check a1c in office told to bring her medication bottles and blood sugar log . She voiced understanding

## 2020-01-28 ENCOUNTER — Ambulatory Visit: Attending: Family | Primary: Family Medicine

## 2020-01-28 ENCOUNTER — Ambulatory Visit: Admit: 2020-01-28 | Discharge: 2020-01-28 | Payer: PRIVATE HEALTH INSURANCE | Attending: Family | Primary: Family

## 2020-01-28 DIAGNOSIS — E1165 Type 2 diabetes mellitus with hyperglycemia: Secondary | ICD-10-CM

## 2020-01-28 LAB — AMB POC HEMOGLOBIN A1C
Hemoglobin A1C, POC: 9.4 %
Hemoglobin A1c (POC): 9.4 %

## 2020-01-28 MED ORDER — PEN NEEDLE, DIABETIC 30 GAUGE X 1/3"
30 gauge x 1/3" | PACK | 11 refills | Status: DC
Start: 2020-01-28 — End: 2021-03-16

## 2020-01-28 MED ORDER — LANTUS SOLOSTAR U-100 INSULIN 100 UNIT/ML (3 ML) SUBCUTANEOUS PEN
100 unit/mL (3 mL) | SUBCUTANEOUS | 3 refills | Status: DC
Start: 2020-01-28 — End: 2020-04-28

## 2020-01-28 NOTE — Progress Notes (Signed)
HPI  Christy [name] is a 44 y.o. year old female who presents today for follow up for diabetes. She was restarted on her Bydureon and Januvia 2 weeks ago after being off of it for 3 months. Not feeling great, feels very tired. Has been compliant with her medications since restarting. Also interested in seeing someone about possible bariatric surgery. Has had weight issues for a long time and has had a lot of difficulty with weight loss.       Past Medical History:   Diagnosis Date   ??? Acid indigestion    ??? Acid reflux    ??? Asthma    ??? CAD (coronary artery disease)    ??? Depression    ??? Diabetes (HCC)    ??? Diabetic neuropathy (HCC)    ??? 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 Enlarged heart    ??? Hypertension    ??? IBS (irritable bowel syndrome)    ??? Interstitial cystitis    ??? Sciatica    ??? Spinal stenosis        Past Surgical History:   Procedure Laterality Date   ??? HX CHOLECYSTECTOMY      2008   ??? HX ORTHOPAEDIC      LEFT KNEE   ??? HX ORTHOPAEDIC      LEFT ARM   ??? HX PARTIAL HYSTERECTOMY  2003    STILL HAVE OVARIES   ??? HX PELVIC LAPAROSCOPY     ??? HX THYROIDECTOMY         Social History     Tobacco Use   ??? Smoking status: Current Every Day Smoker     Packs/day: 0.25   ??? Smokeless tobacco: Never Used   Substance Use Topics   ??? Alcohol use: Yes     Frequency: 2-4 times a month   ??? Drug use: Yes     Types: Marijuana     Comment: ONCE EVERY 6 MONTHS         Current Outpatient Medications:   ???  insulin glargine (Lantus Solostar U-100 Insulin) 100 unit/mL (3 mL) inpn, Inject 15 units subcutaneously every night at bedtime., Disp: 45 mL, Rfl: 3  ???  Insulin Needles, Disposable, 30 gauge x 1/3", Inject subcutaneously every night at bedtime., Disp: 1 Package, Rfl: 11  ???  cloNIDine HCL (CATAPRES) 0.2 mg tablet, Take 1 Tab by mouth two (2) times a day for 30 days., Disp: 60 Tab, Rfl: 3  ???  SITagliptin (JANUVIA) 100 mg tablet, Take 1 Tab by mouth daily for 30 days., Disp: 30 Tab, Rfl: 3  ???  Bydureon 2 mg/0.65 mL pnij, 0.65 mL  by abdominal subcutaneous route every seven (7) days for 30 doses. ONCE A WEEK, Disp: 4 Adjustable Dose Pre-filled Pen Syringe, Rfl: 5  ???  indapamide (LOZOL) 1.25 mg tablet, Take 1 Tab by mouth daily., Disp: 30 Tab, Rfl: 3  ???  aspirin delayed-release 81 mg tablet, 81 mg., Disp: , Rfl:   ???  lidocaine (LIDODERM) 5 %, Apply 1 patch as directed for 12 hours every 24 hours (12 hours on, 12 hours off), Disp: , Rfl:   ???  lancets misc, Check blood sugar 3 times daily, Disp: 300 Each, Rfl: 5  ???  glucose blood VI test strips (OneTouch Verio test strips) strip, Her machine is Verio flex  3/day, Disp: 300 Strip, Rfl: 5  ???  glucose blood VI test strips (OneTouch Verio test strips) strip, Check blood  sugar 3 times daily, Disp: 300 Strip, Rfl: 5  ???  irbesartan (AVAPRO) 150 mg tablet, Take 1 Tab by mouth nightly., Disp: 30 Tab, Rfl: 5  ???  omeprazole (PRILOSEC) 20 mg capsule, Take 1 Cap by mouth two (2) times a day., Disp: 60 Cap, Rfl: 6  ???  dicyclomine (BENTYL) 10 mg capsule, Take 1 Cap by mouth three (3) times daily. PRN, Disp: 30 Cap, Rfl: 0  ???  fluconazole (DIFLUCAN) 150 mg tablet, Take 1 tab by mouth X1 dose. May repeat dose in 72 hours if symptoms persist., Disp: 2 Tab, Rfl: 2  ???  ARIPiprazole (ABILIFY) 5 mg tablet, 5 mg daily., Disp: , Rfl:   ???  clonazePAM (KlonoPIN) 1 mg tablet, 1 mg three (3) times daily., Disp: , Rfl:   ???  lamoTRIgine (LaMICtal) 25 mg tablet, 50 mg. oNCE A DAY, Disp: , Rfl:   ???  cloNIDine HCL (CATAPRES) 0.1 mg tablet, Take 1 Tab by mouth two (2) times a day for 90 days., Disp: 180 Tab, Rfl: 0  ???  dilTIAZem ER (CARDIZEM CD) 300 mg capsule, Take 1 Cap by mouth daily for 90 days. oNCE A DAY, Disp: 90 Cap, Rfl: 1  ???  labetaloL (NORMODYNE) 200 mg tablet, Take 1 Tab by mouth two (2) times a day for 90 days. TWICE A DAY, Disp: 180 Tab, Rfl: 1  ???  levothyroxine (SYNTHROID) 175 mcg tablet, Take 1 Tab by mouth Daily (before breakfast) for 90 days. ONCE A DAY 30 MINUTES BEFORE MEALS, Disp: 90 Tab, Rfl: 1  ???   atorvastatin (Lipitor) 20 mg tablet, Take 1 Tab by mouth daily for 90 days., Disp: 90 Tab, Rfl: 1  ???  metFORMIN (GLUCOPHAGE) 1,000 mg tablet, Take 1 Tab by mouth two (2) times daily (with meals) for 90 days., Disp: 180 Tab, Rfl: 0  ???  cyclobenzaprine (FLEXERIL) 10 mg tablet, Take 1 Tab by mouth two (2) times daily as needed for Muscle Spasm(s) for up to 60 days., Disp: 60 Tab, Rfl: 0  ???  glucose blood VI test strips (ASCENSIA AUTODISC VI, ONE TOUCH ULTRA TEST VI) strip, by Does Not Apply route See Admin Instructions., Disp: , Rfl:   ???  ondansetron hcl (ZOFRAN) 4 mg tablet, Take 1 Tab by mouth every eight (8) hours as needed for Nausea., Disp: 12 Tab, Rfl: 0  ???  HYDROcodone-acetaminophen (NORCO) 5-325 mg per tablet, Take 1-2 tablets PO every 4-6 hours as needed for pain control.  If over the counter ibuprofen or acetaminophen was suggested, then only take the vicodin for pain not well controlled with the over the counter medication., Disp: 12 Tab, Rfl: 0  ???  gabapentin (NEURONTIN) 600 mg tablet, Take 1 Tab by mouth three (3) times daily. (Patient taking differently: Take 300 mg by mouth three (3) times daily. 4 X A DAY), Disp: 90 Tab, Rfl: 4  ???  Hydrochlorothiazide 12.5 mg tablet, Take 12.5 mg by mouth daily., Disp: 30 Tab, Rfl: 5  ???  zolpidem (AMBIEN) 10 mg tablet, Take  by mouth nightly as needed for Sleep., Disp: , Rfl:      Allergies   Allergen Reactions   ??? Vicodin [Hydrocodone-Acetaminophen] Itching   ??? Lisinopril-Hydrochlorothiazide Other (comments)     Dry cough    ??? Pcn [Penicillins] Other (comments)       Review of Systems   Constitutional: Positive for malaise/fatigue. Negative for chills, fever and weight loss.   Eyes: Negative for blurred vision and double  vision.   Respiratory: Negative for cough and shortness of breath.    Cardiovascular: Negative for chest pain, palpitations, orthopnea and leg swelling.   Gastrointestinal: Negative for abdominal pain, constipation, diarrhea, nausea and vomiting.    Neurological: Negative for dizziness and headaches.   Endo/Heme/Allergies: Negative for polydipsia.        - polyuria, polyphagia         PE  BP 123/87    Pulse 92    Temp 97.5 ??F (36.4 ??C) (Temporal)    Resp 16    Ht 5\' 1"  (1.549 m)    Wt 235 lb (106.6 kg)    SpO2 100%    BMI 44.40 kg/m??     Physical Exam  Vitals signs reviewed.   Constitutional:       General: She is not in acute distress.     Appearance: Normal appearance.   HENT:      Head: Normocephalic and atraumatic.   Cardiovascular:      Rate and Rhythm: Normal rate and regular rhythm.      Heart sounds: S1 normal and S2 normal. No murmur. No friction rub. No gallop. No S3 or S4 sounds.    Pulmonary:      Effort: Pulmonary effort is normal.      Breath sounds: Normal breath sounds. No wheezing, rhonchi or rales.   Skin:     General: Skin is warm and dry.      Capillary Refill: Capillary refill takes less than 2 seconds.   Neurological:      Mental Status: She is alert.         Assessment/Plan  1. T2DM  A1C 9.4% (up from 8.2 in December 2020)  Continue January 2021, Januvia, metformin BID  Add Lantus 15 U every day for short term  Declines insulin teaching, knows how to self administer due to giving Bydureon injections  FU OV 3 months recheck A1C  FU sooner PRN    2. HTN  BP excellent with increase in clonidine, no changes for now    3. Obesity  Refer to  Bariatric surgery

## 2020-01-28 NOTE — Patient Instructions (Signed)
Insulin Glargine (By injection)   Insulin Glargine, Recombinant (IN-su-lin GLAR-jeen, ree-KOM-bi-nant)  Treats diabetes.   Brand Name(s): Basaglar KwikPen, Lantus, Lantus Novaplus, Lantus SoloStar, Lantus SoloStar Novaplus, Toujeo   There may be other brand names for this medicine.  When This Medicine Should Not Be Used:   This medicine is not right for everyone. Do not use it if you had an allergic reaction to insulin glargine.  How to Use This Medicine:   Injectable  ?? Your healthcare provider will work with you to personalize your dose and treatment based on your insulin needs and lifestyle. You will be taught how to give yourself the injections. Make sure you understand all instructions. Ask the doctor, nurse, or pharmacist if you have questions.  ?? If you use insulin once a day, it is best to use it at about the same time every day.  ?? Always double-check both the concentration (strength) of your insulin and your dose. Concentration and dose are not the same. The dose is how many units of insulin you will use. The concentration tells how many units of insulin are in each milliliter (mL), such as 100 units/mL (U-100), but this does not mean you will use 100 units at a time.  ?? Read and follow the patient instructions that come with this medicine. Talk to your doctor or pharmacist if you have any questions.  ?? This medicine should look clear before you use it. Do not shake the vial. Do not mix this medicine with any other insulin or with water.  ?? You will be shown the body areas where this shot can be given. Use a different body area each time you give yourself a shot. Keep track of where you give each shot to make sure you rotate body areas.  ?? Use a new needle and syringe each time you inject your medicine. If you use a syringe, use only the kind that is made for insulin injections. Some insulin must be given with a specific type of syringe or needle. Ask your pharmacist if you are not sure which one to use.   ?? Always check the label before use, to make sure you have the correct type of insulin. Do not change the brand, type, or concentration unless your doctor tells you to. If you use a pump or other device, make sure the insulin is made for that device.  ?? Unopened medicine: Store the vials, cartridges, and SoloStar?? pens in the refrigerator. Protect from light. Do not freeze.  ?? Opened medicine:   ?? Vials: Store in the refrigerator or at room temperature in a cool place, away from sunlight and heat. Use within 28 days.  ?? Cartridge or SoloStar?? pen: Store at room temperature, away from direct heat and light. Do not refrigerate. Throw away any opened cartridge or Lantus?? SoloStar?? pen after 28 days. Throw away any opened Toujeo?? SoloStar?? pen after 42 days.  ?? Throw away used needles in a hard, closed container that the needles cannot poke through. Keep this container away from children and pets.  Drugs and Foods to Avoid:   Ask your doctor or pharmacist before using any other medicine, including over-the-counter medicines, vitamins, and herbal products.  ?? Some medicines can change the amount of insulin you need to use and make it harder for you to control your diabetes. Tell your doctor about all other medicines that you are using.  ?? Do not drink alcohol while you are using this medicine.  Warnings   While Using This Medicine:   ?? Tell your doctor if you are pregnant or breastfeeding, or if you have kidney disease, liver disease, heart disease, or heart failure.  ?? This medicine may cause the following problems:  ?? Low blood sugar or low potassium levels in the blood  ?? Fluid retention or heart failure (when used with thiazolidinedione [TZD] medicine)  ?? Never share insulin pens, needles, or cartridges with anyone. Sharing these can pass hepatitis viruses, HIV, or other illnesses from one person to another.  ?? Keep all medicine out of the reach of children. Never share your medicine with anyone.  Possible Side  Effects While Using This Medicine:   Call your doctor right away if you notice any of these side effects:  ?? Allergic reaction: Itching or hives, swelling in your face or hands, swelling or tingling in your mouth or throat, chest tightness, trouble breathing  ?? Dry mouth, increased thirst, muscle cramps, nausea or vomiting, uneven heartbeat  ?? Rapid weight gain, swelling in your hands, ankles, or feet, trouble breathing, tiredness  ?? Shaking, trembling, sweating, fast or pounding heartbeat, lightheadedness, hunger, confusion  If you notice these less serious side effects, talk with your doctor:   ?? Redness, pain, itching, swelling, or any skin changes where the shot was given  If you notice other side effects that you think are caused by this medicine, tell your doctor.   Call your doctor for medical advice about side effects. You may report side effects to FDA at 1-800-FDA-1088  ?? 2017 [employer] Information is for End User's use only and may not be sold, redistributed or otherwise used for commercial purposes.  The above information is an educational aid only. It is not intended as medical advice for individual conditions or treatments. Talk to your doctor, nurse or pharmacist before following any medical regimen to see if it is safe and effective for you.

## 2020-01-28 NOTE — Progress Notes (Signed)
HPI  Christy [name] is a 43 y.o. year old female who presents today for follow up for diabetes. She was restarted on her Bydureon and Januvia 2 weeks ago after being off of it for 3 months. Not feeling great, feels very tired. Has been compliant with her medications since restarting. Also interested in seeing someone about possible bariatric surgery. Has had weight issues for a long time and has had a lot of difficulty with weight loss.       Past Medical History:   Diagnosis Date   ??? Acid indigestion    ??? Acid reflux    ??? Asthma    ??? CAD (coronary artery disease)    ??? Depression    ??? Diabetes (HCC)    ??? Diabetic neuropathy (HCC)    ??? 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 Enlarged heart    ??? Hypertension    ??? IBS (irritable bowel syndrome)    ??? Interstitial cystitis    ??? Sciatica    ??? Spinal stenosis        Past Surgical History:   Procedure Laterality Date   ??? HX CHOLECYSTECTOMY      2008   ??? HX ORTHOPAEDIC      LEFT KNEE   ??? HX ORTHOPAEDIC      LEFT ARM   ??? HX PARTIAL HYSTERECTOMY  2003    STILL HAVE OVARIES   ??? HX PELVIC LAPAROSCOPY     ??? HX THYROIDECTOMY         Social History     Tobacco Use   ??? Smoking status: Current Every Day Smoker     Packs/day: 0.25   ??? Smokeless tobacco: Never Used   Substance Use Topics   ??? Alcohol use: Yes     Frequency: 2-4 times a month   ??? Drug use: Yes     Types: Marijuana     Comment: ONCE EVERY 6 MONTHS         Current Outpatient Medications:   ???  insulin glargine (Lantus Solostar U-100 Insulin) 100 unit/mL (3 mL) inpn, Inject 15 units subcutaneously every night at bedtime., Disp: 45 mL, Rfl: 3  ???  Insulin Needles, Disposable, 30 gauge x 1/3", Inject subcutaneously every night at bedtime., Disp: 1 Package, Rfl: 11  ???  cloNIDine HCL (CATAPRES) 0.2 mg tablet, Take 1 Tab by mouth two (2) times a day for 30 days., Disp: 60 Tab, Rfl: 3  ???  SITagliptin (JANUVIA) 100 mg tablet, Take 1 Tab by mouth daily for 30 days., Disp: 30 Tab, Rfl: 3  ???  Bydureon 2 mg/0.65 mL pnij, 0.65 mL  by abdominal subcutaneous route every seven (7) days for 30 doses. ONCE A WEEK, Disp: 4 Adjustable Dose Pre-filled Pen Syringe, Rfl: 5  ???  indapamide (LOZOL) 1.25 mg tablet, Take 1 Tab by mouth daily., Disp: 30 Tab, Rfl: 3  ???  aspirin delayed-release 81 mg tablet, 81 mg., Disp: , Rfl:   ???  lidocaine (LIDODERM) 5 %, Apply 1 patch as directed for 12 hours every 24 hours (12 hours on, 12 hours off), Disp: , Rfl:   ???  lancets misc, Check blood sugar 3 times daily, Disp: 300 Each, Rfl: 5  ???  glucose blood VI test strips (OneTouch Verio test strips) strip, Her machine is Verio flex  3/day, Disp: 300 Strip, Rfl: 5  ???  glucose blood VI test strips (OneTouch Verio test strips) strip, Check blood   sugar 3 times daily, Disp: 300 Strip, Rfl: 5  ???  irbesartan (AVAPRO) 150 mg tablet, Take 1 Tab by mouth nightly., Disp: 30 Tab, Rfl: 5  ???  omeprazole (PRILOSEC) 20 mg capsule, Take 1 Cap by mouth two (2) times a day., Disp: 60 Cap, Rfl: 6  ???  dicyclomine (BENTYL) 10 mg capsule, Take 1 Cap by mouth three (3) times daily. PRN, Disp: 30 Cap, Rfl: 0  ???  fluconazole (DIFLUCAN) 150 mg tablet, Take 1 tab by mouth X1 dose. May repeat dose in 72 hours if symptoms persist., Disp: 2 Tab, Rfl: 2  ???  ARIPiprazole (ABILIFY) 5 mg tablet, 5 mg daily., Disp: , Rfl:   ???  clonazePAM (KlonoPIN) 1 mg tablet, 1 mg three (3) times daily., Disp: , Rfl:   ???  lamoTRIgine (LaMICtal) 25 mg tablet, 50 mg. oNCE A DAY, Disp: , Rfl:   ???  cloNIDine HCL (CATAPRES) 0.1 mg tablet, Take 1 Tab by mouth two (2) times a day for 90 days., Disp: 180 Tab, Rfl: 0  ???  dilTIAZem ER (CARDIZEM CD) 300 mg capsule, Take 1 Cap by mouth daily for 90 days. oNCE A DAY, Disp: 90 Cap, Rfl: 1  ???  labetaloL (NORMODYNE) 200 mg tablet, Take 1 Tab by mouth two (2) times a day for 90 days. TWICE A DAY, Disp: 180 Tab, Rfl: 1  ???  levothyroxine (SYNTHROID) 175 mcg tablet, Take 1 Tab by mouth Daily (before breakfast) for 90 days. ONCE A DAY 30 MINUTES BEFORE MEALS, Disp: 90 Tab, Rfl: 1  ???   atorvastatin (Lipitor) 20 mg tablet, Take 1 Tab by mouth daily for 90 days., Disp: 90 Tab, Rfl: 1  ???  metFORMIN (GLUCOPHAGE) 1,000 mg tablet, Take 1 Tab by mouth two (2) times daily (with meals) for 90 days., Disp: 180 Tab, Rfl: 0  ???  cyclobenzaprine (FLEXERIL) 10 mg tablet, Take 1 Tab by mouth two (2) times daily as needed for Muscle Spasm(s) for up to 60 days., Disp: 60 Tab, Rfl: 0  ???  glucose blood VI test strips (ASCENSIA AUTODISC VI, ONE TOUCH ULTRA TEST VI) strip, by Does Not Apply route See Admin Instructions., Disp: , Rfl:   ???  ondansetron hcl (ZOFRAN) 4 mg tablet, Take 1 Tab by mouth every eight (8) hours as needed for Nausea., Disp: 12 Tab, Rfl: 0  ???  HYDROcodone-acetaminophen (NORCO) 5-325 mg per tablet, Take 1-2 tablets PO every 4-6 hours as needed for pain control.  If over the counter ibuprofen or acetaminophen was suggested, then only take the vicodin for pain not well controlled with the over the counter medication., Disp: 12 Tab, Rfl: 0  ???  gabapentin (NEURONTIN) 600 mg tablet, Take 1 Tab by mouth three (3) times daily. (Patient taking differently: Take 300 mg by mouth three (3) times daily. 4 X A DAY), Disp: 90 Tab, Rfl: 4  ???  Hydrochlorothiazide 12.5 mg tablet, Take 12.5 mg by mouth daily., Disp: 30 Tab, Rfl: 5  ???  zolpidem (AMBIEN) 10 mg tablet, Take  by mouth nightly as needed for Sleep., Disp: , Rfl:      Allergies   Allergen Reactions   ??? Vicodin [Hydrocodone-Acetaminophen] Itching   ??? Lisinopril-Hydrochlorothiazide Other (comments)     Dry cough    ??? Pcn [Penicillins] Other (comments)       Review of Systems   Constitutional: Positive for malaise/fatigue. Negative for chills, fever and weight loss.   Eyes: Negative for blurred vision and double  vision.   Respiratory: Negative for cough and shortness of breath.    Cardiovascular: Negative for chest pain, palpitations, orthopnea and leg swelling.   Gastrointestinal: Negative for abdominal pain, constipation, diarrhea, nausea and vomiting.    Neurological: Negative for dizziness and headaches.   Endo/Heme/Allergies: Negative for polydipsia.        - polyuria, polyphagia         PE  BP 123/87    Pulse 92    Temp 97.5 ??F (36.4 ??C) (Temporal)    Resp 16    Ht 5\' 1"  (1.549 m)    Wt 235 lb (106.6 kg)    SpO2 100%    BMI 44.40 kg/m??     Physical Exam  Vitals signs reviewed.   Constitutional:       General: She is not in acute distress.     Appearance: Normal appearance.   HENT:      Head: Normocephalic and atraumatic.   Cardiovascular:      Rate and Rhythm: Normal rate and regular rhythm.      Heart sounds: S1 normal and S2 normal. No murmur. No friction rub. No gallop. No S3 or S4 sounds.    Pulmonary:      Effort: Pulmonary effort is normal.      Breath sounds: Normal breath sounds. No wheezing, rhonchi or rales.   Skin:     General: Skin is warm and dry.      Capillary Refill: Capillary refill takes less than 2 seconds.   Neurological:      Mental Status: She is alert.         Assessment/Plan  1. T2DM  A1C 9.4% (up from 8.2 in December 2020)  Continue January 2021, Januvia, metformin BID  Add Lantus 15 U every day for short term  Declines insulin teaching, knows how to self administer due to giving Bydureon injections  FU OV 3 months recheck A1C  FU sooner PRN    2. HTN  BP excellent with increase in clonidine, no changes for now    3. Obesity  Refer to  Bariatric surgery

## 2020-01-28 NOTE — Telephone Encounter (Signed)
Pharmacy called stating that the needle gauge doesn't come in 30 guage but comes in 32 guage and would like to know if it okay to change it, please advise.

## 2020-01-30 MED ORDER — PEN NEEDLE, DIABETIC 32 GAUGE X 5/32"
32 gauge x 5/" | PEN_INJECTOR | Freq: Every day | 3 refills | Status: DC
Start: 2020-01-30 — End: 2020-08-18

## 2020-01-30 NOTE — Telephone Encounter (Signed)
New RX for 32 gauge needles sent in.

## 2020-02-02 ENCOUNTER — Inpatient Hospital Stay: Payer: PRIVATE HEALTH INSURANCE | Primary: Family

## 2020-02-04 NOTE — Telephone Encounter (Signed)
Called and spoke with patient. Pt stated she is still taking metformin and cartia. [facility] sent [facility] form saying she is not adherent to medication

## 2020-02-04 NOTE — Telephone Encounter (Signed)
Noted Than you

## 2020-02-18 MED ORDER — BYDUREON BCISE 2 MG/0.85 ML SUBCUTANEOUS AUTO-INJECTOR
2 mg/0.85 mL | INJECTION | SUBCUTANEOUS | 3 refills | Status: DC
Start: 2020-02-18 — End: 2020-04-28

## 2020-02-18 NOTE — Telephone Encounter (Signed)
Pharmacy called the Bydureon 2mg  has been discontinued . They are wanting to know if its okay to change to [facility] and if the dosing needs to be changed or say the same .

## 2020-02-18 NOTE — Telephone Encounter (Signed)
New order placed. See directions on new script.

## 2020-02-23 NOTE — Telephone Encounter (Signed)
Bydureon [facility] requires prior authorization . Form received from covermymeds. Suggest alternatives  Victoza , Januvia 100 mg , Basaglar kwikipen , Byetta are covered without prior auth .

## 2020-02-27 NOTE — Telephone Encounter (Signed)
Call and let her know that the new Bydureon is going to require PA. She has two choices - she can do daily injections or I can add another pill that will be once a day.

## 2020-02-27 NOTE — Telephone Encounter (Signed)
I spoke with patient she would prefer to do a daily injection .

## 2020-03-05 MED ORDER — LIRAGLUTIDE 0.6 MG/0.1 ML (18 MG/3 ML) SUB-Q PEN INJECTOR
0.6 mg/0.1 mL (18 mg/3 mL) | SUBCUTANEOUS | 3 refills | Status: DC
Start: 2020-03-05 — End: 2020-04-28

## 2020-03-05 NOTE — Telephone Encounter (Signed)
Script for victoza sent in.

## 2020-03-05 NOTE — Telephone Encounter (Signed)
Patient notified of message . She asked if she was also suppose to be on glimepiride,  she doesn't have any and it was given to her by previous physician . She is on Januvia, metformin and then Lantus and rachelle has added Victoza since bydureon isn't a preferred medication with her insurance . I told patient I did not see in her last note that Glimepiride was not suggested and continue current plan she voiced understanding .

## 2020-04-28 ENCOUNTER — Ambulatory Visit: Attending: Family | Primary: Family Medicine

## 2020-04-28 ENCOUNTER — Ambulatory Visit: Admit: 2020-04-28 | Discharge: 2020-04-28 | Payer: PRIVATE HEALTH INSURANCE | Attending: Family | Primary: Family

## 2020-04-28 DIAGNOSIS — E1165 Type 2 diabetes mellitus with hyperglycemia: Secondary | ICD-10-CM

## 2020-04-28 LAB — AMB POC HEMOGLOBIN A1C
Hemoglobin A1C, POC: 7.9 %
Hemoglobin A1c (POC): 7.9 %

## 2020-04-28 MED ORDER — LANTUS SOLOSTAR U-100 INSULIN 100 UNIT/ML (3 ML) SUBCUTANEOUS PEN
100 unit/mL (3 mL) | SUBCUTANEOUS | 3 refills | Status: DC
Start: 2020-04-28 — End: 2021-10-12

## 2020-04-28 MED ORDER — LIRAGLUTIDE 0.6 MG/0.1 ML (18 MG/3 ML) SUB-Q PEN INJECTOR
0.6 mg/0.1 mL (18 mg/3 mL) | SUBCUTANEOUS | 3 refills | Status: DC
Start: 2020-04-28 — End: 2020-12-22

## 2020-04-28 MED ORDER — CLONIDINE 0.2 MG TAB
0.2 mg | ORAL_TABLET | Freq: Two times a day (BID) | ORAL | 3 refills | Status: DC
Start: 2020-04-28 — End: 2021-03-07

## 2020-04-28 NOTE — Telephone Encounter (Signed)
Telephone Encounter by Rande Brunt, LPN at 16/10/96 1044                Author: Rande Brunt, LPN  Service: --  Author Type: Licensed Nurse       Filed: 04/28/20 1044  Encounter Date: 04/28/2020  Status: Signed          Editor: Rande Brunt, LPN (Licensed Nurse)          From: Debby Bud MasonTo: [facility] AssociatesSent: 04/28/2020 10:43 AM EDTSubject: Update Medical InformationI'm sending over my medication  list Metformin 1000mg  2dayDiltiazem 300mg  2dayLabetalol 200mg  2dayIndapamide 1.25mg  1dayAtorvastatin 20mg  1dayJanuvia 100mg  1dayLevothyroxin 1dayLantus pin 10units 1dayVictoza 1.2mg  1dayVistaril 50mg  one4times per day as needed Latuda 20mg  1dayPaxil  30mg  1dayI need refills on Indapamide , Lantus , and you said you was going to prescribe me something for the pain due to my back and Neuropathy also I made my appointment to come back in a week I'm scheduled for July 7th at 10am. The new pharmacy is  [facility] on . thank you

## 2020-04-28 NOTE — Progress Notes (Signed)
HPI  Christy [name] is a 44 y.o. year old female who presents today for follow up for diabetes and blood pressure. She has been taking her medications as prescribed and checking her blood pressure at home. Typically gets readings of 110s-120s/90s. Did not take any of her blood pressure medications today because she wasn't sure what we would be doing in the office.     Wants to know if she can get something for her pain. Has spinal stenosis and neuropathy which have been bothering her. Has tried lyrica and gabapentin in the past, neither of which worked well or gave her much relief.       Past Medical History:   Diagnosis Date   ??? Acid indigestion    ??? Acid reflux    ??? Asthma    ??? CAD (coronary artery disease)    ??? Depression    ??? Diabetes (HCC)    ??? Diabetic neuropathy (HCC)    ??? 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 Enlarged heart    ??? Hypertension    ??? IBS (irritable bowel syndrome)    ??? Interstitial cystitis    ??? Sciatica    ??? Spinal stenosis        Past Surgical History:   Procedure Laterality Date   ??? HX CHOLECYSTECTOMY      2008   ??? HX ORTHOPAEDIC      LEFT KNEE   ??? HX ORTHOPAEDIC      LEFT ARM   ??? HX PARTIAL HYSTERECTOMY  2003    STILL HAVE OVARIES   ??? HX PELVIC LAPAROSCOPY     ??? HX THYROIDECTOMY         Social History     Tobacco Use   ??? Smoking status: Current Every Day Smoker     Packs/day: 0.25   ??? Smokeless tobacco: Never Used   Substance Use Topics   ??? Alcohol use: Yes   ??? Drug use: Yes     Types: Marijuana     Comment: ONCE EVERY 6 MONTHS         Current Outpatient Medications:   ???  liraglutide (VICTOZA) 0.6 mg/0.1 mL (18 mg/3 mL) pnij, Administer 1.8mg  subcutaneously once a day, Disp: 3 mL, Rfl: 3  ???  insulin glargine (Lantus Solostar U-100 Insulin) 100 unit/mL (3 mL) inpn, Inject 10 units subcutaneously every night at bedtime., Disp: 45 mL, Rfl: 3  ???  cloNIDine HCL (CATAPRES) 0.2 mg tablet, Take 1 Tablet by mouth two (2) times a day for 30 days., Disp: 60 Tablet, Rfl: 3  ???  Insulin Needles,  Disposable, (Pen Needle) 32 gauge x 5/32" ndle, 100 Each by Does Not Apply route daily., Disp: 100 Pen Needle, Rfl: 3  ???  Insulin Needles, Disposable, 30 gauge x 1/3", Inject subcutaneously every night at bedtime., Disp: 1 Package, Rfl: 11  ???  aspirin delayed-release 81 mg tablet, 81 mg., Disp: , Rfl:   ???  lamoTRIgine (LaMICtal) 25 mg tablet, 50 mg. oNCE A DAY, Disp: , Rfl:   ???  glucose blood VI test strips (ASCENSIA AUTODISC VI, ONE TOUCH ULTRA TEST VI) strip, by Does Not Apply route See Admin Instructions., Disp: , Rfl:   ???  lancets misc, Check blood sugar 3 times daily, Disp: 300 Each, Rfl: 5  ???  glucose blood VI test strips (OneTouch Verio test strips) strip, Her machine is Verio flex  3/day, Disp: 300 Strip, Rfl: 5  ???  irbesartan (AVAPRO) 150 mg tablet, Take 1 Tab by mouth nightly., Disp: 30 Tab, Rfl: 5  ???  Bydureon 2 mg/0.65 mL pnij, 0.65 mL by abdominal subcutaneous route every seven (7) days for 30 doses. ONCE A WEEK (Patient not taking: Reported on 04/28/2020), Disp: 4 Adjustable Dose Pre-filled Pen Syringe, Rfl: 5  ???  ARIPiprazole (ABILIFY) 5 mg tablet, 5 mg daily. (Patient not taking: Reported on 04/28/2020), Disp: , Rfl:   ???  labetaloL (NORMODYNE) 200 mg tablet, Take 1 Tab by mouth two (2) times a day for 90 days. TWICE A DAY, Disp: 180 Tab, Rfl: 1  ???  levothyroxine (SYNTHROID) 175 mcg tablet, Take 1 Tab by mouth Daily (before breakfast) for 90 days. ONCE A DAY 30 MINUTES BEFORE MEALS, Disp: 90 Tab, Rfl: 1  ???  atorvastatin (Lipitor) 20 mg tablet, Take 1 Tab by mouth daily for 90 days., Disp: 90 Tab, Rfl: 1  ???  metFORMIN (GLUCOPHAGE) 1,000 mg tablet, Take 1 Tab by mouth two (2) times daily (with meals) for 90 days., Disp: 180 Tab, Rfl: 0  ???  cyclobenzaprine (FLEXERIL) 10 mg tablet, Take 1 Tab by mouth two (2) times daily as needed for Muscle Spasm(s) for up to 60 days., Disp: 60 Tab, Rfl: 0     Allergies   Allergen Reactions   ??? Vicodin [Hydrocodone-Acetaminophen] Itching   ??? Lisinopril-Hydrochlorothiazide  Other (comments)     Dry cough    ??? Pcn [Penicillins] Other (comments)       Review of Systems   Constitutional: Negative for chills, fever, malaise/fatigue and weight loss.   HENT: Negative for congestion, ear pain, hearing loss, sinus pain, sore throat and tinnitus.    Eyes: Negative for blurred vision, double vision, photophobia and pain.   Respiratory: Negative for cough and shortness of breath.    Cardiovascular: Negative for chest pain, palpitations and leg swelling.   Gastrointestinal: Negative for abdominal pain, constipation, diarrhea, heartburn, nausea and vomiting.   Genitourinary: Negative for dysuria, frequency and urgency.   Musculoskeletal: Negative for back pain, joint pain and myalgias.   Skin: Negative for rash.   Neurological: Negative for dizziness and headaches.   Psychiatric/Behavioral: Negative for depression and suicidal ideas. The patient is not nervous/anxious.          PE  BP (!) 140/110    Pulse (!) 113    Temp 98 ??F (36.7 ??C) (Temporal)    Resp 16    Ht 5\' 1"  (1.549 m)    Wt 228 lb (103.4 kg)    SpO2 97%    BMI 43.08 kg/m??     Physical Exam  Vitals reviewed.   Constitutional:       General: She is not in acute distress.     Appearance: Normal appearance.   HENT:      Head: Normocephalic and atraumatic.   Cardiovascular:      Rate and Rhythm: Normal rate and regular rhythm.      Heart sounds: S1 normal and S2 normal. No murmur heard.   No friction rub. No gallop. No S3 or S4 sounds.    Pulmonary:      Effort: Pulmonary effort is normal.      Breath sounds: Normal breath sounds. No wheezing, rhonchi or rales.   Musculoskeletal:      Right lower leg: No edema.      Left lower leg: No edema.   Skin:     General: Skin is warm and dry.  Capillary Refill: Capillary refill takes less than 2 seconds.   Neurological:      Mental Status: She is alert and oriented to person, place, and time.         Assessment/Plan  1. T2DM  A1C 7.9 (down from 9.4)  Increase Victoza to 1.8 every day  Decrease  Lantus to 10U every day  FU OV 3 months    2. HTN  BP elevated, did not take medications this AM  FU OV within next week for nurse visit to check BP  Can adjust from there based on reading (instructed to make sure she takes her medications prior to OV)

## 2020-04-28 NOTE — Progress Notes (Signed)
 Christy [name] is a 44 y.o. female (DOB: 1976-07-11) presenting to address:    Chief Complaint   Patient presents with   . Diabetes   . Hypertension       Vitals:    04/28/20 0901 04/28/20 0915   BP: (!) 144/103 (!) 140/110   Pulse: (!) 113    Resp: 16    Temp: 98 F (36.7 C)    TempSrc: Temporal    SpO2: 97%    Weight: 228 lb (103.4 kg)    Height: 5' 1 (1.549 m)    PainSc:   8    PainLoc: Back        Hearing/Vision:   No exam data present    Learning Assessment:   No flowsheet data found.  Depression Screening:     3 most recent PHQ Screens 04/28/2020   Little interest or pleasure in doing things More than half the days   Feeling down, depressed, irritable, or hopeless More than half the days   Total Score PHQ 2 4     Fall Risk Assessment:     Fall Risk Assessment, last 12 mths 10/22/2019   Able to walk? Yes   Fall in past 12 months? 0   Do you feel unsteady? 1   Are you worried about falling 1     Abuse Screening:     Abuse Screening Questionnaire 10/22/2019   Do you ever feel afraid of your partner? N   Are you in a relationship with someone who physically or mentally threatens you? N   Is it safe for you to go home? Y     Coordination of Care Questionaire:   1. Have you been to the ER, urgent care clinic since your last visit?  Hospitalized since your last visit? NO    2. Have you seen or consulted any other health care providers outside of the [facility] since your last visit?  Include any pap smears or colon screening. NO    Advanced Directive:   1. Do you have an Advanced Directive? YES    2. Would you like information on Advanced Directives? NO

## 2020-05-04 ENCOUNTER — Encounter

## 2020-05-04 MED ORDER — CYCLOBENZAPRINE 10 MG TAB
10 mg | ORAL_TABLET | Freq: Two times a day (BID) | ORAL | 0 refills | Status: DC | PRN
Start: 2020-05-04 — End: 2020-07-06

## 2020-05-04 MED ORDER — LABETALOL 200 MG TAB
200 mg | ORAL_TABLET | Freq: Two times a day (BID) | ORAL | 1 refills | Status: DC
Start: 2020-05-04 — End: 2021-03-24

## 2020-05-04 NOTE — Telephone Encounter (Signed)
Telephone Encounter by Catarina Hartshorn at 05/04/20 4917                Author: Catarina Hartshorn  Service: --  Author Type: --       Filed: 05/04/20 0822  Encounter Date: 05/02/2020  Status: Signed          Editor: Catarina Hartshorn          From: Christy Bud MasonTo: [facility] AssociatesSent: 05/02/2020  8:44 AM EDTSubject: Prescription QuestionI requested for a refill of my Indapamide  1.2mg  last week and still it hasn't been called in and now I'm out of it. Please refill my blood pressure medicine I need it thank you.

## 2020-05-04 NOTE — Telephone Encounter (Signed)
Flexeril   Last Office visit:  04/28/2020    Last Filled: 10/22/2019; Qty 60 w/ o refills     Follow up visit:    Future Appointments   Date Time Provider Department Center   05/05/2020 10:00 AM Venetia Night, NP [facility]         Labetalol  Last filled: 10/22/2019; Qty 180 w/ 1 refill

## 2020-05-05 ENCOUNTER — Encounter

## 2020-05-05 ENCOUNTER — Institutional Professional Consult (permissible substitution): Admit: 2020-05-05 | Discharge: 2020-05-05 | Payer: PRIVATE HEALTH INSURANCE | Attending: Family | Primary: Family

## 2020-05-05 DIAGNOSIS — Z0130 Encounter for examination of blood pressure without abnormal findings: Secondary | ICD-10-CM

## 2020-05-05 MED ORDER — INDAPAMIDE 1.25 MG TAB
1.25 mg | ORAL_TABLET | Freq: Every day | ORAL | 3 refills | Status: DC
Start: 2020-05-05 — End: 2020-10-11

## 2020-05-05 NOTE — Progress Notes (Signed)
Bp reading reviewed with Rachelle and patient advised to continue medication and follow up in 3 months . Patient also wanted to know if there was any concern for some burning for the last month when she injects her insulin per Boykin Reaper this is normal patient voiced understanding .

## 2020-05-19 ENCOUNTER — Encounter

## 2020-05-19 MED ORDER — GABAPENTIN 100 MG CAP
100 mg | ORAL_CAPSULE | Freq: Three times a day (TID) | ORAL | 1 refills | Status: DC
Start: 2020-05-19 — End: 2021-03-24

## 2020-07-04 ENCOUNTER — Encounter

## 2020-07-06 MED ORDER — CYCLOBENZAPRINE 10 MG TAB
10 mg | ORAL_TABLET | Freq: Two times a day (BID) | ORAL | 0 refills | Status: DC | PRN
Start: 2020-07-06 — End: 2020-11-30

## 2020-07-29 ENCOUNTER — Encounter

## 2020-07-29 NOTE — Telephone Encounter (Signed)
Last refill was 10/22/2019 qty of 90 with 1 refill.  No future appointments.   Last appointment was 04/28/2020

## 2020-08-02 MED ORDER — LANCETS
5 refills | Status: DC
Start: 2020-08-02 — End: 2021-03-16

## 2020-08-02 MED ORDER — ATORVASTATIN 20 MG TAB
20 mg | ORAL_TABLET | Freq: Every day | ORAL | 1 refills | Status: DC
Start: 2020-08-02 — End: 2021-03-24

## 2020-08-02 MED ORDER — METFORMIN 1,000 MG TAB
1000 mg | ORAL_TABLET | Freq: Two times a day (BID) | ORAL | 0 refills | Status: DC
Start: 2020-08-02 — End: 2020-08-18

## 2020-08-02 MED ORDER — ONETOUCH VERIO TEST STRIPS
ORAL_STRIP | 5 refills | Status: DC
Start: 2020-08-02 — End: 2021-03-16

## 2020-08-02 MED ORDER — LEVOTHYROXINE 175 MCG TAB
175 mcg | ORAL_TABLET | Freq: Every day | ORAL | 1 refills | Status: DC
Start: 2020-08-02 — End: 2020-08-18

## 2020-08-18 ENCOUNTER — Ambulatory Visit: Attending: Family | Primary: Family Medicine

## 2020-08-18 ENCOUNTER — Ambulatory Visit: Admit: 2020-08-18 | Discharge: 2020-08-18 | Payer: PRIVATE HEALTH INSURANCE | Attending: Family | Primary: Family

## 2020-08-18 DIAGNOSIS — Z111 Encounter for screening for respiratory tuberculosis: Secondary | ICD-10-CM

## 2020-08-18 MED ORDER — CLONIDINE 0.3 MG TAB
0.3 mg | ORAL_TABLET | Freq: Two times a day (BID) | ORAL | 1 refills | Status: DC
Start: 2020-08-18 — End: 2021-08-23

## 2020-08-18 MED ORDER — DILTIAZEM ER 300 MG 24 HR CAP
300 mg | ORAL_CAPSULE | Freq: Every day | ORAL | 1 refills | Status: DC
Start: 2020-08-18 — End: 2021-01-26

## 2020-08-18 MED ORDER — METFORMIN 1,000 MG TAB
1000 mg | ORAL_TABLET | Freq: Two times a day (BID) | ORAL | 1 refills | Status: DC
Start: 2020-08-18 — End: 2021-09-06

## 2020-08-18 MED ORDER — FLUCONAZOLE 150 MG TAB
150 mg | ORAL_TABLET | ORAL | 0 refills | Status: DC
Start: 2020-08-18 — End: 2020-11-30

## 2020-08-18 MED ORDER — PEN NEEDLE, DIABETIC 32 GAUGE X 5/32"
32 gauge x 5/" | PEN_INJECTOR | Freq: Every day | 3 refills | Status: DC
Start: 2020-08-18 — End: 2021-03-16

## 2020-08-18 MED ORDER — LEVOTHYROXINE 175 MCG TAB
175 mcg | ORAL_TABLET | Freq: Every day | ORAL | 1 refills | Status: DC
Start: 2020-08-18 — End: 2021-03-16

## 2020-08-18 NOTE — Progress Notes (Signed)
 Christy [name] is a 44 y.o. female (DOB: [birth year]) presenting to address:    Chief Complaint   Patient presents with   . Medication Management     follow up   . Immunization/Injection     flu shot and TB Placement       Vitals:    08/18/20 1423   BP: 139/82   Pulse: (!) 106   Resp: 16   Temp: 97.4 F (36.3 C)   TempSrc: Temporal   SpO2: 99%   Weight: 226 lb (102.5 kg)   Height: 5' 1 (1.549 m)       Hearing/Vision:   No exam data present    Learning Assessment:   No flowsheet data found.  Depression Screening:     3 most recent PHQ Screens 04/28/2020   Little interest or pleasure in doing things More than half the days   Feeling down, depressed, irritable, or hopeless More than half the days   Total Score PHQ 2 4     Fall Risk Assessment:     Fall Risk Assessment, last 12 mths 10/22/2019   Able to walk? Yes   Fall in past 12 months? 0   Do you feel unsteady? 1   Are you worried about falling 1     Abuse Screening:     Abuse Screening Questionnaire 08/18/2020   Do you ever feel afraid of your partner? N   Are you in a relationship with someone who physically or mentally threatens you? N   Is it safe for you to go home? Y     Coordination of Care Questionaire:   1. Have you been to the ER, urgent care clinic since your last visit?  Hospitalized since your last visit? YES    2. Have you seen or consulted any other health care providers outside of the [facility] since your last visit?  Include any pap smears or colon screening. NO    Advanced Directive:   1. Do you have an Advanced Directive? YES    2. Would you like information on Advanced Directives? NO    Flu Immunization/s administered 08/18/2020 by Rosina Ned with patient's consent.    Patient tolerated procedure well.  No reactions noted.

## 2020-08-18 NOTE — Progress Notes (Signed)
HPI  Christy [name] is a 44 y.o. year old female who presents today for medication refills and referrals. She has been having issues with the quantity of pen needles that she has been receiving so she has not been able to take her insulin and victoza consistently - she is going to follow up her pharmacy for this issue. Also states that she has a history of an enlarged heart and was seeing cardiology at one point but has not been in a while and is wondering if she should go - needs a referral. She has noticed that her BP has been running a little elevated despite her medication.    Due for diabetic eye exam.     Needs PPD for work.        Past Medical History:   Diagnosis Date   ??? Acid indigestion    ??? Acid reflux    ??? Asthma    ??? CAD (coronary artery disease)    ??? Depression    ??? Diabetes (HCC)    ??? Diabetic neuropathy (HCC)    ??? 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 Enlarged heart    ??? Hypertension    ??? IBS (irritable bowel syndrome)    ??? Interstitial cystitis    ??? Sciatica    ??? Spinal stenosis        Past Surgical History:   Procedure Laterality Date   ??? HX CHOLECYSTECTOMY      2008   ??? HX ORTHOPAEDIC      LEFT KNEE   ??? HX ORTHOPAEDIC      LEFT ARM   ??? HX PARTIAL HYSTERECTOMY  2003    STILL HAVE OVARIES   ??? HX PELVIC LAPAROSCOPY     ??? HX THYROIDECTOMY         Social History     Tobacco Use   ??? Smoking status: Current Every Day Smoker     Packs/day: 0.25   ??? Smokeless tobacco: Never Used   Substance Use Topics   ??? Alcohol use: Yes   ??? Drug use: Yes     Types: Marijuana     Comment: ONCE EVERY 6 MONTHS         Current Outpatient Medications:   ???  Insulin Needles, Disposable, (Pen Needle) 32 gauge x 5/32" ndle, 200 Each by Does Not Apply route daily., Disp: 200 Pen Needle, Rfl: 3  ???  metFORMIN (GLUCOPHAGE) 1,000 mg tablet, Take 1 Tablet by mouth two (2) times daily (with meals) for 90 days., Disp: 180 Tablet, Rfl: 1  ???  levothyroxine (SYNTHROID) 175 mcg tablet, Take 1 Tablet by mouth Daily (before  breakfast) for 90 days. ONCE A DAY 30 MINUTES BEFORE MEALS, Disp: 90 Tablet, Rfl: 1  ???  dilTIAZem ER (CARDIZEM CD) 300 mg capsule, Take 1 Capsule by mouth daily., Disp: 90 Capsule, Rfl: 1  ???  cloNIDine HCL (CATAPRES) 0.3 mg tablet, Take 1 Tablet by mouth two (2) times a day., Disp: 180 Tablet, Rfl: 1  ???  fluconazole (DIFLUCAN) 150 mg tablet, Take 1 tab by mouth X1 dose; my repeat second dose in 72 hours if symptoms persist, Disp: 1 Tablet, Rfl: 0  ???  atorvastatin (Lipitor) 20 mg tablet, Take 1 Tablet by mouth daily for 90 days., Disp: 90 Tablet, Rfl: 1  ???  lancets misc, Check blood sugar 3 times daily, Disp: 300 Each, Rfl: 5  ???  glucose blood VI test strips (OneTouch  Verio test strips) strip, Her machine is Verio flex  3/day, Disp: 300 Strip, Rfl: 5  ???  cyclobenzaprine (FLEXERIL) 10 mg tablet, TAKE 1 TABLET BY MOUTH TWO (2) TIMES DAILY AS NEEDED FOR MUSCLE SPASM(S) FOR UP TO 60 DAYS., Disp: 60 Tablet, Rfl: 0  ???  indapamide (LOZOL) 1.25 mg tablet, Take 1 Tablet by mouth daily., Disp: 30 Tablet, Rfl: 3  ???  labetaloL (NORMODYNE) 200 mg tablet, Take 1 Tablet by mouth two (2) times a day for 90 days. TWICE A DAY, Disp: 180 Tablet, Rfl: 1  ???  liraglutide (VICTOZA) 0.6 mg/0.1 mL (18 mg/3 mL) pnij, Administer 1.8mg  subcutaneously once a day, Disp: 3 mL, Rfl: 3  ???  insulin glargine (Lantus Solostar U-100 Insulin) 100 unit/mL (3 mL) inpn, Inject 10 units subcutaneously every night at bedtime., Disp: 45 mL, Rfl: 3  ???  cloNIDine HCL (CATAPRES) 0.2 mg tablet, Take 1 Tablet by mouth two (2) times a day for 30 days., Disp: 60 Tablet, Rfl: 3  ???  Insulin Needles, Disposable, 30 gauge x 1/3", Inject subcutaneously every night at bedtime., Disp: 1 Package, Rfl: 11  ???  aspirin delayed-release 81 mg tablet, 81 mg., Disp: , Rfl:   ???  glucose blood VI test strips (ASCENSIA AUTODISC VI, ONE TOUCH ULTRA TEST VI) strip, by Does Not Apply route See Admin Instructions., Disp: , Rfl:   ???  irbesartan (AVAPRO) 150 mg tablet, Take 1 Tab by mouth  nightly., Disp: 30 Tab, Rfl: 5  ???  gabapentin (NEURONTIN) 100 mg capsule, Take 1 Capsule by mouth three (3) times daily. Max Daily Amount: 300 mg. (Patient not taking: Reported on 08/18/2020), Disp: 90 Capsule, Rfl: 1  ???  Bydureon 2 mg/0.65 mL pnij, 0.65 mL by abdominal subcutaneous route every seven (7) days for 30 doses. ONCE A WEEK (Patient not taking: Reported on 04/28/2020), Disp: 4 Adjustable Dose Pre-filled Pen Syringe, Rfl: 5  ???  ARIPiprazole (ABILIFY) 5 mg tablet, 5 mg daily. (Patient not taking: Reported on 04/28/2020), Disp: , Rfl:   ???  lamoTRIgine (LaMICtal) 25 mg tablet, 50 mg. oNCE A DAY (Patient not taking: Reported on 08/18/2020), Disp: , Rfl:      Allergies   Allergen Reactions   ??? Vicodin [Hydrocodone-Acetaminophen] Itching   ??? Lisinopril-Hydrochlorothiazide Other (comments)     Dry cough    ??? Pcn [Penicillins] Other (comments)       Review of Systems   Constitutional: Negative for chills, fever and malaise/fatigue.   Eyes: Negative for blurred vision and double vision.   Respiratory: Negative for cough and shortness of breath.    Cardiovascular: Negative for chest pain, palpitations and leg swelling.   Neurological: Negative for dizziness and headaches.         PE  BP 139/82 (BP 1 Location: Left upper arm, BP Patient Position: Sitting, BP Cuff Size: Large adult)    Pulse (!) 106    Temp 97.4 ??F (36.3 ??C) (Temporal)    Resp 16    Ht 5\' 1"  (1.549 m)    Wt 226 lb (102.5 kg)    SpO2 99%    BMI 42.70 kg/m??     Physical Exam  Vitals reviewed.   Constitutional:       General: She is not in acute distress.     Appearance: Normal appearance.   HENT:      Head: Normocephalic and atraumatic.   Cardiovascular:      Rate and Rhythm: Normal rate and regular rhythm.  Heart sounds: S1 normal and S2 normal. No murmur heard.   No friction rub. No gallop. No S3 or S4 sounds.    Pulmonary:      Effort: Pulmonary effort is normal.      Breath sounds: Normal breath sounds. No wheezing, rhonchi or rales.    Musculoskeletal:      Right lower leg: No edema.      Left lower leg: No edema.   Skin:     General: Skin is warm and dry.      Capillary Refill: Capillary refill takes less than 2 seconds.   Neurological:      Mental Status: She is alert and oriented to person, place, and time.         Assessment/Plan  1. T2DM  Encouraged to make eye appointment  Increased quantity of needles to 200/box  Encouraged to FU with pharmacy regarding this issue  A1C 8.3 (up from 7.9)  No medication changes at this time - will reassess once she is able to take her medication consistently    2. HTN  Increase clonidine to 0.3 BID  Recheck 1 month    3. History of enlarged heart  Refer to cardiology for evaluation    4. Screening for tuberculosis  PPD placed with instructions to RTO within 48 hours for reading

## 2020-08-20 ENCOUNTER — Institutional Professional Consult (permissible substitution): Admit: 2020-08-20 | Discharge: 2020-08-23 | Payer: PRIVATE HEALTH INSURANCE | Attending: Family | Primary: Family

## 2020-08-20 DIAGNOSIS — Z111 Encounter for screening for respiratory tuberculosis: Secondary | ICD-10-CM

## 2020-08-20 LAB — AMB POC TUBERCULOSIS, INTRADERMAL (SKIN TEST)
PPD, (POC): NEGATIVE Negative
PPD: NEGATIVE Negative
mm Induration: 0 mm (ref 0–5)
mm Induration: 0 mm (ref 0–5)

## 2020-08-20 NOTE — Progress Notes (Signed)
PPD Reading Note  PPD read and results entered in [employer].  Result: 0 mm induration.  Interpretation: negative  If test not read within 48-72 hours of initial placement, patient advised to repeat in other arm 1-3 weeks after this test.  Allergic reaction: no

## 2020-08-26 ENCOUNTER — Inpatient Hospital Stay: Admit: 2020-08-26 | Payer: PRIVATE HEALTH INSURANCE | Primary: Family

## 2020-08-26 ENCOUNTER — Encounter: Admit: 2020-08-26 | Discharge: 2020-08-26 | Payer: PRIVATE HEALTH INSURANCE | Primary: Family

## 2020-08-26 DIAGNOSIS — E0800 Diabetes mellitus due to underlying condition with hyperosmolarity without nonketotic hyperglycemic-hyperosmolar coma (NKHHC): Secondary | ICD-10-CM

## 2020-08-26 NOTE — Progress Notes (Signed)
Patient informed of results and Nurse visit scheduled with me.

## 2020-08-26 NOTE — Progress Notes (Signed)
Call - urine test for diabetes shows that she is leaking some protein from her kidneys into her urine. Would like to start her on a low dose of a medication called losartan to help correct this. Will send in RX.     Please make sure she is set up for a BP recheck a month from our last visit.

## 2020-08-27 LAB — MICROALBUMIN, UR, RAND W/ MICROALB/CREAT RATIO
Creatinine, urine random: 176 mg/dL — ABNORMAL HIGH (ref 30–125)
Microalbumin,urine random: 1.15 MG/DL (ref 0–3.0)
Microalbumin/Creat ratio (mg/g creat): 7 mg/g (ref 0–30)

## 2020-08-27 LAB — MICROALBUMIN / CREATININE URINE RATIO
Creatinine, Ur: 176 mg/dL — ABNORMAL HIGH (ref 30–125)
Microalb, Ur: 1.15 MG/DL (ref 0–3.0)
Microalbumin Creatinine Ratio: 7 mg/g (ref 0–30)

## 2020-09-01 MED ORDER — LOSARTAN 25 MG TAB
25 mg | ORAL_TABLET | Freq: Every day | ORAL | 1 refills | Status: DC
Start: 2020-09-01 — End: 2020-11-26

## 2020-09-10 ENCOUNTER — Ambulatory Visit: Attending: Cardiovascular Disease | Primary: Family Medicine

## 2020-09-10 ENCOUNTER — Ambulatory Visit
Admit: 2020-09-10 | Discharge: 2020-09-10 | Payer: PRIVATE HEALTH INSURANCE | Attending: Cardiovascular Disease | Primary: Family

## 2020-09-10 DIAGNOSIS — I10 Essential (primary) hypertension: Secondary | ICD-10-CM

## 2020-09-10 MED ORDER — BUPROPION 75 MG TAB
75 mg | ORAL_TABLET | Freq: Two times a day (BID) | ORAL | 5 refills | Status: AC
Start: 2020-09-10 — End: ?

## 2020-09-10 NOTE — Progress Notes (Signed)
Subjective:      Christy [name] is in the office today for cardiac evaluation. She is a 44 year old woman that has been told she had enlarged heart in the past. Recent CT CTA of the chest demonstrated a normal-sized heart. He had a normal nuclear stress test done on 07/02/2018.    The patient reports a several year history of chest pain. It is described as "stabbing "in quality. It is also described as sometimes dull. It can last up to 5 minutes. It generally goes away without intervention. She takes an occasional aspirin. She localizes the discomfort to the left peristernal and inframammary areas. The patient also experiences shortness of breath. She says that one flight of steps will cause her to be very short of breath. She has had no PND or orthopnea.     Patient's cardiac risk factors are smoking/ tobacco exposure, family history, dyslipidemia, diabetes mellitus, hypertension.        Patient Active Problem List    Diagnosis Date Noted   ??? History of nuclear stress test 09/18/2020   ??? Palpitations 09/18/2020   ??? Tobacco abuse 09/18/2020   ??? Family history of premature CAD 09/18/2020   ??? Obesity, morbid (HCC) 10/22/2019   ??? Spinal stenosis    ??? IBS (irritable bowel syndrome)    ??? Chest pain 12/10/2012   ??? Dizziness 12/10/2012   ??? CAD (coronary artery disease) 06/06/2011   ??? Hyperlipidemia with target low density lipoprotein (LDL) cholesterol less than 70 mg/dL 35/46/5681   ??? Hypertension 06/06/2011   ??? Obesity 06/06/2011   ??? DM (diabetes mellitus) (HCC) 06/06/2011   ??? GERD (gastroesophageal reflux disease) 06/06/2011     Current Outpatient Medications   Medication Sig Dispense Refill   ??? losartan (COZAAR) 25 mg tablet Take 1 Tablet by mouth daily. 90 Tablet 1   ??? Insulin Needles, Disposable, (Pen Needle) 32 gauge x 5/32" ndle 200 Each by Does Not Apply route daily. 200 Pen Needle 3   ??? metFORMIN (GLUCOPHAGE) 1,000 mg tablet Take 1 Tablet by mouth two (2) times daily (with meals) for 90 days. 180 Tablet 1   ??? levothyroxine  (SYNTHROID) 175 mcg tablet Take 1 Tablet by mouth Daily (before breakfast) for 90 days. ONCE A DAY 30 MINUTES BEFORE MEALS 90 Tablet 1   ??? dilTIAZem ER (CARDIZEM CD) 300 mg capsule Take 1 Capsule by mouth daily. 90 Capsule 1   ??? cloNIDine HCL (CATAPRES) 0.3 mg tablet Take 1 Tablet by mouth two (2) times a day. 180 Tablet 1   ??? fluconazole (DIFLUCAN) 150 mg tablet Take 1 tab by mouth X1 dose; my repeat second dose in 72 hours if symptoms persist 1 Tablet 0   ??? atorvastatin (Lipitor) 20 mg tablet Take 1 Tablet by mouth daily for 90 days. 90 Tablet 1   ??? lancets misc Check blood sugar 3 times daily 300 Each 5   ??? glucose blood VI test strips (OneTouch Verio test strips) strip Her machine is Verio flex  3/day 300 Strip 5   ??? indapamide (LOZOL) 1.25 mg tablet Take 1 Tablet by mouth daily. 30 Tablet 3   ??? liraglutide (VICTOZA) 0.6 mg/0.1 mL (18 mg/3 mL) pnij Administer 1.8mg  subcutaneously once a day 3 mL 3   ??? insulin glargine (Lantus Solostar U-100 Insulin) 100 unit/mL (3 mL) inpn Inject 10 units subcutaneously every night at bedtime. 45 mL 3   ??? Insulin Needles, Disposable, 30 gauge x 1/3" Inject subcutaneously every night at bedtime. 1 Package  11   ??? aspirin delayed-release 81 mg tablet 81 mg.     ??? lamoTRIgine (LaMICtal) 25 mg tablet 50 mg. oNCE A DAY     ??? glucose blood VI test strips (ASCENSIA AUTODISC VI, ONE TOUCH ULTRA TEST VI) strip by Does Not Apply route See Admin Instructions.     ??? irbesartan (AVAPRO) 150 mg tablet Take 1 Tab by mouth nightly. 30 Tab 5   ??? buPROPion (WELLBUTRIN) 75 mg tablet Take 1 Tablet by mouth two (2) times a day. 60 Tablet 5   ??? cyclobenzaprine (FLEXERIL) 10 mg tablet TAKE 1 TABLET BY MOUTH TWO (2) TIMES DAILY AS NEEDED FOR MUSCLE SPASM(S) FOR UP TO 60 DAYS. 60 Tablet 0   ??? gabapentin (NEURONTIN) 100 mg capsule Take 1 Capsule by mouth three (3) times daily. Max Daily Amount: 300 mg. (Patient not taking: Reported on 08/18/2020) 90 Capsule 1   ??? labetaloL (NORMODYNE) 200 mg tablet Take 1  Tablet by mouth two (2) times a day for 90 days. TWICE A DAY 180 Tablet 1   ??? cloNIDine HCL (CATAPRES) 0.2 mg tablet Take 1 Tablet by mouth two (2) times a day for 30 days. 60 Tablet 3   ??? ARIPiprazole (ABILIFY) 5 mg tablet 5 mg daily. (Patient not taking: Reported on 04/28/2020)       Allergies   Allergen Reactions   ??? Vicodin [Hydrocodone-Acetaminophen] Itching   ??? Lisinopril-Hydrochlorothiazide Other (comments)     Dry cough    ??? Pcn [Penicillins] Other (comments)     Past Medical History:   Diagnosis Date   ??? Acid indigestion    ??? Acid reflux    ??? Asthma    ??? CAD (coronary artery disease)    ??? Depression    ??? Diabetes (HCC)    ??? Diabetic neuropathy (HCC)    ??? 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 Enlarged heart    ??? Hypertension    ??? IBS (irritable bowel syndrome)    ??? Interstitial cystitis    ??? Sciatica    ??? Spinal stenosis      Past Surgical History:   Procedure Laterality Date   ??? HX CHOLECYSTECTOMY      2008   ??? HX ORTHOPAEDIC      LEFT KNEE   ??? HX ORTHOPAEDIC      LEFT ARM   ??? HX PARTIAL HYSTERECTOMY  2003    STILL HAVE OVARIES   ??? HX PELVIC LAPAROSCOPY     ??? HX THYROIDECTOMY       Family History   Problem Relation Age of Onset   ??? Diabetes Mother    ??? Heart Disease Mother    ??? Diabetes Father    ??? Cancer Sister    ??? Heart Disease Maternal Aunt    ??? Cancer Paternal Aunt      Social History     Tobacco Use   Smoking Status Current Every Day Smoker   ??? Packs/day: 0.25   Smokeless Tobacco Never Used          Review of Systems, additional:  Constitutional: negative  Eyes: negative  Respiratory: positive for dyspnea on exertion  Cardiovascular: positive for chest pain, palpitations, dyspnea on exertion  Gastrointestinal: negative  Musculoskeletal:negative  Neurological: negative  Behvioral/Psych: negative  Endocrine: negative  ENT: negative    Objective:     Visit Vitals  BP (!) 134/99 (BP 1 Location: Left lower arm, BP Patient Position:  Sitting, BP Cuff Size: Child)   Pulse (!) 126   Temp 97.3 ??F (36.3  ??C) (Temporal)   Ht 5\' 1"  (1.549 m)   Wt 103.9 kg (229 lb)   SpO2 96%   BMI 43.27 kg/m??     General:  alert, cooperative, no distress   Chest Wall: inspection normal - no chest wall deformities or tenderness, respiratory effort normal   Lung: clear to auscultation bilaterally   Heart:  normal rate and regular rhythm, S1 and S2 normal, no murmurs noted, no gallops noted, no JVD   Abdomen: soft, non-tender. Bowel sounds normal. No masses,  no organomegaly   Extremities: extremities normal, atraumatic, no cyanosis or edema Skin: no rashes   Neuro: alert, oriented, normal speech, no focal findings or movement disorder noted     EKG: 09/10/2020. Sinus tachycardia. Otherwise normal.    Assessment/Plan:       ICD-10-CM ICD-9-CM    1. Primary hypertension, elevated diastolic BP in the office today. I10 401.9 ECHO ADULT COMPLETE      TSH AND FREE T4   2. DOE (dyspnea on exertion)  R06.00 786.09 ECHO ADULT COMPLETE      TSH AND FREE T4      CARDIAC HOLTER MONITOR      AMB POC EKG ROUTINE W/ 12 LEADS, INTER & REP   3. Screening for thyroid disorder  Z13.29 V77.0 TSH AND FREE T4   4. Hyperlipidemia with target low density lipoprotein (LDL) cholesterol less than 70 mg/dL, 13/09/2020. Total cholesterol 168. HDL 58. Triglycerides 115. Patient on Lipitor 20 mg daily. E78.5 272.4    5. Diabetes mellitus due to underlying condition with hyperosmolarity without coma, unspecified whether long term insulin use (HCC)  E08.00 249.20    6. History of nuclear stress test , 07/02/2018, low risk. Z92.89 V15.89    7. Chest pain, unspecified type  R07.9 786.50    8. Palpitations , will order thyroid function tests, echocardiogram and Holter monitor. Return in 3 to 4 weeks. R00.2 785.1    9. Tobacco abuse , Wellbutrin 75 mg twice daily. Z72.0 305.1

## 2020-09-10 NOTE — Progress Notes (Signed)
Christy [name] presents today for   Chief Complaint   Patient presents with   . New Patient     abnormal stress test        Is someone accompanying this pt? no    Is the patient using any DME equipment during OV? no    Depression Screening:  3 most recent PHQ Screens 08/18/2020   Little interest or pleasure in doing things Nearly every day   Feeling down, depressed, irritable, or hopeless Nearly every day   Total Score PHQ 2 6   Trouble falling or staying asleep, or sleeping too much Nearly every day   Feeling tired or having little energy Nearly every day   Poor appetite, weight loss, or overeating Nearly every day   Feeling bad about yourself - or that you are a failure or have let yourself or your family down Nearly every day   Trouble concentrating on things such as school, work, reading, or watching TV More than half the days   Moving or speaking so slowly that other people could have noticed; or the opposite being so fidgety that others notice Not at all   Thoughts of being better off dead, or hurting yourself in some way Nearly every day   PHQ 9 Score 23       Learning Assessment:  No flowsheet data found.    Abuse Screening:  Abuse Screening Questionnaire 08/18/2020   Do you ever feel afraid of your partner? N   Are you in a relationship with someone who physically or mentally threatens you? N   Is it safe for you to go home? Y       Fall Risk  Fall Risk Assessment, last 12 mths 10/22/2019   Able to walk? Yes   Fall in past 12 months? 0   Do you feel unsteady? 1   Are you worried about falling 1       OPIOID RISK TOOL  No flowsheet data found.    Coordination of Care:  1. Have you been to the ER, urgent care clinic since your last visit? no  Hospitalized since your last visit? no    2. Have you seen or consulted any other health care providers outside of the [facility] since your last visit? no Include any pap smears or colon screening. no

## 2020-09-10 NOTE — Telephone Encounter (Signed)
 PCP: Graylin Willma RAMAN, NP    Last appt: 09/10/2020  Future Appointments   Date Time Provider Department Center   09/21/2020 12:45 PM [facility]   09/29/2020  7:30 AM [facility]   10/01/2020  8:30 AM D'Angelo, Willma RAMAN, NP [facility]       Requested Prescriptions     Pending Prescriptions Disp Refills   . buPROPion  (WELLBUTRIN ) 75 mg tablet 60 Tablet 5     Sig: Take 1 Tablet by mouth two (2) times a day.

## 2020-09-21 ENCOUNTER — Inpatient Hospital Stay: Admit: 2020-09-30 | Payer: PRIVATE HEALTH INSURANCE | Attending: Family | Primary: Family

## 2020-09-21 ENCOUNTER — Encounter

## 2020-09-21 DIAGNOSIS — N644 Mastodynia: Secondary | ICD-10-CM

## 2020-09-22 ENCOUNTER — Telehealth: Attending: Family | Primary: Family Medicine

## 2020-09-22 ENCOUNTER — Telehealth: Admit: 2020-09-22 | Discharge: 2020-09-22 | Payer: PRIVATE HEALTH INSURANCE | Attending: Family | Primary: Family

## 2020-09-22 DIAGNOSIS — J1100 Influenza due to unidentified influenza virus with unspecified type of pneumonia: Secondary | ICD-10-CM

## 2020-09-22 MED ORDER — FLUCONAZOLE 150 MG TAB
150 mg | ORAL_TABLET | ORAL | 0 refills | Status: DC
Start: 2020-09-22 — End: 2020-11-30

## 2020-09-22 MED ORDER — BENZONATATE 200 MG CAP
200 mg | ORAL_CAPSULE | Freq: Three times a day (TID) | ORAL | 0 refills | Status: AC | PRN
Start: 2020-09-22 — End: 2020-10-02

## 2020-09-22 NOTE — Progress Notes (Signed)
Christy [name] is a 44 y.o. female who was seen by synchronous (real-time) audio-video technology using [email or web address] on 09/22/2020.      Mr#: [id number]    Consent:  She and/or her healthcare decision maker is aware that this patient-initiated Telehealth encounter is a billable service, with coverage as determined by her insurance carrier. She is aware that she may receive a bill and has provided verbal consent to proceed: YES    I was in the office while conducting this encounter.      712  HPI/Subjective:   Christy [name] is a 44 year old female who presents today for follow up after being seen at urgent care yesterday. She has been sick for 3 weeks now with cough and upper respiratory infection. Went to the ER one week ago and given an albuterol inhaler and prednisone. Still feeling bad so yesterday she went to urgent care. COVID and flu tests both negative. Chest xray showed possible pneumonia. She was given doxycycline and flovent. Told to FU with her PCP so she wants to know if there is anything else she should be doing.          Patient Active Problem List   Diagnosis Code   ??? CAD (coronary artery disease) I25.10   ??? Hyperlipidemia with target low density lipoprotein (LDL) cholesterol less than 70 mg/dL V94.8   ??? Hypertension I10   ??? Obesity E66.9   ??? DM (diabetes mellitus) (HCC) E11.9   ??? GERD (gastroesophageal reflux disease) K21.9   ??? Chest pain R07.9   ??? Dizziness R42   ??? Obesity, morbid (HCC) E66.01   ??? Spinal stenosis M48.00   ??? IBS (irritable bowel syndrome) K58.9   ??? History of nuclear stress test Z92.89   ??? Palpitations R00.2   ??? Tobacco abuse Z72.0   ??? Family history of premature CAD Z82.49            Allergies   Allergen Reactions   ??? Vicodin [Hydrocodone-Acetaminophen] Itching   ??? Lisinopril-Hydrochlorothiazide Other (comments)     Dry cough    ??? Pcn [Penicillins] Other (comments)         Current Outpatient Medications:   ???  benzonatate (TESSALON) 200 mg capsule, Take 1 Capsule by mouth three (3) times  daily as needed for Cough for up to 10 days., Disp: 30 Capsule, Rfl: 0  ???  fluconazole (DIFLUCAN) 150 mg tablet, Take 1 tab by mouth X1 dose. May repeat second dose in 72 hours if symptoms persist., Disp: 2 Tablet, Rfl: 0  ???  buPROPion (WELLBUTRIN) 75 mg tablet, Take 1 Tablet by mouth two (2) times a day., Disp: 60 Tablet, Rfl: 5  ???  losartan (COZAAR) 25 mg tablet, Take 1 Tablet by mouth daily., Disp: 90 Tablet, Rfl: 1  ???  Insulin Needles, Disposable, (Pen Needle) 32 gauge x 5/32" ndle, 200 Each by Does Not Apply route daily., Disp: 200 Pen Needle, Rfl: 3  ???  metFORMIN (GLUCOPHAGE) 1,000 mg tablet, Take 1 Tablet by mouth two (2) times daily (with meals) for 90 days., Disp: 180 Tablet, Rfl: 1  ???  levothyroxine (SYNTHROID) 175 mcg tablet, Take 1 Tablet by mouth Daily (before breakfast) for 90 days. ONCE A DAY 30 MINUTES BEFORE MEALS, Disp: 90 Tablet, Rfl: 1  ???  dilTIAZem ER (CARDIZEM CD) 300 mg capsule, Take 1 Capsule by mouth daily., Disp: 90 Capsule, Rfl: 1  ???  cloNIDine HCL (CATAPRES) 0.3 mg tablet, Take 1 Tablet by mouth two (2)  times a day., Disp: 180 Tablet, Rfl: 1  ???  fluconazole (DIFLUCAN) 150 mg tablet, Take 1 tab by mouth X1 dose; my repeat second dose in 72 hours if symptoms persist, Disp: 1 Tablet, Rfl: 0  ???  atorvastatin (Lipitor) 20 mg tablet, Take 1 Tablet by mouth daily for 90 days., Disp: 90 Tablet, Rfl: 1  ???  lancets misc, Check blood sugar 3 times daily, Disp: 300 Each, Rfl: 5  ???  glucose blood VI test strips (OneTouch Verio test strips) strip, Her machine is Verio flex  3/day, Disp: 300 Strip, Rfl: 5  ???  cyclobenzaprine (FLEXERIL) 10 mg tablet, TAKE 1 TABLET BY MOUTH TWO (2) TIMES DAILY AS NEEDED FOR MUSCLE SPASM(S) FOR UP TO 60 DAYS., Disp: 60 Tablet, Rfl: 0  ???  gabapentin (NEURONTIN) 100 mg capsule, Take 1 Capsule by mouth three (3) times daily. Max Daily Amount: 300 mg. (Patient not taking: Reported on 08/18/2020), Disp: 90 Capsule, Rfl: 1  ???  indapamide (LOZOL) 1.25 mg tablet, Take 1 Tablet by  mouth daily., Disp: 30 Tablet, Rfl: 3  ???  labetaloL (NORMODYNE) 200 mg tablet, Take 1 Tablet by mouth two (2) times a day for 90 days. TWICE A DAY, Disp: 180 Tablet, Rfl: 1  ???  liraglutide (VICTOZA) 0.6 mg/0.1 mL (18 mg/3 mL) pnij, Administer 1.8mg  subcutaneously once a day, Disp: 3 mL, Rfl: 3  ???  insulin glargine (Lantus Solostar U-100 Insulin) 100 unit/mL (3 mL) inpn, Inject 10 units subcutaneously every night at bedtime., Disp: 45 mL, Rfl: 3  ???  cloNIDine HCL (CATAPRES) 0.2 mg tablet, Take 1 Tablet by mouth two (2) times a day for 30 days., Disp: 60 Tablet, Rfl: 3  ???  Insulin Needles, Disposable, 30 gauge x 1/3", Inject subcutaneously every night at bedtime., Disp: 1 Package, Rfl: 11  ???  ARIPiprazole (ABILIFY) 5 mg tablet, 5 mg daily. (Patient not taking: Reported on 04/28/2020), Disp: , Rfl:   ???  aspirin delayed-release 81 mg tablet, 81 mg., Disp: , Rfl:   ???  lamoTRIgine (LaMICtal) 25 mg tablet, 50 mg. oNCE A DAY, Disp: , Rfl:   ???  glucose blood VI test strips (ASCENSIA AUTODISC VI, ONE TOUCH ULTRA TEST VI) strip, by Does Not Apply route See Admin Instructions., Disp: , Rfl:   ???  irbesartan (AVAPRO) 150 mg tablet, Take 1 Tab by mouth nightly., Disp: 30 Tab, Rfl: 5      Review of Systems   Constitutional: Negative for chills, fever and malaise/fatigue.   HENT: Positive for congestion. Negative for sinus pain and sore throat.    Respiratory: Positive for cough and shortness of breath. Negative for sputum production and wheezing.    Cardiovascular: Negative for chest pain, palpitations and leg swelling.   Neurological: Negative for dizziness and headaches.         PHYSICAL EXAMINATION:    Constitutional: [x]  Appears well-developed and well-nourished [x]  No apparent distress        Mental status: [x]  Alert and awake  [x]  Oriented t [x]  Able to follow commands      Eyes:   EOM    [x]   Normal        HENT: [x]  Normocephalic,      Pulmonary/Chest: [x]  Respiratory effort normal   [x]  No visualized signs of difficulty  breathing or respiratory distress           Musculoskeletal:   [x]  No obvious deformities noted with regard to areas viewed  Neurological:        [x]  No apparent neurologic deficits noted in areas viewed                 Skin:        [x]  No apparent dermatologic abnormalities noted in areas viewed               Psychiatric:       [x]  Normal Affect      Other pertinent observable physical exam findings:-None noted          Assessment & Plan:   Diagnoses and all orders for this visit:    1. Influenza and pneumonia  -     XR CHEST PA LAT; Future    Other orders  -     benzonatate (TESSALON) 200 mg capsule; Take 1 Capsule by mouth three (3) times daily as needed for Cough for up to 10 days.  -     fluconazole (DIFLUCAN) 150 mg tablet; Take 1 tab by mouth X1 dose. May repeat second dose in 72 hours if symptoms persist.       CXR 1 month for FU    I advised her to contact the office if her condition worsens, changes, fails to improve as anticipated and with any new problems. She expressed understanding with the diagnosis(es) and plan.          This service was provided throughTelehealth, the patient is at home and the provider is at [facility] and , LPN/    Pursuant to the emergency declaration under the Act and the [facility], 1135 waiver authority and the Coronavirus Preparedness and [place] Act, this Virtual  Visit was conducted, with patient's consent, to reduce the patient's risk of exposure to COVID-19 and provide continuity of care for an established patient.     Services were provided through a video synchronous discussion virtually to substitute for in-person clinic visit.    Scotty Court, NP         PLEASE NOTE:   This document has been produced using voice recognition software. Unrecognized errors in transcription may be present.

## 2020-09-27 ENCOUNTER — Encounter

## 2020-09-29 ENCOUNTER — Encounter: Primary: Family

## 2020-09-30 ENCOUNTER — Inpatient Hospital Stay: Payer: PRIVATE HEALTH INSURANCE | Primary: Family

## 2020-09-30 ENCOUNTER — Inpatient Hospital Stay: Admit: 2020-09-30 | Payer: PRIVATE HEALTH INSURANCE | Attending: Family | Primary: Family

## 2020-09-30 DIAGNOSIS — N644 Mastodynia: Secondary | ICD-10-CM

## 2020-10-01 ENCOUNTER — Institutional Professional Consult (permissible substitution): Admit: 2020-10-01 | Discharge: 2020-10-01 | Payer: PRIVATE HEALTH INSURANCE | Attending: Family | Primary: Family

## 2020-10-01 DIAGNOSIS — Z0130 Encounter for examination of blood pressure without abnormal findings: Secondary | ICD-10-CM

## 2020-10-01 NOTE — Telephone Encounter (Signed)
Patient is under the care of Dr Duanne Moron. Next appt scheduled for 10/18/2020.

## 2020-10-01 NOTE — Progress Notes (Signed)
Noted  

## 2020-10-01 NOTE — Telephone Encounter (Signed)
NP D'Angelo's office requests to know if patient's HTN will be followed by Dr Allena Katz.

## 2020-10-01 NOTE — Progress Notes (Signed)
Patient stated that he mentioned it was high, but didn't let her know anything else.  I called Dr. Eliane Decree office and Lorin Picket put a message in with his nurse to give me a call back.

## 2020-10-01 NOTE — Telephone Encounter (Signed)
Patient has an appointment on December 7th  Future Appointments   Date Time Provider Department Center   10/05/2020  8:20 AM [facility] AMB   10/18/2020 11:30 AM Merdis Delay, MD [facility] AMB

## 2020-10-01 NOTE — Progress Notes (Signed)
Patient came in today for a blood pressure checked.  It was checked twice by machine and manual. Patient mentioned that she did not take her Losartan medication this morning.

## 2020-10-01 NOTE — Progress Notes (Signed)
According to [facility], patient is under the Care of Dr. Duanne Moron.

## 2020-10-01 NOTE — Progress Notes (Signed)
Her BP is still elevated today. I see that she recently saw cardiology. Will he be managing her BP going forward?

## 2020-10-01 NOTE — Progress Notes (Signed)
Great, thank you.

## 2020-10-05 ENCOUNTER — Encounter

## 2020-10-05 ENCOUNTER — Inpatient Hospital Stay: Admit: 2020-10-05 | Payer: PRIVATE HEALTH INSURANCE | Primary: Family

## 2020-10-05 ENCOUNTER — Encounter: Admit: 2020-10-05 | Discharge: 2020-10-05 | Payer: PRIVATE HEALTH INSURANCE | Primary: Family

## 2020-10-05 DIAGNOSIS — E039 Hypothyroidism, unspecified: Secondary | ICD-10-CM

## 2020-10-05 LAB — TSH AND FREE T4
T4, Free: 1.1 NG/DL (ref 0.7–1.5)
TSH: 6.02 u[IU]/mL — ABNORMAL HIGH (ref 0.36–3.74)

## 2020-10-05 LAB — TSH + FREE T4 PANEL
T4 Free: 1.1 NG/DL (ref 0.7–1.5)
TSH: 6.02 u[IU]/mL — ABNORMAL HIGH (ref 0.36–3.74)

## 2020-10-05 NOTE — Progress Notes (Signed)
Spoke with patient and she stated that she hasn't had a [place], but her Thyroid has been removed.  She has been advised that her Synthroid is now 200 mcg.

## 2020-10-05 NOTE — Progress Notes (Signed)
Call - TSH is elevated so I am going to increase her thyroid medication. Has she ever had a thyroid US?

## 2020-10-06 MED ORDER — LEVOTHYROXINE 200 MCG TAB
200 mcg | ORAL_TABLET | Freq: Every day | ORAL | 2 refills | Status: DC
Start: 2020-10-06 — End: 2021-10-19

## 2020-10-11 MED ORDER — INDAPAMIDE 1.25 MG TAB
1.25 mg | ORAL_TABLET | ORAL | 3 refills | Status: DC
Start: 2020-10-11 — End: 2021-03-24

## 2020-10-12 MED ORDER — INSULIN NEEDLES (DISPOSABLE) 31 X 5/16"
31 gauge x 5/16" | 11 refills | Status: DC
Start: 2020-10-12 — End: 2021-03-16

## 2020-10-12 NOTE — Telephone Encounter (Signed)
[facility] need a script stating that reflect patient using 2 pen needles a day. They stated from their notes that patient uses 2 pen needles.  Please assist, thank you.

## 2020-10-18 ENCOUNTER — Encounter: Attending: Cardiovascular Disease | Primary: Family

## 2020-10-20 ENCOUNTER — Encounter

## 2020-10-20 NOTE — Telephone Encounter (Signed)
completed

## 2020-10-20 NOTE — Telephone Encounter (Signed)
I made an echo appt for this patient on 1/18 @ 3. Could you put in a clinic performed order for it when you get the chance

## 2020-11-16 ENCOUNTER — Encounter: Primary: Family

## 2020-11-16 ENCOUNTER — Ambulatory Visit: Payer: PRIVATE HEALTH INSURANCE | Primary: Family

## 2020-11-24 ENCOUNTER — Ambulatory Visit

## 2020-11-24 ENCOUNTER — Ambulatory Visit: Admit: 2020-11-24 | Payer: PRIVATE HEALTH INSURANCE | Primary: Family

## 2020-11-24 DIAGNOSIS — R0600 Dyspnea, unspecified: Secondary | ICD-10-CM

## 2020-11-26 ENCOUNTER — Ambulatory Visit: Attending: Cardiovascular Disease | Primary: Family Medicine

## 2020-11-26 ENCOUNTER — Ambulatory Visit
Admit: 2020-11-26 | Discharge: 2020-11-26 | Payer: PRIVATE HEALTH INSURANCE | Attending: Cardiovascular Disease | Primary: Family

## 2020-11-26 DIAGNOSIS — I10 Essential (primary) hypertension: Secondary | ICD-10-CM

## 2020-11-26 NOTE — Telephone Encounter (Signed)
 PCP: Graylin Willma RAMAN, NP    Last appt: 11/26/2020  No future appointments.    Requested Prescriptions     Pending Prescriptions Disp Refills   . olmesartan  (BENICAR ) 40 mg tablet 30 Tablet 5     Sig: Take 1 Tablet by mouth daily.

## 2020-11-26 NOTE — Progress Notes (Signed)
Subjective:      Christy [name] is in the office today for cardiac evaluation. She is a 45 year old woman that has been told she had enlarged heart in the past. Recent CT CTA of the chest demonstrated a normal-sized heart.  She had a normal nuclear stress test done on 07/02/2018.    The patient reported a several year history of chest pain. It was described as "stabbing "in quality. It was also described as sometimes dull. It could last up to 5 minutes. It generally goes away without intervention. She takes an occasional aspirin. She localizes the discomfort to the left peristernal and inframammary areas. The patient also experiences shortness of breath. She says that one flight of steps will cause her to be very short of breath. She has had no PND or orthopnea.    In the office today, she reports she is feeling "okay ".  She did have a ED visit in the several weeks.  She then presented with fatigue, malaise and nausea.  He tested negative for Covid.     Patient's cardiac risk factors are smoking/ tobacco exposure, family history, dyslipidemia, diabetes mellitus, hypertension.        Patient Active Problem List    Diagnosis Date Noted   ??? History of nuclear stress test 09/18/2020   ??? Palpitations 09/18/2020   ??? Tobacco abuse 09/18/2020   ??? Family history of premature CAD 09/18/2020   ??? Obesity, morbid (HCC) 10/22/2019   ??? Spinal stenosis    ??? IBS (irritable bowel syndrome)    ??? Chest pain 12/10/2012   ??? Dizziness 12/10/2012   ??? CAD (coronary artery disease) 06/06/2011   ??? Hyperlipidemia with target low density lipoprotein (LDL) cholesterol less than 70 mg/dL 71/69/6789   ??? Hypertension 06/06/2011   ??? Obesity 06/06/2011   ??? DM (diabetes mellitus) (HCC) 06/06/2011   ??? GERD (gastroesophageal reflux disease) 06/06/2011     Current Outpatient Medications   Medication Sig Dispense Refill   ??? amLODIPine (NORVASC) 5 mg tablet Take 5 mg by mouth daily. (Patient not taking: Reported on 11/30/2020)     ??? SITagliptin (Januvia) 100 mg  tablet Take 100 mg by mouth daily.     ??? Insulin Needles, Disposable, 31 gauge x 5/16" ndle Use twice daily for insulin and victoza injections. 100 Each 11   ??? indapamide (LOZOL) 1.25 mg tablet TAKE 1 TABLET BY MOUTH EVERY DAY 30 Tablet 3   ??? levothyroxine (SYNTHROID) 200 mcg tablet Take 1 Tablet by mouth Daily (before breakfast). 30 Tablet 2   ??? buPROPion (WELLBUTRIN) 75 mg tablet Take 1 Tablet by mouth two (2) times a day. 60 Tablet 5   ??? Insulin Needles, Disposable, (Pen Needle) 32 gauge x 5/32" ndle 200 Each by Does Not Apply route daily. 200 Pen Needle 3   ??? dilTIAZem ER (CARDIZEM CD) 300 mg capsule Take 1 Capsule by mouth daily. 90 Capsule 1   ??? cloNIDine HCL (CATAPRES) 0.3 mg tablet Take 1 Tablet by mouth two (2) times a day. 180 Tablet 1   ??? lancets misc Check blood sugar 3 times daily 300 Each 5   ??? glucose blood VI test strips (OneTouch Verio test strips) strip Her machine is Verio flex  3/day 300 Strip 5   ??? gabapentin (NEURONTIN) 100 mg capsule Take 1 Capsule by mouth three (3) times daily. Max Daily Amount: 300 mg. 90 Capsule 1   ??? liraglutide (VICTOZA) 0.6 mg/0.1 mL (18 mg/3 mL) pnij Administer 1.8mg  subcutaneously once  a day 3 mL 3   ??? insulin glargine (Lantus Solostar U-100 Insulin) 100 unit/mL (3 mL) inpn Inject 10 units subcutaneously every night at bedtime. 45 mL 3   ??? Insulin Needles, Disposable, 30 gauge x 1/3" Inject subcutaneously every night at bedtime. 1 Package 11   ??? aspirin delayed-release 81 mg tablet 81 mg.     ??? lamoTRIgine (LaMICtal) 25 mg tablet 50 mg. oNCE A DAY     ??? glucose blood VI test strips (ASCENSIA AUTODISC VI, ONE TOUCH ULTRA TEST VI) strip by Does Not Apply route See Admin Instructions.     ??? methocarbamoL (ROBAXIN) 750 mg tablet Take 1 Tablet by mouth four (4) times daily as needed for Muscle Spasm(s) or Pain. 50 Tablet 0   ??? celecoxib (CELEBREX) 200 mg capsule Take 1 Capsule by mouth two (2) times daily as needed for Pain. 30 Capsule 0   ??? olmesartan (BENICAR) 40 mg  tablet Take 1 Tablet by mouth daily. 30 Tablet 5   ??? metFORMIN (GLUCOPHAGE) 1,000 mg tablet Take 1 Tablet by mouth two (2) times daily (with meals) for 90 days. 180 Tablet 1   ??? levothyroxine (SYNTHROID) 175 mcg tablet Take 1 Tablet by mouth Daily (before breakfast) for 90 days. ONCE A DAY 30 MINUTES BEFORE MEALS 90 Tablet 1   ??? atorvastatin (Lipitor) 20 mg tablet Take 1 Tablet by mouth daily for 90 days. 90 Tablet 1   ??? labetaloL (NORMODYNE) 200 mg tablet Take 1 Tablet by mouth two (2) times a day for 90 days. TWICE A DAY (Patient not taking: Reported on 11/30/2020) 180 Tablet 1   ??? cloNIDine HCL (CATAPRES) 0.2 mg tablet Take 1 Tablet by mouth two (2) times a day for 30 days. 60 Tablet 3   ??? ARIPiprazole (ABILIFY) 5 mg tablet 5 mg daily. (Patient not taking: Reported on 04/28/2020)     ??? irbesartan (AVAPRO) 150 mg tablet Take 1 Tab by mouth nightly. (Patient not taking: Reported on 11/30/2020) 30 Tab 5     Allergies   Allergen Reactions   ??? Vicodin [Hydrocodone-Acetaminophen] Itching   ??? Lisinopril-Hydrochlorothiazide Other (comments)     Dry cough    ??? Pcn [Penicillins] Other (comments)     Past Medical History:   Diagnosis Date   ??? Acid indigestion    ??? Acid reflux    ??? Asthma    ??? CAD (coronary artery disease)    ??? Depression    ??? Diabetes (HCC)    ??? Diabetic neuropathy (HCC)    ??? 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 Enlarged heart    ??? Hypertension    ??? IBS (irritable bowel syndrome)    ??? Interstitial cystitis    ??? Sciatica    ??? Spinal stenosis      Past Surgical History:   Procedure Laterality Date   ??? HX CHOLECYSTECTOMY      2008   ??? HX ORTHOPAEDIC      LEFT KNEE   ??? HX ORTHOPAEDIC      LEFT ARM   ??? HX PARTIAL HYSTERECTOMY  2003    STILL HAVE OVARIES   ??? HX PELVIC LAPAROSCOPY     ??? HX THYROIDECTOMY       Family History   Problem Relation Age of Onset   ??? Diabetes Mother    ??? Heart Disease Mother    ??? Diabetes Father    ??? Cancer Sister    ???  Heart Disease Maternal Aunt    ??? Cancer Paternal Aunt       Social History     Tobacco Use   Smoking Status Current Every Day Smoker   ??? Packs/day: 0.25   Smokeless Tobacco Never Used          Review of Systems, additional:  Constitutional: negative  Eyes: negative  Respiratory: positive for dyspnea on exertion  Cardiovascular: positive for chest pain, palpitations, dyspnea on exertion  Gastrointestinal: negative  Musculoskeletal:negative  Neurological: negative  Behvioral/Psych: negative  Endocrine: negative  ENT: negative    Objective:     Visit Vitals  BP (!) 155/109   Pulse (!) 116   Temp 97.2 ??F (36.2 ??C) (Temporal)   Resp 18   Wt 102.5 kg (226 lb)   SpO2 97%   BMI 42.70 kg/m??     General:  alert, cooperative, no distress   Chest Wall: inspection normal - no chest wall deformities or tenderness, respiratory effort normal   Lung: clear to auscultation bilaterally   Heart:  normal rate and regular rhythm, S1 and S2 normal, no murmurs noted, no gallops noted, no JVD   Abdomen: soft, non-tender. Bowel sounds normal. No masses,  no organomegaly   Extremities: extremities normal, atraumatic, no cyanosis or edema Skin: no rashes   Neuro: alert, oriented, normal speech, no focal findings or movement disorder noted     EKG: 09/10/2020. Sinus tachycardia. Otherwise normal.    Assessment/Plan:       ICD-10-CM ICD-9-CM    1. Primary hypertension, severely elevated  BP in the office today.  Asymptomatic.  Will discontinue losartan and start Benicar 40 mg daily.  Will substitute carvedilol for labetalol at the next visit.  Return for a BP check in 2 weeks and an appointment in 1 month I10 401.9 ECHO ADULT COMPLETE      TSH AND FREE T4   2. DOE (dyspnea on exertion)  R06.00 786.09 ECHO ADULT COMPLETE      TSH AND FREE T4      CARDIAC HOLTER MONITOR      AMB POC EKG ROUTINE W/ 12 LEADS, INTER & REP   3. Screening for thyroid disorder  Z13.29 V77.0 TSH AND FREE T4   4. Hyperlipidemia with target low density lipoprotein (LDL) cholesterol less than 70 mg/dL, 62/95/2841. Total  cholesterol 168. HDL 58. Triglycerides 115. Patient on Lipitor 20 mg daily. E78.5 272.4    5. Diabetes mellitus due to underlying condition with hyperosmolarity without coma, unspecified whether long term insulin use (HCC)  E08.00 249.20    6. History of nuclear stress test , 07/02/2018, low risk. Z92.89 V15.89    7. Chest pain, unspecified type  R07.9 786.50    8. Palpitations ,  echocardiogram completed on 11/24/2020.  EF 55 to 60%.  Indeterminate diastolic function.  Moderate concentric LVH.  R00.2 785.1    9. Tobacco abuse , Wellbutrin 75 mg twice daily. Z72.0 305.1

## 2020-11-26 NOTE — Progress Notes (Signed)
Identified pt with two pt identifiers(name and DOB). Reviewed record in preparation for visit and have obtained necessary documentation.    Christy [name] presents today for   Echo cardiac results            Christy [name] preferred language for health care discussion is english/other.    Personal Protective Equipment:   Engineer, maintenance was used including: mask-surgical and hands-gloves. Patient was placed on no precaution(s). Patient was masked.    Precautions:   Patient currently on None  Patient currently roomed with door closed.    Is someone accompanying this pt? no    Is the patient using any DME equipment during OV? no    Depression Screening:  3 most recent PHQ Screens 08/18/2020   Little interest or pleasure in doing things Nearly every day   Feeling down, depressed, irritable, or hopeless Nearly every day   Total Score PHQ 2 6   Trouble falling or staying asleep, or sleeping too much Nearly every day   Feeling tired or having little energy Nearly every day   Poor appetite, weight loss, or overeating Nearly every day   Feeling bad about yourself - or that you are a failure or have let yourself or your family down Nearly every day   Trouble concentrating on things such as school, work, reading, or watching TV More than half the days   Moving or speaking so slowly that other people could have noticed; or the opposite being so fidgety that others notice Not at all   Thoughts of being better off dead, or hurting yourself in some way Nearly every day   PHQ 9 Score 23       Learning Assessment:  No flowsheet data found.    Abuse Screening:  Abuse Screening Questionnaire 08/18/2020   Do you ever feel afraid of your partner? N   Are you in a relationship with someone who physically or mentally threatens you? N   Is it safe for you to go home? Y       Fall Risk  Fall Risk Assessment, last 12 mths 10/22/2019   Able to walk? Yes   Fall in past 12 months? 0   Do you feel unsteady? 1   Are you worried  about falling 1       Pt currently taking Anticoagulant therapy? no  Pt currently taking Antiplatelet therapy? no    Coordination of Care:  1. Have you been to the ER, urgent care clinic since your last visit? Hospitalized since your last visit? no    2. Have you seen or consulted any other health care providers outside of the [facility] since your last visit? Include any pap smears or colon screening. no      Please see Red banners under Allergies and Med Rec to remove outside inquires. All correct information has been verified with patient and added to chart.     Medication's patient's would liked removed has been marked not taking to be removed per Verbal order and read back per Merdis Delay, MD

## 2020-11-27 MED ORDER — OLMESARTAN 40 MG TAB
40 mg | ORAL_TABLET | Freq: Every day | ORAL | 5 refills | Status: DC
Start: 2020-11-27 — End: 2021-05-31

## 2020-11-30 ENCOUNTER — Ambulatory Visit: Attending: Internal Medicine | Primary: Family Medicine

## 2020-11-30 ENCOUNTER — Ambulatory Visit
Admit: 2020-11-30 | Discharge: 2020-11-30 | Payer: PRIVATE HEALTH INSURANCE | Attending: Internal Medicine | Primary: Family

## 2020-11-30 ENCOUNTER — Encounter: Admit: 2020-11-30 | Discharge: 2020-11-30 | Primary: Family

## 2020-11-30 ENCOUNTER — Encounter: Admit: 2020-11-30 | Discharge: 2020-11-30 | Payer: PRIVATE HEALTH INSURANCE | Primary: Family

## 2020-11-30 DIAGNOSIS — R0789 Other chest pain: Secondary | ICD-10-CM

## 2020-11-30 MED ORDER — METHOCARBAMOL 750 MG TAB
750 mg | ORAL_TABLET | Freq: Four times a day (QID) | ORAL | 0 refills | Status: AC | PRN
Start: 2020-11-30 — End: ?

## 2020-11-30 MED ORDER — CELECOXIB 200 MG CAP
200 mg | ORAL_CAPSULE | Freq: Two times a day (BID) | ORAL | 0 refills | Status: DC | PRN
Start: 2020-11-30 — End: 2021-03-15

## 2020-11-30 NOTE — Telephone Encounter (Signed)
Patient has an appointment with Dr. Alphonzo Severance today

## 2020-11-30 NOTE — Progress Notes (Signed)
Patient informed of results. She stated that she's still in pain. The methocarbamol is not work and neither is the celecoxib 200 mg. Is there anything else she can do for the pain? Please assist, thank you.

## 2020-11-30 NOTE — Progress Notes (Signed)
Pt stated she already tried lidocaine patches and tylenol in the past and she doesn't think they work for her pain. Asked pt if she would try to [employer] and she said she didn't have the money for it. Also stated her pain has radiated to the front left side of her breast. Any other alternatives ?

## 2020-11-30 NOTE — Progress Notes (Signed)
HISTORY OF PRESENT ILLNESS  Christy [name] is a 45 y.o. female.  HPI  C/o left side back pain, started 2 weeks ago, constant, 10/10,no known trauma, no rash, no fall, no cough or wheezing  Followed by Dr Duanne Moron for her HTN, her meds were recently adjusted  Review of Systems   Constitutional: Negative for fever.   Respiratory: Negative for cough and wheezing.    Cardiovascular: Negative for orthopnea.   Skin: Negative for rash.       Physical Exam  Vitals reviewed.   Cardiovascular:      Rate and Rhythm: Normal rate.      Heart sounds: No murmur heard.      Pulmonary:      Effort: Pulmonary effort is normal. No respiratory distress.      Breath sounds: Normal breath sounds. No wheezing or rales.   Chest:      Chest wall: Tenderness (left lower posterior chest wall tenderness) present.   Abdominal:      Tenderness: There is no right CVA tenderness or left CVA tenderness.   Skin:     Findings: No rash.   Neurological:      Mental Status: She is oriented to person, place, and time.         ASSESSMENT and PLAN  Diagnoses and all orders for this visit:    1. Left-sided chest wall pain  -     methocarbamoL (ROBAXIN) 750 mg tablet; Take 1 Tablet by mouth four (4) times daily as needed for Muscle Spasm(s) or Pain.  -     XR RIBS LT W PA CXR MIN 3 V; Future  -     celecoxib (CELEBREX) 200 mg capsule; Take 1 Capsule by mouth two (2) times daily as needed for Pain.      Follow-up and Dispositions    ?? Return if symptoms worsen or fail to improve.

## 2020-11-30 NOTE — Progress Notes (Signed)
Spoke to patient and she has an appointment with Dr. Lutricia Horsfall on the 7th, She going to see [facility] or the ER for the pain under her left breast and back.

## 2020-11-30 NOTE — Telephone Encounter (Signed)
 Received call from [place] at [facility] with [employer] Complaint.    Subjective: Caller states It hurts so bad and I've tried everything to get it go away, lidocaine, gabapentin . Nothing makes it goes away. 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 A while ago I was spilling protein and sugar in my urine.     Current Symptoms: cloudy urine, flank pain. Lump in L lower back, long cigar like goes across the lower back    Onset: about two weeks ago    Pain Severity: severe    Temperature: no fever    What has been tried: gabapentin , lidocaine patches    LMP: NA Pregnant: NA    Recommended disposition: be seen by Provider today    Care advice provided, patient verbalizes understanding; denies any other questions or concerns; instructed to call back for any new or worsening symptoms.    Go to provider's office this morning or go to [place]    Attention Provider:  Thank you for allowing me to participate in the care of your patient.  The patient was connected to triage in response to information provided to the ECC.  Please do not respond through this encounter as the response is not directed to a shared pool.      Reason for Disposition  . SEVERE back pain (e.g., excruciating, unable to do any normal activities) and not improved after pain medicine and CARE ADVICE    Protocols used: BACK PAIN-ADULT-[place]

## 2020-11-30 NOTE — Progress Notes (Signed)
 Christy [name] is a 45 y.o. female (DOB: 1976/10/18) presenting to address:    Chief Complaint   Patient presents with   . LOW BACK PAIN     left side pain       Vitals:    11/30/20 1301   BP: (!) 165/125   Pulse: (!) 118   Resp: 16   Temp: 98 F (36.7 C)   TempSrc: Temporal   SpO2: 99%   Weight: 222 lb (100.7 kg)   Height: 5' 1 (1.549 m)   PainSc:  10 - Worst pain ever       Hearing/Vision:   No exam data present    Learning Assessment:   No flowsheet data found.  Depression Screening:     3 most recent PHQ Screens 11/30/2020   Little interest or pleasure in doing things Not at all   Feeling down, depressed, irritable, or hopeless Not at all   Total Score PHQ 2 0   Trouble falling or staying asleep, or sleeping too much -   Feeling tired or having little energy -   Poor appetite, weight loss, or overeating -   Feeling bad about yourself - or that you are a failure or have let yourself or your family down -   Trouble concentrating on things such as school, work, reading, or watching TV -   Moving or speaking so slowly that other people could have noticed; or the opposite being so fidgety that others notice -   Thoughts of being better off dead, or hurting yourself in some way -   PHQ 9 Score -     Fall Risk Assessment:     Fall Risk Assessment, last 12 mths 10/22/2019   Able to walk? Yes   Fall in past 12 months? 0   Do you feel unsteady? 1   Are you worried about falling 1     Abuse Screening:     Abuse Screening Questionnaire 08/18/2020   Do you ever feel afraid of your partner? N   Are you in a relationship with someone who physically or mentally threatens you? N   Is it safe for you to go home? Y     ADL Assessment:   No flowsheet data found.     Coordination of Care Questionaire:   1. Have you been to the ER, urgent care clinic since your last visit?  Hospitalized since your last visit? No    2. Have you seen or consulted any other health care providers outside of the [facility] since your last  visit? No     3. For patients aged 12-75: Has the patient had a colonoscopy? NA - based on age     If the patient is female:    4. For patients aged 76-74: Has the patient had a mammogram within the past 2 years? Yes - Care Gap present. Rooming MA/LPN to request most recent results    5. For patients aged 21-65: Has the patient had a pap smear? No    Advanced Directive:   1. Do you have an Advanced Directive? NO    2. Would you like information on Advanced Directives? NO

## 2020-11-30 NOTE — Progress Notes (Signed)
She can take Tylenol, also she can use Salonpas OTC if needed

## 2020-11-30 NOTE — Progress Notes (Signed)
Normal chest and ribs xray.

## 2020-12-02 LAB — ECHO ADULT COMPLETE
Ao Root Index: 1.4 cm/m2
Aortic Arch: 2.9 cm
Aortic Root: 2.8 cm
Ascending Aorta Index: 1.55 cm/m2
Ascending Aorta: 3.1 cm
EF BP: 53 % — AB (ref 55–100)
EF BP: 53 % — AB (ref 55–100)
Fractional Shortening 2D: 12 % (ref 28–44)
IVSd: 1.6 cm — AB (ref 0.6–0.9)
LA Diameter: 3.4 cm
LA Size Index: 1.7 cm/m2
LA Volume 2C: 53 mL — AB (ref 22–52)
LA Volume 4C: 50 mL (ref 22–52)
LA Volume A/L: 58 mL
LA Volume Index 2C: 27 mL/m2 (ref 16–34)
LA Volume Index 4C: 25 mL/m2 (ref 16–34)
LA Volume Index A/L: 29 mL/m2 (ref 16–34)
LA/AO Root Ratio: 1.21
LV EDV A2C: 84 mL
LV EDV A4C: 109 mL
LV EDV BP: 98 mL (ref 56–104)
LV EDV BP: 98 mL (ref 56–104)
LV EDV Index A2C: 42 mL/m2
LV EDV Index A4C: 55 mL/m2
LV ESV A2C: 44 mL
LV ESV A4C: 47 mL
LV ESV BP: 46 mL (ref 19–49)
LV ESV Index A2C: 22 mL/m2
LV ESV Index A4C: 24 mL/m2
LV ESV Index BP: 23 mL/m2
LV Ejection Fraction A2C: 47 %
LV Ejection Fraction A4C: 57 %
LV Mass 2D Index: 73.8 g/m2 (ref 43–95)
LV Mass 2D: 147.6 g (ref 67–162)
LV RWT Ratio: 0.59
LVIDd Index: 1.7 cm/m2
LVIDd: 3.4 cm — AB (ref 3.9–5.3)
LVIDs Index: 1.5 cm/m2
LVIDs: 3 cm
LVOT Area: 2.5 cm2
LVOT Diameter: 1.8 cm
LVOT Mean Gradient: 4 mmHg
LVOT Peak Gradient: 6 mmHg
LVOT SV: 54.9 ml
LVOT Stroke Volume Index: 27.5 mL/m2
LVOT VTI: 21.6 cm
LVPWd: 1 cm — AB (ref 0.6–0.9)
TAPSE: 1.2 cm — AB (ref 1.5–2.0)

## 2020-12-02 LAB — TRANSTHORACIC ECHOCARDIOGRAM (TTE) COMPLETE (CONTRAST/BUBBLE/3D PRN)
Ao Root Index: 1.4 cm/m2
Aortic Arch: 2.9 cm
Aortic Root: 2.8 cm
Ascending Aorta Index: 1.55 cm/m2
Ascending Aorta: 3.1 cm
EF BP: 53 % — AB (ref 55–100)
EF BP: 53 % — AB (ref 55–100)
Fractional Shortening 2D: 12 % (ref 28–44)
IVSd: 1.6 cm — AB (ref 0.6–0.9)
LA Diameter: 3.4 cm
LA Size Index: 1.7 cm/m2
LA Volume 2C: 53 mL — AB (ref 22–52)
LA Volume 4C: 50 mL (ref 22–52)
LA Volume A/L: 58 mL
LA Volume Index 2C: 27 mL/m2 (ref 16–34)
LA Volume Index 4C: 25 mL/m2 (ref 16–34)
LA Volume Index A/L: 29 mL/m2 (ref 16–34)
LA/AO Root Ratio: 1.21
LV EDV A2C: 84 mL
LV EDV A4C: 109 mL
LV EDV BP: 98 mL (ref 56–104)
LV EDV BP: 98 mL (ref 56–104)
LV EDV Index A2C: 42 mL/m2
LV EDV Index A4C: 55 mL/m2
LV ESV A2C: 44 mL
LV ESV A4C: 47 mL
LV ESV BP: 46 mL (ref 19–49)
LV ESV Index A2C: 22 mL/m2
LV ESV Index A4C: 24 mL/m2
LV ESV Index BP: 23 mL/m2
LV Ejection Fraction A2C: 47 %
LV Ejection Fraction A4C: 57 %
LV Mass 2D Index: 73.8 g/m2 (ref 43–95)
LV Mass 2D: 147.6 g (ref 67–162)
LV RWT Ratio: 0.59
LVIDd Index: 1.7 cm/m2
LVIDd: 3.4 cm — AB (ref 3.9–5.3)
LVIDs Index: 1.5 cm/m2
LVIDs: 3 cm
LVOT Area: 2.5 cm2
LVOT Diameter: 1.8 cm
LVOT Mean Gradient: 4 mmHg
LVOT Peak Gradient: 6 mmHg
LVOT SV: 54.9 ml
LVOT Stroke Volume Index: 27.5 mL/m2
LVOT VTI: 21.6 cm
LVPWd: 1 cm — AB (ref 0.6–0.9)
Left Ventricular Ejection Fraction: 58
TAPSE: 1.2 cm — AB (ref 1.5–2)

## 2020-12-22 MED ORDER — LIRAGLUTIDE 0.6 MG/0.1 ML (18 MG/3 ML) SUB-Q PEN INJECTOR
0.6 mg/0.1 mL (18 mg/3 mL) | SUBCUTANEOUS | 0 refills | Status: DC
Start: 2020-12-22 — End: 2020-12-31

## 2020-12-22 NOTE — Telephone Encounter (Signed)
Last seen Rachelle via [facility] on 09/22/20    Last filled 04/28/20 qty 29ml w/ 3 refills    Future Appointments   Date Time Provider Department Center   12/31/2020 10:15 AM Merdis Delay, MD [facility]   01/03/2021  8:00 AM Negron, Kirstin H, DO [facility] AMB

## 2020-12-30 ENCOUNTER — Encounter

## 2020-12-31 ENCOUNTER — Encounter: Attending: Cardiovascular Disease | Primary: Family

## 2020-12-31 MED ORDER — VICTOZA 3-PAK 0.6 MG/0.1 ML (18 MG/3 ML) SUBCUTANEOUS PEN INJECTOR
0.6 mg/0.1 mL (18 mg/3 mL) | SUBCUTANEOUS | 0 refills | Status: DC
Start: 2020-12-31 — End: 2021-07-13

## 2021-01-03 ENCOUNTER — Encounter: Attending: Family Medicine | Primary: Family

## 2021-01-26 ENCOUNTER — Ambulatory Visit: Attending: Cardiovascular Disease | Primary: Family Medicine

## 2021-01-26 ENCOUNTER — Ambulatory Visit
Admit: 2021-01-26 | Discharge: 2021-01-26 | Payer: PRIVATE HEALTH INSURANCE | Attending: Cardiovascular Disease | Primary: Family

## 2021-01-26 DIAGNOSIS — I10 Essential (primary) hypertension: Secondary | ICD-10-CM

## 2021-01-26 NOTE — Progress Notes (Signed)
 Identified pt with two pt identifiers(name and DOB). Reviewed record in preparation for visit and have obtained necessary documentation.    Christy [name] presents today for   Chief Complaint   Patient presents with   . Follow-up     4w       Pt c/o  SOB, CHEST PRESSURE and SWELLING.             Christy [name] preferred language for health care discussion is english/other.    Personal Protective Equipment:   Engineer, maintenance was used including: mask-surgical and hands-gloves. Patient was placed on no precaution(s). Patient was masked.    Precautions:   Patient currently on None  Patient currently roomed with door closed.    Is someone accompanying this pt? Yes female    Is the patient using any DME equipment during OV? no    Depression Screening:  3 most recent PHQ Screens 11/30/2020   Little interest or pleasure in doing things Not at all   Feeling down, depressed, irritable, or hopeless Not at all   Total Score PHQ 2 0   Trouble falling or staying asleep, or sleeping too much -   Feeling tired or having little energy -   Poor appetite, weight loss, or overeating -   Feeling bad about yourself - or that you are a failure or have let yourself or your family down -   Trouble concentrating on things such as school, work, reading, or watching TV -   Moving or speaking so slowly that other people could have noticed; or the opposite being so fidgety that others notice -   Thoughts of being better off dead, or hurting yourself in some way -   PHQ 9 Score -       Learning Assessment:  No flowsheet data found.    Abuse Screening:  Abuse Screening Questionnaire 08/18/2020   Do you ever feel afraid of your partner? N   Are you in a relationship with someone who physically or mentally threatens you? N   Is it safe for you to go home? Y       Fall Risk  Fall Risk Assessment, last 12 mths 10/22/2019   Able to walk? Yes   Fall in past 12 months? 0   Do you feel unsteady? 1   Are you worried about falling 1       Pt  currently taking Anticoagulant therapy? no  Pt currently taking Antiplatelet therapy? no    Coordination of Care:  1. Have you been to the ER, urgent care clinic since your last visit? Hospitalized since your last visit?  no    2. Have you seen or consulted any other health care providers outside of the [facility] since your last visit? Include any pap smears or colon screening.  yes      Please see Red banners under Allergies and Med Rec to remove outside inquires. All correct information has been verified with patient and added to chart.     Medication's patient's would liked removed has been marked not taking to be removed per Verbal order and read back per Elsie Pang, MD

## 2021-01-26 NOTE — Telephone Encounter (Signed)
 PCP: Graylin Willma RAMAN, NP    Last appt: 01/26/2021  No future appointments.    Requested Prescriptions     Pending Prescriptions Disp Refills   . metoprolol  tartrate (LOPRESSOR ) 100 mg IR tablet 180 Tablet 3     Sig: Take 1 Tablet by mouth two (2) times a day.           Other Comments:    Verbal order with read back Dr Bunnie.  Dosage increase

## 2021-01-26 NOTE — Progress Notes (Signed)
Subjective:      Christy [name] is in the office today for cardiac reevaluation. She is a 45 year old woman that has been told she had enlarged heart in the past. Recent CT CTA of the chest demonstrated a normal-sized heart.  She had a normal nuclear stress test done on 07/02/2018.    The patient reported a several year history of chest pain. It was described as "stabbing "in quality. It was also described as sometimes dull. It could last up to 5 minutes. It generally goes away without intervention. She took an occasional aspirin. She localized the discomfort to the left peristernal and inframammary areas. The patient also experienced shortness of breath. She said that one flight of steps would cause her to be very short of breath. She  had no PND or orthopnea.    In the office on 11/26/2020, she reported that she was feeling "okay ".  She reported a recent  ED visit.  She  presented with fatigue, malaise and nausea.  She tested negative for Covid.    Today after some changes in her medications, blood pressure still quite elevated.  She reported that home this morning it was 140/100.  She continues to have palpitations.  She has occasional shortness of breath at rest.  She has had no PND or orthopnea.     Patient's cardiac risk factors are smoking/ tobacco exposure, family history, dyslipidemia, diabetes mellitus, hypertension.        Patient Active Problem List    Diagnosis Date Noted   ??? History of nuclear stress test 09/18/2020   ??? Palpitations 09/18/2020   ??? Tobacco abuse 09/18/2020   ??? Family history of premature CAD 09/18/2020   ??? Obesity, morbid (HCC) 10/22/2019   ??? Spinal stenosis    ??? IBS (irritable bowel syndrome)    ??? Chest pain 12/10/2012   ??? Dizziness 12/10/2012   ??? CAD (coronary artery disease) 06/06/2011   ??? Hyperlipidemia with target low density lipoprotein (LDL) cholesterol less than 70 mg/dL 37/16/9678   ??? Hypertension 06/06/2011   ??? Obesity 06/06/2011   ??? DM (diabetes mellitus) (HCC) 06/06/2011   ??? GERD  (gastroesophageal reflux disease) 06/06/2011     Current Outpatient Medications   Medication Sig Dispense Refill   ??? liraglutide (Victoza 3-Pak) 0.6 mg/0.1 mL (18 mg/3 mL) pnij ADMINISTER 1.8MG  SUBCUTANEOUSLY ONCE A DAY  Indications: type 2 diabetes mellitus 9 Adjustable Dose Pre-filled Pen Syringe 0   ??? methocarbamoL (ROBAXIN) 750 mg tablet Take 1 Tablet by mouth four (4) times daily as needed for Muscle Spasm(s) or Pain. 50 Tablet 0   ??? celecoxib (CELEBREX) 200 mg capsule Take 1 Capsule by mouth two (2) times daily as needed for Pain. 30 Capsule 0   ??? olmesartan (BENICAR) 40 mg tablet Take 1 Tablet by mouth daily. 30 Tablet 5   ??? amLODIPine (NORVASC) 5 mg tablet Take 5 mg by mouth daily.     ??? SITagliptin (Januvia) 100 mg tablet Take 100 mg by mouth daily.     ??? Insulin Needles, Disposable, 31 gauge x 5/16" ndle Use twice daily for insulin and victoza injections. 100 Each 11   ??? indapamide (LOZOL) 1.25 mg tablet TAKE 1 TABLET BY MOUTH EVERY DAY 30 Tablet 3   ??? levothyroxine (SYNTHROID) 200 mcg tablet Take 1 Tablet by mouth Daily (before breakfast). 30 Tablet 2   ??? buPROPion (WELLBUTRIN) 75 mg tablet Take 1 Tablet by mouth two (2) times a day. 60 Tablet 5   ???  Insulin Needles, Disposable, (Pen Needle) 32 gauge x 5/32" ndle 200 Each by Does Not Apply route daily. 200 Pen Needle 3   ??? cloNIDine HCL (CATAPRES) 0.3 mg tablet Take 1 Tablet by mouth two (2) times a day. 180 Tablet 1   ??? lancets misc Check blood sugar 3 times daily 300 Each 5   ??? glucose blood VI test strips (OneTouch Verio test strips) strip Her machine is Verio flex  3/day 300 Strip 5   ??? gabapentin (NEURONTIN) 100 mg capsule Take 1 Capsule by mouth three (3) times daily. Max Daily Amount: 300 mg. 90 Capsule 1   ??? insulin glargine (Lantus Solostar U-100 Insulin) 100 unit/mL (3 mL) inpn Inject 10 units subcutaneously every night at bedtime. 45 mL 3   ??? Insulin Needles, Disposable, 30 gauge x 1/3" Inject subcutaneously every night at bedtime. 1 Package 11    ??? ARIPiprazole (ABILIFY) 5 mg tablet 5 mg daily.     ??? aspirin delayed-release 81 mg tablet 81 mg.     ??? lamoTRIgine (LaMICtal) 25 mg tablet 50 mg. oNCE A DAY     ??? glucose blood VI test strips (ASCENSIA AUTODISC VI, ONE TOUCH ULTRA TEST VI) strip by Does Not Apply route See Admin Instructions.     ??? irbesartan (AVAPRO) 150 mg tablet Take 1 Tab by mouth nightly. 30 Tab 5   ??? metoprolol tartrate (LOPRESSOR) 100 mg IR tablet Take 1 Tablet by mouth two (2) times a day. 180 Tablet 3   ??? metFORMIN (GLUCOPHAGE) 1,000 mg tablet Take 1 Tablet by mouth two (2) times daily (with meals) for 90 days. 180 Tablet 1   ??? levothyroxine (SYNTHROID) 175 mcg tablet Take 1 Tablet by mouth Daily (before breakfast) for 90 days. ONCE A DAY 30 MINUTES BEFORE MEALS 90 Tablet 1   ??? atorvastatin (Lipitor) 20 mg tablet Take 1 Tablet by mouth daily for 90 days. 90 Tablet 1   ??? labetaloL (NORMODYNE) 200 mg tablet Take 1 Tablet by mouth two (2) times a day for 90 days. TWICE A DAY (Patient not taking: Reported on 11/30/2020) 180 Tablet 1   ??? cloNIDine HCL (CATAPRES) 0.2 mg tablet Take 1 Tablet by mouth two (2) times a day for 30 days. 60 Tablet 3     Allergies   Allergen Reactions   ??? Vicodin [Hydrocodone-Acetaminophen] Itching   ??? Lisinopril-Hydrochlorothiazide Other (comments)     Dry cough    ??? Pcn [Penicillins] Other (comments)     Past Medical History:   Diagnosis Date   ??? Acid indigestion    ??? Acid reflux    ??? Asthma    ??? CAD (coronary artery disease)    ??? Depression    ??? Diabetes (HCC)    ??? Diabetic neuropathy (HCC)    ??? 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 Enlarged heart    ??? Hypertension    ??? IBS (irritable bowel syndrome)    ??? Interstitial cystitis    ??? Sciatica    ??? Spinal stenosis      Past Surgical History:   Procedure Laterality Date   ??? HX CHOLECYSTECTOMY      2008   ??? HX ORTHOPAEDIC      LEFT KNEE   ??? HX ORTHOPAEDIC      LEFT ARM   ??? HX PARTIAL HYSTERECTOMY  2003    STILL HAVE OVARIES   ??? HX PELVIC LAPAROSCOPY     ???  HX  THYROIDECTOMY       Family History   Problem Relation Age of Onset   ??? Diabetes Mother    ??? Heart Disease Mother    ??? Diabetes Father    ??? Cancer Sister    ??? Heart Disease Maternal Aunt    ??? Cancer Paternal Aunt      Social History     Tobacco Use   Smoking Status Current Every Day Smoker   ??? Packs/day: 0.25   Smokeless Tobacco Never Used          Review of Systems, additional:  Constitutional: negative  Eyes: negative  Respiratory: positive for dyspnea on exertion  Cardiovascular: positive for chest pain, palpitations, dyspnea on exertion  Gastrointestinal: negative  Musculoskeletal:negative  Neurological: negative  Behvioral/Psych: negative  Endocrine: negative  ENT: negative    Objective:     Visit Vitals  BP (!) 173/103   Pulse (!) 120   Temp 97.8 ??F (36.6 ??C) (Temporal)   Resp 18   Wt 100.2 kg (221 lb)   SpO2 99%   BMI 41.76 kg/m??     General:  alert, cooperative, no distress   Chest Wall: inspection normal - no chest wall deformities or tenderness, respiratory effort normal   Lung: clear to auscultation bilaterally   Heart:  normal rate and regular rhythm, S1 and S2 normal, no murmurs noted, no gallops noted, no JVD   Abdomen: soft, non-tender. Bowel sounds normal. No masses,  no organomegaly   Extremities: extremities normal, atraumatic, no cyanosis or edema Skin: no rashes   Neuro: alert, oriented, normal speech, no focal findings or movement disorder noted     EKG: 09/10/2020. Sinus tachycardia. Otherwise normal.    Assessment/Plan:       ICD-10-CM ICD-9-CM    1. Primary hypertension, moderately to severely elevated  BP in the office today.  Asymptomatic.  Patient reports better BP numbers at home.  At last visit the losartan was discontinued and Benicar 40 mg daily was started.  Will start metoprolol 100 mg twice daily.  Will DC diltiazem.  Return in 4 weeks. I10 401.9 ECHO ADULT COMPLETE      TSH AND FREE T4   2. DOE (dyspnea on exertion)  R06.00 786.09 ECHO ADULT COMPLETE      TSH AND FREE T4       CARDIAC HOLTER MONITOR      AMB POC EKG ROUTINE W/ 12 LEADS, INTER & REP   3. Screening for thyroid disorder  Z13.29 V77.0 TSH AND FREE T4   4. Hyperlipidemia with target low density lipoprotein (LDL) cholesterol less than 70 mg/dL, 15/40/0867. Total cholesterol 168. HDL 58. Triglycerides 115.  ? LDL not done.  Patient on Lipitor 20 mg daily. E78.5 272.4    5. Diabetes mellitus due to underlying condition with hyperosmolarity without coma, unspecified whether long term insulin use (HCC)  E08.00 249.20    6. History of nuclear stress test , 07/02/2018, low risk. Z92.89 V15.89    7. Chest pain, unspecified type  R07.9 786.50    8. Palpitations ,  echocardiogram completed on 11/24/2020.  EF 55 to 60%.  Indeterminate diastolic function.  Moderate concentric LVH.  R00.2 785.1    9. Tobacco abuse , Wellbutrin 75 mg twice daily. Z72.0 305.1

## 2021-01-27 MED ORDER — METOPROLOL TARTRATE 100 MG TAB
100 mg | ORAL_TABLET | Freq: Two times a day (BID) | ORAL | 3 refills | Status: DC
Start: 2021-01-27 — End: 2021-03-24

## 2021-02-04 ENCOUNTER — Encounter: Attending: Internal Medicine | Primary: Family

## 2021-02-25 ENCOUNTER — Encounter: Attending: Internal Medicine | Primary: Family Medicine

## 2021-03-07 ENCOUNTER — Ambulatory Visit
Admit: 2021-03-07 | Discharge: 2021-03-07 | Payer: PRIVATE HEALTH INSURANCE | Attending: Cardiovascular Disease | Primary: Family Medicine

## 2021-03-07 DIAGNOSIS — I10 Essential (primary) hypertension: Secondary | ICD-10-CM

## 2021-03-07 NOTE — Progress Notes (Signed)
Subjective:      Christy [name] is in the office today for cardiac reevaluation. She is a 45 year old woman that has been told she had enlarged heart in the past. Recent CT CTA of the chest demonstrated a normal-sized heart.  She had a normal nuclear stress test done on 07/02/2018.    The patient reported a several year history of chest pain. It was described as "stabbing "in quality. It was also described as sometimes dull. It could last up to 5 minutes. It generally goes away without intervention. She took an occasional aspirin. She localized the discomfort to the left peristernal and inframammary areas. The patient also experienced shortness of breath. She said that one flight of steps would cause her to be very short of breath. She  had no PND or orthopnea.    In the office on 11/26/2020, she reported that she was feeling "okay ".  She reported a recent  ED visit.  She  presented with fatigue, malaise and nausea.  She tested negative for Covid.    At her last appointment her blood pressure still quite elevated.  Metoprolol was added to her regimen but she felt that it was making her lower extremity swell.  She was given clonidine which she is taking only at night.  She remains on the Benicar.     Patient's cardiac risk factors are smoking/ tobacco exposure, family history, dyslipidemia, diabetes mellitus, hypertension.        Patient Active Problem List    Diagnosis Date Noted   ??? History of nuclear stress test 09/18/2020   ??? Palpitations 09/18/2020   ??? Tobacco abuse 09/18/2020   ??? Family history of premature CAD 09/18/2020   ??? Obesity, morbid ([place]) 10/22/2019   ??? Spinal stenosis    ??? IBS (irritable bowel syndrome)    ??? Chest pain 12/10/2012   ??? Dizziness 12/10/2012   ??? CAD (coronary artery disease) 06/06/2011   ??? Hyperlipidemia with target low density lipoprotein (LDL) cholesterol less than 70 mg/dL 06/06/2011   ??? Hypertension 06/06/2011   ??? Obesity 06/06/2011   ??? DM (diabetes mellitus) ([place]) 06/06/2011   ??? GERD  (gastroesophageal reflux disease) 06/06/2011     Current Outpatient Medications   Medication Sig Dispense Refill   ??? metoprolol tartrate (LOPRESSOR) 100 mg IR tablet Take 1 Tablet by mouth two (2) times a day. (Patient not taking: Reported on 03/16/2021) 180 Tablet 3   ??? liraglutide (Victoza 3-Pak) 0.6 mg/0.1 mL (18 mg/3 mL) pnij ADMINISTER 1.'8MG'$  SUBCUTANEOUSLY ONCE A DAY  Indications: type 2 diabetes mellitus 9 Adjustable Dose Pre-filled Pen Syringe 0   ??? methocarbamoL (ROBAXIN) 750 mg tablet Take 1 Tablet by mouth four (4) times daily as needed for Muscle Spasm(s) or Pain. 50 Tablet 0   ??? olmesartan (BENICAR) 40 mg tablet Take 1 Tablet by mouth daily. 30 Tablet 5   ??? indapamide (LOZOL) 1.25 mg tablet TAKE 1 TABLET BY MOUTH EVERY DAY (Patient not taking: Reported on 03/16/2021) 30 Tablet 3   ??? levothyroxine (SYNTHROID) 200 mcg tablet Take 1 Tablet by mouth Daily (before breakfast). 30 Tablet 2   ??? buPROPion (WELLBUTRIN) 75 mg tablet Take 1 Tablet by mouth two (2) times a day. 60 Tablet 5   ??? cloNIDine HCL (CATAPRES) 0.3 mg tablet Take 1 Tablet by mouth two (2) times a day. 180 Tablet 1   ??? insulin glargine (Lantus Solostar U-100 Insulin) 100 unit/mL (3 mL) inpn Inject 10 units subcutaneously every night at bedtime. [place]  mL 3   ??? ARIPiprazole (ABILIFY) 5 mg tablet Take 5 mg by mouth daily.     ??? aspirin delayed-release 81 mg tablet 81 mg.     ??? lamoTRIgine (LaMICtal) 25 mg tablet 50 mg. oNCE A DAY (Patient not taking: Reported on 03/16/2021)     ??? traZODone (DESYREL) 50 mg tablet TAKE 1 TABLET BY MOUTH AT BEDTIME AS NEEDED FOR SLEEP     ??? hydrOXYzine pamoate (VISTARIL) 50 mg capsule TAKE 1 CAPSULE BY MOUTH 4 TIMES A DAY AS NEEDED FOR ANXIETY     ??? omeprazole (PRILOSEC) 40 mg capsule Take 1 Capsule by mouth daily for 14 days. After 14 days, can continue with 1 capsule by mouth daily as needed. 30 Capsule 1   ??? alum-mag hydroxide-simeth (MYLANTA) 200-200-20 mg/5 mL susp Take 30 mL by mouth every four (4) hours as needed  for GI Pain for up to 5 days. 1 Each 0   ??? ondansetron (ZOFRAN ODT) 4 mg disintegrating tablet Take 1 Tablet by mouth every eight (8) hours as needed for Nausea or Vomiting for up to 5 days. 15 Tablet 0   ??? glucose blood VI test strips (True Metrix Glucose Test Strip) strip Use to test blood sugar once daily 100 Strip 11   ??? lancets misc Use to test blood sugar once daily 1 Each 11   ??? Blood-Glucose Meter (True Metrix Glucose Meter) misc 1 Kit by Does Not Apply route daily. 1 Each 0   ??? Insulin Needles, Disposable, 31 gauge x 5/16" ndle Use twice daily for insulin and victoza injections. 100 Each 11   ??? SITagliptin (Januvia) 100 mg tablet Take 1 Tablet by mouth daily. Indications: type 2 diabetes mellitus 90 Tablet 2   ??? celecoxib (CELEBREX) 200 mg capsule Take 1 Capsule by mouth two (2) times daily as needed for Pain. Best tolerated if taken with food. Caution with long term regular use. 60 Capsule 1   ??? Latuda 40 mg tab tablet Take 1 Tablet by mouth nightly.     ??? PARoxetine (PAXIL) 30 mg tablet Take 30 mg by mouth nightly.     ??? metFORMIN (GLUCOPHAGE) 1,000 mg tablet Take 1 Tablet by mouth two (2) times daily (with meals) for 90 days. 180 Tablet 1   ??? atorvastatin (Lipitor) 20 mg tablet Take 1 Tablet by mouth daily for 90 days. (Patient not taking: Reported on 03/16/2021) 90 Tablet 1   ??? gabapentin (NEURONTIN) 100 mg capsule Take 1 Capsule by mouth three (3) times daily. Max Daily Amount: 300 mg. (Patient not taking: Reported on 03/07/2021) 90 Capsule 1   ??? labetaloL (NORMODYNE) 200 mg tablet Take 1 Tablet by mouth two (2) times a day for 90 days. TWICE A DAY (Patient not taking: Reported on 11/30/2020) 180 Tablet 1     Allergies   Allergen Reactions   ??? Vicodin [Hydrocodone-Acetaminophen] Itching   ??? Lisinopril-Hydrochlorothiazide Other (comments)     Dry cough    ??? Pcn [Penicillins] Other (comments)     Past Medical History:   Diagnosis Date   ??? Acid indigestion    ??? Acid reflux    ??? Asthma    ??? CAD (coronary artery  disease)    ??? Depression    ??? Diabetes ([place])    ??? Diabetic neuropathy ([place])    ??? 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 Enlarged heart    ??? Hypertension    ??? IBS (  irritable bowel syndrome)    ??? Interstitial cystitis    ??? Sciatica    ??? Spinal stenosis      Past Surgical History:   Procedure Laterality Date   ??? HX CHOLECYSTECTOMY      2008   ??? HX ORTHOPAEDIC      LEFT KNEE   ??? HX ORTHOPAEDIC      LEFT ARM   ??? HX PARTIAL HYSTERECTOMY  2003    STILL HAVE OVARIES   ??? HX PELVIC LAPAROSCOPY     ??? HX THYROIDECTOMY       Family History   Problem Relation Age of Onset   ??? Diabetes Mother    ??? Heart Disease Mother    ??? Diabetes Father    ??? Cancer Sister    ??? Heart Disease Maternal Aunt    ??? Cancer Paternal Aunt      Social History     Tobacco Use   Smoking Status Current Every Day Smoker   ??? Packs/day: 0.25   Smokeless Tobacco Never Used          Review of Systems, additional:  Constitutional: negative  Eyes: negative  Respiratory: positive for dyspnea on exertion  Cardiovascular: positive for chest pain, palpitations, dyspnea on exertion  Gastrointestinal: negative  Musculoskeletal:negative  Neurological: negative  Behvioral/Psych: negative  Endocrine: negative  ENT: negative    Objective:     Visit Vitals  BP 124/70   Pulse 89   Temp 97.4 ??F (36.3 ??C) (Temporal)   Wt 105 kg (231 lb 6.4 oz)   SpO2 99%   BMI 43.72 kg/m??     General:  alert, cooperative, no distress   Chest Wall: inspection normal - no chest wall deformities or tenderness, respiratory effort normal   Lung: clear to auscultation bilaterally   Heart:  normal rate and regular rhythm, S1 and S2 normal, no murmurs noted, no gallops noted, no JVD   Abdomen: soft, non-tender. Bowel sounds normal. No masses,  no organomegaly   Extremities: extremities normal, atraumatic, no cyanosis or edema Skin: no rashes   Neuro: alert, oriented, normal speech, no focal findings or movement disorder noted     EKG: 09/10/2020. Sinus tachycardia. Otherwise  normal.    Assessment/Plan:       ICD-10-CM ICD-9-CM    1. Primary hypertension, controlled   BP in the office today.  Asymptomatic.Marland Kitchen  Started metoprolol 100 mg twice daily at last visit.  She stopped the metoprolol because of  leg swelling.  The swelling improved.  She was prescribed clonidine 0.3 mg twice daily and takes 1 at night.  Continue present Rx.  Return in 3 months. I10 401.9 ECHO ADULT COMPLETE      TSH AND FREE T4   2. DOE (dyspnea on exertion)  R06.00 786.09 ECHO ADULT COMPLETE      TSH AND FREE T4      CARDIAC HOLTER MONITOR      AMB POC EKG ROUTINE W/ 12 LEADS, INTER & REP   3. Screening for thyroid disorder  Z13.29 V77.0 TSH AND FREE T4   4. Hyperlipidemia with target low density lipoprotein (LDL) cholesterol less than 70 mg/dL, 10/22/2019. Total cholesterol 168. HDL 58. Triglycerides 115.  ? LDL not done.  Patient on Lipitor 20 mg daily. E78.5 272.4    5. Diabetes mellitus due to underlying condition with hyperosmolarity without coma, unspecified whether long term insulin use (HCC)  E08.00 249.20    6. History of nuclear stress test ,  07/02/2018, low risk. Z92.89 V15.89    7. Chest pain, unspecified type  R07.9 786.50    8. Palpitations ,  echocardiogram completed on 11/24/2020.  EF 55 to 60%.  Indeterminate diastolic function.  Moderate concentric LVH.  R00.2 785.1    9. Tobacco abuse , Wellbutrin 75 mg twice daily. Z72.0 305.1

## 2021-03-07 NOTE — Progress Notes (Signed)
Identified pt with two pt identifiers(name and DOB). Reviewed record in preparation for visit and have obtained necessary documentation.    Christy [name] presents today for No chief complaint on file.      Pt denies DIZZINESS, SOB, CHEST PAIN/ PRESSURE, FATIGUE/WEAKNESS, HEADACHES, SWELLING.             Christy [name] preferred language for health care discussion is english/other.    Personal Protective Equipment:   Engineer, maintenance was used including: mask-surgical and hands-gloves. Patient was placed on no precaution(s). Patient was masked.    Precautions:   Patient currently on None  Patient currently roomed with door closed.    Is someone accompanying this pt? No     Is the patient using any DME equipment during OV? No     Depression Screening:  3 most recent PHQ Screens 01/26/2021   PHQ Not Done Patient refuses   Little interest or pleasure in doing things -   Feeling down, depressed, irritable, or hopeless -   Total Score PHQ 2 -   Trouble falling or staying asleep, or sleeping too much -   Feeling tired or having little energy -   Poor appetite, weight loss, or overeating -   Feeling bad about yourself - or that you are a failure or have let yourself or your family down -   Trouble concentrating on things such as school, work, reading, or watching TV -   Moving or speaking so slowly that other people could have noticed; or the opposite being so fidgety that others notice -   Thoughts of being better off dead, or hurting yourself in some way -   PHQ 9 Score -       Learning Assessment:  No flowsheet data found.    Abuse Screening:  Abuse Screening Questionnaire 08/18/2020   Do you ever feel afraid of your partner? N   Are you in a relationship with someone who physically or mentally threatens you? N   Is it safe for you to go home? Y       Fall Risk  Fall Risk Assessment, last 12 mths 10/22/2019   Able to walk? Yes   Fall in past 12 months? 0   Do you feel unsteady? 1   Are you worried about  falling 1       Pt currently taking Anticoagulant therapy? no  Pt currently taking Antiplatelet therapy? yes    Coordination of Care:  1. Have you been to the ER, urgent care clinic since your last visit? Hospitalized since your last visit? No     2. Have you seen or consulted any other health care providers outside of the [facility] since your last visit? Include any pap smears or colon screening. Yes.      Please see Red banners under Allergies and Med Rec to remove outside inquires. All correct information has been verified with patient and added to chart.     Medication's patient's would liked removed has been marked not taking to be removed per Verbal order and read back per Merdis Delay, MD

## 2021-03-14 ENCOUNTER — Encounter

## 2021-03-14 NOTE — Telephone Encounter (Signed)
Dr Negron.

## 2021-03-15 MED ORDER — CELECOXIB 200 MG CAP
200 mg | ORAL_CAPSULE | Freq: Two times a day (BID) | ORAL | 1 refills | Status: DC | PRN
Start: 2021-03-15 — End: 2021-03-24

## 2021-03-16 ENCOUNTER — Other Ambulatory Visit

## 2021-03-16 ENCOUNTER — Ambulatory Visit: Attending: Family Medicine | Primary: Family Medicine

## 2021-03-16 ENCOUNTER — Ambulatory Visit
Admit: 2021-03-16 | Discharge: 2021-03-16 | Payer: PRIVATE HEALTH INSURANCE | Attending: Family Medicine | Primary: Family Medicine

## 2021-03-16 ENCOUNTER — Encounter: Admit: 2021-03-16 | Discharge: 2021-03-16 | Payer: PRIVATE HEALTH INSURANCE | Primary: Family Medicine

## 2021-03-16 ENCOUNTER — Inpatient Hospital Stay: Admit: 2021-03-16 | Payer: PRIVATE HEALTH INSURANCE | Primary: Family Medicine

## 2021-03-16 DIAGNOSIS — Z0000 Encounter for general adult medical examination without abnormal findings: Secondary | ICD-10-CM

## 2021-03-16 DIAGNOSIS — R1010 Upper abdominal pain, unspecified: Secondary | ICD-10-CM

## 2021-03-16 LAB — T4, FREE
T4 Free: 1.1 NG/DL (ref 0.7–1.5)
T4, Free: 1.1 NG/DL (ref 0.7–1.5)

## 2021-03-16 LAB — CBC WITH AUTOMATED DIFF
ABS. BASOPHILS: 0 10*3/uL (ref 0.0–0.1)
ABS. EOSINOPHILS: 0.3 10*3/uL (ref 0.0–0.4)
ABS. IMM. GRANS.: 0 10*3/uL (ref 0.00–0.04)
ABS. LYMPHOCYTES: 2.9 10*3/uL (ref 0.9–3.6)
ABS. MONOCYTES: 0.3 10*3/uL (ref 0.05–1.2)
ABS. NEUTROPHILS: 4.2 10*3/uL (ref 1.8–8.0)
ABSOLUTE NRBC: 0 10*3/uL (ref 0.00–0.01)
BASOPHILS: 0 % (ref 0–2)
EOSINOPHILS: 4 % (ref 0–5)
HCT: 33.6 % — ABNORMAL LOW (ref 35.0–45.0)
HGB: 10.8 g/dL — ABNORMAL LOW (ref 12.0–16.0)
IMMATURE GRANULOCYTES: 1 % — ABNORMAL HIGH (ref 0.0–0.5)
LYMPHOCYTES: 37 % (ref 21–52)
MCH: 28.2 PG (ref 24.0–34.0)
MCHC: 32.1 g/dL (ref 31.0–37.0)
MCV: 87.7 FL (ref 78.0–100.0)
MONOCYTES: 4 % (ref 3–10)
MPV: 14.5 FL — ABNORMAL HIGH (ref 9.2–11.8)
NEUTROPHILS: 54 % (ref 40–73)
NRBC: 0 PER 100 WBC
PLATELET: 232 10*3/uL (ref 135–420)
RBC: 3.83 M/uL — ABNORMAL LOW (ref 4.20–5.30)
RDW: 13.5 % (ref 11.6–14.5)
WBC: 7.7 10*3/uL (ref 4.6–13.2)

## 2021-03-16 LAB — METABOLIC PANEL, COMPREHENSIVE
A-G Ratio: 1 (ref 0.8–1.7)
ALT (SGPT): 18 U/L (ref 13–56)
AST (SGOT): 8 U/L — ABNORMAL LOW (ref 10–38)
Albumin: 3.4 g/dL (ref 3.4–5.0)
Alk. phosphatase: 71 U/L (ref 45–117)
Anion gap: 6 mmol/L (ref 3.0–18)
BUN/Creatinine ratio: 13 (ref 12–20)
BUN: 12 MG/DL (ref 7.0–18)
Bilirubin, total: 0.2 MG/DL (ref 0.2–1.0)
CO2: 26 mmol/L (ref 21–32)
Calcium: 8.7 MG/DL (ref 8.5–10.1)
Chloride: 106 mmol/L (ref 100–111)
Creatinine: 0.92 MG/DL (ref 0.6–1.3)
GFR est AA: >60 mL/min/{1.73_m2} (ref >60)
GFR est non-AA: >60 mL/min/{1.73_m2} (ref >60)
Globulin: 3.5 g/dL (ref 2.0–4.0)
Glucose: 203 mg/dL — ABNORMAL HIGH (ref 74–99)
Potassium: 4.2 mmol/L (ref 3.5–5.5)
Protein, total: 6.9 g/dL (ref 6.4–8.2)
Sodium: 138 mmol/L (ref 136–145)

## 2021-03-16 LAB — LIPID PANEL
CHOL/HDL Ratio: 4.9 (ref 0–5.0)
Chol/HDL Ratio: 4.9 (ref 0–5.0)
Cholesterol, Total: 272 MG/DL — ABNORMAL HIGH (ref <200)
Cholesterol, total: 272 MG/DL — ABNORMAL HIGH (ref <200)
HDL Cholesterol: 56 MG/DL (ref 40–60)
HDL: 56 MG/DL (ref 40–60)
LDL Calculated: 184 MG/DL — ABNORMAL HIGH (ref 0–100)
LDL, calculated: 184 MG/DL — ABNORMAL HIGH (ref 0–100)
Triglyceride: 160 MG/DL — ABNORMAL HIGH (ref <150)
Triglycerides: 160 MG/DL — ABNORMAL HIGH (ref <150)
VLDL Cholesterol Calculated: 32 MG/DL
VLDL, calculated: 32 MG/DL

## 2021-03-16 LAB — TSH 3RD GENERATION
TSH: 3.71 u[IU]/mL (ref 0.36–3.74)
TSH: 3.71 u[IU]/mL (ref 0.36–3.74)

## 2021-03-16 LAB — C REACTIVE PROTEIN, QT: C-Reactive protein: 0.6 mg/dL — ABNORMAL HIGH (ref 0–0.3)

## 2021-03-16 LAB — HEMOGLOBIN A1C WITH EAG
Est. average glucose: 206 mg/dL
Hemoglobin A1c: 8.8 % — ABNORMAL HIGH (ref 4.2–5.6)

## 2021-03-16 LAB — VITAMIN D, 25 HYDROXY: Vitamin D 25-Hydroxy: 18.1 ng/mL — ABNORMAL LOW (ref 30–100)

## 2021-03-16 LAB — VITAMIN B12
Vitamin B-12: 202 pg/mL — ABNORMAL LOW (ref 211–911)
Vitamin B12: 202 pg/mL — ABNORMAL LOW (ref 211–911)

## 2021-03-16 LAB — CBC WITH AUTO DIFFERENTIAL
Basophils %: 0 % (ref 0–2)
Basophils Absolute: 0 10*3/uL (ref 0.0–0.1)
Eosinophils %: 4 % (ref 0–5)
Eosinophils Absolute: 0.3 10*3/uL (ref 0.0–0.4)
Granulocyte Absolute Count: 0 10*3/uL (ref 0.00–0.04)
Hematocrit: 33.6 % — ABNORMAL LOW (ref 35.0–45.0)
Hemoglobin: 10.8 g/dL — ABNORMAL LOW (ref 12.0–16.0)
Immature Granulocytes: 1 % — ABNORMAL HIGH (ref 0.0–0.5)
Lymphocytes %: 37 % (ref 21–52)
Lymphocytes Absolute: 2.9 10*3/uL (ref 0.9–3.6)
MCH: 28.2 PG (ref 24.0–34.0)
MCHC: 32.1 g/dL (ref 31.0–37.0)
MCV: 87.7 FL (ref 78.0–100.0)
MPV: 14.5 FL — ABNORMAL HIGH (ref 9.2–11.8)
Monocytes %: 4 % (ref 3–10)
Monocytes Absolute: 0.3 10*3/uL (ref 0.05–1.2)
NRBC Absolute: 0 10*3/uL (ref 0.00–0.01)
Neutrophils %: 54 % (ref 40–73)
Neutrophils Absolute: 4.2 10*3/uL (ref 1.8–8.0)
Nucleated RBCs: 0 PER 100 WBC
Platelets: 232 10*3/uL (ref 135–420)
RBC: 3.83 M/uL — ABNORMAL LOW (ref 4.20–5.30)
RDW: 13.5 % (ref 11.6–14.5)
WBC: 7.7 10*3/uL (ref 4.6–13.2)

## 2021-03-16 LAB — C-REACTIVE PROTEIN: CRP: 0.6 mg/dL — ABNORMAL HIGH (ref 0–0.3)

## 2021-03-16 LAB — COMPREHENSIVE METABOLIC PANEL
ALT: 18 U/L (ref 13–56)
AST: 8 U/L — ABNORMAL LOW (ref 10–38)
Albumin/Globulin Ratio: 1 (ref 0.8–1.7)
Albumin: 3.4 g/dL (ref 3.4–5.0)
Alkaline Phosphatase: 71 U/L (ref 45–117)
Anion Gap: 6 mmol/L (ref 3.0–18)
BUN: 12 MG/DL (ref 7.0–18)
Bun/Cre Ratio: 13 (ref 12–20)
CO2: 26 mmol/L (ref 21–32)
Calcium: 8.7 MG/DL (ref 8.5–10.1)
Chloride: 106 mmol/L (ref 100–111)
Creatinine: 0.92 MG/DL (ref 0.6–1.3)
EGFR IF NonAfrican American: >60 mL/min/{1.73_m2} (ref >60)
GFR African American: >60 mL/min/{1.73_m2} (ref >60)
Globulin: 3.5 g/dL (ref 2.0–4.0)
Glucose: 203 mg/dL — ABNORMAL HIGH (ref 74–99)
Potassium: 4.2 mmol/L (ref 3.5–5.5)
Sodium: 138 mmol/L (ref 136–145)
Total Bilirubin: 0.2 MG/DL (ref 0.2–1.0)
Total Protein: 6.9 g/dL (ref 6.4–8.2)

## 2021-03-16 LAB — HEMOGLOBIN A1C W/EAG
Hemoglobin A1C: 8.8 % — ABNORMAL HIGH (ref 4.2–5.6)
eAG: 206 mg/dL

## 2021-03-16 LAB — VITAMIN D 25 HYDROXY: Vit D, 25-Hydroxy: 18.1 ng/mL — ABNORMAL LOW (ref 30–100)

## 2021-03-16 MED ORDER — TRUE METRIX GLUCOSE TEST STRIP
ORAL_STRIP | 11 refills | Status: AC
Start: 2021-03-16 — End: ?

## 2021-03-16 MED ORDER — OMEPRAZOLE 40 MG CAP, DELAYED RELEASE
40 mg | ORAL_CAPSULE | Freq: Every day | ORAL | 1 refills | Status: AC
Start: 2021-03-16 — End: 2021-03-30

## 2021-03-16 MED ORDER — ALUM-MAG HYDROXIDE-SIMETH 200 MG-200 MG-20 MG/5 ML ORAL SUSP
200-200-20 mg/5 mL | ORAL | 0 refills | Status: AC | PRN
Start: 2021-03-16 — End: 2021-03-21

## 2021-03-16 MED ORDER — SITAGLIPTIN 100 MG TAB
100 mg | ORAL_TABLET | Freq: Every day | ORAL | 2 refills | Status: AC
Start: 2021-03-16 — End: ?

## 2021-03-16 MED ORDER — ONDANSETRON 4 MG TAB, RAPID DISSOLVE
4 mg | ORAL_TABLET | Freq: Three times a day (TID) | ORAL | 0 refills | Status: AC | PRN
Start: 2021-03-16 — End: 2021-03-21

## 2021-03-16 MED ORDER — LANCETS
11 refills | Status: AC
Start: 2021-03-16 — End: ?

## 2021-03-16 MED ORDER — INSULIN NEEDLES (DISPOSABLE) 31 X 5/16"
31 gauge x 5/16" | 11 refills | Status: AC
Start: 2021-03-16 — End: ?

## 2021-03-16 MED ORDER — BLOOD-GLUCOSE METER
Freq: Every day | 0 refills | Status: AC
Start: 2021-03-16 — End: ?

## 2021-03-16 NOTE — Progress Notes (Signed)
Christy [name] is a 45 y.o. female (DOB: July 28, 1976) presenting to address:    Chief Complaint   Patient presents with   . Abdominal Pain     more so left side x2 months however now on right side as well        Vitals:    03/16/21 0811 03/16/21 0824   BP: (!) 169/114 (!) 172/120   Pulse: 99    Resp: 16    Temp: 98 F (36.7 C)    TempSrc: Temporal    SpO2: 100%    Weight: 230 lb 9.6 oz (104.6 kg)    Height: 5\' 1"  (1.549 m)    PainSc:   8    PainLoc: Abdomen        Hearing/Vision:   No exam data present    Learning Assessment:   No flowsheet data found.  Depression Screening:     3 most recent PHQ Screens 03/16/2021   PHQ Not Done -   Little interest or pleasure in doing things Not at all   Feeling down, depressed, irritable, or hopeless Not at all   Total Score PHQ 2 0   Trouble falling or staying asleep, or sleeping too much More than half the days   Feeling tired or having little energy Not at all   Poor appetite, weight loss, or overeating Not at all   Feeling bad about yourself - or that you are a failure or have let yourself or your family down Not at all   Trouble concentrating on things such as school, work, reading, or watching TV Not at all   Moving or speaking so slowly that other people could have noticed; or the opposite being so fidgety that others notice Not at all   Thoughts of being better off dead, or hurting yourself in some way Not at all   PHQ 9 Score 2   How difficult have these problems made it for you to do your work, take care of your home and get along with others Not difficult at all     Fall Risk Assessment:     Fall Risk Assessment, last 12 mths 10/22/2019   Able to walk? Yes   Fall in past 12 months? 0   Do you feel unsteady? 1   Are you worried about falling 1     Abuse Screening:     Abuse Screening Questionnaire 08/18/2020   Do you ever feel afraid of your partner? N   Are you in a relationship with someone who physically or mentally threatens you? N   Is it safe for you to go home? Y      ADL Assessment:   No flowsheet data found.     Coordination of Care Questionaire:   1. "Have you been to the ER, urgent care clinic since your last visit?  Hospitalized since your last visit?" No    2. "Have you seen or consulted any other health care providers outside of the [facility] since your last visit?" Yes When: 03/07/21 Where: Dr. Duanne Moron Reason for visit: Blood pressure     3. For patients aged 57-75: Has the patient had a colonoscopy / FIT/ Cologuard? NA - based on age      If the patient is female:    4. For patients aged 51-74: Has the patient had a mammogram within the past 2 years? Yes - no Care Gap present  See top three    5. For  patients aged 21-65: Has the patient had a pap smear? No    Advanced Directive:   1. Do you have an Advanced Directive? NO    2. Would you like information on Advanced Directives? NO

## 2021-03-16 NOTE — Progress Notes (Signed)
Christy [name] (DOB: 06-28-1976) is a 45 y.o. female, new to provider, here for:    ASSESSMENT/PLAN:    ICD-10-CM ICD-9-CM   1. Pain of upper abdomen  R10.10 789.09   2. Routine adult health maintenance  Z00.00 V70.0   3. Need for hepatitis C screening test  Z11.59 V73.89   4. Class 3 severe obesity due to excess calories with serious comorbidity and body mass index (BMI) of 40.0 to 44.9 in adult (HCC)  E66.01 278.01    Z68.41 V85.41   5. Essential hypertension  I10 401.9   6. Acquired hypothyroidism  E03.9 244.9   7. Type 2 diabetes mellitus without complication, with long-term current use of insulin (HCC)  E11.9 250.00    Z79.4 V58.67   8. Hyperlipidemia with target low density lipoprotein (LDL) cholesterol less than 70 mg/dL  E78.5 272.4   9. Gastroesophageal reflux disease, unspecified whether esophagitis present  K21.9 530.81   10. History of IBS  Z87.19 V12.79     #Upper abdominal pain - First started few months ago  #GERD - Reviewed behavioral and dietary changes to reduce GERD symptoms. Handout provided.   #Hx of IBS  #S/p cholecystectomy 2009  Established with GI? No interested in referral, ordered 03/16/21  Previously had colonoscopy >3years ago in [place], small polyps removed; was told to return in 1 year but lost insurance.   Plan to start GI cocktail (PPI, antiemetic, gas reducer, peppermint oil capsules) for dx/therapeutic purposes with close follow up to reevaluate   Lab workup ordered     Description of pain:   Left side towards front, feels like stabbing, Comes and goes  Has been getting worse and now crossing into R side  Has BM every day , adequate, not hard  Pain worse with BM, after BM has some improvement on occasion  No change in pain with eating   Worse with sitting up and walking   Most comfortable when flat on back  Occasional nausea, Some bloating  Worse when sensation of having to pee     Rec review Nov 2020 CT Abd/Pelv:   Liver: ??Hepatosteatosis.   Gallbladder: ??Removed.   Adrenal glands:  ??Left adrenal gland with an ovoid low-attenuation mass measuring about 3.8 x 2.3 cm (axial #26). ??Favor adrenal adenoma.   Spleen, Pancreas, Kidneys: ??Unremarkable.   GI Tract: ??Unremarkable stomach and small bowel. ??The appendix is normal. ??No acute colitis or acute diverticulitis is detected.   Uterus: ??Removed.   Adnexa: ??3.8 x 2.6 x 4.4 cm left ovary. ??4.5 x 1.8 x 3.0 cm right ovary.     #NIDDM2  Glucometer? Broken, needs new one.   Hypoglycemia? Denies  Tolerating medications without notable AE  Lab Results   Component Value Date/Time    Hemoglobin A1c 8.8 (H) 03/16/2021 09:04 AM    Hemoglobin A1c 8.2 (H) 10/22/2019 12:00 AM     Key Antihyperglycemic Medications             SITagliptin (Januvia) 100 mg tablet (Taking) Take 1 Tablet by mouth daily. Indications: type 2 diabetes mellitus    liraglutide (Victoza 3-Pak) 0.6 mg/0.1 mL (18 mg/3 mL) pnij (Taking) ADMINISTER 1.8MG SUBCUTANEOUSLY ONCE A DAY  Indications: type 2 diabetes mellitus    metFORMIN (GLUCOPHAGE) 1,000 mg tablet (Taking) Take 1 Tablet by mouth two (2) times daily (with meals) for 90 days.    insulin glargine (Lantus Solostar U-100 Insulin) 100 unit/mL (3 mL) inpn (Taking) Inject 10 units subcutaneously every night at  bedtime.        #HLD - Goal < 70  Tolerating medications without notable AE, will continue regimen unchanged    Lab Results   Component Value Date/Time    LDL-CHOLESTEROL 89 10/22/2019 12:00 AM    LDL, calculated 184 (H) 03/16/2021 09:04 AM     The 10-year ASCVD risk score Mikey Bussing DC Jr., et al., 2013) is: 43.5%    Values used to calculate the score:      Age: 51 years      Sex: Female      Is Non-Hispanic African American: Yes      Diabetic: Yes      Tobacco smoker: Yes      Systolic Blood Pressure: 607 mmHg      Is BP treated: Yes      HDL Cholesterol: 56 MG/DL      Total Cholesterol: 272 MG/DL    #HTN - uncontrolled, suspect secondary to pain; plan for close F/U to recheck   BP cuff at home? Yes  Reviewed goals of BP and encouraged  to monitor and if blood pressure measurements at home are close to or higher than 140/90 frequently then we should make adjustments to medication regimen. Reviewed risk of stroke or cardiovascular changes with persistently elevated BP.    BP Readings from Last 3 Encounters:   03/16/21 (!) 172/120   03/07/21 124/70   01/26/21 (!) 173/103     Key CAD CHF Meds             olmesartan (BENICAR) 40 mg tablet (Taking) Take 1 Tablet by mouth daily.    cloNIDine HCL (CATAPRES) 0.3 mg tablet (Taking) Take 1 Tablet by mouth two (2) times a day.    aspirin delayed-release 81 mg tablet (Taking) 81 mg.    metoprolol tartrate (LOPRESSOR) 100 mg IR tablet Take 1 Tablet by mouth two (2) times a day.    indapamide (LOZOL) 1.25 mg tablet TAKE 1 TABLET BY MOUTH EVERY DAY    atorvastatin (Lipitor) 20 mg tablet Take 1 Tablet by mouth daily for 90 days.    labetaloL (NORMODYNE) 200 mg tablet Take 1 Tablet by mouth two (2) times a day for 90 days. TWICE A DAY        #Hypothyroidism  Thyroid antibodies? Never done  Thyroid U/S? Never done  Unclear control, will check levels    Orders Placed This Encounter   ??? HEMOGLOBIN A1C WITH EAG     Standing Status:   Future     Number of Occurrences:   1     Standing Expiration Date:   03/16/2022   ??? CBC WITH AUTOMATED DIFF     Standing Status:   Future     Number of Occurrences:   1     Standing Expiration Date:   3/71/0626   ??? METABOLIC PANEL, COMPREHENSIVE     Standing Status:   Future     Number of Occurrences:   1     Standing Expiration Date:   03/16/2022   ??? T4, FREE     Standing Status:   Future     Number of Occurrences:   1     Standing Expiration Date:   03/16/2022   ??? LIPID PANEL     Standing Status:   Future     Number of Occurrences:   1     Standing Expiration Date:   03/16/2022   ??? TSH 3RD GENERATION     Standing  Status:   Future     Number of Occurrences:   1     Standing Expiration Date:   03/16/2022   ??? VITAMIN D, 25 HYDROXY     Standing Status:   Future     Number of Occurrences:   1      Standing Expiration Date:   03/16/2022   ??? HEPATITIS C AB     Standing Status:   Future     Number of Occurrences:   1     Standing Expiration Date:   4/00/8676   ??? CYCLIC CITRUL PEPTIDE AB, IGG     Standing Status:   Future     Number of Occurrences:   1     Standing Expiration Date:   03/16/2022   ??? SED RATE (ESR)     Standing Status:   Future     Number of Occurrences:   1     Standing Expiration Date:   03/16/2022   ??? C REACTIVE PROTEIN, QT     Standing Status:   Future     Number of Occurrences:   1     Standing Expiration Date:   03/16/2022   ??? REFERRAL TO GASTROENTEROLOGY     Referral Priority:   Routine     Referral Type:   Consultation     Referral Reason:   Specialty Services Required     Number of Visits Requested:   1   ??? traZODone (DESYREL) 50 mg tablet     Sig: TAKE 1 TABLET BY MOUTH AT BEDTIME AS NEEDED FOR SLEEP   ??? hydrOXYzine pamoate (VISTARIL) 50 mg capsule     Sig: TAKE 1 CAPSULE BY MOUTH 4 TIMES A DAY AS NEEDED FOR ANXIETY   ??? omeprazole (PRILOSEC) 40 mg capsule     Sig: Take 1 Capsule by mouth daily for 14 days. After 14 days, can continue with 1 capsule by mouth daily as needed.     Dispense:  30 Capsule     Refill:  1   ??? alum-mag hydroxide-simeth (MYLANTA) 200-200-20 mg/5 mL susp     Sig: Take 30 mL by mouth every four (4) hours as needed for GI Pain for up to 5 days.     Dispense:  1 Each     Refill:  0   ??? ondansetron (ZOFRAN ODT) 4 mg disintegrating tablet     Sig: Take 1 Tablet by mouth every eight (8) hours as needed for Nausea or Vomiting for up to 5 days.     Dispense:  15 Tablet     Refill:  0   ??? glucose blood VI test strips (True Metrix Glucose Test Strip) strip     Sig: Use to test blood sugar once daily     Dispense:  100 Strip     Refill:  11   ??? lancets misc     Sig: Use to test blood sugar once daily     Dispense:  1 Each     Refill:  11   ??? Blood-Glucose Meter (True Metrix Glucose Meter) misc     Sig: 1 Kit by Does Not Apply route daily.     Dispense:  1 Each     Refill:  0   ???  Insulin Needles, Disposable, 31 gauge x 5/16" ndle     Sig: Use twice daily for insulin and victoza injections.     Dispense:  100 Each     Refill:  11   ??? SITagliptin (Januvia) 100 mg tablet     Sig: Take 1 Tablet by mouth daily. Indications: type 2 diabetes mellitus     Dispense:  90 Tablet     Refill:  2     Return in about 8 days (around 03/24/2021) for 30 min follow up, TOC.    SUBJECTIVE/OBJECTIVE:  Extensive review of medical history and medications completed to facilitate transfer of care.     Current Outpatient Medications   Medication Sig   ??? traZODone (DESYREL) 50 mg tablet TAKE 1 TABLET BY MOUTH AT BEDTIME AS NEEDED FOR SLEEP   ??? hydrOXYzine pamoate (VISTARIL) 50 mg capsule TAKE 1 CAPSULE BY MOUTH 4 TIMES A DAY AS NEEDED FOR ANXIETY   ??? omeprazole (PRILOSEC) 40 mg capsule Take 1 Capsule by mouth daily for 14 days. After 14 days, can continue with 1 capsule by mouth daily as needed.   ??? alum-mag hydroxide-simeth (MYLANTA) 200-200-20 mg/5 mL susp Take 30 mL by mouth every four (4) hours as needed for GI Pain for up to 5 days.   ??? ondansetron (ZOFRAN ODT) 4 mg disintegrating tablet Take 1 Tablet by mouth every eight (8) hours as needed for Nausea or Vomiting for up to 5 days.   ??? glucose blood VI test strips (True Metrix Glucose Test Strip) strip Use to test blood sugar once daily   ??? lancets misc Use to test blood sugar once daily   ??? Blood-Glucose Meter (True Metrix Glucose Meter) misc 1 Kit by Does Not Apply route daily.   ??? Insulin Needles, Disposable, 31 gauge x 5/16" ndle Use twice daily for insulin and victoza injections.   ??? SITagliptin (Januvia) 100 mg tablet Take 1 Tablet by mouth daily. Indications: type 2 diabetes mellitus   ??? celecoxib (CELEBREX) 200 mg capsule Take 1 Capsule by mouth two (2) times daily as needed for Pain. Best tolerated if taken with food. Caution with long term regular use.   ??? Latuda 40 mg tab tablet Take 1 Tablet by mouth nightly.   ??? PARoxetine (PAXIL) 30 mg tablet Take 30  mg by mouth nightly.   ??? liraglutide (Victoza 3-Pak) 0.6 mg/0.1 mL (18 mg/3 mL) pnij ADMINISTER 1.8MG SUBCUTANEOUSLY ONCE A DAY  Indications: type 2 diabetes mellitus   ??? methocarbamoL (ROBAXIN) 750 mg tablet Take 1 Tablet by mouth four (4) times daily as needed for Muscle Spasm(s) or Pain.   ??? olmesartan (BENICAR) 40 mg tablet Take 1 Tablet by mouth daily.   ??? levothyroxine (SYNTHROID) 200 mcg tablet Take 1 Tablet by mouth Daily (before breakfast).   ??? buPROPion (WELLBUTRIN) 75 mg tablet Take 1 Tablet by mouth two (2) times a day.   ??? metFORMIN (GLUCOPHAGE) 1,000 mg tablet Take 1 Tablet by mouth two (2) times daily (with meals) for 90 days.   ??? cloNIDine HCL (CATAPRES) 0.3 mg tablet Take 1 Tablet by mouth two (2) times a day.   ??? insulin glargine (Lantus Solostar U-100 Insulin) 100 unit/mL (3 mL) inpn Inject 10 units subcutaneously every night at bedtime.   ??? ARIPiprazole (ABILIFY) 5 mg tablet Take 5 mg by mouth daily.   ??? aspirin delayed-release 81 mg tablet 81 mg.   ??? metoprolol tartrate (LOPRESSOR) 100 mg IR tablet Take 1 Tablet by mouth two (2) times a day. (Patient not taking: Reported on 03/16/2021)   ??? indapamide (LOZOL) 1.25 mg tablet TAKE 1 TABLET BY MOUTH EVERY DAY (Patient not taking: Reported on 03/16/2021)   ???  atorvastatin (Lipitor) 20 mg tablet Take 1 Tablet by mouth daily for 90 days. (Patient not taking: Reported on 03/16/2021)   ??? gabapentin (NEURONTIN) 100 mg capsule Take 1 Capsule by mouth three (3) times daily. Max Daily Amount: 300 mg. (Patient not taking: Reported on 03/07/2021)   ??? labetaloL (NORMODYNE) 200 mg tablet Take 1 Tablet by mouth two (2) times a day for 90 days. TWICE A DAY (Patient not taking: Reported on 11/30/2020)   ??? lamoTRIgine (LaMICtal) 25 mg tablet 50 mg. oNCE A DAY (Patient not taking: Reported on 03/16/2021)     No current facility-administered medications for this visit.       Visit Vitals  BP (!) 172/120 (BP 1 Location: Left upper arm, BP Patient Position: Sitting, BP Cuff  Size: Large adult)   Pulse 99   Temp 98 ??F (36.7 ??C) (Temporal)   Resp 16   Ht _0  (1.549 m)   Wt 230 lb 9.6 oz (104.6 kg)   SpO2 100%   BMI 43.57 kg/m??     Physical Exam  Vitals and nursing note reviewed.   Constitutional:       General: She is in acute distress.      Interventions: Face mask in place.   Cardiovascular:      Rate and Rhythm: Normal rate.      Pulses: Normal pulses.   Pulmonary:      Effort: Pulmonary effort is normal. No respiratory distress.   Abdominal:      General: Bowel sounds are normal. There is no distension.      Palpations: Abdomen is soft.   Neurological:      Mental Status: She is alert and oriented to person, place, and time.      Gait: Gait normal.   Psychiatric:         Mood and Affect: Mood normal.         Behavior: Behavior normal.       On this date 03/16/2021 I have spent 50 minutes reviewing previous notes, test results and face to face with the patient discussing the diagnosis and importance of compliance with the treatment plan as well as documenting on the day of the visit.    Fayne Norrie, DO  Family Medicine  03/16/2021

## 2021-03-17 LAB — SED RATE (ESR): Sed rate, automated: 29 mm/hr — ABNORMAL HIGH (ref 0–20)

## 2021-03-17 LAB — HEPATITIS C AB
Hep C virus Ab Interp.: NEGATIVE
Hepatitis C virus Ab: 0.06 Index (ref <0.80)

## 2021-03-17 LAB — SEDIMENTATION RATE: Sed Rate: 29 mm/hr — ABNORMAL HIGH (ref 0–20)

## 2021-03-17 LAB — HEPATITIS C ANTIBODY
HCV Ab: 0.06 Index (ref <0.80)
Hepatitis C Ab: NEGATIVE

## 2021-03-18 LAB — CYCLIC CITRUL PEPTIDE AB, IGG
CCP ANTIBODIES IGG/IGA: 7 units (ref 0–19)
CCP Antibodies IgG/IgA: 7 units (ref 0–19)

## 2021-03-18 NOTE — Progress Notes (Signed)
Will review in detail at follow up 5/26:   ESR/CRP mildly elevated; CCP negative, could add rheum panel  Vit B12 low, will offer B12 injection and continue PO supplement  A1C slightly increased, 8.8  LDL 184, The 10-year ASCVD risk score Mikey Bussing DC Jr., et al., 2013) is: 43.5% - should increase statin dose  Vit D low

## 2021-03-22 NOTE — Telephone Encounter (Signed)
Telephone Encounter by Rande Brunt, LPN at 28/41/32 1457                Author: Rande Brunt, LPN  Service: --  Author Type: Licensed Nurse       Filed: 03/22/21 1458  Encounter Date: 03/22/2021  Status: Signed          Editor: Rande Brunt, LPN (Licensed Nurse)               Received prior Berkley Harvey for Celebrex 200 mg capsule       Started auth on covermymed and sent to plan for questions request

## 2021-03-23 NOTE — Telephone Encounter (Signed)
Telephone Encounter by Rande Brunt, LPN at 10/62/69 1527                Author: Rande Brunt, LPN  Service: --  Author Type: Licensed Nurse       Filed: 03/23/21 1527  Encounter Date: 03/22/2021  Status: Signed          Editor: Rande Brunt, LPN (Licensed Nurse)               Questions received and answered. Sent back to plan.

## 2021-03-24 ENCOUNTER — Ambulatory Visit: Attending: Family Medicine | Primary: Family Medicine

## 2021-03-24 ENCOUNTER — Other Ambulatory Visit

## 2021-03-24 ENCOUNTER — Ambulatory Visit
Admit: 2021-03-24 | Discharge: 2021-03-24 | Payer: PRIVATE HEALTH INSURANCE | Attending: Family Medicine | Primary: Family Medicine

## 2021-03-24 ENCOUNTER — Encounter: Admit: 2021-03-24 | Discharge: 2021-03-24 | Payer: PRIVATE HEALTH INSURANCE | Primary: Family Medicine

## 2021-03-24 ENCOUNTER — Inpatient Hospital Stay: Admit: 2021-03-24 | Payer: PRIVATE HEALTH INSURANCE | Primary: Family Medicine

## 2021-03-24 DIAGNOSIS — R1010 Upper abdominal pain, unspecified: Secondary | ICD-10-CM

## 2021-03-24 DIAGNOSIS — R700 Elevated erythrocyte sedimentation rate: Secondary | ICD-10-CM

## 2021-03-24 LAB — C REACTIVE PROTEIN, QT: C-Reactive protein: <0.3 mg/dL (ref 0–0.3)

## 2021-03-24 LAB — FERRITIN
Ferritin: 114 NG/ML (ref 8–388)
Ferritin: 114 NG/ML (ref 8–388)

## 2021-03-24 LAB — LIPASE
Lipase: 185 U/L (ref 73–393)
Lipase: 185 U/L (ref 73–393)

## 2021-03-24 LAB — AMYLASE
Amylase: 45 U/L (ref 25–115)
Amylase: 45 U/L (ref 25–115)

## 2021-03-24 LAB — C-REACTIVE PROTEIN: CRP: <0.3 mg/dL (ref 0–0.3)

## 2021-03-24 MED ORDER — ATORVASTATIN 40 MG TAB
40 mg | ORAL_TABLET | Freq: Every evening | ORAL | 1 refills | Status: AC
Start: 2021-03-24 — End: 2021-10-19

## 2021-03-24 MED ORDER — DOCUSATE SODIUM 100 MG CAP
100 mg | ORAL_CAPSULE | Freq: Two times a day (BID) | ORAL | 2 refills | Status: AC
Start: 2021-03-24 — End: 2021-06-22

## 2021-03-24 MED ORDER — DAPAGLIFLOZIN 10 MG TABLET
10 mg | ORAL_TABLET | Freq: Every day | ORAL | 1 refills | Status: AC
Start: 2021-03-24 — End: ?

## 2021-03-24 MED ORDER — CYANOCOBALAMIN 1,000 MCG/ML IJ SOLN
1000 mcg/mL | Freq: Once | INTRAMUSCULAR | 0 refills | Status: AC
Start: 2021-03-24 — End: 2021-03-24

## 2021-03-24 NOTE — Progress Notes (Signed)
No additional comment

## 2021-03-24 NOTE — Progress Notes (Signed)
 Christy [name] is a 45 y.o. female (DOB: [birth year]) presenting to address:    Chief Complaint   Patient presents with   . Transfer Of Care   . Side Pain     bilateral side pain      Patient has been out of Januvia x 1 month she was not aware that she has a refill at pharmacy, she will call them today.     After obtaining consent, and per orders of Dr. Mylinda, injection of B12 1000 mcg given by Nat Makua, LPN. Patient instructed to remain in clinic for 20 minutes afterwards, and to report any adverse reaction to me immediately.    Vitals:    03/24/21 0840 03/24/21 0850   BP: (!) 151/101 (!) 154/101   Pulse: (!) 105    Resp: 16    Temp: 97.4 F (36.3 C)    TempSrc: Temporal    SpO2: 100%    Weight: 230 lb 3.2 oz (104.4 kg)    Height: 5' 1 (1.549 m)    PainSc:   9    PainLoc: Flank        Hearing/Vision:   No exam data present    Learning Assessment:   No flowsheet data found.  Depression Screening:     3 most recent PHQ Screens 03/24/2021   PHQ Not Done -   Little interest or pleasure in doing things Not at all   Feeling down, depressed, irritable, or hopeless Not at all   Total Score PHQ 2 0   Trouble falling or staying asleep, or sleeping too much Several days   Feeling tired or having little energy Not at all   Poor appetite, weight loss, or overeating Not at all   Feeling bad about yourself - or that you are a failure or have let yourself or your family down Not at all   Trouble concentrating on things such as school, work, reading, or watching TV Not at all   Moving or speaking so slowly that other people could have noticed; or the opposite being so fidgety that others notice Not at all   Thoughts of being better off dead, or hurting yourself in some way Not at all   PHQ 9 Score 1   How difficult have these problems made it for you to do your work, take care of your home and get along with others Not difficult at all     Fall Risk Assessment:     Fall Risk Assessment, last 12 mths 03/24/2021   Able to  walk? Yes   Fall in past 12 months? 0   Do you feel unsteady? 0   Are you worried about falling 0     Abuse Screening:     Abuse Screening Questionnaire 03/24/2021   Do you ever feel afraid of your partner? N   Are you in a relationship with someone who physically or mentally threatens you? N   Is it safe for you to go home? Y     ADL Assessment:   No flowsheet data found.     Coordination of Care Questionaire:   1. Have you been to the ER, urgent care clinic since your last visit?  Hospitalized since your last visit? No    2. Have you seen or consulted any other health care providers outside of the [facility] since your last visit? No     3. For patients aged 7-75: Has the patient had a colonoscopy /  FIT/ Cologuard? NA - based on age      If the patient is female:    4. For patients aged 79-74: Has the patient had a mammogram within the past 2 years? Yes - no Care Gap present  See top three    5. For patients aged 21-65: Has the patient had a pap smear? No    Advanced Directive:   1. Do you have an Advanced Directive? NO    2. Would you like information on Advanced Directives? NO

## 2021-03-24 NOTE — Progress Notes (Signed)
Christy [name] (DOB: 1976-01-11) is a 45 y.o. female, established patient, here for:    Hyperpigmented rash on B/L LE    ASSESSMENT/PLAN:    ICD-10-CM ICD-9-CM   1. Pain of upper abdomen  R10.10 789.09   2. B12 deficiency  E53.8 266.2   3. Hyperlipidemia with target low density lipoprotein (LDL) cholesterol less than 70 mg/dL  E78.5 272.4   4. Elevated sed rate  R70.0 790.1   5. Elevated C-reactive protein (CRP)  R79.82 790.95   6. Essential hypertension  I10 401.9   7. Acquired hypothyroidism  E03.9 244.9   8. Type 2 diabetes mellitus without complication, with long-term current use of insulin (HCC)  E11.9 250.00    Z79.4 V58.67   9. Class 3 severe obesity due to excess calories with serious comorbidity and body mass index (BMI) of 40.0 to 44.9 in adult (HCC)  E66.01 278.01    Z68.41 V85.41   10. Normocytic anemia  D64.9 285.9   11. Fatty liver  K76.0 571.8   12. Adrenal adenoma, left  D35.02 227.0   13. Gastroesophageal reflux disease, unspecified whether esophagitis present  K21.9 530.81   14. Financial difficulties  Z59.9 V60.2   15. History of IBS  Z87.19 V12.79     #ESR/CRP mildly elevated; CCP negative, will add rheum panel    #Normocytica anemia - new   Will add Ferritin  Reviewed diagnosis and supplement recommendations.   Lab Results   Component Value Date/Time    WBC 7.7 03/16/2021 09:04 AM    HGB 10.8 (L) 03/16/2021 09:04 AM    HCT 33.6 (L) 03/16/2021 09:04 AM    PLATELET 232 03/16/2021 09:04 AM    MCV 87.7 03/16/2021 09:04 AM     #Vit B 12 deficiency - reviewed dx and supplement recommendations  Will give IM B12 in office and recommended to continue with 1027m daily OTC.     #Vit D Def - reviewed dx and recommended supplement dosing    #Upper abdominal pain - First started few months ago and progressively worsening  #GERD - Reviewed behavioral and dietary changes to reduce GERD symptoms. Handout provided.   #Hx of IBS  #S/p cholecystectomy 2009  Established with GI? No, interested in referral, ordered  03/16/21; Cannot be seen before July 2022  Previously had colonoscopy >3years ago in [place], small polyps removed; was told to return in 1 year but lost insurance.   Started on GI cocktail PPI, antiemetic (could not afford), and gas reducer for dx/therapeutic purposes with worsening of symptoms   Lab workup unrevealing  Agreeable to update CT Abd/Pelv, ordered 03/24/21  ??  Description of pain:   Left side towards front, feels like stabbing, Comes and goes  Has been getting worse and now crossing into R side  Has BM every day , adequate, not hard  Pain worse with BM, after BM has some improvement on occasion  No change in pain with eating   Worse with sitting up and walking   Most comfortable when flat on back  Occasional nausea, Some bloating  Worse when sensation of having to pee   ??  Rec review Nov 2020 CT Abd/Pelv:   Liver: ??Hepatosteatosis.   Gallbladder: ??Removed.   Adrenal glands: ??Left adrenal gland with an ovoid low-attenuation mass measuring about 3.8 x 2.3 cm (axial #26). ??Favor adrenal adenoma.   Spleen, Pancreas, Kidneys: ??Unremarkable.   GI Tract: ??Unremarkable stomach and small bowel. ??The appendix is normal. ??No acute colitis or  acute diverticulitis is detected.   Uterus: ??Removed.   Adnexa: ??3.8 x 2.6 x 4.4 cm left ovary. ??4.5 x 1.8 x 3.0 cm right ovary.   ??  #NIDDM2 - uncontrolled   Glucometer? Ordered new one last visit.    Hypoglycemia? Denies  Tolerating medications without notable AE, except was not able to pick up Januvia (unclear why). Suspect coverage issue, Patient will inquire with pharmacy. Reviewed consideration of alternative of SGLT2 inhibitor and agreeable to add to treatment.           Lab Results   Component Value Date/Time   ?? Hemoglobin A1c 8.8 (H) 03/16/2021 09:04 AM   ?? Hemoglobin A1c 8.2 (H) 10/22/2019 12:00 AM   ??  Key Antihyperglycemic Medications             dapagliflozin (FARXIGA) 10 mg tab tablet (Taking) Take 1 Tablet by mouth daily.    liraglutide (Victoza 3-Pak) 0.6 mg/0.1 mL  (18 mg/3 mL) pnij (Taking) ADMINISTER 1.8MG SUBCUTANEOUSLY ONCE A DAY  Indications: type 2 diabetes mellitus    metFORMIN (GLUCOPHAGE) 1,000 mg tablet (Taking) Take 1 Tablet by mouth two (2) times daily (with meals) for 90 days.    insulin glargine (Lantus Solostar U-100 Insulin) 100 unit/mL (3 mL) inpn (Taking) Inject 10 units subcutaneously every night at bedtime.    SITagliptin (Januvia) 100 mg tablet Take 1 Tablet by mouth daily. Indications: type 2 diabetes mellitus        #HTN - uncontrolled, suspect secondary to pain; plan for close F/U to recheck   BP cuff at home? Yes  Reviewed goals of BP and encouraged to monitor and if??blood pressure measurements at home are close to or higher than 140/90 frequently then we should make adjustments to medication regimen. Reviewed risk of stroke or cardiovascular changes with persistently elevated BP.??   Tolerating medications without notable AE, meds managed by specialist    BP Readings from Last 3 Encounters:   03/24/21 (!) 154/101   03/16/21 (!) 172/120   03/07/21 124/70     #HLD - Goal < 70  Has been off Lipitor, Unclear why stopped; Agreeable to restart and increase to mod intensity 10m daily    Lab Results   Component Value Date/Time    LDL-CHOLESTEROL 89 10/22/2019 12:00 AM    LDL, calculated 184 (H) 03/16/2021 09:04 AM     The 10-year ASCVD risk score (Mikey BussingDC Jr., et al., 2013) is: 28.1%    Values used to calculate the score:      Age: [age]years      Sex: Female      Is Non-Hispanic African American: Yes      Diabetic: Yes      Tobacco smoker: Yes      Systolic Blood Pressure: 1416mmHg      Is BP treated: Yes      HDL Cholesterol: 56 MG/DL      Total Cholesterol: 272 MG/DL    Key CAD CHF Meds             atorvastatin (Lipitor) 40 mg tablet (Taking) Take 1 Tablet by mouth nightly for 90 days. Indications: high cholesterol    olmesartan (BENICAR) 40 mg tablet (Taking) Take 1 Tablet by mouth daily.    cloNIDine HCL (CATAPRES) 0.3 mg tablet (Taking) Take 1 Tablet by  mouth two (2) times a day.    aspirin delayed-release 81 mg tablet (Taking) 81 mg.        #Hypothyroidism - well  controlled   Thyroid antibodies? Will add to labs  Thyroid U/S? Never done  Tolerating medications without notable AE, will continue regimen unchanged    Lab Results   Component Value Date/Time    TSH 3.71 03/16/2021 09:04 AM    T4, Free 1.1 03/16/2021 09:04 AM     Orders Placed This Encounter   ??? THER/PROPH/DIAG INJ, SC/IM ([id number])   ??? CT ABD PELV W CONT     Standing Status:   Future     Standing Expiration Date:   04/24/2022     Scheduling Instructions:      Carmin Muskrat to compare to last CT Abd/Pelv 09/16/19     Order Specific Question:   Is Patient Pregnant?     Answer:   No     Order Specific Question:   STAT Creatinine as indicated     Answer:   Yes     Order Specific Question:   This order utilizes IV contrast.  What additional contrast is needed?     Answer:   Per Radiologist Protocol   ??? C REACTIVE PROTEIN, QT     Standing Status:   Future     Standing Expiration Date:   03/24/2022   ??? ANA COMPREHENSIVE PLUS PANEL     Standing Status:   Future     Standing Expiration Date:   03/25/2022   ??? RHEUMATOID FACTOR, IGM     Standing Status:   Future     Standing Expiration Date:   01/04/6577   ??? CYCLIC CITRUL PEPTIDE AB, IGG     Standing Status:   Future     Standing Expiration Date:   03/24/2022   ??? SED RATE (ESR)     Standing Status:   Future     Standing Expiration Date:   03/24/2022   ??? AMYLASE     Standing Status:   Future     Standing Expiration Date:   03/25/2022   ??? FERRITIN     Standing Status:   Future     Standing Expiration Date:   03/25/2022   ??? LIPASE     Standing Status:   Future     Standing Expiration Date:   03/25/2022   ??? THYROID ANTIBODY PANEL     Standing Status:   Future     Standing Expiration Date:   03/24/2022   ??? VITAMIN B12 INJECTION (J3420)     Order Specific Question:   Charge Quantity?     Answer:   1     Order Specific Question:   Dose     Answer:   1000 mcg     Order Specific  Question:   Site     Answer:   RIGHT DELTOID     Order Specific Question:   Expiration Date     Answer:   01/27/2022     Order Specific Question:   Lot#     Answer:   [id number]     Order Specific Question:   Manufacturer     Answer:   [employer]     Order Specific Question:   Perfomed by/Witnessed by:     Answer:   Marianna Payment, LPN     Order Specific Question:   NDC#     Answer:   [phone] [[id number]]   ??? cyanocobalamin (Vitamin B-12) 1,000 mcg/mL injection     Sig: 1 mL by IntraMUSCular route once for 1 dose.     Dispense:  1 mL  Refill:  0   ??? dapagliflozin (FARXIGA) 10 mg tab tablet     Sig: Take 1 Tablet by mouth daily.     Dispense:  90 Tablet     Refill:  1   ??? atorvastatin (Lipitor) 40 mg tablet     Sig: Take 1 Tablet by mouth nightly for 90 days. Indications: high cholesterol     Dispense:  90 Tablet     Refill:  1   ??? docusate sodium (COLACE) 100 mg capsule     Sig: Take 1 Capsule by mouth two (2) times a day for 90 days.     Dispense:  60 Capsule     Refill:  2     Return in about 3 weeks (around 04/14/2021) for 45 min follow up, lab review.    SUBJECTIVE/OBJECTIVE:  Last PCP visit: 03/16/2021    Since last visit:   Any changes in health, procedures, hospital visits, or specialist visits? See A/P  Tolerating medications without adverse reactions? Reports good compliance with Rx without notable adverse effects.  (some meds not able to afford, list updated)    Current Outpatient Medications   Medication Sig   ??? cyanocobalamin (Vitamin B-12) 1,000 mcg/mL injection 1 mL by IntraMUSCular route once for 1 dose.   ??? dapagliflozin (FARXIGA) 10 mg tab tablet Take 1 Tablet by mouth daily.   ??? atorvastatin (Lipitor) 40 mg tablet Take 1 Tablet by mouth nightly for 90 days. Indications: high cholesterol   ??? docusate sodium (COLACE) 100 mg capsule Take 1 Capsule by mouth two (2) times a day for 90 days.   ??? traZODone (DESYREL) 50 mg tablet TAKE 1 TABLET BY MOUTH AT BEDTIME AS NEEDED FOR SLEEP   ??? hydrOXYzine  pamoate (VISTARIL) 50 mg capsule TAKE 1 CAPSULE BY MOUTH 4 TIMES A DAY AS NEEDED FOR ANXIETY   ??? omeprazole (PRILOSEC) 40 mg capsule Take 1 Capsule by mouth daily for 14 days. After 14 days, can continue with 1 capsule by mouth daily as needed.   ??? glucose blood VI test strips (True Metrix Glucose Test Strip) strip Use to test blood sugar once daily   ??? lancets misc Use to test blood sugar once daily   ??? Blood-Glucose Meter (True Metrix Glucose Meter) misc 1 Kit by Does Not Apply route daily.   ??? Insulin Needles, Disposable, 31 gauge x 5/16" ndle Use twice daily for insulin and victoza injections.   ??? Latuda 40 mg tab tablet Take 1 Tablet by mouth nightly.   ??? PARoxetine (PAXIL) 30 mg tablet Take 30 mg by mouth nightly.   ??? liraglutide (Victoza 3-Pak) 0.6 mg/0.1 mL (18 mg/3 mL) pnij ADMINISTER 1.8MG SUBCUTANEOUSLY ONCE A DAY  Indications: type 2 diabetes mellitus   ??? methocarbamoL (ROBAXIN) 750 mg tablet Take 1 Tablet by mouth four (4) times daily as needed for Muscle Spasm(s) or Pain.   ??? olmesartan (BENICAR) 40 mg tablet Take 1 Tablet by mouth daily.   ??? levothyroxine (SYNTHROID) 200 mcg tablet Take 1 Tablet by mouth Daily (before breakfast).   ??? buPROPion (WELLBUTRIN) 75 mg tablet Take 1 Tablet by mouth two (2) times a day.   ??? metFORMIN (GLUCOPHAGE) 1,000 mg tablet Take 1 Tablet by mouth two (2) times daily (with meals) for 90 days.   ??? cloNIDine HCL (CATAPRES) 0.3 mg tablet Take 1 Tablet by mouth two (2) times a day.   ??? insulin glargine (Lantus Solostar U-100 Insulin) 100 unit/mL (3 mL) inpn Inject 10  units subcutaneously every night at bedtime.   ??? aspirin delayed-release 81 mg tablet 81 mg.   ??? SITagliptin (Januvia) 100 mg tablet Take 1 Tablet by mouth daily. Indications: type 2 diabetes mellitus (Patient not taking: Reported on 03/24/2021)     No current facility-administered medications for this visit.       Visit Vitals  BP (!) 154/101 (BP 1 Location: Left upper arm, BP Patient Position: Sitting, BP Cuff  Size: Large adult)   Pulse (!) 105   Temp 97.4 ??F (36.3 ??C) (Temporal)   Resp 16   Ht 5' 1"  (1.549 m)   Wt 230 lb 3.2 oz (104.4 kg)   SpO2 100%   BMI 43.50 kg/m??     Physical Exam  Vitals and nursing note reviewed.   Constitutional:       General: She is in acute distress.      Interventions: Face mask in place.   Cardiovascular:      Rate and Rhythm: Normal rate.      Pulses: Normal pulses.   Pulmonary:      Effort: Pulmonary effort is normal. No respiratory distress.   Abdominal:      General: Bowel sounds are normal. There is no distension.      Palpations: Abdomen is soft.   Neurological:      Mental Status: She is alert and oriented to person, place, and time.      Gait: Gait normal.   Psychiatric:         Mood and Affect: Mood normal.         Behavior: Behavior normal.           Fayne Norrie, DO  Family Medicine  03/24/2021

## 2021-03-24 NOTE — Telephone Encounter (Signed)
Received denial:    After reading the information that was sent to [place], it has been decided that coverage for the service requested is denied. The reason for the denial is based on a review of our Celecoxib Guideline. Your doctor asked [employer] [place] to cover the drug, Celecoxib. The health plan will not approve the request. You have chest pain.    Your doctor must provide documentation for one of the following:    1) You have tried 3 formulary oral nonsteroidal anti-inflammatory drugs (NSAIDs) such as Meloxicam, Diclofenac, Naproxen, Ibuprofen, Ketorolac.    2) You are currently on a blood thinner, proton pump inhibitor, or histamine (H2) blocker.    3) You have a previous history of a gastrointestinal (GI) bleed or peptic ulcer disease.    Please list all relative medications you have tried or are currently on.

## 2021-03-25 LAB — ANA COMPREHENSIVE PLUS PANEL
Anti Ribosomal P Ab: <0.2 AI (ref 0.0–0.9)
Anti-DNA (DS) Ab, QT: 1 IU/mL (ref 0–9)
Anti-DNA (DS) Ab, QT: 1 IU/mL (ref 0–9)
Anti-Jo-1: <0.2 AI (ref 0.0–0.9)
Anti-Jo-1: <0.2 AI (ref 0.0–0.9)
Antichromatin Ab: <0.2 AI (ref 0.0–0.9)
Antichromatin Antibodies: <0.2 AI (ref 0.0–0.9)
Antiribosomal P Antibodies: <0.2 AI (ref 0.0–0.9)
Antiscleroderma-70 Antibodies: <0.2 AI (ref 0.0–0.9)
Centromere B Ab: <0.2 AI (ref 0.0–0.9)
Centromere B Antibody: <0.2 AI (ref 0.0–0.9)
RNP ABS: <0.2 AI (ref 0.0–0.9)
RNP Abs: <0.2 AI (ref 0.0–0.9)
SMITH/RNP ANTIBODIES, 016376: <0.2 AI (ref 0.0–0.9)
Scleroderma-70 Ab: <0.2 AI (ref 0.0–0.9)
Sjogren's Anti-SS-A: <0.2 AI (ref 0.0–0.9)
Sjogren's Anti-SS-A: <0.2 AI (ref 0.0–0.9)
Sjogren's Anti-SS-B: <0.2 AI (ref 0.0–0.9)
Sjogren's Anti-SS-B: <0.2 AI (ref 0.0–0.9)
Smith Abs: <0.2 AI (ref 0.0–0.9)
Smith Abs: <0.2 AI (ref 0.0–0.9)
Smith/RNP Abs: <0.2 AI (ref 0.0–0.9)

## 2021-03-25 LAB — SED RATE (ESR): Sed rate, automated: 23 mm/hr — ABNORMAL HIGH (ref 0–20)

## 2021-03-25 LAB — SEDIMENTATION RATE: Sed Rate: 23 mm/hr — ABNORMAL HIGH (ref 0–20)

## 2021-03-26 LAB — THYROID ANTIBODY PANEL
Thyroglobulin Ab: <1 IU/mL (ref 0.0–0.9)
Thyroid peroxidase Ab: <8 IU/mL (ref 0–34)

## 2021-03-26 LAB — CYCLIC CITRUL PEPTIDE AB, IGG
CCP ANTIBODIES IGG/IGA: 6 units (ref 0–19)
CCP Antibodies IgG/IgA: 6 units (ref 0–19)

## 2021-03-26 LAB — THYROID ANTIBODIES
Thyroglobulin Ab: <1 IU/mL (ref 0.0–0.9)
Thyroid Peroxidase Antibody: <8 IU/mL (ref 0–34)

## 2021-03-29 ENCOUNTER — Encounter

## 2021-03-30 ENCOUNTER — Inpatient Hospital Stay: Admit: 2021-03-30 | Payer: PRIVATE HEALTH INSURANCE | Attending: Family Medicine | Primary: Family Medicine

## 2021-03-30 DIAGNOSIS — R1010 Upper abdominal pain, unspecified: Secondary | ICD-10-CM

## 2021-03-30 LAB — CREATININE, POC
Creatinine, POC: 1.1 MG/DL (ref 0.6–1.3)
GFRAA, POC: >60 mL/min/{1.73_m2} (ref >60)
GFRNA, POC: 54 mL/min/{1.73_m2} — ABNORMAL LOW (ref >60)

## 2021-03-30 LAB — AMB POC CREATININE
Creatinine, POC: 1.1 MG/DL (ref 0.6–1.3)
GFR African American: >60 mL/min/{1.73_m2} (ref >60)
GFR Non-African American: 54 mL/min/{1.73_m2} — ABNORMAL LOW (ref >60)

## 2021-03-30 MED ORDER — IOPAMIDOL 61 % IV SOLN
30061 mg iodine /mL (61 %) | Freq: Once | INTRAVENOUS | Status: AC
Start: 2021-03-30 — End: 2021-03-30
  Administered 2021-03-30: 13:00:00 via INTRAVENOUS

## 2021-03-30 MED ORDER — DIATRIZOATE MEGLUMINE & SODIUM 66 %-10 % ORAL SOLN
66-10 % | Freq: Once | ORAL | Status: AC
Start: 2021-03-30 — End: 2021-03-30
  Administered 2021-03-30: 13:00:00 via ORAL

## 2021-03-30 MED FILL — MD-GASTROVIEW 66 %-10 % ORAL SOLUTION: 66-10 % | ORAL | Qty: 30

## 2021-03-30 MED FILL — ISOVUE-300  61 % INTRAVENOUS SOLUTION: 300 mg iodine /mL (61 %) | INTRAVENOUS | Qty: 100

## 2021-03-30 NOTE — Telephone Encounter (Signed)
Telephone Encounter by Rande Brunt, LPN at 41/74/08 0831                Author: Rande Brunt, LPN  Service: --  Author Type: Licensed Nurse       Filed: 03/30/21 0831  Encounter Date: 03/30/2021  Status: Signed          Editor: Rande Brunt, LPN (Licensed Nurse)          From: Debby Bud MasonTo: Reyes Ivan, DOSent: 03/30/2021  7:52 AM EDTSubject: Question regarding C REACTIVE PROTEIN, QTSo does this mean I definitely  do not have auto immune? Cause it doesnt seem like my test results have changed to much from the last lab work so therefore Im truly confused as I compare the lab work. Im still in alot of pain and have no comfort at all . Please  help me.

## 2021-03-31 LAB — RHEUMATOID FACTOR, IGM: Rheumatoid factor, IgM: <7 U (ref <7)

## 2021-04-01 NOTE — Progress Notes (Signed)
Constipation  Mild hepatic steatosis  L adrenal adenoma 3.6 x 2.3 cm low attenuation 21 hounsfield unit internally postcontrast

## 2021-04-14 ENCOUNTER — Ambulatory Visit: Attending: Family Medicine | Primary: Family Medicine

## 2021-04-14 ENCOUNTER — Ambulatory Visit
Admit: 2021-04-14 | Discharge: 2021-04-14 | Payer: PRIVATE HEALTH INSURANCE | Attending: Family Medicine | Primary: Family Medicine

## 2021-04-14 DIAGNOSIS — K5900 Constipation, unspecified: Secondary | ICD-10-CM

## 2021-04-14 NOTE — Progress Notes (Signed)
Christy [name] (DOB: 1976-01-25) is a 45 y.o. female, established patient, here for:    ASSESSMENT/PLAN:    ICD-10-CM ICD-9-CM   1. Constipation, unspecified constipation type  K59.00 564.00   2. Dehydration  E86.0 276.51   3. B12 deficiency  E53.8 266.2   4. Hyperlipidemia with target low density lipoprotein (LDL) cholesterol less than 70 mg/dL  E78.5 272.4   5. Elevated sed rate  R70.0 790.1   6. Elevated C-reactive protein (CRP)  R79.82 790.95   7. Essential hypertension  I10 401.9   8. Acquired hypothyroidism  E03.9 244.9   9. Type 2 diabetes mellitus without complication, with long-term current use of insulin (HCC)  E11.9 250.00    Z79.4 V58.67   10. Class 3 severe obesity due to excess calories with serious comorbidity and body mass index (BMI) of 40.0 to 44.9 in adult (HCC)  E66.01 278.01    Z68.41 V85.41   11. Pain of upper abdomen  R10.10 789.09   12. Fatty liver  K76.0 571.8     #ESR/CRP mildly elevated - Resolved with repeat, Full rheum panel normal 03/24/21    #Normocytica anemia - new, ferritin at goal  Reviewed diagnosis and supplement recommendations.         Lab Results   Component Value Date/Time   ?? WBC 7.7 03/16/2021 09:04 AM   ?? HGB 10.8 (L) 03/16/2021 09:04 AM   ?? HCT 33.6 (L) 03/16/2021 09:04 AM   ?? PLATELET 232 03/16/2021 09:04 AM   ?? MCV 87.7 03/16/2021 09:04 AM   ??  Lab Results   Component Value Date/Time    Ferritin 114 03/24/2021 09:51 AM     #Vit B 12 deficiency - reviewed dx and supplement recommendations  Will give IM B12 in office and recommended to continue with 1040m daily OTC.   ??  #Vit D Def - reviewed dx and recommended supplement dosing  ??  #Upper abdominal pain -??First started few months ago and progressively worsening  #GERD??-??Reviewed behavioral and dietary changes to reduce??GERD??symptoms. Handout provided.   #Hx of IBS  #S/p cholecystectomy 2009  Suspect #constipation worsened by #dehydration contributing to above  Established with GI? No, interested in referral, ordered  03/16/21; Cannot be seen before July 2022  Previously had colonoscopy >3years ago in [place], small polyps removed; was told to return in 1 year but lost insurance.??  Lab workup unrevealing  Rec review CT Abd/Pelv 03/30/21 with moderate stool burden, mild hepatic steatosis    Description of pain:??  Left??side??towards??front, feels like stabbing,??Comes and goes  Has been getting worse and now??crossing into??R side  Has BM every day , adequate, not hard  Pain??worse??with BM, after BM??has??some improvement on occasion  No change in pain with eating   Worse with sitting up and walking   Most comfortable when flat on back  Occasional nausea,??Some bloating  Worse when sensation of having to pee   ??  #NIDDM2 - uncontrolled, suspect improved  Glucometer? Ordered new one last visit. ??  Hypoglycemia? Denies  Tolerating medications without notable AE  ??  Lab Results   Component Value Date/Time    Hemoglobin A1c 8.8 (H) 03/16/2021 09:04 AM    Hemoglobin A1c 8.2 (H) 10/22/2019 12:00 AM    Glucose 203 (H) 03/16/2021 09:04 AM    Glucose POC 125 12/27/2011 11:56 AM    Microalbumin/Creat ratio (mg/g creat) 7 08/26/2020 08:22 AM    Microalbumin,urine random 1.15 08/26/2020 08:22 AM    LDL-CHOLESTEROL 89 10/22/2019 12:00  AM    LDL, calculated 184 (H) 03/16/2021 09:04 AM    Creatinine, POC 1.1 03/30/2021 08:34 AM    Creatinine 0.92 03/16/2021 09:04 AM     Key Antihyperglycemic Medications             dapagliflozin (FARXIGA) 10 mg tab tablet (Taking) Take 1 Tablet by mouth daily.    SITagliptin (Januvia) 100 mg tablet (Taking) Take 1 Tablet by mouth daily. Indications: type 2 diabetes mellitus    liraglutide (Victoza 3-Pak) 0.6 mg/0.1 mL (18 mg/3 mL) pnij (Taking) ADMINISTER 1.8MG SUBCUTANEOUSLY ONCE A DAY  Indications: type 2 diabetes mellitus    insulin glargine (Lantus Solostar U-100 Insulin) 100 unit/mL (3 mL) inpn (Taking) Inject 10 units subcutaneously every night at bedtime.    metFORMIN (GLUCOPHAGE) 1,000 mg tablet Take 1 Tablet by mouth two  (2) times daily (with meals) for 90 days.        #HTN -??uncontrolled, suspect secondary to pain  BP cuff at home???Yes, report home average closer to 120-130/80  Reviewed goals of BP and encouraged to monitor and if??blood pressure measurements at home are close to or higher than 140/90 frequently then we should make adjustments to medication regimen. Reviewed risk of stroke or cardiovascular changes with persistently elevated BP.????  Tolerating medications without notable AE, meds managed by specialist  ??  BP Readings from Last 3 Encounters:   04/14/21 (!) 165/116   03/24/21 (!) 154/101   03/16/21 (!) 172/120     #HLD - Goal < 70  Has been off Lipitor, Unclear why stopped; Agreeable to restart and increase to mod intensity 44m daily  ??        Lab Results   Component Value Date/Time   ?? LDL-CHOLESTEROL 89 10/22/2019 12:00 AM   ?? LDL, calculated 184 (H) 03/16/2021 09:04 AM   ??  The 10-year ASCVD risk score (Mikey BussingDC JBrooke Bonito, et al., 2013) is: 28.1%    Values used to calculate the score:      Age: [age]years      Sex: Female      Is Non-Hispanic African American: Yes      Diabetic: Yes      Tobacco smoker: Yes      Systolic Blood Pressure: 1295mmHg      Is BP treated: Yes      HDL Cholesterol: 56 MG/DL      Total Cholesterol: 272 MG/DL  ??  Key CAD CHF Meds             olmesartan (BENICAR) 40 mg tablet (Taking) Take 1 Tablet by mouth daily.    cloNIDine HCL (CATAPRES) 0.3 mg tablet (Taking) Take 1 Tablet by mouth two (2) times a day.    aspirin delayed-release 81 mg tablet (Taking) 81 mg.    atorvastatin (Lipitor) 40 mg tablet Take 1 Tablet by mouth nightly for 90 days. Indications: high cholesterol        #Acquired Hypothyroidism - well controlled   Thyroid antibodies? Negative May 2022  Thyroid U/S? Never done  Tolerating medications without notable AE, will continue regimen unchanged  ??        Lab Results   Component Value Date/Time   ?? TSH 3.71 03/16/2021 09:04 AM   ?? T4, Free 1.1 03/16/2021 09:04 AM     #Body mass index is  43.27 kg/m??.  Counseled on diet and exercise methods. Positive reinforcement provided.     Wt Readings from Last 3 Encounters:   04/14/21 229  lb (103.9 kg)   03/24/21 230 lb 3.2 oz (104.4 kg)   03/16/21 230 lb 9.6 oz (104.6 kg)     No orders of the defined types were placed in this encounter.    Return in about 4 months (around 08/14/2021) for 30 min follow up, POC A1C .    SUBJECTIVE/OBJECTIVE:  Last PCP visit: 03/24/2021    Since last visit:   Any changes in health, procedures, hospital visits, or specialist visits? Denies  Tolerating medications without adverse reactions? Reports good compliance with Rx without notable adverse effects.      Current Outpatient Medications   Medication Sig   ??? dapagliflozin (FARXIGA) 10 mg tab tablet Take 1 Tablet by mouth daily.   ??? docusate sodium (COLACE) 100 mg capsule Take 1 Capsule by mouth two (2) times a day for 90 days.   ??? traZODone (DESYREL) 50 mg tablet TAKE 1 TABLET BY MOUTH AT BEDTIME AS NEEDED FOR SLEEP   ??? hydrOXYzine pamoate (VISTARIL) 50 mg capsule TAKE 1 CAPSULE BY MOUTH 4 TIMES A DAY AS NEEDED FOR ANXIETY   ??? glucose blood VI test strips (True Metrix Glucose Test Strip) strip Use to test blood sugar once daily   ??? lancets misc Use to test blood sugar once daily   ??? Blood-Glucose Meter (True Metrix Glucose Meter) misc 1 Kit by Does Not Apply route daily.   ??? Insulin Needles, Disposable, 31 gauge x 5/16" ndle Use twice daily for insulin and victoza injections.   ??? SITagliptin (Januvia) 100 mg tablet Take 1 Tablet by mouth daily. Indications: type 2 diabetes mellitus   ??? Latuda 40 mg tab tablet Take 1 Tablet by mouth nightly.   ??? PARoxetine (PAXIL) 30 mg tablet Take 30 mg by mouth nightly.   ??? liraglutide (Victoza 3-Pak) 0.6 mg/0.1 mL (18 mg/3 mL) pnij ADMINISTER 1.8MG SUBCUTANEOUSLY ONCE A DAY  Indications: type 2 diabetes mellitus   ??? methocarbamoL (ROBAXIN) 750 mg tablet Take 1 Tablet by mouth four (4) times daily as needed for Muscle Spasm(s) or Pain.   ???  olmesartan (BENICAR) 40 mg tablet Take 1 Tablet by mouth daily.   ??? levothyroxine (SYNTHROID) 200 mcg tablet Take 1 Tablet by mouth Daily (before breakfast).   ??? buPROPion (WELLBUTRIN) 75 mg tablet Take 1 Tablet by mouth two (2) times a day.   ??? cloNIDine HCL (CATAPRES) 0.3 mg tablet Take 1 Tablet by mouth two (2) times a day.   ??? insulin glargine (Lantus Solostar U-100 Insulin) 100 unit/mL (3 mL) inpn Inject 10 units subcutaneously every night at bedtime.   ??? aspirin delayed-release 81 mg tablet 81 mg.   ??? atorvastatin (Lipitor) 40 mg tablet Take 1 Tablet by mouth nightly for 90 days. Indications: high cholesterol (Patient not taking: Reported on 04/14/2021)   ??? metFORMIN (GLUCOPHAGE) 1,000 mg tablet Take 1 Tablet by mouth two (2) times daily (with meals) for 90 days.     No current facility-administered medications for this visit.       Visit Vitals  BP (!) 165/116 (BP 1 Location: Left arm, BP Patient Position: Sitting, BP Cuff Size: Large adult)   Pulse (!) 112   Temp 97.7 ??F (36.5 ??C) (Temporal)   Resp 16   Ht 5' 1"  (1.549 m)   Wt 229 lb (103.9 kg)   SpO2 98%   BMI 43.27 kg/m??     Physical Exam  Vitals and nursing note reviewed.   Constitutional:       Interventions:  Face mask in place.   Cardiovascular:      Rate and Rhythm: Normal rate.      Pulses: Normal pulses.   Pulmonary:      Effort: Pulmonary effort is normal. No respiratory distress.   Abdominal:      General: Bowel sounds are normal. There is no distension.      Palpations: Abdomen is soft.   Neurological:      Mental Status: Christy [name] is alert and oriented to person, place, and time.      Gait: Gait normal.   Psychiatric:         Mood and Affect: Mood normal.         Behavior: Behavior normal.         Thought Content: Thought content normal.         Judgment: Judgment normal.             Fayne Norrie, DO  Family Medicine  04/14/2021

## 2021-04-14 NOTE — Progress Notes (Signed)
 Christy [name] is a 45 y.o. female (DOB: May 18, 1976) presenting to address:    No chief complaint on file.      There were no vitals filed for this visit.    Hearing/Vision:   No exam data present    Learning Assessment:   No flowsheet data found.  Depression Screening:     3 most recent PHQ Screens 03/24/2021   PHQ Not Done -   Little interest or pleasure in doing things Not at all   Feeling down, depressed, irritable, or hopeless Not at all   Total Score PHQ 2 0   Trouble falling or staying asleep, or sleeping too much Several days   Feeling tired or having little energy Not at all   Poor appetite, weight loss, or overeating Not at all   Feeling bad about yourself - or that you are a failure or have let yourself or your family down Not at all   Trouble concentrating on things such as school, work, reading, or watching TV Not at all   Moving or speaking so slowly that other people could have noticed; or the opposite being so fidgety that others notice Not at all   Thoughts of being better off dead, or hurting yourself in some way Not at all   PHQ 9 Score 1   How difficult have these problems made it for you to do your work, take care of your home and get along with others Not difficult at all     Fall Risk Assessment:     Fall Risk Assessment, last 12 mths 03/24/2021   Able to walk? Yes   Fall in past 12 months? 0   Do you feel unsteady? 0   Are you worried about falling 0     Abuse Screening:     Abuse Screening Questionnaire 03/24/2021   Do you ever feel afraid of your partner? N   Are you in a relationship with someone who physically or mentally threatens you? N   Is it safe for you to go home? Y     ADL Assessment:   No flowsheet data found.     Coordination of Care Questionaire:   1. Have you been to the ER, urgent care clinic since your last visit?  Hospitalized since your last visit? No    2. Have you seen or consulted any other health care providers outside of the [facility] since your last  visit? No     3. For patients aged 71-75: Has the patient had a colonoscopy / FIT/ Cologuard? NA - based on age      If the patient is female:    4. For patients aged 9-74: Has the patient had a mammogram within the past 2 years? Yes - no Care Gap present  See top three    5. For patients aged 21-65: Has the patient had a pap smear? Yes - no Care Gap present    Advanced Directive:   1. Do you have an Advanced Directive? NO    2. Would you like information on Advanced Directives? NO

## 2021-05-31 MED ORDER — OLMESARTAN 40 MG TAB
40 mg | ORAL_TABLET | ORAL | 5 refills | Status: AC
Start: 2021-05-31 — End: ?

## 2021-06-08 ENCOUNTER — Ambulatory Visit: Attending: Cardiovascular Disease | Primary: Family Medicine

## 2021-06-08 ENCOUNTER — Ambulatory Visit
Admit: 2021-06-08 | Discharge: 2021-06-08 | Payer: PRIVATE HEALTH INSURANCE | Attending: Cardiovascular Disease | Primary: Family Medicine

## 2021-06-08 ENCOUNTER — Inpatient Hospital Stay: Admit: 2021-06-08 | Payer: PRIVATE HEALTH INSURANCE | Primary: Family Medicine

## 2021-06-08 DIAGNOSIS — R002 Palpitations: Secondary | ICD-10-CM

## 2021-06-08 DIAGNOSIS — R079 Chest pain, unspecified: Secondary | ICD-10-CM

## 2021-06-08 LAB — TSH AND FREE T4
T4, Free: 1.1 NG/DL (ref 0.7–1.5)
TSH: 3.25 u[IU]/mL (ref 0.36–3.74)

## 2021-06-08 LAB — TSH + FREE T4 PANEL
T4 Free: 1.1 NG/DL (ref 0.7–1.5)
TSH: 3.25 u[IU]/mL (ref 0.36–3.74)

## 2021-06-08 NOTE — Progress Notes (Signed)
Subjective:      Christy [name] is in the office today for cardiac reevaluation. She is a 45 year old woman that has been told she had enlarged heart in the past. Recent CT CTA of the chest demonstrated a normal-sized heart.  She had a normal nuclear stress test done on 07/02/2018.    The patient reported a several year history of chest pain. It was described as "stabbing "in quality. It was also described as sometimes dull. It could last up to 5 minutes. It generally goes away without intervention. She took an occasional aspirin. She localized the discomfort to the left peristernal and inframammary areas. The patient also experienced shortness of breath. She said that one flight of steps would cause her to be very short of breath. She  had no PND or orthopnea.    In the office on 11/26/2020, she reported that she was feeling "okay ".  She reported a recent  ED visit.  She  presented with fatigue, malaise and nausea.  She tested negative for Covid.    At her last appointment her blood pressure still quite elevated.  Metoprolol was added to her regimen but she felt that it was making her lower extremities swell.  She was given clonidine which she is taking only at night.  She remains on the Benicar.    In the office today she reports that she has been having more chest pain.  She says that it is "getting worse ".  Her palpitations are "really strong ".  She feels it that her heart is beating "really hard ".  The chest discomfort is left precordial in location.  She knows of no particular inciting factors.     Patient's cardiac risk factors are smoking/ tobacco exposure, family history, dyslipidemia, diabetes mellitus, hypertension.        Patient Active Problem List    Diagnosis Date Noted    History of nuclear stress test 09/18/2020    Palpitations 09/18/2020    Tobacco abuse 09/18/2020    Family history of premature CAD 09/18/2020    Obesity, morbid ([place]) 10/22/2019    Spinal stenosis     IBS (irritable bowel syndrome)      Chest pain 12/10/2012    Dizziness 12/10/2012    CAD (coronary artery disease) 06/06/2011    Hyperlipidemia with target low density lipoprotein (LDL) cholesterol less than 70 mg/dL 06/06/2011    Hypertension 06/06/2011    Obesity 06/06/2011    DM (diabetes mellitus) ([place]) 06/06/2011    GERD (gastroesophageal reflux disease) 06/06/2011     Current Outpatient Medications   Medication Sig Dispense Refill    olmesartan (BENICAR) 40 mg tablet TAKE 1 TABLET BY MOUTH EVERY DAY 30 Tablet 5    dapagliflozin (FARXIGA) 10 mg tab tablet Take 1 Tablet by mouth daily. 90 Tablet 1    atorvastatin (Lipitor) 40 mg tablet Take 1 Tablet by mouth nightly for 90 days. Indications: high cholesterol 90 Tablet 1    docusate sodium (COLACE) 100 mg capsule Take 1 Capsule by mouth two (2) times a day for 90 days. 60 Capsule 2    traZODone (DESYREL) 50 mg tablet TAKE 1 TABLET BY MOUTH AT BEDTIME AS NEEDED FOR SLEEP      hydrOXYzine pamoate (VISTARIL) 50 mg capsule TAKE 1 CAPSULE BY MOUTH 4 TIMES A DAY AS NEEDED FOR ANXIETY      glucose blood VI test strips (True Metrix Glucose Test Strip) strip Use to test blood sugar once daily 100 Strip  11    lancets misc Use to test blood sugar once daily 1 Each 11    Blood-Glucose Meter (True Metrix Glucose Meter) misc 1 Kit by Does Not Apply route daily. 1 Each 0    Insulin Needles, Disposable, 31 gauge x 5/16" ndle Use twice daily for insulin and victoza injections. 100 Each 11    SITagliptin (Januvia) 100 mg tablet Take 1 Tablet by mouth daily. Indications: type 2 diabetes mellitus 90 Tablet 2    Latuda 40 mg tab tablet Take 1 Tablet by mouth nightly.      PARoxetine (PAXIL) 30 mg tablet Take 30 mg by mouth nightly.      liraglutide (Victoza 3-Pak) 0.6 mg/0.1 mL (18 mg/3 mL) pnij ADMINISTER 1.8MG SUBCUTANEOUSLY ONCE A DAY  Indications: type 2 diabetes mellitus 9 Adjustable Dose Pre-filled Pen Syringe 0    methocarbamoL (ROBAXIN) 750 mg tablet Take 1 Tablet by mouth four (4) times daily as needed for  Muscle Spasm(s) or Pain. 50 Tablet 0    levothyroxine (SYNTHROID) 200 mcg tablet Take 1 Tablet by mouth Daily (before breakfast). 30 Tablet 2    buPROPion (WELLBUTRIN) 75 mg tablet Take 1 Tablet by mouth two (2) times a day. 60 Tablet 5    cloNIDine HCL (CATAPRES) 0.3 mg tablet Take 1 Tablet by mouth two (2) times a day. 180 Tablet 1    insulin glargine (Lantus Solostar U-100 Insulin) 100 unit/mL (3 mL) inpn Inject 10 units subcutaneously every night at bedtime. 45 mL 3    aspirin delayed-release 81 mg tablet 81 mg.      nitroglycerin (NITROSTAT) 0.4 mg SL tablet 1 Tablet by SubLINGual route every five (5) minutes as needed for Chest Pain. Up to 3 doses. 25 Tablet 3    metFORMIN (GLUCOPHAGE) 1,000 mg tablet Take 1 Tablet by mouth two (2) times daily (with meals) for 90 days. 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    Enlarged heart     Hypertension     IBS (irritable bowel syndrome)     Interstitial cystitis     Sciatica     Spinal stenosis      Past Surgical History:   Procedure Laterality Date    HX CHOLECYSTECTOMY      2008    HX ORTHOPAEDIC      LEFT KNEE    HX ORTHOPAEDIC      LEFT ARM    HX PARTIAL HYSTERECTOMY  2003    STILL HAVE OVARIES    HX PELVIC LAPAROSCOPY      HX THYROIDECTOMY       Family History   Problem Relation Age of Onset    Diabetes Mother     Heart Disease Mother     Diabetes Father     Cancer Sister     Heart Disease Maternal Aunt     Cancer Paternal Aunt      Social History  Tobacco Use   Smoking Status Every Day    Packs/day: 0.25    Types: Cigarettes   Smokeless Tobacco Never          Review of  Systems, additional:  Constitutional: negative  Eyes: negative  Respiratory: positive for dyspnea on exertion  Cardiovascular: positive for chest pain, palpitations, dyspnea on exertion  Gastrointestinal: negative  Musculoskeletal:negative  Neurological: negative  Behvioral/Psych: negative  Endocrine: negative  ENT: negative    Objective:     Visit Vitals  BP 130/88   Pulse (!) 113   Temp 97.8 ??F (36.6 ??C) (Temporal)   Wt 104.4 kg (230 lb 3.2 oz)   SpO2 98%   BMI 43.50 kg/m??     General:  alert, cooperative, no distress   Chest Wall: inspection normal - no chest wall deformities or tenderness, respiratory effort normal   Lung: clear to auscultation bilaterally   Heart:  normal rate and regular rhythm, S1 and S2 normal, no murmurs noted, no gallops noted, no JVD   Abdomen: soft, non-tender. Bowel sounds normal. No masses,  no organomegaly   Extremities: extremities normal, atraumatic, no cyanosis or edema Skin: no rashes   Neuro: alert, oriented, normal speech, no focal findings or movement disorder noted     EKG: 06/08/2021.  Sinus tachycardia.  Nondiagnostic ST wave abnormalities.    Assessment/Plan:       ICD-10-CM ICD-9-CM    1. Primary hypertension, controlled   BP in the office today.  Asymptomatic.Marland Kitchen  Started metoprolol 100 mg twice daily at last visit.  She stopped the metoprolol because of  leg swelling.  The swelling improved.  She was prescribed clonidine 0.3 mg twice daily and takes 1 at night.  . I10 401.9 ECHO ADULT COMPLETE      TSH AND FREE T4   2. DOE (dyspnea on exertion)  R06.00 786.09 ECHO ADULT COMPLETE      TSH AND FREE T4      CARDIAC HOLTER MONITOR      AMB POC EKG ROUTINE W/ 12 LEADS, INTER & REP   3. Screening for thyroid disorder , order TSH Z13.29 V77.0 TSH AND FREE T4   4. Hyperlipidemia with target low density lipoprotein (LDL) cholesterol less than 70 mg/dL, 03/16/2021.. Total cholesterol 272 HDL 56.  Triglycerides 160.  LDL 184 patient on Lipitor 20 mg daily. E78.5 272.4    5. Diabetes  mellitus due to underlying condition with hyperosmolarity without coma, unspecified whether long term insulin use (HCC)  E08.00 249.20    6. History of nuclear stress test , 07/02/2018, low risk. Z92.89 V15.89    7. Chest pain, unspecified type , symptoms "getting worse ". Will order nuclear stress test.  Return 4 to 6 weeks R07.9 786.50    8. Palpitations , recent increase in symptoms, echocardiogram completed on 11/24/2020.  EF 55 to 60%.  Indeterminate diastolic function.  Moderate concentric LVH.  R00.2 785.1    9. Tobacco abuse , Wellbutrin 75 mg twice daily. Z72.0 305.1

## 2021-06-08 NOTE — Progress Notes (Signed)
Identified pt with two pt identifiers(name and DOB). Reviewed record in preparation for visit and have obtained necessary documentation.    Christy [name] presents today for   Chief Complaint   Patient presents with    Follow-up     3 months        Pt c/o  CHEST PAIN/palps             Christy [name] preferred language for health care discussion is english/other.    Personal Protective Equipment:   Engineer, maintenance was used including: mask-surgical and hands-gloves. Patient was placed on no precaution(s). Patient was masked.    Precautions:   Patient currently on None  Patient currently roomed with door closed.    Is someone accompanying this pt? no    Is the patient using any DME equipment during OV? no    Depression Screening:  3 most recent PHQ Screens 06/08/2021   PHQ Not Done -   Little interest or pleasure in doing things Not at all   Feeling down, depressed, irritable, or hopeless Not at all   Total Score PHQ 2 0   Trouble falling or staying asleep, or sleeping too much -   Feeling tired or having little energy -   Poor appetite, weight loss, or overeating -   Feeling bad about yourself - or that you are a failure or have let yourself or your family down -   Trouble concentrating on things such as school, work, reading, or watching TV -   Moving or speaking so slowly that other people could have noticed; or the opposite being so fidgety that others notice -   Thoughts of being better off dead, or hurting yourself in some way -   PHQ 9 Score -   How difficult have these problems made it for you to do your work, take care of your home and get along with others -       Learning Assessment:  No flowsheet data found.    Abuse Screening:  Abuse Screening Questionnaire 03/24/2021   Do you ever feel afraid of your partner? N   Are you in a relationship with someone who physically or mentally threatens you? N   Is it safe for you to go home? Y       Fall Risk  Fall Risk Assessment, last 12 mths 03/24/2021    Able to walk? Yes   Fall in past 12 months? 0   Do you feel unsteady? 0   Are you worried about falling 0       Pt currently taking Anticoagulant therapy? no  Pt currently taking Antiplatelet therapy? yes    Coordination of Care:  1. Have you been to the ER, urgent care clinic since your last visit? Hospitalized since your last visit? Yes. [facility]. 05/28/21. Chest pains    2. Have you seen or consulted any other health care providers outside of the [facility] since your last visit? Include any pap smears or colon screening. no      Please see Red banners under Allergies and Med Rec to remove outside inquires. All correct information has been verified with patient and added to chart.     Medication's patient's would liked removed has been marked not taking to be removed per Verbal order and read back per Merdis Delay, MD

## 2021-06-08 NOTE — Telephone Encounter (Signed)
PCP: Reyes Ivan, DO    Last appt: 06/08/2021  No future appointments.    Requested Prescriptions     Pending Prescriptions Disp Refills    nitroglycerin (NITROSTAT) 0.4 mg SL tablet 25 Tablet 3     Sig: 1 Tablet by SubLINGual route every five (5) minutes as needed for Chest Pain. Up to 3 doses.

## 2021-06-09 MED ORDER — NITROGLYCERIN 0.4 MG SUBLINGUAL TAB
0.4 mg | ORAL_TABLET | SUBLINGUAL | 3 refills | Status: AC | PRN
Start: 2021-06-09 — End: ?

## 2021-07-11 ENCOUNTER — Encounter: Attending: Cardiovascular Disease | Primary: Family Medicine

## 2021-07-13 ENCOUNTER — Ambulatory Visit: Attending: Family Medicine | Primary: Family Medicine

## 2021-07-13 ENCOUNTER — Encounter
Admit: 2021-07-13 | Discharge: 2021-07-13 | Payer: PRIVATE HEALTH INSURANCE | Attending: Family Medicine | Primary: Family Medicine

## 2021-07-13 ENCOUNTER — Inpatient Hospital Stay: Admit: 2021-07-13 | Payer: PRIVATE HEALTH INSURANCE | Primary: Family Medicine

## 2021-07-13 DIAGNOSIS — N898 Other specified noninflammatory disorders of vagina: Secondary | ICD-10-CM

## 2021-07-13 DIAGNOSIS — E119 Type 2 diabetes mellitus without complications: Secondary | ICD-10-CM

## 2021-07-13 LAB — AMB POC URINALYSIS DIP STICK AUTO W/O MICRO
Bilirubin (UA POC): NEGATIVE
Bilirubin, Urine, POC: NEGATIVE
Blood (UA POC): NEGATIVE
Blood (UA POC): NEGATIVE
Ketones (UA POC): NEGATIVE
Ketones, Urine, POC: NEGATIVE
Leukocyte Esterase, Urine, POC: NEGATIVE
Leukocyte esterase (UA POC): NEGATIVE
Nitrite, Urine, POC: NEGATIVE
Nitrites (UA POC): NEGATIVE
Protein (UA POC): NEGATIVE
Protein, Urine, POC: NEGATIVE
Specific Gravity, Urine, POC: 1.015 NA (ref 1.001–1.035)
Specific gravity (UA POC): 1.015 (ref 1.001–1.035)
Urobilinogen (UA POC): 0.2 (ref 0.2–1)
Urobilinogen, POC: 0.2 (ref 0.2–1)
pH (UA POC): 5.5 (ref 4.6–8.0)
pH, Urine, POC: 5.5 NA (ref 4.6–8.0)

## 2021-07-13 LAB — AMB POC HEMOGLOBIN A1C
Hemoglobin A1C, POC: 8.5 %
Hemoglobin A1c (POC): 8.5 %

## 2021-07-13 MED ORDER — FLUCONAZOLE 150 MG TAB
150 mg | ORAL_TABLET | Freq: Every day | ORAL | 0 refills | Status: AC
Start: 2021-07-13 — End: 2021-07-14

## 2021-07-13 MED ORDER — PAROXETINE 40 MG TAB
40 mg | ORAL_TABLET | Freq: Every evening | ORAL | 1 refills | Status: DC
Start: 2021-07-13 — End: 2021-12-02

## 2021-07-13 MED ORDER — DULAGLUTIDE 0.75 MG/0.5 ML SUBCUTANEOUS PEN INJECTOR
0.750.5 mg/0.5 mL | PEN_INJECTOR | SUBCUTANEOUS | 0 refills | Status: AC
Start: 2021-07-13 — End: 2021-08-23

## 2021-07-13 NOTE — Progress Notes (Signed)
Reviewed in office with Patient

## 2021-07-13 NOTE — Progress Notes (Signed)
Can you check to see how Christy [name] is feeling? The culture resulted with a bacterial infection and not a fungal infection so if she is still having symptoms I can send the new treatment for her. If she is feeling well then we can monitor without additional treatment.

## 2021-07-13 NOTE — Progress Notes (Signed)
 Christy [name] is a 45 y.o. female (DOB: Jan 06, 1976) presenting to address:    Chief Complaint   Patient presents with    Medication Evaluation       Vitals:    07/13/21 1131   BP: (!) 161/100   Pulse: (!) 114   Resp: 16   Temp: 97.2 F (36.2 C)   TempSrc: Temporal   SpO2: 98%   Weight: 223 lb 12.8 oz (101.5 kg)   Height: 5' 1 (1.549 m)   PainSc:   0 - No pain       Hearing/Vision:   No results found.    Learning Assessment:   No flowsheet data found.  Depression Screening:     3 most recent PHQ Screens 07/13/2021   PHQ Not Done -   Little interest or pleasure in doing things Not at all   Feeling down, depressed, irritable, or hopeless Not at all   Total Score PHQ 2 0   Trouble falling or staying asleep, or sleeping too much -   Feeling tired or having little energy -   Poor appetite, weight loss, or overeating -   Feeling bad about yourself - or that you are a failure or have let yourself or your family down -   Trouble concentrating on things such as school, work, reading, or watching TV -   Moving or speaking so slowly that other people could have noticed; or the opposite being so fidgety that others notice -   Thoughts of being better off dead, or hurting yourself in some way -   PHQ 9 Score -   How difficult have these problems made it for you to do your work, take care of your home and get along with others -     Fall Risk Assessment:     Fall Risk Assessment, last 12 mths 03/24/2021   Able to walk? Yes   Fall in past 12 months? 0   Do you feel unsteady? 0   Are you worried about falling 0     Abuse Screening:     Abuse Screening Questionnaire 03/24/2021   Do you ever feel afraid of your partner? N   Are you in a relationship with someone who physically or mentally threatens you? N   Is it safe for you to go home? Y     ADL Assessment:   No flowsheet data found.     Coordination of Care Questionaire:   1. Have you been to the ER, urgent care clinic since your last visit?  Hospitalized since your last visit?  No    2. Have you seen or consulted any other health care providers outside of the [facility] since your last visit? No     3. For patients aged 44-75: Has the patient had a colonoscopy / FIT/ Cologuard? NA - based on age      If the patient is female:    4. For patients aged 108-74: Has the patient had a mammogram within the past 2 years? Yes - no Care Gap present  See top three    5. For patients aged 21-65: Has the patient had a pap smear? Yes - no Care Gap present    Advanced Directive:   1. Do you have an Advanced Directive? NO    2. Would you like information on Advanced Directives? NO

## 2021-07-13 NOTE — Progress Notes (Signed)
Spoke to pt and stated that she is still having vaginal itching. Pt denies new sx but stated that the itching didn't go away. Pharmacy has been verified.

## 2021-07-13 NOTE — Progress Notes (Signed)
Christy [name] (DOB: 04/06/1976) is a 45 y.o. female, ESTABLISHED patient, here for FOLLOW UP:    ICD-10-CM ICD-9-CM   1. Type 2 diabetes mellitus without complication, with long-term current use of insulin (HCC)  E11.9 250.00    Z79.4 V58.67   2. Vaginal itching  N89.8 698.1   3. Class 3 severe obesity due to excess calories with serious comorbidity and body mass index (BMI) of 40.0 to 44.9 in adult (HCC)  E66.01 278.01    Z68.41 V85.41   4. Constipation, unspecified constipation type  K59.00 564.00   5. B12 deficiency  E53.8 266.2   6. Hyperlipidemia with target low density lipoprotein (LDL) cholesterol less than 70 mg/dL  E78.5 272.4   7. HTN, goal below 130/80  I10 401.9   8. Acquired hypothyroidism  E03.9 244.9   9. History of IBS  Z87.19 V12.79     Assessment   Plan     #Hx of candida vaginitis with #Vaginal pruritis  Nuswab collected in office 07/13/21    Results for orders placed or performed in visit on 07/13/21   AMB POC URINALYSIS DIP STICK AUTO W/O MICRO     Status: Abnormal   Result Value Ref Range Status    Color (UA POC) Yellow  Final    Clarity (UA POC) Clear  Final    Glucose (UA POC) 3+ Negative Final    Bilirubin (UA POC) Negative Negative Final    Ketones (UA POC) Negative Negative Final    Specific gravity (UA POC) 1.015 1.001 - 1.035 Final    Blood (UA POC) Negative Negative Final    pH (UA POC) 5.5 4.6 - 8.0 Final    Protein (UA POC) Negative Negative Final    Urobilinogen (UA POC) 0.2 mg/dL 0.2 - 1 Final    Nitrites (UA POC) Negative Negative Final    Leukocyte esterase (UA POC) Negative Negative Final     Results       Procedure Component Value Units Date/Time    CULTURE, URINE [[id number]] Collected: 07/13/21 1247    Order Status: No result Specimen: Urine           #Normocytica anemia, 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             Lab Results   Component Value Date/Time     WBC 7.7 03/16/2021 09:04 AM     HGB 10.8 (L) 03/16/2021 09:04  AM     HCT 33.6 (L) 03/16/2021 09:04 AM     PLATELET 232 03/16/2021 09:04 AM     MCV 87.7 03/16/2021 09:04 AM            Lab Results   Component Value Date/Time     Ferritin 114 03/24/2021 09:51 AM      #Vit B12 Def - reviewed dx and recommended supplement dosing  Lab Results   Component Value Date/Time    Vitamin B12 202 (L) 03/16/2021 09:04 AM     #Vit D Def - reviewed dx and recommended supplement dosing  Lab Results   Component Value Date/Time    Vitamin D 25-Hydroxy 18.1 (L) 03/16/2021 09:04 AM       #GERD - Reviewed behavioral and dietary changes to reduce GERD symptoms. Handout provided.   #Hx of IBS  #S/p cholecystectomy 2009  Suspect #constipation worsened by #dehydration contributing to above  Newly established with GI and started on Linzess. Rec review last visit 06/27/21  Last EGD/Colonoscopy 2022  Rec review CT Abd/Pelv 03/30/21 with moderate stool burden, mild hepatic steatosis  Reviewed can consider increase in dose of Linzess if limited improvement on current dosing.      #NIDDM2 - Goal A1C <7  Hypoglycemia? Denies  Limited improvement despite good compliance with Victoza; Plan to try alternative of Trulicity   Tolerating other diabetes medications without notable AE, will continue as described below     Last Point of Care HGB A1C  Hemoglobin A1c (POC)   Date Value Ref Range Status   07/13/2021 8.5 % Final            Lab Results   Component Value Date/Time     Hemoglobin A1c 8.8 (H) 03/16/2021 09:04 AM     Hemoglobin A1c 8.2 (H) 10/22/2019 12:00 AM     Glucose 203 (H) 03/16/2021 09:04 AM     Glucose POC 125 12/27/2011 11:56 AM     Microalbumin/Creat ratio (mg/g creat) 7 08/26/2020 08:22 AM     Microalbumin,urine random 1.15 08/26/2020 08:22 AM     LDL-CHOLESTEROL 89 10/22/2019 12:00 AM     LDL, calculated 184 (H) 03/16/2021 09:04 AM     Creatinine, POC 1.1 03/30/2021 08:34 AM     Creatinine 0.92 03/16/2021 09:04 AM      Key Antihyperglycemic Medications               dulaglutide (TRULICITY) 5.64 PP/2.9  mL sub-q pen (Taking) Initial: 0.75 mg once weekly; May increase to 1.5 mg once weekly after 4 to 8 weeks if needed to achieve glycemic goals. We can adjust the next Rx.  Indications: type 2 diabetes mellitus    dapagliflozin (FARXIGA) 10 mg tab tablet (Taking) Take 1 Tablet by mouth daily.    SITagliptin (Januvia) 100 mg tablet (Taking) Take 1 Tablet by mouth daily. Indications: type 2 diabetes mellitus    insulin glargine (Lantus Solostar U-100 Insulin) 100 unit/mL (3 mL) inpn (Taking) Inject 10 units subcutaneously every night at bedtime.    metFORMIN (GLUCOPHAGE) 1,000 mg tablet Take 1 Tablet by mouth two (2) times daily (with meals) for 90 days.           #HTN - uncontrolled, reports forgot to take medication today   BP cuff at home? Yes, reports home average closer to 120-130/80  Reviewed goals of BP and encouraged to monitor and if blood pressure measurements at home are close to or higher than 140/90 frequently then we should make adjustments to medication regimen. Reviewed risk of stroke or cardiovascular changes with persistently elevated BP.    Tolerating medications without notable AE, meds managed by Cardiology   Rec review last visit with Cardio - 06/08/21     BP Readings from Last 3 Encounters:   07/13/21 (!) 161/100   06/08/21 130/88   04/14/21 (!) 165/116     #HLD - Goal < 70  Tolerating medications without notable AE, will continue regimen unchanged              Lab Results   Component Value Date/Time     LDL-CHOLESTEROL 89 10/22/2019 12:00 AM     LDL, calculated 184 (H) 03/16/2021 09:04 AM      The 10-year ASCVD risk score Mikey Bussing DC Jr., et al., 2013) is: 28.1%    Values used to calculate the score:      Age: 66 years      Sex: Female      Is Non-Hispanic African American: Yes  Diabetic: Yes      Tobacco smoker: Yes      Systolic Blood Pressure: 063 mmHg      Is BP treated: Yes      HDL Cholesterol: 56 MG/DL      Total Cholesterol: 272 MG/DL     Key CAD CHF Meds               nitroglycerin  (NITROSTAT) 0.4 mg SL tablet (Taking) 1 Tablet by SubLINGual route every five (5) minutes as needed for Chest Pain. Up to 3 doses.    olmesartan (BENICAR) 40 mg tablet (Taking) TAKE 1 TABLET BY MOUTH EVERY DAY    cloNIDine HCL (CATAPRES) 0.3 mg tablet (Taking) Take 1 Tablet by mouth two (2) times a day.    aspirin delayed-release 81 mg tablet (Taking) 81 mg.    atorvastatin (Lipitor) 40 mg tablet Take 1 Tablet by mouth nightly for 90 days. Indications: high cholesterol          #Acquired Hypothyroidism - well controlled   Thyroid antibodies? Negative May 2022  Thyroid U/S? Never done  Tolerating medications without notable AE, will continue regimen unchanged               Lab Results   Component Value Date/Time     TSH 3.71 03/16/2021 09:04 AM     T4, Free 1.1 03/16/2021 09:04 AM      #Body mass index is 43.27 kg/m??.  Counseled on diet and exercise methods. Positive reinforcement provided.   Interested in referral to [place], ordered 07/13/21    Wt Readings from Last 3 Encounters:   07/13/21 223 lb 12.8 oz (101.5 kg)   06/08/21 230 lb 3.2 oz (104.4 kg)   04/14/21 229 lb (103.9 kg)           Orders Placed This Encounter    CULTURE, URINE     Standing Status:   Future     Standing Expiration Date:   07/14/2022    NUSWAB VG PLUS+MYCOPLASMAS,NAA     Standing Status:   Future     Standing Expiration Date:   07/14/2022     Order Specific Question:   Specimen source     Answer:   Vaginal [[id number]]    EMPL [facility] Ref [facility]     Referral Priority:   Routine     Referral Type:   Consultation     Referral Reason:   Specialty Services Required     Referred to Provider:   Forrest Moron, MD     Number of Visits Requested:   1    AMB POC HEMOGLOBIN A1C    AMB POC URINALYSIS DIP STICK AUTO W/O MICRO    linaCLOtide (LINZESS) 145 mcg cap capsule     Sig: Take 145 mcg by mouth.    PARoxetine (PAXIL) 40 mg tablet     Sig: Take 1 Tablet by mouth nightly. Indications: depression     Dispense:  90 Tablet     Refill:  1     dulaglutide (TRULICITY) 0.16 WF/0.9 mL sub-q pen     Sig: Initial: 0.75 mg once weekly; May increase to 1.5 mg once weekly after 4 to 8 weeks if needed to achieve glycemic goals. We can adjust the next Rx.  Indications: type 2 diabetes mellitus     Dispense:  4 Pen     Refill:  0     Will increase dose with refill    fluconazole (DIFLUCAN) 150  mg tablet     Sig: Take 1 Tablet by mouth daily for 1 day. If symptoms persist, can repeat dose 3 days after initial dose. FDA advises cautious prescribing of oral fluconazole in pregnancy.  Indications: a yeast infection of the vagina and vulva     Dispense:  2 Tablet     Refill:  0     Return in about 5 months (around 12/13/2021) for 30 min follow up, A1C check.    Subjective   Last PCP visit: 04/14/2021  Since last visit:   Any changes in health, procedures, hospital visits, or specialist visits? See A/P  Tolerating medications without adverse reactions? Reports good compliance with Rx without notable adverse effects.       Current Outpatient Medications   Medication Instructions    aspirin delayed-release 81 mg    atorvastatin (LIPITOR) 40 mg, Oral, EVERY BEDTIME    Blood-Glucose Meter (True Metrix Glucose Meter) misc 1 Kit, Does Not Apply, DAILY    buPROPion (WELLBUTRIN) 75 mg, Oral, 2 TIMES DAILY    cloNIDine HCL (CATAPRES) 0.3 mg, Oral, 2 TIMES DAILY    dapagliflozin (FARXIGA) 10 mg, Oral, DAILY    dulaglutide (TRULICITY) 0.63 KZ/6.0 mL sub-q pen Initial: 0.75 mg once weekly; May increase to 1.5 mg once weekly after 4 to 8 weeks if needed to achieve glycemic goals. We can adjust the next Rx.    fluconazole (DIFLUCAN) 150 mg, Oral, DAILY, If symptoms persist, can repeat dose 3 days after initial dose. FDA advises cautious prescribing of oral fluconazole in pregnancy.    glucose blood VI test strips (True Metrix Glucose Test Strip) strip Use to test blood sugar once daily    hydrOXYzine pamoate (VISTARIL) 50 mg capsule TAKE 1 CAPSULE BY MOUTH 4 TIMES A DAY AS NEEDED FOR  ANXIETY    insulin glargine (Lantus Solostar U-100 Insulin) 100 unit/mL (3 mL) inpn Inject 10 units subcutaneously every night at bedtime.    Insulin Needles, Disposable, 31 gauge x 5/16" ndle Use twice daily for insulin and victoza injections.    lancets misc Use to test blood sugar once daily    Latuda 40 mg tab tablet 1 Tablet, Oral, EVERY BEDTIME    levothyroxine (SYNTHROID) 200 mcg, Oral, DAILY BEFORE BREAKFAST    linaCLOtide (LINZESS) 145 mcg, Oral    metFORMIN (GLUCOPHAGE) 1,000 mg, Oral, 2 TIMES DAILY WITH MEALS    methocarbamoL (ROBAXIN) 750 mg, Oral, 4 TIMES DAILY AS NEEDED    nitroglycerin (NITROSTAT) 0.4 mg, SubLINGual, EVERY 5 MIN AS NEEDED, Up to 3 doses.    olmesartan (BENICAR) 40 mg tablet TAKE 1 TABLET BY MOUTH EVERY DAY    PARoxetine (PAXIL) 40 mg, Oral, EVERY BEDTIME    SITagliptin (JANUVIA) 100 mg, Oral, DAILY    traZODone (DESYREL) 50 mg tablet TAKE 1 TABLET BY MOUTH AT BEDTIME AS NEEDED FOR SLEEP      Objective   Visit Vitals  BP (!) 161/100 (BP 1 Location: Left upper arm, BP Patient Position: Sitting, BP Cuff Size: Large adult)   Pulse (!) 114   Temp 97.2 ??F (36.2 ??C) (Temporal)   Resp 16   Ht 5' 1"  (1.549 m)   Wt 223 lb 12.8 oz (101.5 kg)   SpO2 98%   BMI 42.29 kg/m??     Physical Exam  Vitals and nursing note reviewed.   Constitutional:       Interventions: Face mask in place.   Cardiovascular:      Rate and  Rhythm: Tachycardia present.      Pulses: Normal pulses.   Pulmonary:      Effort: Pulmonary effort is normal. No respiratory distress.   Abdominal:      General: There is no distension.   Neurological:      Mental Status: She is alert and oriented to person, place, and time.      Gait: Gait normal.   Psychiatric:         Mood and Affect: Mood normal.         Behavior: Behavior normal.         Thought Content: Thought content normal.         Judgment: Judgment normal.          Fayne Norrie, DO  Family Medicine  07/13/2021

## 2021-07-13 NOTE — Progress Notes (Signed)
New rx sent to pharmacy and patient notified via mychart

## 2021-07-14 LAB — CULTURE, URINE
Colonies Counted: 20000
Colony Count: 20000

## 2021-07-19 LAB — NUSWAB VG PLUS+MYCOPLASMAS,NAA
C. albicans, NAA: NEGATIVE
C. glabrata, NAA: NEGATIVE
C. trachomatis, NAA: NEGATIVE
M. genitalium, NAA: NEGATIVE
M. hominis, NAA: NEGATIVE
N. gonorrhoeae, NAA: NEGATIVE
T. vaginalis, NAA: NEGATIVE
Ureaplasma spp, NAA: NEGATIVE

## 2021-07-26 ENCOUNTER — Encounter

## 2021-07-27 MED ORDER — METRONIDAZOLE 500 MG TAB
500 mg | ORAL_TABLET | Freq: Two times a day (BID) | ORAL | 0 refills | Status: AC
Start: 2021-07-27 — End: 2021-08-02

## 2021-08-03 ENCOUNTER — Encounter: Payer: PRIVATE HEALTH INSURANCE | Attending: Cardiovascular Disease | Primary: Family Medicine

## 2021-08-23 ENCOUNTER — Telehealth: Attending: Family Medicine | Primary: Family Medicine

## 2021-08-23 ENCOUNTER — Encounter: Payer: PRIVATE HEALTH INSURANCE | Attending: Family Medicine | Primary: Family Medicine

## 2021-08-23 DIAGNOSIS — Z7689 Persons encountering health services in other specified circumstances: Secondary | ICD-10-CM

## 2021-08-23 MED ORDER — CLONIDINE 0.3 MG TAB
0.3 mg | ORAL_TABLET | Freq: Two times a day (BID) | ORAL | 1 refills | Status: AC
Start: 2021-08-23 — End: ?

## 2021-08-23 MED ORDER — DULAGLUTIDE 1.5 MG/0.5 ML SUBCUTANEOUS PEN INJECTOR
1.5 mg/0.5 mL | SUBCUTANEOUS | 0 refills | Status: DC
Start: 2021-08-23 — End: 2021-10-13

## 2021-08-23 NOTE — Progress Notes (Signed)
Christy [name] (DOB: 30-Mar-1976) is a 45 y.o. female, ESTABLISHED patient, here for a VIRTUAL VISIT to address:    ICD-10-CM ICD-9-CM   1. Return to work evaluation  Z76.89 V72.85   2. Type 2 diabetes mellitus without complication, with long-term current use of insulin (HCC)  E11.9 250.00    Z79.4 V58.67   3. Thoracic degenerative disc disease  M51.34 722.51   4. Class 3 severe obesity due to excess calories with serious comorbidity and body mass index (BMI) of 40.0 to 44.9 in adult (HCC)  E66.01 278.01    Z68.41 V85.41   5. Constipation, unspecified constipation type  K59.00 564.00   6. Hyperlipidemia with target low density lipoprotein (LDL) cholesterol less than 70 mg/dL  E78.5 272.4   7. HTN, goal below 130/80  I10 401.9   8. B12 deficiency  E53.8 266.2   9. Acquired hypothyroidism  E03.9 244.9   10. History of IBS  Z87.19 V12.79   11. Pain of upper abdomen  R10.10 789.09     Assessment   Plan     #Return to work evaluation   Unemployment paperwork completed together with patient indicating recommendation for avoidance of heavy lifting to reduce exacerbations of symptoms related to chronic mid back pain and chronic abdominal pain. Copy to be scanned to chart.    #Chronic mid back pain 2/2 Spinal stenosis x Years - Initial eval of issue by me  Patient reports previously seen by PT, Ortho, Spinal surgeon, Pain management. Reports had injections before for back without any relief.   Previously on Flexeril without notable benefit. Gabapentin 33m TID without notable benefit. Lyrica without relief.   Rec review Lumbar spine 12/11/18 - No spondylolisthesis. No compression deformity. Mild endplate spondylosis anteriorly, stable. No significant loss of discal height. Facets are intact. No spondylolysis.   Rec review Thoracic spine 09/18/18 - Mild multilevel spinal degenerative changes.   Last MRI >1year ago     #GERD - Reviewed behavioral and dietary changes to reduce GERD symptoms. Handout provided.   #Hx of IBS  #S/p  cholecystectomy 2009  Suspect #constipation worsened by #dehydration contributing to above  Newly established with GI and started on Linzess. Rec review last visit 06/27/21  Last EGD/Colonoscopy 2022  Rec review CT Abd/Pelv 03/30/21 with moderate stool burden, mild hepatic steatosis  Reviewed can consider increase in dose of Linzess if limited improvement on current dosing.      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             Lab Results   Component Value Date/Time     WBC 7.7 03/16/2021 09:04 AM     HGB 10.8 (L) 03/16/2021 09:04 AM     HCT 33.6 (L) 03/16/2021 09:04 AM     PLATELET 232 03/16/2021 09:04 AM     MCV 87.7 03/16/2021 09:04 AM            Lab Results   Component Value Date/Time     Ferritin 114 03/24/2021 09:51 AM      #Vit B12 Def - reviewed dx and recommended supplement dosing  Lab Results   Component Value Date/Time    Vitamin B12 202 (L) 03/16/2021 09:04 AM     #Vit D Def - reviewed dx and recommended supplement dosing  Lab Results   Component Value Date/Time    Vitamin D 25-Hydroxy 18.1 (L) 03/16/2021 09:04 AM       #  NIDDM2 - Goal A1C <7  Hypoglycemia? Denies  Limited improvement despite good compliance with Victoza; Plan to titrate up Trulicity   Tolerating other diabetes medications without notable AE, will continue as described below     Last Point of Care HGB A1C  Hemoglobin A1c (POC)   Date Value Ref Range Status   07/13/2021 8.5 % Final            Lab Results   Component Value Date/Time     Hemoglobin A1c 8.8 (H) 03/16/2021 09:04 AM     Hemoglobin A1c 8.2 (H) 10/22/2019 12:00 AM     Glucose 203 (H) 03/16/2021 09:04 AM     Glucose POC 125 12/27/2011 11:56 AM     Microalbumin/Creat ratio (mg/g creat) 7 08/26/2020 08:22 AM     Microalbumin,urine random 1.15 08/26/2020 08:22 AM     LDL-CHOLESTEROL 89 10/22/2019 12:00 AM     LDL, calculated 184 (H) 03/16/2021 09:04 AM     Creatinine, POC 1.1 03/30/2021 08:34 AM     Creatinine  0.92 03/16/2021 09:04 AM      Key Antihyperglycemic Medications               dulaglutide (TRULICITY) 1.5 ZO/1.0 mL sub-q pen (Taking) 0.5 mL by SubCUTAneous route every seven (7) days. Indications: type 2 diabetes mellitus    dapagliflozin (FARXIGA) 10 mg tab tablet Take 1 Tablet by mouth daily.    SITagliptin (Januvia) 100 mg tablet Take 1 Tablet by mouth daily. Indications: type 2 diabetes mellitus    metFORMIN (GLUCOPHAGE) 1,000 mg tablet Take 1 Tablet by mouth two (2) times daily (with meals) for 90 days.    insulin glargine (Lantus Solostar U-100 Insulin) 100 unit/mL (3 mL) inpn Inject 10 units subcutaneously every night at bedtime.          #HTN - Goal <130/80  BP cuff at home? Yes, reports home average closer to 120-130/80  Reviewed goals of BP and encouraged to monitor and if blood pressure measurements at home are close to or higher than 140/90 frequently then we should make adjustments to medication regimen. Reviewed risk of stroke or cardiovascular changes with persistently elevated BP.    Tolerating medications without notable AE  Established with Cardio - last visit 06/08/21     BP Readings from Last 3 Encounters:   07/13/21 (!) 161/100   06/08/21 130/88   04/14/21 (!) 165/116     #HLD - Goal < 70  Tolerating medications without notable AE, will continue regimen unchanged              Lab Results   Component Value Date/Time     LDL-CHOLESTEROL 89 10/22/2019 12:00 AM     LDL, calculated 184 (H) 03/16/2021 09:04 AM      The 10-year ASCVD risk score Mikey Bussing DC Jr., et al., 2013) is: 28.1%    Values used to calculate the score:      Age: 22 years      Sex: Female      Is Non-Hispanic African American: Yes      Diabetic: Yes      Tobacco smoker: Yes      Systolic Blood Pressure: 960 mmHg      Is BP treated: Yes      HDL Cholesterol: 56 MG/DL      Total Cholesterol: 272 MG/DL     Key CAD CHF Meds               cloNIDine  HCL (CATAPRES) 0.3 mg tablet (Taking) Take 1 Tablet by mouth two (2) times a day.     nitroglycerin (NITROSTAT) 0.4 mg SL tablet 1 Tablet by SubLINGual route every five (5) minutes as needed for Chest Pain. Up to 3 doses.    olmesartan (BENICAR) 40 mg tablet TAKE 1 TABLET BY MOUTH EVERY DAY    atorvastatin (Lipitor) 40 mg tablet Take 1 Tablet by mouth nightly for 90 days. Indications: high cholesterol    aspirin delayed-release 81 mg tablet 81 mg.          #Acquired Hypothyroidism - well controlled   Thyroid antibodies? Negative May 2022  Thyroid U/S? Never done  Tolerating medications without notable AE, will continue regimen unchanged               Lab Results   Component Value Date/Time     TSH 3.71 03/16/2021 09:04 AM     T4, Free 1.1 03/16/2021 09:04 AM      #Body mass index is 43.27 kg/m??.  Counseled on diet and exercise methods. Positive reinforcement provided.   Interested in referral to [place], ordered 07/13/21    Wt Readings from Last 3 Encounters:   07/13/21 223 lb 12.8 oz (101.5 kg)   06/08/21 230 lb 3.2 oz (104.4 kg)   04/14/21 229 lb (103.9 kg)           Orders Placed This Encounter    dulaglutide (TRULICITY) 1.5 ZO/1.0 mL sub-q pen     Sig: 0.5 mL by SubCUTAneous route every seven (7) days. Indications: type 2 diabetes mellitus     Dispense:  4 Each     Refill:  0    cloNIDine HCL (CATAPRES) 0.3 mg tablet     Sig: Take 1 Tablet by mouth two (2) times a day.     Dispense:  180 Tablet     Refill:  1     Return in about 3 months (around 11/23/2021) for 30 min follow up, A1C check, routine care with PCP, BP check.    Subjective   Last PCP visit: 07/13/2021  Since last visit:   Any changes in health, procedures, hospital visits, or specialist visits? See A/P  Tolerating medications without adverse reactions? Reports good compliance with Rx without notable adverse effects.       Current Outpatient Medications   Medication Instructions    aspirin delayed-release 81 mg    atorvastatin (LIPITOR) 40 mg, Oral, EVERY BEDTIME    Blood-Glucose Meter (True Metrix Glucose Meter) misc 1 Kit, Does Not  Apply, DAILY    buPROPion (WELLBUTRIN) 75 mg, Oral, 2 TIMES DAILY    cloNIDine HCL (CATAPRES) 0.3 mg, Oral, 2 TIMES DAILY    dapagliflozin (FARXIGA) 10 mg, Oral, DAILY    dulaglutide (TRULICITY) 1.5 mg, SubCUTAneous, EVERY 7 DAYS    glucose blood VI test strips (True Metrix Glucose Test Strip) strip Use to test blood sugar once daily    hydrOXYzine pamoate (VISTARIL) 50 mg capsule TAKE 1 CAPSULE BY MOUTH 4 TIMES A DAY AS NEEDED FOR ANXIETY    insulin glargine (Lantus Solostar U-100 Insulin) 100 unit/mL (3 mL) inpn Inject 10 units subcutaneously every night at bedtime.    Insulin Needles, Disposable, 31 gauge x 5/16" ndle Use twice daily for insulin and victoza injections.    lancets misc Use to test blood sugar once daily    Latuda 40 mg tab tablet 1 Tablet, Oral, EVERY BEDTIME    levothyroxine (SYNTHROID) 200 mcg, Oral,  DAILY BEFORE BREAKFAST    linaCLOtide (LINZESS) 145 mcg, Oral    metFORMIN (GLUCOPHAGE) 1,000 mg, Oral, 2 TIMES DAILY WITH MEALS    methocarbamoL (ROBAXIN) 750 mg, Oral, 4 TIMES DAILY AS NEEDED    nitroglycerin (NITROSTAT) 0.4 mg, SubLINGual, EVERY 5 MIN AS NEEDED, Up to 3 doses.    olmesartan (BENICAR) 40 mg tablet TAKE 1 TABLET BY MOUTH EVERY DAY    PARoxetine (PAXIL) 40 mg, Oral, EVERY BEDTIME    SITagliptin (JANUVIA) 100 mg, Oral, DAILY    traZODone (DESYREL) 50 mg tablet TAKE 1 TABLET BY MOUTH AT BEDTIME AS NEEDED FOR SLEEP      Allergies   Allergen Reactions    Vicodin [Hydrocodone-Acetaminophen] Itching    Lisinopril-Hydrochlorothiazide Other (comments)     Dry cough     Pcn [Penicillins] Other (comments)      Objective   There were no vitals taken for this visit.  Physical Exam  Nursing note reviewed.   Constitutional:       General: She is not in acute distress.  Pulmonary:      Effort: Pulmonary effort is normal. No respiratory distress.   Neurological:      Mental Status: She is alert and oriented to person, place, and time.   Psychiatric:         Mood and Affect: Mood normal.          Behavior: Behavior normal.        On this date 08/23/2021 I have spent 42 minutes reviewing previous notes, test results and face to face with the patient discussing the diagnosis and importance of compliance with the treatment plan as well as documenting on the day of the visit.    Ginger Organ, who was evaluated through a synchronous (real-time) audio-video encounter, and/or her healthcare decision maker, is aware that it is a billable service, which includes applicable co-pays, with coverage as determined by her insurance carrier. She provided verbal consent to proceed and patient identification was verified. This visit was conducted pursuant to the emergency declaration under the [place] waiver authority and the [employer] Act. A caregiver was present when appropriate. Ability to conduct physical exam was limited. The patient was located at home in a state where the provider was licensed to provide care.   Fayne Norrie, DO  Family Medicine  08/23/2021

## 2021-08-24 ENCOUNTER — Encounter: Attending: Cardiovascular Disease | Primary: Family Medicine

## 2021-09-06 ENCOUNTER — Encounter

## 2021-09-06 MED ORDER — METFORMIN 1,000 MG TAB
1000 mg | ORAL_TABLET | Freq: Two times a day (BID) | ORAL | 1 refills | Status: AC
Start: 2021-09-06 — End: 2021-12-05

## 2021-09-06 NOTE — Telephone Encounter (Signed)
Last seen: 08/23/2021 - Virtual Visit    Last filled: 08/18/2020 qty. 180 w/ 1 refill by NP D'Angelo.    Future Appointments   Date Time Provider Department Center   09/15/2021  7:15 AM Negron, Kirstin H, DO [facility]

## 2021-09-15 ENCOUNTER — Ambulatory Visit
Admit: 2021-09-15 | Discharge: 2021-09-15 | Payer: PRIVATE HEALTH INSURANCE | Attending: Family Medicine | Primary: Family Medicine

## 2021-09-15 DIAGNOSIS — E119 Type 2 diabetes mellitus without complications: Secondary | ICD-10-CM

## 2021-09-15 MED ORDER — TIZANIDINE 4 MG TAB
4 mg | ORAL_TABLET | ORAL | 1 refills | Status: AC
Start: 2021-09-15 — End: ?

## 2021-09-15 MED ORDER — ONDANSETRON (PF) 4 MG/2 ML INJECTION
4 mg/2 mL | Freq: Once | INTRAMUSCULAR | Status: AC
Start: 2021-09-15 — End: 2021-09-15
  Administered 2021-09-15: 14:00:00 via INTRAMUSCULAR

## 2021-09-15 MED ORDER — KETOROLAC TROMETHAMINE 30 MG/ML INJECTION
30 mg/mL (1 mL) | Freq: Once | INTRAMUSCULAR | 0 refills | Status: AC
Start: 2021-09-15 — End: 2021-09-15

## 2021-09-15 MED ORDER — GABAPENTIN 300 MG CAP
300 mg | ORAL_CAPSULE | Freq: Two times a day (BID) | ORAL | 0 refills | Status: AC | PRN
Start: 2021-09-15 — End: 2021-10-19

## 2021-09-15 MED ORDER — QULIPTA 60 MG TABLET
60 mg | ORAL_TABLET | Freq: Every day | ORAL | 6 refills | Status: AC
Start: 2021-09-15 — End: 2021-11-25

## 2021-09-15 NOTE — Progress Notes (Signed)
Christy [name] (DOB: [birth year]) is a 45 y.o. female, PCP Malijah Lietz H, DO, here for URGENT VISIT to address:    ICD-10-CM ICD-9-CM   1. Type 2 diabetes mellitus without complication, with long-term current use of insulin (HCC)  E11.9 250.00    Z79.4 V58.67   2. Chronic migraine without aura with status migrainosus, not intractable  G43.701 346.72   3. Pain in both lower extremities  M79.604 729.5    M79.605    4. Thoracic degenerative disc disease  M51.34 722.51     Assessment   Plan     #Chronic mid back pain 2/2 Spinal stenosis x Years - Uncontrolled  Patient reports previously seen by PT, Ortho, Spinal surgeon, Pain management. Reports had injections before for back without any relief.   Previously on Flexeril without notable benefit.  Lyrica without relief.   Rec review Lumbar spine 12/11/18 - No spondylolisthesis. No compression deformity. Mild endplate spondylosis anteriorly, stable. No significant loss of discal height. Facets are intact. No spondylolysis.   Rec review Thoracic spine 09/18/18 - Mild multilevel spinal degenerative changes.   Last MRI >1year ago   Plan to try alternative Tizanidine and Gabapentin to help manage symptoms.     #Migraines  - uncontrolled  Previously saw Neuro for management  Previously used injection CGRP inhibitors (great relief), nasal triptan, PO triptan, Topamax  Reviewed risk/benefits of preventative vs abortive therapy for migraine treatment and given debilitation and interested to see coverage for restarting CGRP therapy (will [facility])     #GERD - Reviewed behavioral and dietary changes to reduce GERD symptoms. Handout provided.   #Hx of IBS  #S/p cholecystectomy 2009  Suspect #constipation worsened by #dehydration contributing to above  Newly established with GI and started on Linzess. Rec review last visit 06/27/21  Last EGD/Colonoscopy 2022  Rec review CT Abd/Pelv 03/30/21 with moderate stool burden, mild hepatic steatosis  Reviewed  can consider increase in dose of Linzess if limited improvement on current dosing.      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             Lab Results   Component Value Date/Time     WBC 7.7 03/16/2021 09:04 AM     HGB 10.8 (L) 03/16/2021 09:04 AM     HCT 33.6 (L) 03/16/2021 09:04 AM     PLATELET 232 03/16/2021 09:04 AM     MCV 87.7 03/16/2021 09:04 AM            Lab Results   Component Value Date/Time     Ferritin 114 03/24/2021 09:51 AM      #Vit B12 Def - reviewed dx and recommended supplement dosing  Lab Results   Component Value Date/Time    Vitamin B12 202 (L) 03/16/2021 09:04 AM     #Vit D Def - reviewed dx and recommended supplement dosing  Lab Results   Component Value Date/Time    Vitamin D 25-Hydroxy 18.1 (L) 03/16/2021 09:04 AM       #NIDDM2 - Goal A1C <7  Hypoglycemia? Denies  Limited improvement despite good compliance with Victoza; Plan to titrate up Trulicity   Tolerating other diabetes medications without notable AE, will continue as described below     Last Point of Care HGB A1C  Hemoglobin A1c (POC)   Date Value Ref Range Status   07/13/2021 8.5 % Final  Key Antihyperglycemic Medications               metFORMIN (GLUCOPHAGE) 1,000 mg tablet (Taking) Take 1 Tablet by mouth two (2) times daily (with meals) for 90 days. Indications: type 2 diabetes mellitus    dapagliflozin (FARXIGA) 10 mg tab tablet (Taking) Take 1 Tablet by mouth daily.    SITagliptin (Januvia) 100 mg tablet (Taking) Take 1 Tablet by mouth daily. Indications: type 2 diabetes mellitus    insulin glargine (Lantus Solostar U-100 Insulin) 100 unit/mL (3 mL) inpn (Taking) Inject 10 units subcutaneously every night at bedtime.    dulaglutide (TRULICITY) 1.5 HG/9.9 mL sub-q pen 0.5 mL by SubCUTAneous route every seven (7) days. Indications: type 2 diabetes mellitus          #HTN - Goal <130/80  BP cuff at home? Yes, reports home average closer to  120-130/80  Reviewed goals of BP and encouraged to monitor and if blood pressure measurements at home are close to or higher than 140/90 frequently then we should make adjustments to medication regimen. Reviewed risk of stroke or cardiovascular changes with persistently elevated BP.    Tolerating medications without notable AE  Established with Cardio - last visit 06/08/21     BP Readings from Last 3 Encounters:   09/15/21 (!) 140/90   07/13/21 (!) 161/100   06/08/21 130/88     #HLD - Goal < 70  Tolerating medications without notable AE, will continue regimen unchanged              Lab Results   Component Value Date/Time     LDL-CHOLESTEROL 89 10/22/2019 12:00 AM     LDL, calculated 184 (H) 03/16/2021 09:04 AM      The 10-year ASCVD risk score Mikey Bussing DC Jr., et al., 2013) is: 28.1%    Values used to calculate the score:      Age: 26 years      Sex: Female      Is Non-Hispanic African American: Yes      Diabetic: Yes      Tobacco smoker: Yes      Systolic Blood Pressure: 242 mmHg      Is BP treated: Yes      HDL Cholesterol: 56 MG/DL      Total Cholesterol: 272 MG/DL     Key CAD CHF Meds               cloNIDine HCL (CATAPRES) 0.3 mg tablet (Taking) Take 1 Tablet by mouth two (2) times a day.    nitroglycerin (NITROSTAT) 0.4 mg SL tablet (Taking) 1 Tablet by SubLINGual route every five (5) minutes as needed for Chest Pain. Up to 3 doses.    olmesartan (BENICAR) 40 mg tablet (Taking) TAKE 1 TABLET BY MOUTH EVERY DAY    aspirin delayed-release 81 mg tablet (Taking) 81 mg.    atorvastatin (Lipitor) 40 mg tablet Take 1 Tablet by mouth nightly for 90 days. Indications: high cholesterol          #Acquired Hypothyroidism - well controlled   Thyroid antibodies? Negative May 2022  Thyroid U/S? Never done  Tolerating medications without notable AE, will continue regimen unchanged               Lab Results   Component Value Date/Time     TSH 3.71 03/16/2021 09:04 AM     T4, Free 1.1 03/16/2021 09:04 AM      #Body mass index is  43.27 kg/m??.  Counseled on diet and exercise methods. Positive reinforcement provided.   Interested in referral to [place], ordered 07/13/21    Wt Readings from Last 3 Encounters:   09/15/21 230 lb (104.3 kg)   07/13/21 223 lb 12.8 oz (101.5 kg)   06/08/21 230 lb 3.2 oz (104.4 kg)           Orders Placed This Encounter    REFERRAL TO PODIATRY     Referral Priority:   Routine     Referral Type:   Consultation     Referral Reason:   Specialty Services Required     Number of Visits Requested:   1    [facility]     Referral Priority:   Routine     Referral Type:   Consultation     Referral Reason:   Specialty Services Required     Referred to Provider:   Candi Leash, MD     Number of Visits Requested:   1    KETOROLAC TROMETHAMINE INJ     Order Specific Question:   Dose     Answer:   52m     Order Specific Question:   Site     Answer:   RIGHT GLUTEUS     Order Specific Question:   Expiration Date     Answer:   09/28/2022     Order Specific Question:   Lot#     Answer:   [id number]    Order Specific Question:   Manufacturer     Answer:   [employer]    Order Specific Question:   Charge Quantity?     Answer:   2     Order Specific Question:   Perfomed by/Witnessed by:     Answer:   Rolyn Chism, CCMA     Order Specific Question:   NDC#     Answer:   72266-118-01 [[[id number]]   PR THER/PROPH/DIAG INJECTION, SUBCUT/IM    DISCONTD: cyclobenzaprine (FLEXERIL) 10 mg tablet    pantoprazole (PROTONIX) 40 mg tablet     Sig: Take 40 mg by mouth two (2) times a day.    tiZANidine (ZANAFLEX) 4 mg tablet     Sig: Take 1 tablet up to 3 times daily x 1 week. After 1 week, can continue up to 2 times daily as needed for muscle spasm. If groggy, limit use to nightly only or try 1/2 tablet.     Dispense:  30 Tablet     Refill:  1    ondansetron (ZOFRAN) injection 4 mg    ketorolac (TORADOL) 30 mg/mL (1 mL) injection     Sig: 1 mL by IntraVENous route once for 1 dose.     Dispense:  1 mL     Refill:  0     gabapentin (NEURONTIN) 300 mg capsule     Sig: Take 1 Capsule by mouth two (2) times daily as needed for Pain. Max Daily Amount: 600 mg. Indications: neuropathic pain     Dispense:  60 Capsule     Refill:  0    atogepant (Qulipta) 60 mg tab     Sig: Take 60 mg by mouth daily. Indications: migraine prevention     Dispense:  30 Tablet     Refill:  6     Previously saw Neuro for migraines. Previously used injection CGRP inhibitors, nasal triptan, PO triptan, Topamax     Return in about 6  weeks (around 10/27/2021) for 30 min follow up, reevaluate pain.    Subjective See A/P     Current Outpatient Medications   Medication Instructions    aspirin delayed-release 81 mg    atorvastatin (LIPITOR) 40 mg, Oral, EVERY BEDTIME    Blood-Glucose Meter (True Metrix Glucose Meter) misc 1 Kit, Does Not Apply, DAILY    buPROPion (WELLBUTRIN) 75 mg, Oral, 2 TIMES DAILY    cloNIDine HCL (CATAPRES) 0.3 mg, Oral, 2 TIMES DAILY    dapagliflozin (FARXIGA) 10 mg, Oral, DAILY    dulaglutide (TRULICITY) 1.5 mg, SubCUTAneous, EVERY 7 DAYS    gabapentin (NEURONTIN) 300 mg, Oral, 2 TIMES DAILY AS NEEDED    glucose blood VI test strips (True Metrix Glucose Test Strip) strip Use to test blood sugar once daily    hydrOXYzine pamoate (VISTARIL) 50 mg capsule TAKE 1 CAPSULE BY MOUTH 4 TIMES A DAY AS NEEDED FOR ANXIETY    insulin glargine (Lantus Solostar U-100 Insulin) 100 unit/mL (3 mL) inpn Inject 10 units subcutaneously every night at bedtime.    Insulin Needles, Disposable, 31 gauge x 5/16" ndle Use twice daily for insulin and victoza injections.    ketorolac (TORADOL) 30 mg, IntraVENous, ONCE    lancets misc Use to test blood sugar once daily    Latuda 40 mg tab tablet 1 Tablet, Oral, EVERY BEDTIME    levothyroxine (SYNTHROID) 200 mcg, Oral, DAILY BEFORE BREAKFAST    linaCLOtide (LINZESS) 145 mcg, Oral    metFORMIN (GLUCOPHAGE) 1,000 mg, Oral, 2 TIMES DAILY WITH MEALS    methocarbamoL (ROBAXIN) 750 mg, Oral, 4 TIMES DAILY AS NEEDED     nitroglycerin (NITROSTAT) 0.4 mg, SubLINGual, EVERY 5 MIN AS NEEDED, Up to 3 doses.    olmesartan (BENICAR) 40 mg tablet TAKE 1 TABLET BY MOUTH EVERY DAY    pantoprazole (PROTONIX) 40 mg, Oral, 2 TIMES DAILY    PARoxetine (PAXIL) 40 mg, Oral, EVERY BEDTIME    Qulipta 60 mg, Oral, DAILY    SITagliptin (JANUVIA) 100 mg, Oral, DAILY    tiZANidine (ZANAFLEX) 4 mg tablet Take 1 tablet up to 3 times daily x 1 week. After 1 week, can continue up to 2 times daily as needed for muscle spasm. If groggy, limit use to nightly only or try 1/2 tablet.    traZODone (DESYREL) 50 mg tablet TAKE 1 TABLET BY MOUTH AT BEDTIME AS NEEDED FOR SLEEP     Allergies   Allergen Reactions    Vicodin [Hydrocodone-Acetaminophen] Itching    Lisinopril-Hydrochlorothiazide Other (comments)     Dry cough     Pcn [Penicillins] Other (comments)      Objective   Visit Vitals  BP (!) 140/90 (BP 1 Location: Left upper arm, BP Patient Position: Sitting, BP Cuff Size: Large adult)   Pulse (!) 108   Temp 97.4 ??F (36.3 ??C) (Temporal)   Resp 16   Ht _0  (1.549 m)   Wt 230 lb (104.3 kg)   SpO2 100%   BMI 43.46 kg/m??     Physical Exam  Vitals and nursing note reviewed.   Constitutional:       General: She is in acute distress.      Interventions: Face mask in place.   Cardiovascular:      Rate and Rhythm: Tachycardia present.      Pulses: Normal pulses.   Pulmonary:      Effort: Pulmonary effort is normal. No respiratory distress.   Neurological:  Mental Status: She is alert and oriented to person, place, and time.      Sensory: No sensory deficit.      Motor: No weakness.      Gait: Gait abnormal.   Psychiatric:         Mood and Affect: Mood normal.         Behavior: Behavior normal.        On this date 09/15/2021 I have spent 45 minutes reviewing previous notes, test results and face to face with the patient discussing the diagnosis and importance of compliance with the treatment plan as well as documenting on the day of the visit.    Fayne Norrie,  DO  Family Medicine  09/15/2021
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
## 2021-09-27 NOTE — Telephone Encounter (Signed)
DMV Placard has been filled out for the pt. Waiting for Dr. Lutricia Horsfall to sign the form.

## 2021-09-27 NOTE — Telephone Encounter (Signed)
-----   Message from Reyes Ivan, DO sent at 09/15/2021  8:53 AM EST -----  Regarding: Requesting DMV Placard  Can you help me fill out #[id number] DMV placard for patient?     Indication: chronic back pain with bilateral sciatica

## 2021-09-29 NOTE — Telephone Encounter (Signed)
Called the phone number on file but unable to reach the pt. Will try again later.

## 2021-10-11 ENCOUNTER — Encounter

## 2021-10-12 NOTE — Telephone Encounter (Signed)
Last filled:04/28/21  Last seen:09/15/21  Future Appointments   Date Time Provider Department Center   10/19/2021 11:00 AM Negron, Kirstin H, DO [facility]     Dispense Quantity: 53ml     Refills: 3

## 2021-10-13 MED ORDER — DULAGLUTIDE 3 MG/0.5 ML SUBCUTANEOUS PEN INJECTOR
3 mg/0.5 mL | SUBCUTANEOUS | 0 refills | Status: DC
Start: 2021-10-13 — End: 2021-10-19

## 2021-10-13 MED ORDER — INSULIN GLARGINE 100 UNIT/ML (3 ML) SUB-Q PEN
100 unit/mL (3 mL) | SUBCUTANEOUS | 3 refills | Status: DC
Start: 2021-10-13 — End: 2021-10-19

## 2021-10-19 ENCOUNTER — Ambulatory Visit
Admit: 2021-10-19 | Discharge: 2021-10-19 | Payer: PRIVATE HEALTH INSURANCE | Attending: Family Medicine | Primary: Family Medicine

## 2021-10-19 DIAGNOSIS — E119 Type 2 diabetes mellitus without complications: Secondary | ICD-10-CM

## 2021-10-19 LAB — AMB POC URINE, MICROALBUMIN, SEMIQUANT (3 RESULTS)
ALBUMIN, URINE POC: 80 mg/L
Albumin, Urine POC: 80 mg/L
CREATININE, URINE POC: 200 mg/dL
Creatinine, Urine, POC: 200 mg/dL
Microalb/Creat Ratio POC: <30 MG/G (ref <30)
Microalbumin/creat ratio (POC): <30 MG/G (ref <30)

## 2021-10-19 LAB — AMB POC HEMOGLOBIN A1C
Hemoglobin A1C, POC: 7.8 %
Hemoglobin A1c (POC): 7.8 %

## 2021-10-19 MED ORDER — SEMAGLUTIDE 2 MG/DOSE (8 MG/3 ML) SUBCUTANEOUS PEN INJECTOR
2 mg/dose (8 mg/3 mL) | SUBCUTANEOUS | 5 refills | Status: DC
Start: 2021-10-19 — End: 2021-12-04

## 2021-10-19 MED ORDER — PREGABALIN 50 MG CAP
50 mg | ORAL_CAPSULE | Freq: Two times a day (BID) | ORAL | 0 refills | Status: AC | PRN
Start: 2021-10-19 — End: ?

## 2021-10-19 MED ORDER — LEVOTHYROXINE 200 MCG TAB
200 mcg | ORAL_TABLET | Freq: Every day | ORAL | 1 refills | Status: AC
Start: 2021-10-19 — End: ?

## 2021-10-19 MED ORDER — INSULIN GLARGINE 100 UNIT/ML (3 ML) SUB-Q PEN
100 unit/mL (3 mL) | SUBCUTANEOUS | 5 refills | Status: DC
Start: 2021-10-19 — End: 2021-11-25

## 2021-10-19 NOTE — Progress Notes (Signed)
Christy [name] (DOB: [birth year]) is a 45 y.o. female, ESTABLISHED patient, here for FOLLOW UP:    ICD-10-CM ICD-9-CM   1. Type 2 diabetes mellitus without complication, with long-term current use of insulin (HCC)  E11.9 250.00    Z79.4 V58.67   2. Class 3 severe obesity due to excess calories with serious comorbidity and body mass index (BMI) of 40.0 to 44.9 in adult (HCC)  E66.01 278.01    Z68.41 V85.41   3. Moderate concentric left ventricular hypertrophy  I51.7 429.3   4. Right-sided heart failure, unspecified HF chronicity (HCC)  I50.810 428.0   5. Peripheral edema  R60.9 782.3   6. Spinal stenosis of lumbar region, unspecified whether neurogenic claudication present  M48.061 724.02   7. HTN, goal below 130/80  I10 401.9   8. Hyperlipidemia with target low density lipoprotein (LDL) cholesterol less than 70 mg/dL  E78.5 272.4     Assessment  Plan    #Chronic mid back pain 2/2 Spinal stenosis x Years - Uncontrolled  Patient reports previously seen by PT, Ortho, Spinal surgeon, Pain management. Reports had injections before for back without any relief.   Rec review Lumbar spine 12/11/18 - No spondylolisthesis. No compression deformity. Mild endplate spondylosis anteriorly, stable. No significant loss of discal height. Facets are intact. No spondylolysis.   Rec review Thoracic spine 09/18/18 - Mild multilevel spinal degenerative changes.   Last MRI >1year ago   Previously tried Flexeril, Gabapentin up to 1266m BID without notable benefit.   Plan to try alternative Tizanidine and Lyrica to help manage symptoms.     #NIDDM2 - Goal A1C <7  Hypoglycemia? Denies  Limited improvement despite good compliance with Victoza, Trulicity; Plan to switch to alternative of Semaglutide 238mand D/C Lantus which I think is contributing to difficulty with weight loss despite good reduction in appetite   Tolerating other diabetes medications without notable AE, will continue as described below    Last Point of Care HGB A1C  Hemoglobin  A1c (POC)   Date Value Ref Range Status   10/19/2021 7.8 % Final      Key Antihyperglycemic Medications               semaglutide 2 mg/dose (8 mg/3 mL) pnij (Taking) 2 mg by SubCUTAneous route every seven (7) days. Indications: type 2 diabetes mellitus, BMI >30    insulin glargine (Lantus Solostar U-100 Insulin) 100 unit/mL (3 mL) inpn (Taking) Inject 10 units subcutaneously every night at bedtime.  Indications: type 2 diabetes mellitus    metFORMIN (GLUCOPHAGE) 1,000 mg tablet (Taking) Take 1 Tablet by mouth two (2) times daily (with meals) for 90 days. Indications: type 2 diabetes mellitus    dapagliflozin (FARXIGA) 10 mg tab tablet (Taking) Take 1 Tablet by mouth daily.    SITagliptin (Januvia) 100 mg tablet (Taking) Take 1 Tablet by mouth daily. Indications: type 2 diabetes mellitus          #Peripheral edema - limited response to elevation  Cannot r/o structural heart changes contributing, plan to reeval Echo and encouraged to schedule follow up with Cardiology to review (Patient was sent for stress testing but unable to complete due to insurance)  Provided handout regarding optimal use of conservative treatments for venous reflux to try prior to visit     #Mod concentric hypertrophy with #reduced systolic function of right ventricle   #HTN - Goal <130/80  BP cuff at home? Yes, reports home average closer to 120-130/80  Reviewed goals  of BP and encouraged to monitor and if blood pressure measurements at home are close to or higher than 140/90 frequently then we should make adjustments to medication regimen. Reviewed risk of stroke or cardiovascular changes with persistently elevated BP.    Tolerating medications without notable AE; Future consideration: addition of diuretic (olmesartan-hctz combo?)  Established with Cardio - rec review last visit      BP Readings from Last 3 Encounters:   10/19/21 (!) 154/107   09/15/21 (!) 140/90   07/13/21 (!) 161/100     #HLD - Goal < 70  Tolerating medications without  notable AE, will continue regimen unchanged              Lab Results   Component Value Date/Time     LDL-CHOLESTEROL 89 10/22/2019 12:00 AM     LDL, calculated 184 (H) 03/16/2021 09:04 AM      The 10-year ASCVD risk score Mikey Bussing DC Jr., et al., 2013) is: 28.1%    Values used to calculate the score:      Age: 38 years      Sex: Female      Is Non-Hispanic African American: Yes      Diabetic: Yes      Tobacco smoker: Yes      Systolic Blood Pressure: 841 mmHg      Is BP treated: Yes      HDL Cholesterol: 56 MG/DL      Total Cholesterol: 272 MG/DL     Key CAD CHF Meds               cloNIDine HCL (CATAPRES) 0.3 mg tablet (Taking) Take 1 Tablet by mouth two (2) times a day.    nitroglycerin (NITROSTAT) 0.4 mg SL tablet (Taking) 1 Tablet by SubLINGual route every five (5) minutes as needed for Chest Pain. Up to 3 doses.    olmesartan (BENICAR) 40 mg tablet (Taking) TAKE 1 TABLET BY MOUTH EVERY DAY    atorvastatin (Lipitor) 40 mg tablet (Taking) Take 1 Tablet by mouth nightly for 90 days. Indications: high cholesterol    aspirin delayed-release 81 mg tablet (Taking) 81 mg.          #Acquired Hypothyroidism - well controlled   Thyroid antibodies? Negative May 2022  Thyroid U/S? Never done  Tolerating medications without notable AE, will continue regimen unchanged               Lab Results   Component Value Date/Time     TSH 3.71 03/16/2021 09:04 AM     T4, Free 1.1 03/16/2021 09:04 AM      #Migraines  - uncontrolled  Previously saw Neuro for management  Previously used injection CGRP inhibitors (great relief), nasal triptan, PO triptan, Topamax  Reviewed risk/benefits of preventative vs abortive therapy for migraine treatment and given debilitation and interested to see coverage for restarting CGRP therapy (will [facility] to [facility])     #GERD - Reviewed behavioral and dietary changes to reduce GERD symptoms. Handout provided.   #Hx of IBS  #S/p cholecystectomy 2009  Suspect #constipation worsened by  #dehydration contributing to above  Newly established with GI and started on Linzess. Rec review last visit 06/27/21  Last EGD/Colonoscopy 2022  Rec review CT Abd/Pelv 03/30/21 with moderate stool burden, mild hepatic steatosis  Reviewed can consider increase in dose of Linzess if limited improvement on current dosing.      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             Lab Results   Component Value Date/Time     WBC 7.7 03/16/2021 09:04 AM     HGB 10.8 (L) 03/16/2021 09:04 AM     HCT 33.6 (L) 03/16/2021 09:04 AM     PLATELET 232 03/16/2021 09:04 AM     MCV 87.7 03/16/2021 09:04 AM            Lab Results   Component Value Date/Time     Ferritin 114 03/24/2021 09:51 AM      #Vit B12 Def - reviewed dx and recommended supplement dosing  Lab Results   Component Value Date/Time    Vitamin B12 202 (L) 03/16/2021 09:04 AM     #Vit D Def - reviewed dx and recommended supplement dosing  Lab Results   Component Value Date/Time    Vitamin D 25-Hydroxy 18.1 (L) 03/16/2021 09:04 AM       #Body mass index is 43.27 kg/m??.  Counseled on diet and exercise methods. Positive reinforcement provided.   Interested in referral to [place], ordered 07/13/21  Wt Readings from Last 3 Encounters:   10/19/21 230 lb (104.3 kg)   09/15/21 230 lb (104.3 kg)   07/13/21 223 lb 12.8 oz (101.5 kg)           Orders Placed This Encounter    AMB POC HEMOGLOBIN A1C    AMB POC URINE, MICROALBUMIN, SEMIQUANT (3 RESULTS)    semaglutide 2 mg/dose (8 mg/3 mL) pnij     Sig: 2 mg by SubCUTAneous route every seven (7) days. Indications: type 2 diabetes mellitus, BMI >30     Dispense:  3 Each     Refill:  5    insulin glargine (Lantus Solostar U-100 Insulin) 100 unit/mL (3 mL) inpn     Sig: Inject 10 units subcutaneously every night at bedtime.  Indications: type 2 diabetes mellitus     Dispense:  90 mL     Refill:  5    levothyroxine (SYNTHROID) 200 mcg tablet     Sig: Take 1 Tablet by  mouth Daily (before breakfast). Indications: a condition with low thyroid hormone levels     Dispense:  90 Tablet     Refill:  1    pregabalin (LYRICA) 50 mg capsule     Sig: Take 1 Capsule by mouth two (2) times daily as needed (nerve pain). Max Daily Amount: 200 mg. After 1 week, if no side effects and limited benefit, can increase dose to 2 capsules by mouth twice daily.     Dispense:  180 Capsule     Refill:  0     Return in about 3 months (around 01/17/2022) for 30 min follow up, BP check, A1C check.    Subjective  Last PCP visit: 09/15/2021  Since last visit:   Any changes in health, procedures, hospital visits, or specialist visits? See A/P  Tolerating medications without adverse reactions? Reports good compliance with Rx without notable adverse effects.       Current Outpatient Medications   Medication Instructions    aspirin delayed-release 81 mg    atorvastatin (LIPITOR) 40 mg, Oral, EVERY BEDTIME    Blood-Glucose Meter (True Metrix Glucose Meter) misc 1 Kit, Does Not Apply, DAILY    buPROPion (WELLBUTRIN) 75 mg, Oral, 2 TIMES DAILY    cloNIDine HCL (CATAPRES) 0.3 mg, Oral, 2 TIMES DAILY    dapagliflozin (FARXIGA) 10  mg, Oral, DAILY    glucose blood VI test strips (True Metrix Glucose Test Strip) strip Use to test blood sugar once daily    hydrOXYzine pamoate (VISTARIL) 50 mg capsule TAKE 1 CAPSULE BY MOUTH 4 TIMES A DAY AS NEEDED FOR ANXIETY    insulin glargine (Lantus Solostar U-100 Insulin) 100 unit/mL (3 mL) inpn Inject 10 units subcutaneously every night at bedtime.    Insulin Needles, Disposable, 31 gauge x 5/16" ndle Use twice daily for insulin and victoza injections.    lancets misc Use to test blood sugar once daily    Latuda 40 mg tab tablet 1 Tablet, Oral, EVERY BEDTIME    levothyroxine (SYNTHROID) 200 mcg, Oral, DAILY BEFORE BREAKFAST    linaCLOtide (LINZESS) 145 mcg, Oral    metFORMIN (GLUCOPHAGE) 1,000 mg, Oral, 2 TIMES DAILY WITH MEALS    methocarbamoL (ROBAXIN) 750 mg, Oral, 4 TIMES DAILY AS  NEEDED    nitroglycerin (NITROSTAT) 0.4 mg, SubLINGual, EVERY 5 MIN AS NEEDED, Up to 3 doses.    olmesartan (BENICAR) 40 mg tablet TAKE 1 TABLET BY MOUTH EVERY DAY    pantoprazole (PROTONIX) 40 mg, Oral, 2 TIMES DAILY    PARoxetine (PAXIL) 40 mg, Oral, EVERY BEDTIME    pregabalin (LYRICA) 50 mg, Oral, 2 TIMES DAILY AS NEEDED, After 1 week, if no side effects and limited benefit, can increase dose to 2 capsules by mouth twice daily.    Qulipta 60 mg, Oral, DAILY    semaglutide 2 mg, SubCUTAneous, EVERY 7 DAYS    SITagliptin phosphate (JANUVIA) 100 mg, Oral, DAILY    tiZANidine (ZANAFLEX) 4 mg tablet Take 1 tablet up to 3 times daily x 1 week. After 1 week, can continue up to 2 times daily as needed for muscle spasm. If groggy, limit use to nightly only or try 1/2 tablet.    traZODone (DESYREL) 50 mg tablet TAKE 1 TABLET BY MOUTH AT BEDTIME AS NEEDED FOR SLEEP      Objective  Visit Vitals  BP (!) 154/107 (BP 1 Location: Left upper arm, BP Patient Position: Sitting, BP Cuff Size: Large adult)   Pulse (!) 109   Temp 97.6 ??F (36.4 ??C) (Temporal)   Resp 16   Ht 5' 1"  (1.549 m)   Wt 230 lb (104.3 kg)   SpO2 100%   BMI 43.46 kg/m??     Physical Exam  Vitals and nursing note reviewed.   Constitutional:       General: She is in acute distress.      Interventions: Face mask in place.   Cardiovascular:      Rate and Rhythm: Tachycardia present.      Pulses: Normal pulses.   Pulmonary:      Effort: Pulmonary effort is normal. No respiratory distress.   Neurological:      Mental Status: She is alert and oriented to person, place, and time.      Sensory: No sensory deficit.      Motor: No weakness.      Gait: Gait abnormal.   Psychiatric:         Mood and Affect: Mood normal.         Behavior: Behavior normal.          Fayne Norrie, DO  Family Medicine  10/19/2021

## 2021-10-19 NOTE — Progress Notes (Signed)
 Christy [name] is a 45 y.o. female (DOB: 30-Jan-1976) presenting to address:    Chief Complaint   Patient presents with    Pain (Chronic)     Re-evaluate pain       There were no vitals filed for this visit.    Hearing/Vision:   No results found.    Learning Assessment:   No flowsheet data found.  Depression Screening:     3 most recent PHQ Screens 10/19/2021   PHQ Not Done -   Little interest or pleasure in doing things Not at all   Feeling down, depressed, irritable, or hopeless Not at all   Total Score PHQ 2 0   Trouble falling or staying asleep, or sleeping too much -   Feeling tired or having little energy -   Poor appetite, weight loss, or overeating -   Feeling bad about yourself - or that you are a failure or have let yourself or your family down -   Trouble concentrating on things such as school, work, reading, or watching TV -   Moving or speaking so slowly that other people could have noticed; or the opposite being so fidgety that others notice -   Thoughts of being better off dead, or hurting yourself in some way -   PHQ 9 Score -   How difficult have these problems made it for you to do your work, take care of your home and get along with others -     Fall Risk Assessment:     Fall Risk Assessment, last 12 mths 03/24/2021   Able to walk? Yes   Fall in past 12 months? 0   Do you feel unsteady? 0   Are you worried about falling 0     Abuse Screening:     Abuse Screening Questionnaire 03/24/2021   Do you ever feel afraid of your partner? N   Are you in a relationship with someone who physically or mentally threatens you? N   Is it safe for you to go home? Y     ADL Assessment:   No flowsheet data found.     Coordination of Care Questionaire:   1. Have you been to the ER, urgent care clinic since your last visit?  Hospitalized since your last visit? No    2. Have you seen or consulted any other health care providers outside of the [facility] since your last visit? No     3. For patients aged  30-75: Has the patient had a colonoscopy / FIT/ Cologuard? Yes - no Care Gap present      If the patient is female:    4. For patients aged 58-74: Has the patient had a mammogram within the past 2 years? Yes - no Care Gap present  See top three    5. For patients aged 21-65: Has the patient had a pap smear? No    Advanced Directive:   1. Do you have an Advanced Directive? NO    2. Would you like information on Advanced Directives? NO

## 2021-10-19 NOTE — Progress Notes (Signed)
Reviewed in office with Patient

## 2021-10-21 NOTE — Telephone Encounter (Signed)
DMV form was handed to the pt at her last visit on 12/21. No additional information is needed.

## 2021-10-27 ENCOUNTER — Encounter: Attending: Family Medicine | Primary: Family Medicine

## 2021-11-25 ENCOUNTER — Ambulatory Visit: Attending: Family | Primary: Family Medicine

## 2021-11-25 ENCOUNTER — Ambulatory Visit
Admit: 2021-11-25 | Discharge: 2021-11-25 | Payer: PRIVATE HEALTH INSURANCE | Attending: Family | Primary: Family Medicine

## 2021-11-25 DIAGNOSIS — Z7189 Other specified counseling: Secondary | ICD-10-CM

## 2021-11-25 NOTE — Progress Notes (Signed)
Bariatric Surgery Consultation    CC: Consultation regarding surgical treatment options for morbid obesity and related comorbidities  Subjective:     The patient is a 46 y.o. obese African American female with a Body mass index is 42.32 kg/m??. They have been considering surgery for some time now. The patient presents to the clinic today to discuss surgical weight loss options. They have made multiple attempts at weight loss over the years without success. They have tried low-carb, low calorie, high-protein diets, slim fast, and actively working out. Currently on Ozempic for DM and weight loss. Unfortunately, none of these have lead to meaningful, sustained weight loss. They have attended the bariatric seminar before the visit.     Occasional nausea and dysphagia, about once a week. No vomiting.   Daily reflux, takes Protonix     Sleep Apnea assessment:    STOPBANG questionnaire     Do you Snore loudly? Yes  Do you often feel Tired, fatigued, or sleepy during the daytime? Yes  Has anyone Observed you stop breathing during your sleep? Yes  Are you being treated for high blood Pressure? Yes  BMI more than 35 kg/m2? Yes  Age over 49 years old? No  Neck Circumference >16 inches? Yes  Gender female? No   ______________________________________     SCORE: 6     If YES to 0 - 2, low risk of sleep apnea  If YES to 3 - 4 intermediate risk of having sleep apnea  If YES to 5 - 8 high risk of having sleep apnea (or 2 + BMI 35 or Neck > 17" or Female)     Pre op weight: 224  EBW: 102  Goal Weight Calc Women: 142.4  Wt loss to date: 0     Patient Active Problem List    Diagnosis Date Noted    History of nuclear stress test 09/18/2020    Palpitations 09/18/2020    Tobacco abuse 09/18/2020    Family history of premature CAD 09/18/2020    Obesity, morbid ([place]) 10/22/2019    Spinal stenosis     IBS (irritable bowel syndrome)     Chest pain 12/10/2012    Dizziness 12/10/2012    CAD (coronary artery disease) 06/06/2011    Hyperlipidemia  with target low density lipoprotein (LDL) cholesterol less than 70 mg/dL 06/06/2011    Hypertension 06/06/2011    Obesity 06/06/2011    DM (diabetes mellitus) ([place]) 06/06/2011    GERD (gastroesophageal reflux disease) 06/06/2011      Past Medical History:   Diagnosis Date    Acid indigestion     Acid reflux     Asthma     CAD (coronary artery disease)     Depression     Diabetes ([place])     Diabetic neuropathy ([facility])     Endocrine disease     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    Enlarged heart     Hypercholesterolemia     Hypertension     IBS (irritable bowel syndrome)     Interstitial cystitis     Sciatica     Sleep apnea     Spinal stenosis     Thyroid disease      Past Surgical History:   Procedure Laterality Date    HX CHOLECYSTECTOMY      2008    HX ORTHOPAEDIC      LEFT KNEE    HX ORTHOPAEDIC      LEFT ARM    HX  PARTIAL HYSTERECTOMY  2003    STILL HAVE OVARIES    HX PELVIC LAPAROSCOPY      HX THYROIDECTOMY        Social History     Tobacco Use    Smoking status: Every Day     Packs/day: 0.25     Types: Cigarettes    Smokeless tobacco: Never   Substance Use Topics    Alcohol use: Yes      Family History   Problem Relation Age of Onset    Diabetes Mother     Heart Disease Mother     Diabetes Father     Cancer Sister     Heart Disease Maternal Aunt     Cancer Paternal Aunt       Prior to Admission medications    Medication Sig Start Date End Date Taking? Authorizing Provider   semaglutide 2 mg/dose (8 mg/3 mL) pnij 2 mg by SubCUTAneous route every seven (7) days. Indications: type 2 diabetes mellitus, BMI >30 10/19/21  Yes Negron, Kirstin H, DO   levothyroxine (SYNTHROID) 200 mcg tablet Take 1 Tablet by mouth Daily (before breakfast). Indications: a condition with low thyroid hormone levels 10/19/21  Yes Negron, Kirstin H, DO   pregabalin (LYRICA) 50 mg capsule Take 1 Capsule by mouth two (2) times daily as needed (nerve pain). Max Daily Amount: 200 mg. After 1 week, if no side effects and limited benefit, can increase dose to  2 capsules by mouth twice daily. 10/19/21  Yes Negron, Kirstin H, DO   pantoprazole (PROTONIX) 40 mg tablet Take 40 mg by mouth two (2) times a day. 09/05/21  Yes Provider, Historical   tiZANidine (ZANAFLEX) 4 mg tablet Take 1 tablet up to 3 times daily x 1 week. After 1 week, can continue up to 2 times daily as needed for muscle spasm. If groggy, limit use to nightly only or try 1/2 tablet. 09/15/21  Yes Negron, Kirstin H, DO   metFORMIN (GLUCOPHAGE) 1,000 mg tablet Take 1 Tablet by mouth two (2) times daily (with meals) for 90 days. Indications: type 2 diabetes mellitus 09/06/21 12/05/21 Yes Negron, Kirstin H, DO   cloNIDine HCL (CATAPRES) 0.3 mg tablet Take 1 Tablet by mouth two (2) times a day. 08/23/21  Yes Negron, Kirstin H, DO   linaCLOtide (LINZESS) 145 mcg cap capsule Take 145 mcg by mouth. 06/20/21  Yes Provider, Historical   PARoxetine (PAXIL) 40 mg tablet Take 1 Tablet by mouth nightly. Indications: depression 07/13/21  Yes Negron, Kirstin H, DO   nitroglycerin (NITROSTAT) 0.4 mg SL tablet 1 Tablet by SubLINGual route every five (5) minutes as needed for Chest Pain. Up to 3 doses. 06/09/21  Yes Ailene Rud, MD   olmesartan (BENICAR) 40 mg tablet TAKE 1 TABLET BY MOUTH EVERY DAY 05/31/21  Yes Ailene Rud, MD   dapagliflozin (FARXIGA) 10 mg tab tablet Take 1 Tablet by mouth daily. 03/24/21  Yes Negron, Kirstin H, DO   traZODone (DESYREL) 50 mg tablet TAKE 1 TABLET BY MOUTH AT BEDTIME AS NEEDED FOR SLEEP 02/21/21  Yes Provider, Historical   hydrOXYzine pamoate (VISTARIL) 50 mg capsule TAKE 1 CAPSULE BY MOUTH 4 TIMES A DAY AS NEEDED FOR ANXIETY 01/05/21  Yes Provider, Historical   glucose blood VI test strips (True Metrix Glucose Test Strip) strip Use to test blood sugar once daily 03/16/21  Yes Negron, Kirstin H, DO   lancets misc Use to test blood sugar once daily 03/16/21  Yes Negron, Kirstin  H, DO   Blood-Glucose Meter (True Metrix Glucose Meter) misc 1 Kit by Does Not Apply route daily. 03/16/21  Yes  Negron, Kirstin H, DO   Insulin Needles, Disposable, 31 gauge x 5/16" ndle Use twice daily for insulin and victoza injections. 03/16/21  Yes Negron, Kirstin H, DO   SITagliptin (Januvia) 100 mg tablet Take 1 Tablet by mouth daily. Indications: type 2 diabetes mellitus 03/16/21  Yes Negron, Kirstin H, DO   Latuda 40 mg tab tablet Take 1 Tablet by mouth nightly. 03/01/21  Yes Provider, Historical   methocarbamoL (ROBAXIN) 750 mg tablet Take 1 Tablet by mouth four (4) times daily as needed for Muscle Spasm(s) or Pain. 11/30/20  Yes Tannous, Ortencia Kick, MD   buPROPion [facility]) 75 mg tablet Take 1 Tablet by mouth two (2) times a day. 09/10/20  Yes Ailene Rud, MD   aspirin delayed-release 81 mg tablet 81 mg.   Yes Provider, Historical   insulin glargine (Lantus Solostar U-100 Insulin) 100 unit/mL (3 mL) inpn Inject 10 units subcutaneously every night at bedtime.  Indications: type 2 diabetes mellitus  Patient not taking: Reported on 11/25/2021 10/19/21 11/25/21  Fayne Norrie, DO   atogepant (Qulipta) 60 mg tab Take 60 mg by mouth daily. Indications: migraine prevention  Patient not taking: Reported on 11/25/2021 09/15/21 11/25/21  Negron, Kirstin H, DO   atorvastatin (Lipitor) 40 mg tablet Take 1 Tablet by mouth nightly for 90 days. Indications: high cholesterol 03/24/21 10/19/21  Negron, Kirstin H, DO     Allergies   Allergen Reactions    Vicodin [Hydrocodone-Acetaminophen] Itching    Lisinopril-Hydrochlorothiazide Other (comments)     Dry cough     Pcn [Penicillins] Other (comments)      Review of Systems:    Constitutional: No fever or chills  Neurologic: No headache  Eyes: No scleral icterus or irritated eyes  Nose: No nasal pain or drainage  Mouth: No oral lesions or sore throat  Cardiac: No palpations or chest pain  Pulmonary: No cough or shortness of breath  Gastrointestinal: No nausea, emesis, diarrhea, or constipation  Genitourinary: No dysuria  Musculoskeletal: No muscle or joint tenderness, hx of arthritis,  spinal stenosis   Skin: No rashes or lesions  Psychiatric: No anxiety or depressed mood    Pertinent positives: See HPI    Objective:     Physical Exam:  Visit Vitals  BP (!) 163/120 (BP 1 Location: Left upper arm, BP Patient Position: Sitting)   Pulse (!) 105   Temp 98.3 ??F (36.8 ??C) (Temporal)   Ht _0  (1.549 m)   Wt 101.6 kg (224 lb)   SpO2 97%   BMI 42.32 kg/m??     General: No acute distress, conversant  Eyes: PERRLA, no scleral icterus  HENT: Normocephalic without oral lesions  Neck: Trachea midline  Cardiac: Normal pulse rate and rhythm, no edema, capillary refill normal 1 second  Pulmonary: Symmetric chest rise with normal effort, clear to auscultation though mildly obscured by body habitus  GI: Soft, NT, ND, no hernia, no splenomegaly  Skin: Warm without rash  Extremities: No edema or joint stiffness, moves all extremities symmetrically, steady gait  Psych: Appropriate mood and affect    Assessment:     Morbid obesity with comorbidities  Plan:   The patient presents today with severe obesity and obesity related risks/comorbidities as detailed above. The patient has made multiple unsuccessful attempts at weight loss as detailed above. The patient desires surgical weight loss for  a better chance of lifelong weight control and control of co-morbidities. They have attended the bariatric seminar and meet the qualifications for surgery based on the NIH criteria. We have discussed the various surgical options including the sleeve gastrectomy, gastric bypass, duodenal switch/modified duodenal switch. We also discussed non operative alternatives. The patient is currently interested in the UNDECIDED.    They will need to a/an UGI to evaluate their anatomy pre operatively. H pylori antigen/breath test ordered as well.    We have discussed the possible complications of bariatric surgery which include but are not limited to failed weight loss, weight regain, malnutrition, leak, bleed, stricture, gastric ulcer, gastric  fistula, gastric bleed, gallstones, new or worsening gastric reflux, nausea, emesis, internal hernia, abdominal wall hernia, gastric perforation, need for revision / conversion / or reversal, pregnancy complications and loss, intestinal ischemia, post operative skin complications, possible thinning of their hair, bowel obstruction, dumping syndrome, wound infection, blood clots (DVT, mesenteric thrombus, pulmonary embolism), increased addictive tendency, risk of anesthesia, and death.     The patient understands this is a life altering decision and will require compliance to the program for the remainder of their life in order to be monitored to avoid complication and ensure successful, sustained weight control. They will be placed on a lifelong low carbohydrate and low sugar diet, exercise, and vitamin regimen and will require frequent blood draws and office visits to ensure adequate nutrition and program compliance. Visits and follow up will be in compliance with the guidelines set forth by [place]. I have specifically mentioned the need to avoid all personal and second-hand tobacco exposure, systemic steroids, and NSAIDS after any bariatric surgery to help avoid the above listed complications. The patient has expressed understanding of the above and would like to enroll in the program. The patient will be submitted for medical and psychological clearance along with establishing with out dietician and joining the pre / post operative support group. They will be screened for depression and sleep apnea and treated pre operatively if needed. After successful completion of the preoperative regimen the patient will be submitted for insurance approval and pending this will be scheduled for surgery.    Tests/clearances ordered at this visit: Nicotine test. Pt verbalizes understanding that she will need a negative nicotine test to start the program. Scheduled follow up in 3 months.     EGD 06/27/2021: [facility], scanned to  media  Colonoscopy 06/20/2021: [facility], scanned to media  CBC, CMP, A1c, H pylori, Vitamin D, CXR, EKG, UGI  Nutrition, support group, PCP clearance, psychological clearance, cardiology clearance    All questions from the patient have been answered and they have demonstrated appropriate understanding of the process.      Signed By: Warden Fillers C. [facility], FNP-BC  Bariatric Nurse Practitioner  [facility]    November 25, 2021

## 2021-11-25 NOTE — Progress Notes (Signed)
Christy [name] is a 46 y.o. female (DOB: Jun 21, 1976) presenting to address:    Chief Complaint   Patient presents with    New Patient     Bariatric consult        Medication list and allergies have been reviewed with Fredonia Highland and updated as of today's date.     I have gone over all Medical, Surgical and Social History with Fredonia Highland and updated/added the information accordingly.

## 2021-11-29 NOTE — Telephone Encounter (Signed)
PA of ozempic was denied. Preferred drugs victoza, trulicity, bydureon and byetta

## 2021-11-30 NOTE — Telephone Encounter (Signed)
Received fax from the pharmacy requesting for an alternative for Ozempic. PA has been denied for the pt. Please advise, thank you.

## 2021-12-02 ENCOUNTER — Encounter

## 2021-12-02 NOTE — Telephone Encounter (Signed)
Ozempic is not covered  Per PA she can try victoza, trulicity,byetta     Last filled:07/13/21  Last seen:10/19/21  Future Appointments   Date Time Provider Department Center   12/09/2021 11:00 AM [facility] RM 1 [id number]     Dispense Quantity: 90     Refills: 1

## 2021-12-04 MED ORDER — DULAGLUTIDE 0.75 MG/0.5 ML SUBCUTANEOUS PEN INJECTOR
0.75 mg/0.5 mL | PEN_INJECTOR | SUBCUTANEOUS | 0 refills | Status: AC
Start: 2021-12-04 — End: ?

## 2021-12-04 MED ORDER — PAROXETINE 40 MG TAB
40 mg | ORAL_TABLET | Freq: Every evening | ORAL | 1 refills | Status: AC
Start: 2021-12-04 — End: ?

## 2021-12-04 MED ORDER — DULAGLUTIDE 1.5 MG/0.5 ML SUBCUTANEOUS PEN INJECTOR
1.5 mg/0.5 mL | SUBCUTANEOUS | 1 refills | Status: AC
Start: 2021-12-04 — End: ?

## 2021-12-09 ENCOUNTER — Inpatient Hospital Stay

## 2021-12-09 ENCOUNTER — Inpatient Hospital Stay
Admit: 2021-12-10 | Discharge: 2021-12-10 | Discharge status: Against Medical Advice | Payer: PRIVATE HEALTH INSURANCE | Admitting: Student in an Organized Health Care Education/Training Program

## 2021-12-09 ENCOUNTER — Inpatient Hospital Stay
Admit: 2021-12-09 | Discharge: 2021-12-10 | Disposition: A | Discharge status: Other Acute Inpatient Hospital | Payer: PRIVATE HEALTH INSURANCE | Attending: Student in an Organized Health Care Education/Training Program | Admitting: Student in an Organized Health Care Education/Training Program

## 2021-12-09 ENCOUNTER — Emergency Department: Admit: 2021-12-09 | Payer: PRIVATE HEALTH INSURANCE | Primary: Family Medicine

## 2021-12-09 ENCOUNTER — Inpatient Hospital Stay: Admit: 2021-12-09 | Payer: PRIVATE HEALTH INSURANCE | Attending: Family Medicine | Primary: Family Medicine

## 2021-12-09 DIAGNOSIS — R5383 Other fatigue: Secondary | ICD-10-CM

## 2021-12-09 DIAGNOSIS — E119 Type 2 diabetes mellitus without complications: Secondary | ICD-10-CM

## 2021-12-09 DIAGNOSIS — R079 Chest pain, unspecified: Secondary | ICD-10-CM

## 2021-12-09 DIAGNOSIS — I160 Hypertensive urgency: Principal | ICD-10-CM

## 2021-12-09 LAB — ECHO ADULT COMPLETE
AV Area by Peak Velocity: 1.8 cm2
AV Area by VTI: 2.1 cm2
AV Mean Gradient: 3 mmHg
AV Mean Velocity: 0.8 m/s
AV Peak Gradient: 6 mmHg
AV Peak Velocity: 1.2 m/s
AV VTI: 21.2 cm
AV Velocity Ratio: 0.58
AVA/BSA Peak Velocity: 0.9 cm2/m2
AVA/BSA VTI: 1.1 cm2/m2
Ao Root Index: 1.57 cm/m2
Aortic Root: 3.1 cm
Ascending Aorta Index: 1.82 cm/m2
Ascending Aorta: 3.6 cm
Est. RA Pressure: 3 mmHg
Fractional Shortening 2D: 29 % (ref 28–44)
IVSd: 1.2 cm — AB (ref 0.6–0.9)
LV Mass 2D Index: 68 g/m2 (ref 43–95)
LV Mass 2D: 134.7 g (ref 67–162)
LV RWT Ratio: 0.53
LVIDd Index: 1.92 cm/m2
LVIDd: 3.8 cm — AB (ref 3.9–5.3)
LVIDs Index: 1.36 cm/m2
LVIDs: 2.7 cm
LVOT Area: 2.8 cm2
LVOT Diameter: 1.9 cm
LVOT Mean Gradient: 1 mmHg
LVOT Peak Gradient: 2 mmHg
LVOT Peak Velocity: 0.7 m/s
LVOT SV: 42.5 ml
LVOT Stroke Volume Index: 21.5 mL/m2
LVOT VTI: 15 cm
LVOT:AV VTI Index: 0.71
LVPWd: 1 cm — AB (ref 0.6–0.9)
PV Max Velocity: 0.7 m/s
PV Peak Gradient: 2 mmHg
RA Area 4C: 10.2 cm2
RV Basal Dimension: 3.1 cm
RV Free Wall Peak S': 9 cm/s
RV Longitudinal Dimension: 7.1 cm
RV Mid Dimension: 2.7 cm
TAPSE: 1.6 cm — AB (ref 1.7–?)

## 2021-12-09 LAB — EKG, 12 LEAD, INITIAL
Atrial Rate: 90 {beats}/min
Calculated P Axis: 50 degrees
Calculated R Axis: 33 degrees
Calculated T Axis: 58 degrees
Diagnosis: NORMAL
P-R Interval: 176 ms
Q-T Interval: 366 ms
QRS Duration: 88 ms
QTC Calculation (Bezet): 447 ms
Ventricular Rate: 90 {beats}/min

## 2021-12-09 LAB — CBC WITH AUTOMATED DIFF
ABS. BASOPHILS: 0 10*3/uL (ref 0.0–0.1)
ABS. EOSINOPHILS: 0.2 10*3/uL (ref 0.0–0.4)
ABS. IMM. GRANS.: 0 10*3/uL (ref 0.00–0.04)
ABS. LYMPHOCYTES: 2.8 10*3/uL (ref 0.9–3.6)
ABS. MONOCYTES: 0.4 10*3/uL (ref 0.05–1.2)
ABS. NEUTROPHILS: 5.3 10*3/uL (ref 1.8–8.0)
ABSOLUTE NRBC: 0 10*3/uL (ref 0.00–0.01)
BASOPHILS: 0 % (ref 0–2)
EOSINOPHILS: 2 % (ref 0–5)
HCT: 37.4 % (ref 35.0–45.0)
HGB: 12.4 g/dL (ref 12.0–16.0)
IMMATURE GRANULOCYTES: 0 % (ref 0.0–0.5)
LYMPHOCYTES: 32 % (ref 21–52)
MCH: 28.1 PG (ref 24.0–34.0)
MCHC: 33.2 g/dL (ref 31.0–37.0)
MCV: 84.8 FL (ref 78.0–100.0)
MONOCYTES: 5 % (ref 3–10)
MPV: 11.3 FL (ref 9.2–11.8)
NEUTROPHILS: 61 % (ref 40–73)
NRBC: 0 PER 100 WBC
PLATELET COMMENTS: ADEQUATE
PLATELET: 265 10*3/uL (ref 135–420)
RBC: 4.41 M/uL (ref 4.20–5.30)
RDW: 12.9 % (ref 11.6–14.5)
WBC: 8.7 10*3/uL (ref 4.6–13.2)

## 2021-12-09 LAB — NT-PRO BNP: NT pro-BNP: 52 PG/ML (ref 0–450)

## 2021-12-09 LAB — METABOLIC PANEL, BASIC
Anion gap: 10 mmol/L (ref 3.0–18)
BUN/Creatinine ratio: 10 — ABNORMAL LOW (ref 12–20)
BUN: 10 MG/DL (ref 7.0–18)
CO2: 25 mmol/L (ref 21–32)
Calcium: 9.4 MG/DL (ref 8.5–10.1)
Chloride: 105 mmol/L (ref 100–111)
Creatinine: 0.96 MG/DL (ref 0.6–1.3)
Glucose: 157 mg/dL — ABNORMAL HIGH (ref 74–99)
Potassium: 4.1 mmol/L (ref 3.5–5.5)
Sodium: 140 mmol/L (ref 136–145)
eGFR: >60 mL/min/{1.73_m2} (ref >60)

## 2021-12-09 LAB — GLUCOSE, POC: Glucose (POC): 153 mg/dL — ABNORMAL HIGH (ref 70–110)

## 2021-12-09 LAB — TROPONIN-HIGH SENSITIVITY
Troponin-High Sensitivity: 10 ng/L (ref 0–54)
Troponin-High Sensitivity: 9 ng/L (ref 0–54)

## 2021-12-09 LAB — ECHO (TTE) COMPLETE (PRN CONTRAST/BUBBLE/STRAIN/3D)
AV Area by Peak Velocity: 1.8 cm2
AV Area by VTI: 2.1 cm2
AV Mean Gradient: 3 mm[Hg]
AV Mean Velocity: 0.8 m/s
AV Peak Gradient: 6 mm[Hg]
AV Peak Velocity: 1.2 m/s
AV VTI: 21.2 cm
AV Velocity Ratio: 0.58
AVA/BSA Peak Velocity: 0.9 cm2/m2
AVA/BSA VTI: 1.1 cm2/m2
Ao Root Index: 1.57 cm/m2
Aortic Root: 3.1 cm
Ascending Aorta Index: 1.82 cm/m2
Ascending Aorta: 3.6 cm
Est. RA Pressure: 3 mm[Hg]
Fractional Shortening 2D: 29 % (ref 28–44)
IVSd: 1.2 cm — AB (ref 0.6–0.9)
LV Mass 2D Index: 68 g/m2 (ref 43–95)
LV Mass 2D: 134.7 g (ref 67–162)
LV RWT Ratio: 0.53
LVIDd Index: 1.92 cm/m2
LVIDd: 3.8 cm — AB (ref 3.9–5.3)
LVIDs Index: 1.36 cm/m2
LVIDs: 2.7 cm
LVOT Area: 2.8 cm2
LVOT Diameter: 1.9 cm
LVOT Mean Gradient: 1 mm[Hg]
LVOT Peak Gradient: 2 mm[Hg]
LVOT Peak Velocity: 0.7 m/s
LVOT SV: 42.5 mL
LVOT Stroke Volume Index: 21.5 mL/m2
LVOT VTI: 15 cm
LVOT:AV VTI Index: 0.71
LVPWd: 1 cm — AB (ref 0.6–0.9)
Left Ventricular Ejection Fraction: 48
PV Max Velocity: 0.7 m/s
PV Peak Gradient: 2 mm[Hg]
RA Area 4C: 10.2 cm2
RV Basal Dimension: 3.1 cm
RV Free Wall Peak S': 9 cm/s
RV Longitudinal Dimension: 7.1 cm
RV Mid Dimension: 2.7 cm
TAPSE: 1.6 cm — AB

## 2021-12-09 LAB — CBC WITH AUTO DIFFERENTIAL
Basophils %: 0 % (ref 0–2)
Basophils Absolute: 0 10*3/uL (ref 0.0–0.1)
Eosinophils %: 2 % (ref 0–5)
Eosinophils Absolute: 0.2 10*3/uL (ref 0.0–0.4)
Granulocyte Absolute Count: 0 10*3/uL (ref 0.00–0.04)
Hematocrit: 37.4 % (ref 35.0–45.0)
Hemoglobin: 12.4 g/dL (ref 12.0–16.0)
Immature Granulocytes: 0 % (ref 0.0–0.5)
Lymphocytes %: 32 % (ref 21–52)
Lymphocytes Absolute: 2.8 10*3/uL (ref 0.9–3.6)
MCH: 28.1 PG (ref 24.0–34.0)
MCHC: 33.2 g/dL (ref 31.0–37.0)
MCV: 84.8 FL (ref 78.0–100.0)
MPV: 11.3 FL (ref 9.2–11.8)
Monocytes %: 5 % (ref 3–10)
Monocytes Absolute: 0.4 10*3/uL (ref 0.05–1.2)
NRBC Absolute: 0 10*3/uL (ref 0.00–0.01)
Neutrophils %: 61 % (ref 40–73)
Neutrophils Absolute: 5.3 10*3/uL (ref 1.8–8.0)
Nucleated RBCs: 0 PER 100 WBC
Platelet Comment: ADEQUATE
Platelets: 265 10*3/uL (ref 135–420)
RBC: 4.41 M/uL (ref 4.20–5.30)
RDW: 12.9 % (ref 11.6–14.5)
WBC: 8.7 10*3/uL (ref 4.6–13.2)

## 2021-12-09 LAB — PROBNP, N-TERMINAL: BNP: 52 PG/ML (ref 0–450)

## 2021-12-09 LAB — BASIC METABOLIC PANEL
Anion Gap: 10 mmol/L (ref 3.0–18)
BUN: 10 MG/DL (ref 7.0–18)
Bun/Cre Ratio: 10 — ABNORMAL LOW (ref 12–20)
CO2: 25 mmol/L (ref 21–32)
Calcium: 9.4 MG/DL (ref 8.5–10.1)
Chloride: 105 mmol/L (ref 100–111)
Creatinine: 0.96 MG/DL (ref 0.6–1.3)
ESTIMATED GLOMERULAR FILTRATION RATE: >60 mL/min/{1.73_m2} (ref >60)
Glucose: 157 mg/dL — ABNORMAL HIGH (ref 74–99)
Potassium: 4.1 mmol/L (ref 3.5–5.5)
Sodium: 140 mmol/L (ref 136–145)

## 2021-12-09 LAB — POCT GLUCOSE: POC Glucose: 153 mg/dL — ABNORMAL HIGH (ref 70–110)

## 2021-12-09 LAB — TROPONIN, HIGH SENSITIVITY
Troponin, High Sensitivity: 10 ng/L (ref 0–54)
Troponin, High Sensitivity: 9 ng/L (ref 0–54)

## 2021-12-09 MED ORDER — HYDROMORPHONE 1 MG/ML INJECTION SOLUTION
1 mg/mL | Freq: Once | INTRAMUSCULAR | Status: AC
Start: 2021-12-09 — End: 2021-12-09
  Administered 2021-12-09: 20:00:00 via INTRAVENOUS

## 2021-12-09 MED ORDER — MORPHINE 4 MG/ML INTRAVENOUS SOLUTION
4 mg/mL | INTRAVENOUS | Status: AC
Start: 2021-12-09 — End: 2021-12-09
  Administered 2021-12-09: 18:00:00 via INTRAVENOUS

## 2021-12-09 MED ORDER — DILTIAZEM 100 MG IV POWDER FOR SOLUTION
100 mg | INTRAVENOUS | Status: DC
Start: 2021-12-09 — End: 2021-12-09

## 2021-12-09 MED ORDER — LIDOCAINE 2 % MUCOSAL SOLN
2 % | Freq: Once | Status: AC
Start: 2021-12-09 — End: 2021-12-09
  Administered 2021-12-09: 20:00:00 via ORAL

## 2021-12-09 MED ORDER — DIPHENHYDRAMINE HCL 50 MG/ML IJ SOLN
50 mg/mL | Freq: Once | INTRAMUSCULAR | Status: AC
Start: 2021-12-09 — End: 2021-12-09
  Administered 2021-12-09: 22:00:00 via INTRAVENOUS

## 2021-12-09 MED ORDER — NITROGLYCERIN 0.4 MG SUBLINGUAL TAB
0.4 mg | SUBLINGUAL | Status: AC
Start: 2021-12-09 — End: 2021-12-09
  Administered 2021-12-09: 18:00:00 via SUBLINGUAL

## 2021-12-09 MED ORDER — DIPHENHYDRAMINE HCL 50 MG/ML IJ SOLN
50 mg/mL | Freq: Once | INTRAMUSCULAR | Status: AC
Start: 2021-12-09 — End: 2021-12-09
  Administered 2021-12-09: 21:00:00 via INTRAVENOUS

## 2021-12-09 MED ORDER — ASPIRIN 325 MG TAB
325 mg | ORAL | Status: AC
Start: 2021-12-09 — End: 2021-12-09
  Administered 2021-12-09: 18:00:00 via ORAL

## 2021-12-09 MED FILL — DIPHENHYDRAMINE HCL 50 MG/ML IJ SOLN: 50 mg/mL | INTRAMUSCULAR | Qty: 1

## 2021-12-09 MED FILL — NITROGLYCERIN 0.4 MG SUBLINGUAL TAB: 0.4 mg | SUBLINGUAL | Qty: 1

## 2021-12-09 MED FILL — MORPHINE 4 MG/ML SYRINGE: 4 mg/mL | INTRAMUSCULAR | Qty: 1

## 2021-12-09 MED FILL — ASPIRIN 325 MG TAB: 325 mg | ORAL | Qty: 1

## 2021-12-09 MED FILL — HYDROMORPHONE (PF) 1 MG/ML IJ SOLN: 1 mg/mL | INTRAMUSCULAR | Qty: 1

## 2021-12-09 MED FILL — MAG-AL PLUS 200 MG-200 MG-20 MG/5 ML ORAL SUSPENSION: 200-200-20 mg/5 mL | ORAL | Qty: 30

## 2021-12-09 NOTE — ED Provider Notes (Signed)
ED Provider Notes by Retta Diones, DO at 12/09/21 1239                Author: Retta Diones, DO  Service: EMERGENCY  Author Type: Physician       Filed: 12/09/21 1742  Date of Service: 12/09/21 1239  Status: Signed          Editor: Retta Diones, DO (Physician)               EMERGENCY DEPARTMENT HISTORY AND PHYSICAL EXAM         Date: 12/09/2021   Patient Name: Christy [name]        History of Presenting Illness          Chief Complaint       Patient presents with        ?  Chest Pain (Angina)              History Provided By: Patient      46 year old female with history of GERD, asthma, CAD, depression, hypertension, hypercholesterolemia presenting to the emergency department  for evaluation of chest pain.  Patient was in the Cath Lab about to have an echo when she developed some pressure-like chest pain and was found to be hypertensive with a blood pressure systolics in the 644I.  She appeared somewhat diaphoretic at the time.   She had 1 sublingual nitro prior to arrival to the emergency department states that she did take her olmesartan and clonidine this morning         PCP: Fayne Norrie, DO        Current Outpatient Medications          Medication  Sig  Dispense  Refill           ?  PARoxetine (PAXIL) 40 mg tablet  Take 1 Tablet by mouth nightly. Indications: depression  90 Tablet  1     ?  dulaglutide (TRULICITY) 3.47 QQ/5.9 mL sub-q pen  Starter dose: Inject 0.75 mg subcutaneously once weekly; Will increase to 1.5 mg once weekly after 4 weeks .  Indications: type 2 diabetes mellitus  4 Pen  0     ?  [START ON 01/01/2022] dulaglutide (TRULICITY) 1.5 DG/3.8 mL sub-q pen  0.5 mL by SubCUTAneous route every seven (7) days. Indications: type 2 diabetes mellitus  4 Each  1           ?  levothyroxine (SYNTHROID) 200 mcg tablet  Take 1 Tablet by mouth Daily (before breakfast). Indications: a condition with low thyroid hormone levels  90 Tablet  1           ?  pregabalin (LYRICA) 50 mg capsule  Take  1 Capsule by mouth two (2) times daily as needed (nerve pain). Max Daily Amount: 200 mg. After 1 week, if no side effects and limited benefit, can  increase dose to 2 capsules by mouth twice daily.  180 Capsule  0     ?  pantoprazole (PROTONIX) 40 mg tablet  Take 40 mg by mouth two (2) times a day.         ?  tiZANidine (ZANAFLEX) 4 mg tablet  Take 1 tablet up to 3 times daily x 1 week. After 1 week, can continue up to 2 times daily as needed for muscle spasm. If groggy, limit use to nightly  only or try 1/2 tablet.  30 Tablet  1     ?  metFORMIN (GLUCOPHAGE) 1,000  mg tablet  Take 1 Tablet by mouth two (2) times daily (with meals) for 90 days. Indications: type 2 diabetes mellitus  180 Tablet  1     ?  cloNIDine HCL (CATAPRES) 0.3 mg tablet  Take 1 Tablet by mouth two (2) times a day.  180 Tablet  1     ?  linaCLOtide (LINZESS) 145 mcg cap capsule  Take 145 mcg by mouth.         ?  nitroglycerin (NITROSTAT) 0.4 mg SL tablet  1 Tablet by SubLINGual route every five (5) minutes as needed for Chest Pain. Up to 3 doses.  25 Tablet  3     ?  olmesartan (BENICAR) 40 mg tablet  TAKE 1 TABLET BY MOUTH EVERY DAY  30 Tablet  5     ?  dapagliflozin (FARXIGA) 10 mg tab tablet  Take 1 Tablet by mouth daily.  90 Tablet  1     ?  atorvastatin (Lipitor) 40 mg tablet  Take 1 Tablet by mouth nightly for 90 days. Indications: high cholesterol  90 Tablet  1           ?  traZODone (DESYREL) 50 mg tablet  TAKE 1 TABLET BY MOUTH AT BEDTIME AS NEEDED FOR SLEEP               ?  hydrOXYzine pamoate (VISTARIL) 50 mg capsule  TAKE 1 CAPSULE BY MOUTH 4 TIMES A DAY AS NEEDED FOR ANXIETY         ?  glucose blood VI test strips (True Metrix Glucose Test Strip) strip  Use to test blood sugar once daily  100 Strip  11     ?  lancets misc  Use to test blood sugar once daily  1 Each  11     ?  Blood-Glucose Meter (True Metrix Glucose Meter) misc  1 Kit by Does Not Apply route daily.  1 Each  0     ?  Insulin Needles, Disposable, 31 gauge x 5/16" ndle   Use twice daily for insulin and victoza injections.  100 Each  11     ?  SITagliptin (Januvia) 100 mg tablet  Take 1 Tablet by mouth daily. Indications: type 2 diabetes mellitus  90 Tablet  2     ?  Latuda 40 mg tab tablet  Take 1 Tablet by mouth nightly.         ?  methocarbamoL (ROBAXIN) 750 mg tablet  Take 1 Tablet by mouth four (4) times daily as needed for Muscle Spasm(s) or Pain.  50 Tablet  0     ?  buPROPion (WELLBUTRIN) 75 mg tablet  Take 1 Tablet by mouth two (2) times a day.  60 Tablet  5           ?  aspirin delayed-release 81 mg tablet  81 mg.                 Past History        Past Medical History:     Past Medical History:        Diagnosis  Date         ?  Acid indigestion       ?  Acid reflux       ?  Asthma       ?  CAD (coronary artery disease)       ?  Depression       ?  Diabetes ([place])       ?  Diabetic neuropathy ([place])       ?  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  Enlarged heart       ?  Hypercholesterolemia       ?  Hypertension       ?  IBS (irritable bowel syndrome)       ?  Interstitial cystitis       ?  Sciatica       ?  Sleep apnea       ?  Spinal stenosis           ?  Thyroid disease             Past Surgical History:     Past Surgical History:         Procedure  Laterality  Date          ?  HX CHOLECYSTECTOMY              2008          ?  HX ORTHOPAEDIC              LEFT KNEE          ?  HX ORTHOPAEDIC              LEFT ARM          ?  HX PARTIAL HYSTERECTOMY    2003          STILL HAVE OVARIES          ?  HX PELVIC LAPAROSCOPY              ?  HX THYROIDECTOMY               Family History:     Family History         Problem  Relation  Age of Onset          ?  Diabetes  Mother       ?  Heart Disease  Mother       ?  Diabetes  Father       ?  Cancer  Sister       ?  Heart Disease  Maternal Aunt            ?  Cancer  Paternal Aunt             Social History:     Social History          Tobacco Use         ?  Smoking status:  Every Day              Packs/day:  0.25          Types:  Cigarettes         ?  Smokeless tobacco:  Never       Substance Use Topics         ?  Alcohol use:  Yes     ?  Drug use:  Yes              Types:  Marijuana             Comment: ONCE EVERY 6 MONTHS           Allergies:     Allergies  Allergen  Reactions         ?  Vicodin [Hydrocodone-Acetaminophen]  Itching     ?  Lisinopril-Hydrochlorothiazide  Other (comments)             Dry cough          ?  Pcn [Penicillins]  Other (comments)                Review of Systems           Review of Systems    Constitutional:  Negative for activity change, chills, diaphoresis, fatigue and fever.    Respiratory:  Negative for cough, chest tightness, shortness of breath, wheezing and stridor.     Cardiovascular:  Positive for chest pain. Negative for palpitations.    Gastrointestinal:  Negative for abdominal distention, abdominal pain, constipation, diarrhea, nausea and vomiting.    Genitourinary:  Negative for difficulty urinating, dysuria and hematuria.    Musculoskeletal:  Negative for back pain, joint swelling and myalgias.    Skin:  Negative for rash and wound.    Neurological:  Negative for dizziness, weakness, light-headedness, numbness and headaches.    Psychiatric/Behavioral:  Negative for agitation. The patient is not nervous/anxious.             Physical Exam     Visit Vitals      BP  (!) 162/101     Pulse  (!) 106     Temp  98 ??F (36.7 ??C)     Resp  13     Ht  5' 1"  (1.549 m)     Wt  101.6 kg (224 lb)     SpO2  99%        BMI  42.32 kg/m??              Physical Exam   Constitutional :        General: She is not in acute distress.      Appearance: She is not toxic-appearing.    HENT:       Head: Normocephalic and atraumatic.       Mouth/Throat:       Mouth: Mucous membranes are moist.    Eyes:       Extraocular Movements: Extraocular movements intact.       Pupils: Pupils are equal, round, and reactive to light.     Cardiovascular:       Rate and Rhythm: Normal rate and regular rhythm.       Heart  sounds: Normal heart sounds. No murmur heard.     No friction rub. No gallop.    Pulmonary:       Effort: Pulmonary effort is normal.       Breath sounds: Normal breath sounds. No stridor. No wheezing.     Abdominal:       General: There is no distension.       Palpations: Abdomen is soft. There is no mass.       Tenderness: There is no abdominal tenderness. There is no guarding.       Hernia: No hernia is present.     Musculoskeletal:          General: No swelling, tenderness or deformity.       Cervical back: Normal range of motion and neck supple.    Skin:      General: Skin is warm and dry.       Capillary Refill: Capillary refill takes less than 2 seconds.  Findings: No rash.    Neurological:       General: No focal deficit present.       Mental Status: She is alert and oriented to person, place, and time.       Sensory: No sensory deficit.       Motor: No weakness.    Psychiatric:          Mood and Affect: Mood normal.               Diagnostic Study Results        Labs -     Recent Results (from the past 12 hour(s))     EKG, 12 LEAD, INITIAL          Collection Time: 12/09/21 12:03 PM         Result  Value  Ref Range            Ventricular Rate  90  BPM       Atrial Rate  90  BPM       P-R Interval  176  ms       QRS Duration  88  ms       Q-T Interval  366  ms       QTC Calculation (Bezet)  447  ms       Calculated P Axis  50  degrees       Calculated R Axis  33  degrees       Calculated T Axis  58  degrees       Diagnosis                 Normal sinus rhythm   Normal ECG   When compared with ECG of 19-Feb-2013 12:50,   No significant change was found          ECHO ADULT COMPLETE          Collection Time: 12/09/21 12:13 PM         Result  Value  Ref Range            IVSd  1.2 (A)  0.6 - 0.9 cm       LVIDd  3.8 (A)  3.9 - 5.3 cm       LVIDs  2.7  cm       LVOT Diameter  1.9  cm       LVPWd  1.0 (A)  0.6 - 0.9 cm       LVOT Peak Gradient  2  mmHg       LVOT Mean Gradient  1  mmHg       LVOT SV  42.5  ml        LVOT Peak Velocity  0.7  m/s       LVOT VTI  15.0  cm       RV Free Wall Peak S'  9  cm/s       AV Area by Peak Velocity  1.8  cm2       AV Area by VTI  2.1  cm2       AV Peak Gradient  6  mmHg       AV Mean Gradient  3  mmHg       AV Peak Velocity  1.2  m/s       AV Mean Velocity  0.8  m/s       AV VTI  21.2  cm  PV Peak Gradient  2  mmHg       PV Max Velocity  0.7  m/s       TAPSE  1.6 (A)  1.7 cm       Ascending Aorta  3.6  cm       Aortic Root  3.1  cm       Fractional Shortening 2D  29  28 - 44 %       LVIDd Index  1.92  cm/m2       LVIDs Index  1.36  cm/m2       LV RWT Ratio  0.53         LV Mass 2D  134.7  67 - 162 g       LV Mass 2D Index  68.0  43 - 95 g/m2       LVOT Stroke Volume Index  21.5  mL/m2       LVOT Area  2.8  cm2       Ao Root Index  1.57  cm/m2       Ascending Aorta Index  1.82  cm/m2       AV Velocity Ratio  0.58         LVOT:AV VTI Index  0.71         AVA/BSA VTI  1.1  cm2/m2       AVA/BSA Peak Velocity  0.9  cm2/m2       Est. RA Pressure  3  mmHg       RV Basal Dimension  3.1  cm       RV Mid Dimension  2.7  cm       RV Longitudinal Dimension  7.1  cm       RA Area 4C  10.2  cm2       GLUCOSE, POC          Collection Time: 12/09/21 12:17 PM         Result  Value  Ref Range            Glucose (POC)  153 (H)  70 - 110 mg/dL       CBC WITH AUTOMATED DIFF          Collection Time: 12/09/21 12:31 PM         Result  Value  Ref Range            WBC  8.7  4.6 - 13.2 K/uL       RBC  4.41  4.20 - 5.30 M/uL       HGB  12.4  12.0 - 16.0 g/dL       HCT  37.4  35.0 - 45.0 %       MCV  84.8  78.0 - 100.0 FL       MCH  28.1  24.0 - 34.0 PG       MCHC  33.2  31.0 - 37.0 g/dL       RDW  12.9  11.6 - 14.5 %       PLATELET  265  135 - 420 K/uL       MPV  11.3  9.2 - 11.8 FL       NRBC  0.0  0 PER 100 WBC       ABSOLUTE NRBC  0.00  0.00 - 0.01 K/uL       NEUTROPHILS  61  40 - 73 %  LYMPHOCYTES  32  21 - 52 %       MONOCYTES  5  3 - 10 %       EOSINOPHILS  2  0 - 5 %       BASOPHILS  0  0 - 2 %        IMMATURE GRANULOCYTES  0  0.0 - 0.5 %       ABS. NEUTROPHILS  5.3  1.8 - 8.0 K/UL       ABS. LYMPHOCYTES  2.8  0.9 - 3.6 K/UL       ABS. MONOCYTES  0.4  0.05 - 1.2 K/UL       ABS. EOSINOPHILS  0.2  0.0 - 0.4 K/UL       ABS. BASOPHILS  0.0  0.0 - 0.1 K/UL       ABS. IMM. GRANS.  0.0  0.00 - 0.04 K/UL       DF  MANUAL          PLATELET COMMENTS  ADEQUATE PLATELETS          RBC COMMENTS  NORMOCYTIC, NORMOCHROMIC          METABOLIC PANEL, BASIC          Collection Time: 12/09/21 12:31 PM         Result  Value  Ref Range            Sodium  140  136 - 145 mmol/L       Potassium  4.1  3.5 - 5.5 mmol/L       Chloride  105  100 - 111 mmol/L       CO2  25  21 - 32 mmol/L       Anion gap  10  3.0 - 18 mmol/L       Glucose  157 (H)  74 - 99 mg/dL       BUN  10  7.0 - 18 MG/DL       Creatinine  0.96  0.6 - 1.3 MG/DL       BUN/Creatinine ratio  10 (L)  12 - 20         eGFR  >60  >60 ml/min/1.36m       Calcium  9.4  8.5 - 10.1 MG/DL       TROPONIN-HIGH SENSITIVITY          Collection Time: 12/09/21 12:31 PM         Result  Value  Ref Range            Troponin-High Sensitivity  10  0 - 54 ng/L       NT-PRO BNP          Collection Time: 12/09/21 12:31 PM         Result  Value  Ref Range            NT pro-BNP  52  0 - 450 PG/ML       TROPONIN-HIGH SENSITIVITY          Collection Time: 12/09/21  2:25 PM         Result  Value  Ref Range            Troponin-High Sensitivity  9  0 - 54 ng/L           Radiologic Studies -      CT HEAD WO CONT       Final Result     No  intracranial hemorrhage or acute abnormalities by CT.            XR CHEST PORT       Final Result     No acute cardiopulmonary process.                          Medical Decision Making     I am the first provider for this patient.      I reviewed the vital signs, available nursing notes, past medical history, past surgical history, family history and social history.         Records Reviewed: None, Prior medical records, Previous Radiology studies, Previous Laboratory  studies, Previous EKGs, and Nursing notes (Time  of Review: 12:39 PM)      Vital Signs-Reviewed the patient's vital signs.      EKG: Normal sinus rhythm.  No STEMI.  EKG is interpreted by me      ED Course: Progress Notes, Reevaluation, and Consults:      Provider Notes (Medical Decision Making):          MDM   Number of Diagnoses or Management Options   Diagnosis management comments: DDX: Including but not limited to ACS, CHF exacerbation, COPD/asthma exacerbation, pneumonia, viral URI, PE, pneumothorax      Patient presenting with chest pain.  She is hypertensive blood pressure 163/120.  She appears in discomfort but no acute distress.  She has an overall benign physical exam.  Lungs clear to auscultation.  Heart sounds are regular.  Will obtain screening  labs and cardiac enzymes including troponin and BNP.  Will provide aspirin and sublingual nitro.      1453:   Patient's lab work grossly unremarkable.  Troponin is negative.  Patient received sublingual nitro x3x1 but is still complaining of headache.  Will send for CT of the head to rule out intracranial hemorrhage.  Will repeat troponin.  I did discuss the  case with cardiology who recommends following up the repeat troponin and attempt GI cocktail.  Will call back if patient's pain is not improved.      1648:   Patient's repeat troponin is negative.  CT of the head showing no acute intracranial abnormalities.  X-ray grossly unremarkable.  Patient's pain has improved currently.  Discussed with cardiology.  Plan is to discharge home at this time and have her follow-up  with cardiology on an outpatient basis.  Return precautions advised.  Patient verbalizes understanding and agreement with plan.  Stable for discharge                         Procedures                 Diagnosis        Clinical Impression:       1.  Chest pain, unspecified type            Disposition: discharged        Follow-up Information                  Follow up With  Specialties  Details   Why  Contact Info              [facility] EMERGENCY DEPT  Emergency Medicine    As needed, If symptoms worsen  [address] [place]       Fayne Norrie, DO  Family Medicine  Call in 1 day    [place] [address]   [phone]                 Ailene Rud, MD  Cardiovascular Disease Physician, Internal Medicine Physician  Call     [address]   [place] [address]   [phone]                           Patient's Medications       Start Taking          No medications on file       Continue Taking           ASPIRIN DELAYED-RELEASE 81 MG TABLET     81 mg.           ATORVASTATIN (LIPITOR) 40 MG TABLET     Take 1 Tablet by mouth nightly for 90 days. Indications: high cholesterol           BLOOD-GLUCOSE METER (TRUE METRIX GLUCOSE METER) MISC     1 Kit by Does Not Apply route daily.           BUPROPION (WELLBUTRIN) 75 MG TABLET     Take 1 Tablet by mouth two (2) times a day.           CLONIDINE HCL (CATAPRES) 0.3 MG TABLET     Take 1 Tablet by mouth two (2) times a day.           DAPAGLIFLOZIN (FARXIGA) 10 MG TAB TABLET     Take 1 Tablet by mouth daily.           DULAGLUTIDE (TRULICITY) 0.93 GH/8.2 ML SUB-Q PEN     Starter dose: Inject 0.75 mg subcutaneously once weekly; Will increase to 1.5 mg once weekly after 4 weeks .  Indications: type 2 diabetes mellitus           DULAGLUTIDE (TRULICITY) 1.5 XH/3.7 ML SUB-Q PEN     0.5 mL by SubCUTAneous route every seven (7) days. Indications: type 2 diabetes mellitus           GLUCOSE BLOOD VI TEST STRIPS (TRUE METRIX GLUCOSE TEST STRIP) STRIP     Use to test blood sugar once daily           HYDROXYZINE PAMOATE (VISTARIL) 50 MG CAPSULE     TAKE 1 CAPSULE BY MOUTH 4 TIMES A DAY AS NEEDED FOR ANXIETY           INSULIN NEEDLES, DISPOSABLE, 31 GAUGE X 5/16" NDLE     Use twice daily for insulin and victoza injections.           LANCETS MISC     Use to test blood sugar once daily           LATUDA 40  MG TAB TABLET     Take 1 Tablet by mouth nightly.           LEVOTHYROXINE (SYNTHROID) 200 MCG TABLET     Take 1 Tablet by mouth Daily (before breakfast). Indications: a condition with low thyroid hormone levels           LINACLOTIDE (LINZESS) 145 MCG CAP CAPSULE     Take 145 mcg by mouth.           METFORMIN (GLUCOPHAGE) 1,000 MG TABLET  Take 1 Tablet by mouth two (2) times daily (with meals) for 90 days. Indications: type 2 diabetes mellitus           METHOCARBAMOL (ROBAXIN) 750 MG TABLET     Take 1 Tablet by mouth four (4) times daily as needed for Muscle Spasm(s) or Pain.           NITROGLYCERIN (NITROSTAT) 0.4 MG SL TABLET     1 Tablet by SubLINGual route every five (5) minutes as needed for Chest Pain. Up to 3 doses.           OLMESARTAN (BENICAR) 40 MG TABLET     TAKE 1 TABLET BY MOUTH EVERY DAY           PANTOPRAZOLE (PROTONIX) 40 MG TABLET     Take 40 mg by mouth two (2) times a day.           PAROXETINE (PAXIL) 40 MG TABLET     Take 1 Tablet by mouth nightly. Indications: depression           PREGABALIN (LYRICA) 50 MG CAPSULE     Take 1 Capsule by mouth two (2) times daily as needed (nerve pain). Max Daily Amount: 200 mg. After 1 week, if no side effects and limited benefit, can increase  dose to 2 capsules by mouth twice daily.           SITAGLIPTIN (JANUVIA) 100 MG TABLET     Take 1 Tablet by mouth daily. Indications: type 2 diabetes mellitus           TIZANIDINE (ZANAFLEX) 4 MG TABLET     Take 1 tablet up to 3 times daily x 1 week. After 1 week, can continue up to 2 times daily as needed for muscle spasm. If groggy, limit use to nightly only  or try 1/2 tablet.           TRAZODONE (DESYREL) 50 MG TABLET     TAKE 1 TABLET BY MOUTH AT BEDTIME AS NEEDED FOR SLEEP       These Medications have changed          No medications on file       Stop Taking          No medications on file        Disclaimer: Sections of this note are dictated using utilizing voice recognition software.  Minor typographical errors  may be present. If questions arise, please do not hesitate to contact me or call our department.

## 2021-12-09 NOTE — ED Provider Notes (Signed)
ED Provider Notes by Jerre Simon, MD at 12/09/21 2301                Author: Jerre Simon, MD  Service: Emergency Medicine  Author Type: Physician       Filed: 12/09/21 2302  Date of Service: 12/09/21 2301  Status: Signed          Editor: Jerre Simon, MD (Physician)               Patient was discharged when I arrived to my shift.  She is upset because she still symptomatic with shortness of breath lightheadedness.  Her EF was decreased from last year.  Patient  is persistently hypertensive.  I gave her 0.3 of clonidine with not much improvement.  Discussed case with hospitalist will admit for hypertensive emergency.  Was given hydralazine IV.  Patient stated she took her medication for blood pressure today.   Differential diagnosis stroke hypertensive emergency congestive heart failure.

## 2021-12-09 NOTE — ED Notes (Signed)
 ED Notes by Kaylee Knee, RN at 12/09/21 1818                Author: Kaylee Knee, RN  Service: --  Author Type: Registered Nurse       Filed: 12/09/21 1820  Date of Service: 12/09/21 1818  Status: Signed          Editor: Kaylee Knee, RN (Registered Nurse)               Pt family member stated I will snatch that doctor up. This RN requested pt family member to not threaten the doctor. Pt became verbally abusive towards this RN. Telling this RN  To shut up. Pt called RN a stupid bitch and I need to learn to do my job.

## 2021-12-09 NOTE — ED Notes (Signed)
ED Notes  by Clint Lipps, RN at 12/09/21 1800                Author: Clint Lipps, RN  Service: NURSING  Author Type: Registered Nurse       Filed: 12/09/21 2043  Date of Service: 12/09/21 1800  Status: Signed          Editor: Clint Lipps, RN (Registered Nurse)               Patient sitting quietly on stretcher. Given water. Continue to monitor.

## 2021-12-09 NOTE — ED Notes (Signed)
ED Notes  by Clint Lipps, RN at 12/09/21 1700                Author: Clint Lipps, RN  Service: NURSING  Author Type: Registered Nurse       Filed: 12/09/21 2043  Date of Service: 12/09/21 1700  Status: Signed          Editor: Clint Lipps, RN (Registered Nurse)               Patient resting quietly on stretcher. No needs identified at this time. PPD bedside. Continue to monitor.

## 2021-12-09 NOTE — ED Notes (Signed)
ED Notes by Lillia Abed, RN at 12/09/21 1546                Author: Lillia Abed, RN  Service: --  Author Type: Registered Nurse       Filed: 12/09/21 1547  Date of Service: 12/09/21 1546  Status: Signed          Editor: Lillia Abed, RN (Registered Nurse)               Patient complaining of itching feeling, Dr Griffin Basil notified medicated per orders

## 2021-12-09 NOTE — ED Notes (Signed)
ED Triage Notes by Clint Lipps, RN at 12/09/21 1324                Author: Clint Lipps, RN  Service: NURSING  Author Type: Registered Nurse       Filed: 12/09/21 1449  Date of Service: 12/09/21 1324  Status: Signed          Editor: Clint Lipps, RN (Registered Nurse)               Patient to ED from echo lab c/o intermittent left anterior chest pain and shortness of breath at rest that started after her echocardiogram was complete. She took one nitro PTA to  ED with minimal relief. Also c/o of HA.

## 2021-12-09 NOTE — Progress Notes (Signed)
Progress  Notes by Milinda Hirschfeld at 12/09/21 1605                Author: Milinda Hirschfeld  Service: Spiritual Care  Author Type: Pastoral Care       Filed: 12/09/21 1606  Date of Service: 12/09/21 1605  Status: Signed          Editor: Milinda Hirschfeld [facility])               Chaplain responded to Crisis Call for  Christy [name], who is a 45 y.o.,female,       The Chaplain provided the following Interventions:   Provided crisis spiritual care and support.    Offered prayers on behalf for the patient.    Chart reviewed.      Assessment:   There are no further spiritual or religious issues which require intervention at this time.       Plan:   Chaplains will continue to follow and will provide spiritual care as needed.   Chaplain recommends bedside caregivers page chaplain on duty if patient or family shows signs of acute spiritual or emotional distress.        Christy [name]  Chaplain   Spiritual Care    [phone]

## 2021-12-09 NOTE — H&P (Signed)
H&P by Lelon Frohlich, DO at 12/09/21 2246                Author: Lelon Frohlich, DO  Service: Internal Medicine  Author Type: Physician       Filed: 12/09/21 2353  Date of Service: 12/09/21 2246  Status: Signed          Editor: Lelon Frohlich, DO (Physician)                     History & Physical          Patient: Christy [name]  MRN: [id number]   CSN: [id number]          Date of Birth: 1976-07-22   Age: 46 y.o.   Sex: female         DOA: 12/09/2021      PCP: Fayne Norrie, DO           HPI:     CC: Chest pain, dizziness      Christy [name] is a 46 y.o. female with past medical history significant for hypertension, obesity, diabetes on insulin who presented to the ED after being directed from the echocardiographic lab for  hypertensive urgency as manifested by her chest pain and dizziness.  CT head was unremarkable.  Patient reported intermittent dizziness chest pain and shortness of breath as well as headache and migraine for a long time.   Per ED report cardiology evaluated her echocardiogram which showed a decreased EF functional from 55 to 60% to now 45 to 50%.  Patient was initially going to be discharged but due to her elevated blood pressure and symptoms I was asked to evaluate  her            Review of Systems   GENERAL: No fever, No chill, No malaise    HEENT: No change in vision, no ear ache, tinnitus, no sore throat or sinus congestion.    NECK: No pain or stiffness.    PULMONARY: No shortness of breath, no cough or wheeze.    Cardiovascular: Chest pain   GASTROINTESTINAL: No abd pain, No nausea/vomiting, No diarrhea, No melena or bright red blood per rectum.    GENITOURINARY: No urinary frequency, No urgency or pain with urination.    MUSCULOSKELETAL: No joint or muscle pain, no back pain, no recent trauma.    DERMATOLOGIC: No rash, no itching, no lesions.    HEMATOLOGICAL: No easy bruising or bleeding.    NEUROLOGIC: Headache and migraine, dizziness        Past Medical History:         Diagnosis  Date         ?  Acid indigestion       ?  Acid reflux       ?  Asthma       ?  CAD (coronary artery disease)       ?  Depression       ?  Diabetes ([place])       ?  Diabetic neuropathy ([place])       ?  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  Enlarged heart       ?  Hypercholesterolemia       ?  Hypertension       ?  IBS (irritable bowel syndrome)       ?  Interstitial cystitis       ?  Sciatica       ?  Sleep apnea       ?  Spinal stenosis           ?  Thyroid disease               Past Surgical History:         Procedure  Laterality  Date          ?  HX CHOLECYSTECTOMY              2008          ?  HX ORTHOPAEDIC              LEFT KNEE          ?  HX ORTHOPAEDIC              LEFT ARM          ?  HX PARTIAL HYSTERECTOMY    2003          STILL HAVE OVARIES          ?  HX PELVIC LAPAROSCOPY              ?  HX THYROIDECTOMY                 Family History         Problem  Relation  Age of Onset          ?  Diabetes  Mother       ?  Heart Disease  Mother       ?  Diabetes  Father       ?  Cancer  Sister       ?  Heart Disease  Maternal Aunt            ?  Cancer  Paternal Aunt               Social History          Socioeconomic History         ?  Marital status:  DIVORCED       Tobacco Use         ?  Smoking status:  Every Day              Packs/day:  0.25         Types:  Cigarettes         ?  Smokeless tobacco:  Never       Substance and Sexual Activity         ?  Alcohol use:  Yes     ?  Drug use:  Yes              Types:  Marijuana             Comment: ONCE EVERY 6 MONTHS         ?  Sexual activity:  Not Currently              Partners:  Male             Prior to Admission medications             Medication  Sig  Start Date  End Date  Taking?  Authorizing Provider            PARoxetine (PAXIL) 40 mg tablet  Take 1 Tablet by mouth nightly. Indications: depression  12/04/21      Negron, Kirstin H, DO            dulaglutide (TRULICITY) 2.95 JO/8.4 mL sub-q pen  Starter dose: Inject 0.75 mg  subcutaneously once weekly; Will increase to 1.5 mg once weekly after 4 weeks .  Indications: type 2 diabetes mellitus  12/04/21      Negron, Kirstin H, DO     dulaglutide (TRULICITY) 1.5 ZY/6.0 mL sub-q pen  0.5 mL by SubCUTAneous route every seven (7) days. Indications: type 2 diabetes mellitus  01/01/22      Negron, Kirstin H, DO     levothyroxine (SYNTHROID) 200 mcg tablet  Take 1 Tablet by mouth Daily (before breakfast). Indications: a condition with low thyroid hormone levels  10/19/21      Negron, Kirstin H, DO     pregabalin (LYRICA) 50 mg capsule  Take 1 Capsule by mouth two (2) times daily as needed (nerve pain). Max Daily Amount: 200 mg. After 1 week, if no side effects and limited benefit,  can increase dose to 2 capsules by mouth twice daily.  10/19/21      Negron, Kirstin H, DO     pantoprazole (PROTONIX) 40 mg tablet  Take 40 mg by mouth two (2) times a day.  09/05/21      Provider, Historical     tiZANidine (ZANAFLEX) 4 mg tablet  Take 1 tablet up to 3 times daily x 1 week. After 1 week, can continue up to 2 times daily as needed for muscle spasm. If groggy, limit use to nightly  only or try 1/2 tablet.  09/15/21      Negron, Kirstin H, DO     metFORMIN (GLUCOPHAGE) 1,000 mg tablet  Take 1 Tablet by mouth two (2) times daily (with meals) for 90 days. Indications: type 2 diabetes mellitus  09/06/21  12/05/21    Negron, Kirstin H, DO     cloNIDine HCL (CATAPRES) 0.3 mg tablet  Take 1 Tablet by mouth two (2) times a day.  08/23/21      Negron, Kirstin H, DO     linaCLOtide (LINZESS) 145 mcg cap capsule  Take 145 mcg by mouth.  06/20/21      Provider, Historical     nitroglycerin (NITROSTAT) 0.4 mg SL tablet  1 Tablet by SubLINGual route every five (5) minutes as needed for Chest Pain. Up to 3 doses.  06/09/21      Ailene Rud, MD     olmesartan (BENICAR) 40 mg tablet  TAKE 1 TABLET BY MOUTH EVERY DAY  05/31/21      Ailene Rud, MD     dapagliflozin (FARXIGA) 10 mg tab tablet  Take 1 Tablet by mouth  daily.  03/24/21      Negron, Kirstin H, DO     atorvastatin (Lipitor) 40 mg tablet  Take 1 Tablet by mouth nightly for 90 days. Indications: high cholesterol  03/24/21  10/19/21    Negron, Kirstin H, DO     traZODone (DESYREL) 50 mg tablet  TAKE 1 TABLET BY MOUTH AT BEDTIME AS NEEDED FOR SLEEP  02/21/21      Provider, Historical            hydrOXYzine pamoate (VISTARIL) 50 mg capsule  TAKE 1 CAPSULE BY MOUTH 4 TIMES A DAY AS NEEDED FOR ANXIETY  01/05/21      Provider, Historical            glucose blood VI test strips (True  Metrix Glucose Test Strip) strip  Use to test blood sugar once daily  03/16/21      Negron, Kirstin H, DO     lancets misc  Use to test blood sugar once daily  03/16/21      Negron, Kirstin H, DO     Blood-Glucose Meter (True Metrix Glucose Meter) misc  1 Kit by Does Not Apply route daily.  03/16/21      Negron, Kirstin H, DO     Insulin Needles, Disposable, 31 gauge x 5/16" ndle  Use twice daily for insulin and victoza injections.  03/16/21      Negron, Kirstin H, DO     SITagliptin (Januvia) 100 mg tablet  Take 1 Tablet by mouth daily. Indications: type 2 diabetes mellitus  03/16/21      Negron, Kirstin H, DO     Latuda 40 mg tab tablet  Take 1 Tablet by mouth nightly.  03/01/21      Provider, Historical     methocarbamoL (ROBAXIN) 750 mg tablet  Take 1 Tablet by mouth four (4) times daily as needed for Muscle Spasm(s) or Pain.  11/30/20      Lovenia Kim, MD     buPROPion [facility]) 75 mg tablet  Take 1 Tablet by mouth two (2) times a day.  09/10/20      Ailene Rud, MD            aspirin delayed-release 81 mg tablet  81 mg.        Provider, Historical             Allergies        Allergen  Reactions         ?  Vicodin [Hydrocodone-Acetaminophen]  Itching     ?  Lisinopril-Hydrochlorothiazide  Other (comments)             Dry cough          ?  Pcn [Penicillins]  Other (comments)                      Physical Exam:         Visit Vitals      BP  (!) 160/119     Pulse  (!) 107     Temp  98.1 ??F  (36.7 ??C)     Resp  14     Ht  5' 1"  (1.549 m)     Wt  101.6 kg (224 lb)     SpO2  99%        BMI  42.32 kg/m??           Physical Exam:   Tele:       General:   Alert, cooperative, no acute distress        HEENT:  Normocephalic, atraumatic. PERRL. Conjunctivae/corneas clear. No drainage or sinus tenderness. Supple neck, trachea midline, no adenopathy.      Lungs:    Clear to auscultation bilaterally        Heart:   Tachycardia, regular  rhythm, S1, S2 normal, no murmur. Palpable distal pulses        Abdomen:  Soft, non-tender, not distended. Bowel sounds normal.      Extremities:  No edema. Normal cap refill     Pulses:  2+ and symmetric all extremities.     Skin:  Warm, dry. No rashes or active lesions/ulcerations        Neurologic:  AAOx3, No focal  motor or sensory deficit     Psych:           Normal affect, insight and judgement   Additional:       Lab/Data Review:      Labs:  Results:                  Chemistry  Recent Labs         12/09/21   1231      GLU  157*      NA  140      K  4.1      CL  105      CO2  25      BUN  10      CREA  0.96      CA  9.4      AGAP  10      BUCR  10*              CBC w/Diff  Recent Labs         12/09/21   1231      WBC  8.7      RBC  4.41      HGB  12.4      HCT  37.4      PLT  265      GRANS  61      LYMPH  32      EOS  2              Coagulation  No results for input(s): PTP, INR, APTT, INREXT in the last 72 hours.      Iron/Ferritin  No results for input(s): IRON in the last 72 hours.      No lab exists for component: TIBCCALC     BNP  No results for input(s): BNPP in the last 72 hours.     Cardiac Enzymes  No results for input(s): CPK, CKND1, MYO in the last 72 hours.      No lab exists for component: CKRMB, TROIP     Liver Enzymes  No results for input(s): TP, ALB, TBIL, AP in the last 72 hours.      No lab exists for component: SGOT, GPT, DBIL        Thyroid Studies  Lab Results      Component  Value  Date/Time        TSH  3.25  06/08/2021 10:54 AM                       Imaging Reviewed:     CT Results  (Last 48 hours)                                       12/09/21 1517    CT HEAD WO CONT  Final result            Impression:    No intracranial hemorrhage or acute abnormalities by CT.                       Narrative:    CT head without IV contrast.             HISTORY: Hypertension. Headaches.             All CT scans at this facility are performed using dose optimization technique as  appropriate to a performed exam, to include automated exposure control,      adjustment of the mA and/or kV according to patient size (including appropriate      matching for site specific examination) or use of iterative reconstruction      technique.             Moderate degree of motion artifacts. Cortical sulci and ventricles within normal      range. No midline shift or extra-axial collection. No intracranial hemorrhage,      mass lesions or cortical infarct involving a major vessel. Visualized paranasal      sinuses and mastoid air cells are clear.                                     XR Results (most recent):   Results from Hospital Encounter encounter on 12/09/21      XR CHEST PORT      Narrative   EXAM:  XR CHEST PORT      INDICATION: chest pain      COMPARISON: Chest radiograph November 30, 2020.      FINDINGS: No focal consolidation. No pneumothorax or pleural effusion. Stable   cardiomediastinal contours within normal limits. No acute osseous findings.      Impression   No acute cardiopulmonary process.               Assessment:     Active Problems:     Hypertension (06/06/2011)        DM (diabetes mellitus) ([place]) (06/06/2011)        Chest pain (12/10/2012)        Dizziness (12/10/2012)        Hypertensive emergency (12/09/2021)                 Plan:     Hypertensive urgency -patient reported that her blood pressure was greater than 200 during the echocardiogram.  She has taken her daily Benecol as well as clonidine with no improvement.   Heart rate was also notable to be tachycardic   -Trial  of labetalol IV.  If no improvement may need to start IV drip -?  Cardene   Patient was previously on oral labetalol but stated that her cardiologist took her off of it   -Optimize blood pressure control   -Follow-up with cardiology      Headache/migraine/dizziness -no acute finding on CTA.  Do I am concerned for a posterior stroke such as press.  If headache and dizziness persist in the morning consider obtaining brain MRI         Risk of deterioration:  [x] Low    [] Moderate  []  High       Prophylaxis:  [x] Lovenox  [] Coumadin  []  Hep SQ  [] SCDs  [] H2B/PPI       Disposition:  [x] Home w/ Family   [] HH PT,OT,RN   []  SNF/LTC   [] SAH/Rehab       Discussed Code Status:         [x] Full Code       [] DNR             ___________________________________________________       Reason for admission, current diagnosis (or pending work-up), treatment plan including consultations reviewed with:      [x] Patient   [x] Family    []  ED Care Manager  [x] ED Doc   [] Specialist :   Total  Time Coordinating Admission:    50  minutes     [] Total Critical Care Time:         Lelon Frohlich, DO   12/09/2021, 10:46 PM

## 2021-12-10 LAB — D DIMER: D DIMER: 0.27 ug/ml(FEU) (ref <0.46)

## 2021-12-10 LAB — D-DIMER, QUANTITATIVE: D-Dimer, Quant: 0.27 ug/ml(FEU) (ref <0.46)

## 2021-12-10 MED ORDER — LEVOTHYROXINE SODIUM 100 MCG PO TABS
100 MCG | Freq: Every day | ORAL | Status: DC
Start: 2021-12-10 — End: 2021-12-10

## 2021-12-10 MED ORDER — CLONIDINE HCL 0.1 MG PO TABS
0.1 MG | Freq: Two times a day (BID) | ORAL | Status: DC
Start: 2021-12-10 — End: 2021-12-10

## 2021-12-10 MED ORDER — GLUCOSE 4 G PO CHEW
4 g | ORAL | Status: DC | PRN
Start: 2021-12-10 — End: 2021-12-10

## 2021-12-10 MED ORDER — DEXTROSE 10 % IV BOLUS
INTRAVENOUS | Status: DC | PRN
Start: 2021-12-10 — End: 2021-12-10

## 2021-12-10 MED ORDER — ACETAMINOPHEN 500 MG PO TABS
500 | Freq: Three times a day (TID) | ORAL | Status: DC | PRN
Start: 2021-12-10 — End: 2021-12-10

## 2021-12-10 MED ORDER — LOSARTAN POTASSIUM 50 MG PO TABS
50 MG | Freq: Every day | ORAL | Status: DC
Start: 2021-12-10 — End: 2021-12-10

## 2021-12-10 MED ORDER — ENOXAPARIN SODIUM 40 MG/0.4ML IJ SOSY
40 MG/0.4ML | Freq: Every day | INTRAMUSCULAR | Status: DC
Start: 2021-12-10 — End: 2021-12-10

## 2021-12-10 MED ORDER — POLYETHYLENE GLYCOL 3350 17 G PO PACK
17 g | Freq: Every day | ORAL | Status: DC | PRN
Start: 2021-12-10 — End: 2021-12-10

## 2021-12-10 MED ORDER — INSULIN LISPRO 100 UNIT/ML IJ SOLN
100 UNIT/ML | Freq: Four times a day (QID) | INTRAMUSCULAR | Status: DC
Start: 2021-12-10 — End: 2021-12-10

## 2021-12-10 MED ORDER — PAROXETINE HCL 20 MG PO TABS
20 MG | Freq: Every evening | ORAL | Status: DC
Start: 2021-12-10 — End: 2021-12-10

## 2021-12-10 MED ORDER — PANTOPRAZOLE SODIUM 40 MG PO TBEC
40 MG | Freq: Two times a day (BID) | ORAL | Status: DC
Start: 2021-12-10 — End: 2021-12-10

## 2021-12-10 MED ORDER — LINACLOTIDE 145 MCG PO CAPS
145 MCG | Freq: Every day | ORAL | Status: DC
Start: 2021-12-10 — End: 2021-12-10

## 2021-12-10 MED ORDER — ASPIRIN 81 MG PO TBEC
81 MG | Freq: Every day | ORAL | Status: DC
Start: 2021-12-10 — End: 2021-12-10

## 2021-12-10 MED ORDER — GLUCAGON HCL RDNA (DIAGNOSTIC) 1 MG IJ SOLR
1 MG | INTRAMUSCULAR | Status: DC | PRN
Start: 2021-12-10 — End: 2021-12-10

## 2021-12-10 MED ORDER — BUPROPION HCL 75 MG PO TABS
75 MG | Freq: Two times a day (BID) | ORAL | Status: DC
Start: 2021-12-10 — End: 2021-12-10

## 2021-12-10 MED ORDER — NORMAL SALINE FLUSH 0.9 % IV SOLN
0.9 % | Freq: Three times a day (TID) | INTRAVENOUS | Status: DC
Start: 2021-12-10 — End: 2021-12-10

## 2021-12-10 MED ORDER — ONDANSETRON 4 MG PO TBDP
4 MG | Freq: Three times a day (TID) | ORAL | Status: DC | PRN
Start: 2021-12-10 — End: 2021-12-10

## 2021-12-10 MED ORDER — NORMAL SALINE FLUSH 0.9 % IV SOLN
0.9 % | INTRAVENOUS | Status: DC | PRN
Start: 2021-12-10 — End: 2021-12-10

## 2021-12-10 MED ORDER — PREGABALIN 50 MG PO CAPS
50 MG | Freq: Two times a day (BID) | ORAL | Status: DC | PRN
Start: 2021-12-10 — End: 2021-12-10

## 2021-12-10 MED ORDER — INSULIN GLARGINE 100 UNIT/ML SC SOLN
100 UNIT/ML | Freq: Every evening | SUBCUTANEOUS | Status: DC
Start: 2021-12-10 — End: 2021-12-10

## 2021-12-10 MED ORDER — LABETALOL HCL 5 MG/ML IV SOLN
5 MG/ML | Freq: Once | INTRAVENOUS | Status: AC
Start: 2021-12-10 — End: 2021-12-10
  Administered 2021-12-10: 07:00:00 10 mg via INTRAVENOUS

## 2021-12-10 MED ORDER — LURASIDONE HCL 40 MG PO TABS
40 MG | Freq: Every evening | ORAL | Status: DC
Start: 2021-12-10 — End: 2021-12-10

## 2021-12-10 MED ORDER — TRAZODONE HCL 50 MG PO TABS
50 MG | Freq: Every evening | ORAL | Status: DC | PRN
Start: 2021-12-10 — End: 2021-12-10

## 2021-12-10 MED ORDER — ONDANSETRON HCL 4 MG/2ML IJ SOLN
42 MG/2ML | Freq: Four times a day (QID) | INTRAMUSCULAR | Status: DC | PRN
Start: 2021-12-10 — End: 2021-12-10

## 2021-12-10 MED ORDER — EMPAGLIFLOZIN 10 MG PO TABS
10 MG | Freq: Every day | ORAL | Status: DC
Start: 2021-12-10 — End: 2021-12-10

## 2021-12-10 MED ORDER — ACETAMINOPHEN 325 MG TABLET
325 mg | Freq: Once | ORAL | Status: DC
Start: 2021-12-10 — End: 2021-12-10

## 2021-12-10 MED ORDER — SODIUM CHLORIDE 0.9 % IJ SYRG
Freq: Three times a day (TID) | INTRAMUSCULAR | Status: DC
Start: 2021-12-10 — End: 2021-12-10

## 2021-12-10 MED ORDER — GLUCOSE 4 GRAM CHEWABLE TAB
4 gram | ORAL | Status: DC | PRN
Start: 2021-12-10 — End: 2021-12-10

## 2021-12-10 MED ORDER — SODIUM CHLORIDE 0.9 % IJ SYRG
INTRAMUSCULAR | Status: DC | PRN
Start: 2021-12-10 — End: 2021-12-10

## 2021-12-10 MED ORDER — LURASIDONE 40 MG TAB
40 mg | Freq: Every evening | ORAL | Status: DC
Start: 2021-12-10 — End: 2021-12-10

## 2021-12-10 MED ORDER — INSULIN LISPRO 100 UNIT/ML INJECTION
100 unit/mL | Freq: Four times a day (QID) | SUBCUTANEOUS | Status: DC
Start: 2021-12-10 — End: 2021-12-10

## 2021-12-10 MED ORDER — CLONIDINE 0.1 MG TAB
0.1 mg | ORAL | Status: AC
Start: 2021-12-10 — End: 2021-12-09
  Administered 2021-12-10: 01:00:00 via ORAL

## 2021-12-10 MED ORDER — .PHARMACY TO SUBSTITUTE PER PROTOCOL
Status: DC | PRN
Start: 2021-12-10 — End: 2021-12-10

## 2021-12-10 MED ORDER — POLYETHYLENE GLYCOL 3350 17 GRAM (100 %) ORAL POWDER PACKET
17 gram | Freq: Every day | ORAL | Status: DC | PRN
Start: 2021-12-10 — End: 2021-12-10

## 2021-12-10 MED ORDER — BUPROPION 75 MG TAB
75 mg | Freq: Two times a day (BID) | ORAL | Status: DC
Start: 2021-12-10 — End: 2021-12-10

## 2021-12-10 MED ORDER — PREGABALIN 50 MG CAP
50 mg | Freq: Two times a day (BID) | ORAL | Status: DC | PRN
Start: 2021-12-10 — End: 2021-12-10

## 2021-12-10 MED ORDER — GLUCAGON 1 MG INJECTION
1 mg | INTRAMUSCULAR | Status: DC | PRN
Start: 2021-12-10 — End: 2021-12-10

## 2021-12-10 MED ORDER — ENOXAPARIN 40 MG/0.4 ML SUB-Q SYRINGE
40 mg/0.4 mL | Freq: Every day | SUBCUTANEOUS | Status: DC
Start: 2021-12-10 — End: 2021-12-10

## 2021-12-10 MED ORDER — PANTOPRAZOLE 40 MG TAB, DELAYED RELEASE
40 mg | Freq: Two times a day (BID) | ORAL | Status: DC
Start: 2021-12-10 — End: 2021-12-10

## 2021-12-10 MED ORDER — INSULIN GLARGINE 100 UNIT/ML INJECTION
100 unit/mL | Freq: Every evening | SUBCUTANEOUS | Status: DC
Start: 2021-12-10 — End: 2021-12-10

## 2021-12-10 MED ORDER — LEVOTHYROXINE 100 MCG TAB
100 mcg | Freq: Every day | ORAL | Status: DC
Start: 2021-12-10 — End: 2021-12-10

## 2021-12-10 MED ORDER — PAROXETINE 20 MG TAB
20 mg | Freq: Every evening | ORAL | Status: DC
Start: 2021-12-10 — End: 2021-12-10

## 2021-12-10 MED ORDER — EMPAGLIFLOZIN 10 MG TABLET
10 mg | Freq: Every day | ORAL | Status: DC
Start: 2021-12-10 — End: 2021-12-10

## 2021-12-10 MED ORDER — ONDANSETRON (PF) 4 MG/2 ML INJECTION
4 mg/2 mL | Freq: Four times a day (QID) | INTRAMUSCULAR | Status: DC | PRN
Start: 2021-12-10 — End: 2021-12-10

## 2021-12-10 MED ORDER — TRAZODONE 50 MG TAB
50 mg | Freq: Every evening | ORAL | Status: DC | PRN
Start: 2021-12-10 — End: 2021-12-10

## 2021-12-10 MED ORDER — DEXTROSE 10% IN WATER (D10W) IV
10 % | INTRAVENOUS | Status: DC | PRN
Start: 2021-12-10 — End: 2021-12-10

## 2021-12-10 MED ORDER — CLONIDINE 0.1 MG TAB
0.1 mg | Freq: Two times a day (BID) | ORAL | Status: DC
Start: 2021-12-10 — End: 2021-12-10

## 2021-12-10 MED ORDER — ONDANSETRON 4 MG TAB, RAPID DISSOLVE
4 mg | Freq: Three times a day (TID) | ORAL | Status: DC | PRN
Start: 2021-12-10 — End: 2021-12-10

## 2021-12-10 MED ORDER — ASPIRIN 81 MG TAB, DELAYED RELEASE
81 mg | Freq: Every day | ORAL | Status: DC
Start: 2021-12-10 — End: 2021-12-10

## 2021-12-10 MED FILL — CLONIDINE 0.1 MG TAB: 0.1 mg | ORAL | Qty: 3

## 2021-12-10 MED FILL — BD POSIFLUSH NORMAL SALINE 0.9 % INJECTION SYRINGE: INTRAMUSCULAR | Qty: 40

## 2021-12-10 MED FILL — LANTUS 100 UNIT/ML SC SOLN: 100 UNIT/ML | SUBCUTANEOUS | Qty: 10

## 2021-12-10 MED FILL — BD POSIFLUSH 0.9 % IV SOLN: 0.9 % | INTRAVENOUS | Qty: 40

## 2021-12-10 MED FILL — HUMALOG 100 UNIT/ML IJ SOLN: 100 UNIT/ML | INTRAMUSCULAR | Qty: 10

## 2021-12-10 MED FILL — LINZESS 145 MCG PO CAPS: 145 MCG | ORAL | Qty: 1

## 2021-12-10 MED FILL — JARDIANCE 10 MG PO TABS: 10 MG | ORAL | Qty: 1

## 2021-12-10 NOTE — Other (Signed)
Comments  Comment          Patient Demographics    Patient Name   Christy [name], Christy [name]   [id number] Legal Sex   Female DOB   October 03, 1976 Address   [place] [address] Phone   [phone] (Home)   [phone] (Mobile)     CSN:   [id number]     [place] Date: Admit Time Room Bed   Dec 09, 2021 12:39 PM H2 [14025] B [01027]     Attending Providers    Provider Pager From To   Retta Diones, DO  12/09/21 12/09/21   Volney American A, DO  12/09/21 12/10/21     Patient Contacts    Name Relation Home Work Mobile   Gerlene Fee Boyfriend [phone]       Utilization Reviews       Hypertension - Care Day 1 (12/09/2021) by Carmel Sacramento, RN       Review Entered Review Status   12/10/2021 1442 Completed      Criteria Review      Care Day: 1 Care Date: 12/09/2021 Level of Care: Telemetry    Guideline Day 1    Level Of Care    (X) ICU, intermediate care, or floor    12/10/2021 14:42:38 EST by Carmel Sacramento      telemetry    Clinical Status    ( ) * Clinical Indications met    (X) Possible chest pain, dyspnea, blurred vision, change in sensorium    12/10/2021 14:42:38 EST by Carmel Sacramento      chest pain, Headache, SOB    Routes    (X) IV medications    12/10/2021 14:42:38 EST by Carmel Sacramento      benadryl 48m IV X2, dilaudid 170mIV X1, morphine 65m6mV X1    Interventions    (X) Urinalysis, chemistries, ECG, CXR    12/10/2021 14:42:38 EST by BerCarmel Sacramento   glucose 157    (X) Possible brain or renal imaging    12/10/2021 14:42:38 EST by BerCarmel Sacramento   CT head: No intracranial hemorrhage or acute abnormalities by CT.    (X) Possible cardiac monitoring    Medications    ( ) Oral or parenteral antihypertensives    12/10/2021 14:42:38 EST by BerCarmel Sacramento   catapres 0.3mg33m X1,nitrostat 0.65mg 47mX1    * Milestone   Additional Notes   12/09/21       Christy [name] 45 y.39 female with hypertensive urgency as manifested by her chest pain and dizziness. Patient reported intermittent  dizziness chest pain and shortness of breath as well as headache and migraine for a long time.   Per ED report cardiology evaluated her echocardiogram which showed a decreased EF functional from 55 to 60% to now 45 to 50%.   Patient was initially going to be discharged but due to her elevated blood pressure and symptoms I was asked to evaluate her.      ED meds: asa 325mg 63m1, catapres 0.3mg PO70m, benadryl 25mg IV50m dilaudid 1mg IV X52mmorphine 65mg IV X19mI cocktail 40 mL PO X1, nitrostat 0.65mg SL X1 19m  Vitals: 98.0, 101, 16, 181/126, 100%   CXR: No acute cardiopulmonary process.      ECHO:   ??  Left Ventricle: Mildly reduced left ventricular systolic function with a visually  estimated EF of 45 - 50%. Left ventricle size is normal. LVIDd is 3.8 cm. Mildly increased wall thickness. Findings consistent with mild concentric hypertrophy. See diagram for wall motion findings.   ??  Right Ventricle: Mildly reduced systolic function. TAPSE is 1.6 cm. RV Peak S' is 9 cm/s.   ??  Tricuspid Valve: Unable to assess RVSP due to inadequate or insignificant tricuspid regurgitation.   ??  Aorta: Normal sized aortic root. Ao Root diameter is 3.1 cm. Mildly dilated ascending aorta. Ao Ascending diameter is 3.6 cm. Descending aorta is not well-visualized.         Physical Exam:   Tele:    General: Alert, cooperative, no acute distress   HEENT: Normocephalic, atraumatic. PERRL. Conjunctivae/corneas clear. No drainage or sinus tenderness. Supple neck, trachea midline, no adenopathy.    Lungs:   Clear to auscultation bilaterally   Heart:  Tachycardia, regular  rhythm, S1, S2 normal, no murmur. Palpable distal pulses   Abdomen: Soft, non-tender, not distended. Bowel sounds normal.    Extremities: No edema. Normal cap refill   Pulses: 2+ and symmetric all extremities.   Skin: Warm, dry. No rashes or active lesions/ulcerations   Neurologic: AAOx3, No focal motor or sensory deficit   Psych:                      Normal affect, insight  and judgement         IM:   Hypertensive urgency -patient reported that her blood pressure was greater than 200 during the echocardiogram.  She has taken her daily Benecol as well as clonidine with no improvement.  Heart rate was also notable to be tachycardic   -Trial of labetalol IV.  If no improvement may need to start IV drip -?  Cardene   Patient was previously on oral labetalol but stated that her cardiologist took her off of it   -Optimize blood pressure control   -Follow-up with cardiology       Headache/migraine/dizziness -no acute finding on CTA.  Do I am concerned for a posterior stroke such as press.  If headache and dizziness persist in the morning consider obtaining brain MRI               Hypertension - Clinical Indications for Admission to Inpatient Care by Carmel Sacramento, RN       Review Entered Review Status   12/10/2021 1441 Completed      Criteria Review      Clinical Indications for Admission to Inpatient Care    Most Recent Editor: Carmel Sacramento Most Recent Date: 12/10/2021 14:41:28 EST    ( ) Admission is indicated for  1 or more  of the following :       ( ) Hypertensive emergency indicated by SBP greater than 180 mm Hg or DBP greater than 120 mm       Hg [A] with evidence of acute or worsening target organ damage, as indicated by  1 or more        of the following  (1) (2) (3) (4):          ( ) Other acute or worsening end organ damage due to severe hypertension          12/10/2021 14:41:28 EST by Carmel Sacramento            BP 181/126     H&P Notes     H&P by Volney American A, DO at 12/09/21  2246 documented on ED from 12/09/2021 in [facility] EMERGENCY DEPT    Author: Lelon Frohlich, DO Author Type: Physician Filed: 12/09/21 2353   Note Status: Signed Cosign: Cosign Not Required Date of Service: 12/09/21 2246   Editor: Lelon Frohlich, DO (Physician)               Expand All Collapse All        History & Physical     Patient: Christy [name] MRN: [id number]  CSN: [id number]    Date of Birth:  [birth year]  Age: 46 y.o.  Sex: female       DOA: 12/09/2021     PCP: Fayne Norrie, DO       HPI:   CC: Chest pain, dizziness     Christy [name] is a 46 y.o. female with past medical history significant for hypertension, obesity, diabetes on insulin who presented to the ED after being directed from the echocardiographic lab for hypertensive urgency as manifested by her chest pain and dizziness.  CT head was unremarkable.  Patient reported intermittent dizziness chest pain and shortness of breath as well as headache and migraine for a long time.  Per ED report cardiology evaluated her echocardiogram which showed a decreased EF functional from 55 to 60% to now 45 to 50%.  Patient was initially going to be discharged but due to her elevated blood pressure and symptoms I was asked to evaluate her           Review of Systems  GENERAL: No fever, No chill, No malaise   HEENT: No change in vision, no ear ache, tinnitus, no sore throat or sinus congestion.   NECK: No pain or stiffness.   PULMONARY: No shortness of breath, no cough or wheeze.   Cardiovascular: Chest pain  GASTROINTESTINAL: No abd pain, No nausea/vomiting, No diarrhea, No melena or bright red blood per rectum.   GENITOURINARY: No urinary frequency, No urgency or pain with urination.   MUSCULOSKELETAL: No joint or muscle pain, no back pain, no recent trauma.   DERMATOLOGIC: No rash, no itching, no lesions.   HEMATOLOGICAL: No easy bruising or bleeding.   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    Enlarged heart      Hypercholesterolemia      Hypertension      IBS (irritable bowel syndrome)      Interstitial cystitis      Sciatica      Sleep apnea      Spinal stenosis      Thyroid disease                 Past  Surgical History:   Procedure Laterality Date    HX CHOLECYSTECTOMY         2008    HX ORTHOPAEDIC         LEFT KNEE    HX ORTHOPAEDIC         LEFT ARM    HX PARTIAL HYSTERECTOMY   2003  STILL HAVE OVARIES    HX PELVIC LAPAROSCOPY        HX THYROIDECTOMY                   Family History   Problem Relation Age of Onset    Diabetes Mother      Heart Disease Mother      Diabetes Father      Cancer Sister      Heart Disease Maternal Aunt      Cancer Paternal Aunt           Social History               Socioeconomic History    Marital status: DIVORCED   Tobacco Use    Smoking status: Every Day       Packs/day: 0.25       Types: Cigarettes    Smokeless tobacco: Never   Substance and Sexual Activity    Alcohol use: Yes    Drug use: Yes       Types: Marijuana       Comment: ONCE EVERY 6 MONTHS    Sexual activity: Not Currently       Partners: Male                    Prior to Admission medications    Medication Sig Start Date End Date Taking? Authorizing Provider   PARoxetine (PAXIL) 40 mg tablet Take 1 Tablet by mouth nightly. Indications: depression 12/04/21     Negron, Kirstin H, DO   dulaglutide (TRULICITY) 8.10 FB/5.1 mL sub-q pen Starter dose: Inject 0.75 mg subcutaneously once weekly; Will increase to 1.5 mg once weekly after 4 weeks .  Indications: type 2 diabetes mellitus 12/04/21     Negron, Kirstin H, DO   dulaglutide (TRULICITY) 1.5 WC/5.8 mL sub-q pen 0.5 mL by SubCUTAneous route every seven (7) days. Indications: type 2 diabetes mellitus 01/01/22     Negron, Kirstin H, DO   levothyroxine (SYNTHROID) 200 mcg tablet Take 1 Tablet by mouth Daily (before breakfast). Indications: a condition with low thyroid hormone levels 10/19/21     Negron, Kirstin H, DO   pregabalin (LYRICA) 50 mg capsule Take 1 Capsule by mouth two (2) times daily as needed (nerve pain). Max Daily Amount: 200 mg. After 1 week, if no side effects and limited benefit, can increase dose to 2 capsules by mouth twice daily. 10/19/21     Negron, Kirstin  H, DO   pantoprazole (PROTONIX) 40 mg tablet Take 40 mg by mouth two (2) times a day. 09/05/21     Provider, Historical   tiZANidine (ZANAFLEX) 4 mg tablet Take 1 tablet up to 3 times daily x 1 week. After 1 week, can continue up to 2 times daily as needed for muscle spasm. If groggy, limit use to nightly only or try 1/2 tablet. 09/15/21     Negron, Kirstin H, DO   metFORMIN (GLUCOPHAGE) 1,000 mg tablet Take 1 Tablet by mouth two (2) times daily (with meals) for 90 days. Indications: type 2 diabetes mellitus 09/06/21 12/05/21   Negron, Kirstin H, DO   cloNIDine HCL (CATAPRES) 0.3 mg tablet Take 1 Tablet by mouth two (2) times a day. 08/23/21     Negron, Kirstin H, DO   linaCLOtide (LINZESS) 145 mcg cap capsule Take 145 mcg by mouth. 06/20/21     Provider, Historical   nitroglycerin (NITROSTAT) 0.4 mg SL tablet 1 Tablet by SubLINGual route  every five (5) minutes as needed for Chest Pain. Up to 3 doses. 06/09/21     Ailene Rud, MD   olmesartan (BENICAR) 40 mg tablet TAKE 1 TABLET BY MOUTH EVERY DAY 05/31/21     Ailene Rud, MD   dapagliflozin (FARXIGA) 10 mg tab tablet Take 1 Tablet by mouth daily. 03/24/21     Negron, Kirstin H, DO   atorvastatin (Lipitor) 40 mg tablet Take 1 Tablet by mouth nightly for 90 days. Indications: high cholesterol 03/24/21 10/19/21   Negron, Kirstin H, DO   traZODone (DESYREL) 50 mg tablet TAKE 1 TABLET BY MOUTH AT BEDTIME AS NEEDED FOR SLEEP 02/21/21     Provider, Historical   hydrOXYzine pamoate (VISTARIL) 50 mg capsule TAKE 1 CAPSULE BY MOUTH 4 TIMES A DAY AS NEEDED FOR ANXIETY 01/05/21     Provider, Historical   glucose blood VI test strips (True Metrix Glucose Test Strip) strip Use to test blood sugar once daily 03/16/21     Negron, Kirstin H, DO   lancets misc Use to test blood sugar once daily 03/16/21     Negron, Kirstin H, DO   Blood-Glucose Meter (True Metrix Glucose Meter) misc 1 Kit by Does Not Apply route daily. 03/16/21     Negron, Kirstin H, DO   Insulin Needles, Disposable,  31 gauge x 5/16" ndle Use twice daily for insulin and victoza injections. 03/16/21     Negron, Kirstin H, DO   SITagliptin (Januvia) 100 mg tablet Take 1 Tablet by mouth daily. Indications: type 2 diabetes mellitus 03/16/21     Negron, Kirstin H, DO   Latuda 40 mg tab tablet Take 1 Tablet by mouth nightly. 03/01/21     Provider, Historical   methocarbamoL (ROBAXIN) 750 mg tablet Take 1 Tablet by mouth four (4) times daily as needed for Muscle Spasm(s) or Pain. 11/30/20     Lovenia Kim, MD   buPROPion [facility]) 75 mg tablet Take 1 Tablet by mouth two (2) times a day. 09/10/20     Ailene Rud, MD   aspirin delayed-release 81 mg tablet 81 mg.       Provider, Historical               Allergies   Allergen Reactions    Vicodin [Hydrocodone-Acetaminophen] Itching    Lisinopril-Hydrochlorothiazide Other (comments)       Dry cough     Pcn [Penicillins] Other (comments)                 Physical Exam:      Visit Vitals  BP (!) 160/119   Pulse (!) 107   Temp 98.1 ??F (36.7 ??C)   Resp 14   Ht 5' 1"  (1.549 m)   Wt 101.6 kg (224 lb)   SpO2 99%   BMI 42.32 kg/m??         Physical Exam:  Tele:   General:  Alert, cooperative, no acute distress   HEENT: Normocephalic, atraumatic. PERRL. Conjunctivae/corneas clear. No drainage or sinus tenderness. Supple neck, trachea midline, no adenopathy.    Lungs:   Clear to auscultation bilaterally   Heart:  Tachycardia, regular  rhythm, S1, S2 normal, no murmur. Palpable distal pulses   Abdomen: Soft, non-tender, not distended. Bowel sounds normal.    Extremities: No edema. Normal cap refill   Pulses: 2+ and symmetric all extremities.   Skin: Warm, dry. No rashes or active lesions/ulcerations   Neurologic: AAOx3, No focal motor or sensory deficit  Psych:                      Normal affect, insight and judgement  Additional:          Lab/Data Review:  Labs: Results:         Chemistry      Recent Labs     12/09/21  1231    GLU 157*    NA 140    K 4.1    CL 105    CO2 25    BUN 10    CREA  0.96    CA 9.4    AGAP 10    BUCR 10*        CBC w/Diff      Recent Labs     12/09/21  1231    WBC 8.7    RBC 4.41    HGB 12.4    HCT 37.4    PLT 265    GRANS 61    LYMPH 32    EOS 2        Coagulation No results for input(s): PTP, INR, APTT, INREXT in the last 72 hours.    Iron/Ferritin No results for input(s): IRON in the last 72 hours.     No lab exists for component: TIBCCALC   BNP No results for input(s): BNPP in the last 72 hours.   Cardiac Enzymes No results for input(s): CPK, CKND1, MYO in the last 72 hours.     No lab exists for component: CKRMB, TROIP   Liver Enzymes No results for input(s): TP, ALB, TBIL, AP in the last 72 hours.     No lab exists for component: SGOT, GPT, DBIL   Thyroid Studies       Lab Results   Component Value Date/Time     TSH 3.25 06/08/2021 10:54 AM              Imaging Reviewed:  CT Results  (Last 48 hours)                    12/09/21 1517   CT HEAD WO CONT Final result     Impression:   No intracranial hemorrhage or acute abnormalities by CT.        Narrative:   CT head without IV contrast.       HISTORY: Hypertension. Headaches.       All CT scans at this facility are performed using dose optimization technique as   appropriate to a performed exam, to include automated exposure control,   adjustment of the mA and/or kV according to patient size (including appropriate   matching for site specific examination) or use of iterative reconstruction   technique.       Moderate degree of motion artifacts. Cortical sulci and ventricles within normal   range. No midline shift or extra-axial collection. No intracranial hemorrhage,   mass lesions or cortical infarct involving a major vessel. Visualized paranasal   sinuses and mastoid air cells are clear.                    XR Results (most recent):  Results from Hospital Encounter encounter on 12/09/21     XR CHEST PORT     Narrative  EXAM:  XR CHEST PORT     INDICATION: chest pain     COMPARISON: Chest radiograph November 30, 2020.      FINDINGS: No focal consolidation. No pneumothorax or pleural  effusion. Stable  cardiomediastinal contours within normal limits. No acute osseous findings.     Impression  No acute cardiopulmonary process.           Assessment:   Active Problems:    Hypertension (06/06/2011)       DM (diabetes mellitus) ([place]) (06/06/2011)       Chest pain (12/10/2012)       Dizziness (12/10/2012)       Hypertensive emergency (12/09/2021)              Plan:   Hypertensive urgency -patient reported that her blood pressure was greater than 200 during the echocardiogram.  She has taken her daily Benecol as well as clonidine with no improvement.  Heart rate was also notable to be tachycardic  -Trial of labetalol IV.  If no improvement may need to start IV drip -?  Cardene  Patient was previously on oral labetalol but stated that her cardiologist took her off of it  -Optimize blood pressure control  -Follow-up with cardiology     Headache/migraine/dizziness -no acute finding on CTA.  Do I am concerned for a posterior stroke such as press.  If headache and dizziness persist in the morning consider obtaining brain MRI        Risk of deterioration:  [x] Low    [] Moderate  [] High     Prophylaxis:  [x] Lovenox  [] Coumadin  [] Hep SQ  [] SCD???s  [] H2B/PPI     Disposition:  [x] Home w/ Family   [] HH PT,OT,RN   [] SNF/LTC   [] SAH/Rehab     Discussed Code Status:         [x] Full Code      [] DNR            ___________________________________________________     Reason for admission, current diagnosis (or pending work-up), treatment plan including consultations reviewed with:     [x] Patient   [x] Family    [] ED Care Manager  [x] ED Doc   [] Specialist :  Total Time Coordinating Admission:    50  minutes    [] Total Critical Care Time:        Lelon Frohlich, DO  12/09/2021, 10:46 PM

## 2021-12-10 NOTE — ED Notes (Signed)
Pt moved from hall b to ed bed [id number] RN  12/10/21 [phone]

## 2021-12-10 NOTE — ED Notes (Signed)
Pt ambulated to nurses station cursing stating "all yall  motherfuckers are just lazy and dont come check on me when I ring my call bell.  Im going to file a lawsuit.  Im out of this bitch"  Pt ambulatory with steady gait.       Terance Hart, RN  12/10/21 [phone]

## 2021-12-10 NOTE — ED Notes (Signed)
Hospitalist paged in regards to pt request for iv pain medication.  Spoke with Dr Milagros Reap who gave verbal order for 10mg  labetelol iv one time now.  No new orders for prn pain.  Pt c/o headache and back pain.  Pt to be moved to ed bed 18 for decreased stimulation.       , RN  12/10/21 [phone]

## 2021-12-10 NOTE — ED Notes (Signed)
Pt removed her iv and ambulated out of the mta with steady gait.  Hospitalist paged to make aware.      Terance Hart, RN  12/10/21 [phone]

## 2021-12-10 NOTE — ED Notes (Signed)
Dr Milagros Reap notified of pt departure     Terance Hart, RN  12/10/21 [phone]

## 2021-12-10 NOTE — Progress Notes (Signed)
Notified by nursing staff that patient eloped from the ED.

## 2021-12-10 NOTE — ED Notes (Signed)
System Downtime Recovery  The EMR experienced a system downtime.  This downtime occurred on February 11 at 0000 AM for a duration of 4 hour(s).  During this downtime paper charting was completed by ER.  The following was documented on paper during the downtime and is now being back-entered: .  The following is remaining on paper and will be scanned into Epic: .          Terance Hart, RN  12/10/21 [phone]

## 2021-12-12 ENCOUNTER — Encounter

## 2021-12-16 LAB — EKG 12-LEAD
Atrial Rate: 90 {beats}/min
Diagnosis: NORMAL
P Axis: 50 degrees
P-R Interval: 176 ms
Q-T Interval: 366 ms
QRS Duration: 88 ms
QTc Calculation (Bazett): 447 ms
R Axis: 33 degrees
T Axis: 58 degrees
Ventricular Rate: 90 {beats}/min

## 2021-12-21 NOTE — Telephone Encounter (Signed)
PCP: Reyes Ivan, DO    Last appt: @LASTENCTHISDEPEXT @  Future Appointments   Date Time Provider Department Center   12/30/2021 11:40 AM 03/01/2022, MD [facility] AMB   01/17/2022  1:30 PM Kirstin H Negron, DO [facility] AMB       Requested Prescriptions     Pending Prescriptions Disp Refills    olmesartan (BENICAR) 40 MG tablet [Pharmacy Med Name: OLMESARTAN MEDOXOMIL 40 MG TAB] 30 tablet 2     Sig: TAKE 1 TABLET BY MOUTH EVERY DAY

## 2021-12-22 MED ORDER — OLMESARTAN MEDOXOMIL 40 MG PO TABS
40 MG | ORAL_TABLET | ORAL | 2 refills | Status: DC
Start: 2021-12-22 — End: 2022-03-07

## 2021-12-30 ENCOUNTER — Ambulatory Visit
Admit: 2021-12-30 | Discharge: 2021-12-30 | Payer: PRIVATE HEALTH INSURANCE | Attending: Cardiovascular Disease | Primary: Family Medicine

## 2021-12-30 DIAGNOSIS — R000 Tachycardia, unspecified: Secondary | ICD-10-CM

## 2021-12-30 NOTE — Progress Notes (Signed)
Identified pt with two pt identifiers(name and DOB). Reviewed record in preparation for visit and have obtained necessary documentation.    Christy [name] presents today for   Chief Complaint   Patient presents with    Follow-up     Post echo        Pt c/o DIZZINESS, SOB, SWELLING.             Christy [name] preferred language for health care discussion is english/other.    Personal Protective Equipment:   Engineer, maintenance was used including: mask-surgical and hands-gloves. Patient was placed on no precaution(s). Patient was not masked.    Precautions:   Patient currently on None  Patient currently roomed with door closed.    Is someone accompanying this pt? no    Is the patient using any DME equipment during OV? no    Depression Screening:  PHQ-9 Questionaire 10/19/2021 09/15/2021 07/13/2021 07/13/2021 06/08/2021 04/14/2021 03/24/2021   Little interest or pleasure in doing things 0 0 0 0 0 0 0   Feeling down, depressed, or hopeless 0 0 0 0 0 0 0   Trouble falling or staying asleep, or sleeping too much - - - - - 0 1   Feeling tired or having little energy - - - - - 0 0   Poor appetite or overeating - - - - - 0 0   Feeling bad about yourself - or that you are a failure or have let yourself or your family down - - - - - 0 0   Trouble concentrating on things, such as reading the newspaper or watching television - - - - - 0 0   Moving or speaking so slowly that other people could have noticed. Or the opposite - being so fidgety or restless that you have been moving around a lot more than usual - - - - - 0 0   PHQ-9 Total Score 0 0 0 0 0 0 1        Learning Assessment:  No question data found.    Abuse Screening:  AMB Abuse Screening 12/30/2021   Do you ever feel afraid of your partner? N   Are you in a relationship with someone who physically or mentally threatens you? N   Is it safe for you to go home? Y          Fall Risk  Fall Risk 12/30/2021   2 or more falls in past year? no   Fall with injury in past year? no          Pt currently taking Anticoagulant therapy? no  Pt currently taking Antiplatelet therapy? no    Coordination of Care:  1. Have you been to the ER, urgent care clinic since your last visit? Hospitalized since your last visit? Yes [facility]    2. Have you seen or consulted any other health care providers outside of the [facility] since your last visit? Include any pap smears or colon screening. no      Please see Red banners under Allergies and Med Rec to remove outside inquires. All correct information has been verified with patient and added to chart.     Medication's patient's would liked removed has been marked not taking to be removed per Verbal order and read back per Merdis Delay, MD

## 2021-12-30 NOTE — Patient Instructions (Addendum)
New Medication/Medication Changes    Wellbutrin 150 take 1 tab twice daily     Start Atenolol 50 mg take 1 tab daily     Start Mounjaro 2.5 mg every 7 days      **please allow 24-48 hrs for medication to be escribed to pharmacy** If you need any refills on medications please contact your pharmacy so that the request can be escribed to the provider for review seven to 10 days prior to being out of medication.

## 2021-12-30 NOTE — Telephone Encounter (Signed)
PCP: Reyes Ivan, DO    Last appt: @LASTENCTHISDEPEXT @  Future Appointments   Date Time Provider Department Center   01/17/2022  1:30 PM Kirstin H Negron, DO [facility] AMB   02/06/2022  1:00 PM 04/08/2022, MD [facility] AMB       Requested Prescriptions     Pending Prescriptions Disp Refills    atenolol (TENORMIN) 50 MG tablet 90 tablet 1     Sig: Take 1 tablet by mouth daily    buPROPion (WELLBUTRIN XL) 150 MG extended release tablet 180 tablet 1     Sig: Take 1 tablet by mouth in the morning and at bedtime    Tirzepatide (MOUNJARO) 2.5 MG/0.5ML SOPN SC injection 4 Adjustable Dose Pre-filled Pen Syringe 0     Sig: Inject 0.5 mLs into the skin once a week

## 2022-01-06 MED ORDER — ATENOLOL 50 MG PO TABS
50 MG | ORAL_TABLET | Freq: Every day | ORAL | 1 refills | Status: DC
Start: 2022-01-06 — End: 2022-02-06

## 2022-01-06 NOTE — Telephone Encounter (Signed)
PCP: Reyes Ivan, DO    Last appt:  @[email or web address] @  Future Appointments   Date Time Provider Department Center   01/17/2022  1:30 PM Kirstin 01/19/2022, DO [facility] BS AMB   02/06/2022  1:00 PM 04/08/2022, MD [facility] BS AMB       Requested Prescriptions     Pending Prescriptions Disp Refills    buPROPion (WELLBUTRIN XL) 150 MG extended release tablet 30 tablet 3     Sig: Take 1 tablet by mouth every morning    Tirzepatide (MOUNJARO) 2.5 MG/0.5ML SOPN SC injection 4 Adjustable Dose Pre-filled Pen Syringe 1     Sig: Inject 0.5 mLs into the skin once a week

## 2022-01-07 MED ORDER — MOUNJARO 2.5 MG/0.5ML SC SOPN
2.5 MG/0.5ML | SUBCUTANEOUS | 1 refills | Status: DC
Start: 2022-01-07 — End: 2022-02-06

## 2022-01-07 MED ORDER — BUPROPION HCL ER (XL) 150 MG PO TB24
150 MG | ORAL_TABLET | Freq: Every morning | ORAL | 3 refills | Status: AC
Start: 2022-01-07 — End: 2022-02-06

## 2022-01-11 NOTE — Progress Notes (Signed)
Subjective:      Christy [name] is in the office today for cardiac reevaluation. She is a 46 year old woman that has been told she had enlarged heart in the past. Recent CT CTA of the chest demonstrated a normal-sized heart.  She had a normal nuclear stress test done on 07/02/2018.    The patient reported a several year history of chest pain. It was described as "stabbing "in quality. It was also described as sometimes dull. It could last up to 5 minutes. It generally goes away without intervention. She would took an occasional aspirin. She localized the discomfort to the left peristernal and inframammary areas. The patient also experienced shortness of breath. She said that one flight of steps would cause her to be very short of breath. She had no PND or orthopnea.    In the office on 11/26/2020, she reported that she was feeling "okay ".  She reported a recent  ED visit.  She  presented with fatigue, malaise and nausea.  She tested negative for Covid.    At a prior appointment, metoprolol was added to her regimen.  She felt that it was making her lower extremities swell.  She was given clonidine which she is taking only at night.  She remains on the Benicar.    She had a recent echocardiogram.  It was completed on 12/09/2021.  Her ejection fraction was slightly reduced at 45 to 50%.  There was mild concentric LVH.  She is trying to lose weight but having a lot of difficulty.     Patient's cardiac risk factors are smoking/ tobacco exposure, family history, dyslipidemia, diabetes mellitus, hypertension.        Patient Active Problem List    Diagnosis Date Noted    History of nuclear stress test 09/18/2020    Palpitations 09/18/2020    Tobacco abuse 09/18/2020    Family history of premature CAD 09/18/2020    Obesity, morbid ([place]) 10/22/2019    Spinal stenosis     IBS (irritable bowel syndrome)     Chest pain 12/10/2012    Dizziness 12/10/2012    CAD (coronary artery disease) 06/06/2011    Hyperlipidemia with target low density  lipoprotein (LDL) cholesterol less than 70 mg/dL 06/06/2011    Hypertension 06/06/2011    Obesity 06/06/2011    DM (diabetes mellitus) ([place]) 06/06/2011    GERD (gastroesophageal reflux disease) 06/06/2011     Current Outpatient Medications   Medication Sig Dispense Refill    olmesartan (BENICAR) 40 mg tablet TAKE 1 TABLET BY MOUTH EVERY DAY 30 Tablet 5    dapagliflozin (FARXIGA) 10 mg tab tablet Take 1 Tablet by mouth daily. 90 Tablet 1    atorvastatin (Lipitor) 40 mg tablet Take 1 Tablet by mouth nightly for 90 days. Indications: high cholesterol 90 Tablet 1    docusate sodium (COLACE) 100 mg capsule Take 1 Capsule by mouth two (2) times a day for 90 days. 60 Capsule 2    traZODone (DESYREL) 50 mg tablet TAKE 1 TABLET BY MOUTH AT BEDTIME AS NEEDED FOR SLEEP      hydrOXYzine pamoate (VISTARIL) 50 mg capsule TAKE 1 CAPSULE BY MOUTH 4 TIMES A DAY AS NEEDED FOR ANXIETY      glucose blood VI test strips (True Metrix Glucose Test Strip) strip Use to test blood sugar once daily 100 Strip 11    lancets misc Use to test blood sugar once daily 1 Each 11    Blood-Glucose Meter (True Metrix Glucose Meter)  misc 1 Kit by Does Not Apply route daily. 1 Each 0    Insulin Needles, Disposable, 31 gauge x 5/16" ndle Use twice daily for insulin and victoza injections. 100 Each 11    SITagliptin (Januvia) 100 mg tablet Take 1 Tablet by mouth daily. Indications: type 2 diabetes mellitus 90 Tablet 2    Latuda 40 mg tab tablet Take 1 Tablet by mouth nightly.      PARoxetine (PAXIL) 30 mg tablet Take 30 mg by mouth nightly.      liraglutide (Victoza 3-Pak) 0.6 mg/0.1 mL (18 mg/3 mL) pnij ADMINISTER 1.8MG SUBCUTANEOUSLY ONCE A DAY  Indications: type 2 diabetes mellitus 9 Adjustable Dose Pre-filled Pen Syringe 0    methocarbamoL (ROBAXIN) 750 mg tablet Take 1 Tablet by mouth four (4) times daily as needed for Muscle Spasm(s) or Pain. 50 Tablet 0    levothyroxine (SYNTHROID) 200 mcg tablet Take 1 Tablet by mouth Daily (before breakfast). 30  Tablet 2    buPROPion (WELLBUTRIN) 75 mg tablet Take 1 Tablet by mouth two (2) times a day. 60 Tablet 5    cloNIDine HCL (CATAPRES) 0.3 mg tablet Take 1 Tablet by mouth two (2) times a day. 180 Tablet 1    insulin glargine (Lantus Solostar U-100 Insulin) 100 unit/mL (3 mL) inpn Inject 10 units subcutaneously every night at bedtime. 45 mL 3    aspirin delayed-release 81 mg tablet 81 mg.      nitroglycerin (NITROSTAT) 0.4 mg SL tablet 1 Tablet by SubLINGual route every five (5) minutes as needed for Chest Pain. Up to 3 doses. 25 Tablet 3    metFORMIN (GLUCOPHAGE) 1,000 mg tablet Take 1 Tablet by mouth two (2) times daily (with meals) for 90 days. 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    Enlarged heart     Hypertension     IBS (irritable bowel syndrome)     Interstitial cystitis     Sciatica     Spinal stenosis      Past Surgical History:   Procedure Laterality Date    HX CHOLECYSTECTOMY      2008    HX ORTHOPAEDIC      LEFT KNEE    HX ORTHOPAEDIC      LEFT ARM    HX PARTIAL HYSTERECTOMY  2003    STILL HAVE OVARIES    HX PELVIC LAPAROSCOPY      HX THYROIDECTOMY       Family History   Problem Relation Age of Onset    Diabetes Mother     Heart Disease Mother     Diabetes Father     Cancer Sister     Heart Disease Maternal Aunt     Cancer Paternal Aunt      Social History     Tobacco Use   Smoking Status Every Day    Packs/day: 0.25    Types: Cigarettes   Smokeless  Tobacco Never          Review of Systems, additional:  Constitutional: negative  Eyes: negative  Respiratory: positive for dyspnea on exertion  Cardiovascular: positive  for chest pain, palpitations, dyspnea on exertion  Gastrointestinal: negative  Musculoskeletal:negative  Neurological: negative  Behvioral/Psych: negative  Endocrine: negative  ENT: negative    Objective:     Visit Vitals  BP 160/95 recheck   Pulse 114   Temp 97.8 ??F (36.6 ??C) (Temporal)   Wt 230 pounds   SpO2 100 %   BMI 43.46 kg/m??     General:  alert, cooperative, no distress   Chest Wall: inspection normal - no chest wall deformities or tenderness, respiratory effort normal   Lung: clear to auscultation bilaterally   Heart:  normal rate and regular rhythm, S1 and S2 normal, no murmurs noted, no gallops noted, no JVD   Abdomen: soft, non-tender. Bowel sounds normal. No masses,  no organomegaly   Extremities: extremities normal, atraumatic, no cyanosis or edema Skin: no rashes   Neuro: alert, oriented, normal speech, no focal findings or movement disorder noted     EKG: 06/08/2021.  Sinus tachycardia.  Nondiagnostic ST wave abnormalities.    Assessment/Plan:       ICD-10-CM ICD-9-CM    1. Primary hypertension, controlled   BP in the office today.  Asymptomatic.Marland Kitchen  Started metoprolol 100 mg twice daily at last visit.  She stopped the metoprolol because of  leg swelling.  The swelling improved.  She was prescribed clonidine 0.3 mg twice daily and takes 1 at night.  .  BP quite elevated in the office today.  We will start atenolol 50 mg daily. I10 401.9 ECHO ADULT COMPLETE      TSH AND FREE T4   2. DOE (dyspnea on exertion)  R06.00 786.09 ECHO ADULT COMPLETE      TSH AND FREE T4      CARDIAC HOLTER MONITOR      AMB POC EKG ROUTINE W/ 12 LEADS, INTER & REP   3. Screening for thyroid disorder , order TSH Z13.29 V77.0 TSH AND FREE T4   4. Hyperlipidemia with target low density lipoprotein (LDL) cholesterol less than 70 mg/dL, 03/16/2021.. Total cholesterol 272 HDL 56.  Triglycerides 160.  LDL 184 patient on Lipitor 20 mg daily. E78.5 272.4    5. Diabetes mellitus due to underlying condition with hyperosmolarity without  coma, unspecified whether long term insulin use (HCC)  E08.00 249.20    6. History of nuclear stress test , 07/02/2018, low risk. Z92.89 V15.89    7. Chest pain, unspecified type , still having occasional palpitations. R07.9 786.50    8. Palpitations , recent increase in symptoms, echocardiogram completed on 12/09/2021.  Ejection fraction was 45 to 50%.  Mildly increased wall thickness.  Mild concentric LVH. R00.2 785.1    9. Tobacco abuse , increase Wellbutrin 150 mg twice daily. Z72.0 305.1    10     Severe obesity, started Mounjaro 2.5 mg weekly.  DC Trulicity.  Return in 5 weeks.

## 2022-01-17 ENCOUNTER — Encounter: Attending: Family Medicine | Primary: Family Medicine

## 2022-01-23 NOTE — Telephone Encounter (Signed)
No respond from the provider. Encounter will be closed due to transition to new system.

## 2022-01-28 ENCOUNTER — Encounter

## 2022-01-30 MED ORDER — FARXIGA 10 MG PO TABS
10 MG | ORAL_TABLET | ORAL | 3 refills | Status: AC
Start: 2022-01-30 — End: 2022-12-12

## 2022-01-30 NOTE — Telephone Encounter (Signed)
Last seen 10/19/21    Last filled 03/24/21 qty 90 w/ 1 refill    Future Appointments   Date Time Provider Department Center   02/06/2022  1:00 PM Aggie Cosier, MD [facility] BS AMB   02/17/2022 11:30 AM Kirstin H Negron, DO [facility] BS AMB

## 2022-02-06 ENCOUNTER — Ambulatory Visit
Admit: 2022-02-06 | Discharge: 2022-02-06 | Payer: PRIVATE HEALTH INSURANCE | Attending: Cardiovascular Disease | Primary: Family Medicine

## 2022-02-06 DIAGNOSIS — I10 Essential (primary) hypertension: Secondary | ICD-10-CM

## 2022-02-06 MED ORDER — BUPROPION HCL ER (XL) 150 MG PO TB24
150 MG | ORAL_TABLET | Freq: Two times a day (BID) | ORAL | 3 refills | Status: DC
Start: 2022-02-06 — End: 2022-06-12

## 2022-02-06 MED ORDER — MOUNJARO 2.5 MG/0.5ML SC SOPN
2.5 MG/0.5ML | SUBCUTANEOUS | 1 refills | Status: DC
Start: 2022-02-06 — End: 2022-02-17

## 2022-02-06 MED ORDER — ATENOLOL 50 MG PO TABS
50 MG | ORAL_TABLET | Freq: Every day | ORAL | 3 refills | Status: AC
Start: 2022-02-06 — End: 2022-03-27

## 2022-02-06 NOTE — Progress Notes (Unsigned)
Identified pt with two pt identifiers(name and DOB). Reviewed record in preparation for visit and have obtained necessary documentation.    Christy [name] presents today for   Chief Complaint   Patient presents with    Follow-up     Post biotel and med change       Pt c/o SOB, CHEST PAIN/ PRESSURE,SWELLING.             Christy [name] preferred language for health care discussion is english/other.    Personal Protective Equipment:   Engineer, maintenance was used including: mask-surgical and hands-gloves. Patient was placed on no precaution(s). Patient was masked.    Precautions:   Patient currently on None  Patient currently roomed with door closed.    Is someone accompanying this pt? no    Is the patient using any DME equipment during OV? no    Depression Screening:  PHQ-9 Questionaire 10/19/2021 09/15/2021 07/13/2021 07/13/2021 06/08/2021 04/14/2021 03/24/2021   Little interest or pleasure in doing things 0 0 0 0 0 0 0   Feeling down, depressed, or hopeless 0 0 0 0 0 0 0   Trouble falling or staying asleep, or sleeping too much - - - - - 0 1   Feeling tired or having little energy - - - - - 0 0   Poor appetite or overeating - - - - - 0 0   Feeling bad about yourself - or that you are a failure or have let yourself or your family down - - - - - 0 0   Trouble concentrating on things, such as reading the newspaper or watching television - - - - - 0 0   Moving or speaking so slowly that other people could have noticed. Or the opposite - being so fidgety or restless that you have been moving around a lot more than usual - - - - - 0 0   PHQ-9 Total Score 0 0 0 0 0 0 1        Learning Assessment:  No question data found.    Abuse Screening:  AMB Abuse Screening 02/06/2022 12/30/2021   Do you ever feel afraid of your partner? N N   Are you in a relationship with someone who physically or mentally threatens you? N N   Is it safe for you to go home? Y Y          Fall Risk  Fall Risk 02/06/2022 12/30/2021   2 or more falls in  past year? no no   Fall with injury in past year? no no         Pt currently taking Anticoagulant therapy? no  Pt currently taking Antiplatelet therapy? yes    Coordination of Care:  1. Have you been to the ER, urgent care clinic since your last visit? Hospitalized since your last visit? no    2. Have you seen or consulted any other health care providers outside of the [facility] since your last visit? Include any pap smears or colon screening. no      Please see Red banners under Allergies and Med Rec to remove outside inquires. All correct information has been verified with patient and added to chart.     Medication's patient's would liked removed has been marked not taking to be removed per Verbal order and read back per Merdis Delay, MD

## 2022-02-06 NOTE — Patient Instructions (Addendum)
New Medication/Medication Changes    Increase Atenolol to 75 mg once daily     **please allow 24-48 hrs for medication to be escribed to pharmacy** If you need any refills on medications please contact your pharmacy so that the request can be escribed to the provider for review seven to 10 days prior to being out of medication.            New Location Address- projected for the month of May 2023    [facility]  [address] [place], [place] [address]

## 2022-02-06 NOTE — Telephone Encounter (Signed)
PCP: Reyes Ivan, DO    Last appt: @[email or web address] @  Future Appointments   Date Time Provider Department Center   02/17/2022 11:30 AM Kirstin 02/19/2022, DO [facility] BS AMB   03/22/2022  2:40 PM 03/24/2022, MD [facility] BS AMB       Requested Prescriptions     Pending Prescriptions Disp Refills    atenolol (TENORMIN) 50 MG tablet 90 tablet 3     Sig: Take 1.5 tablets by mouth daily    Tirzepatide (MOUNJARO) 2.5 MG/0.5ML SOPN SC injection 4 Adjustable Dose Pre-filled Pen Syringe 1     Sig: Inject 0.5 mLs into the skin once a week    buPROPion (WELLBUTRIN XL) 150 MG extended release tablet 60 tablet 3     Sig: Take 1 tablet by mouth in the morning and at bedtime

## 2022-02-08 ENCOUNTER — Encounter

## 2022-02-09 MED ORDER — DULAGLUTIDE 3 MG/0.5ML SC SOPN
3 MG/0.5ML | SUBCUTANEOUS | 0 refills | Status: DC
Start: 2022-02-09 — End: 2022-02-17

## 2022-02-09 NOTE — Telephone Encounter (Signed)
Last seen: 10/19/21    Last filled: 12/04/21      dulaglutide (TRULICITY) 1.5 mg/0.5 mL sub-q pen   Dose: 1.5 mg Route: SubCUTAneous Frequency: EVERY 7 DAYS   Dispense Quantity: 4 Each Refills: 1    Indications of Use: type 2 diabetes mellitus     Future Appointments   Date Time Provider Department Center   02/17/2022 11:30 AM Kirstin H Negron, DO [facility] BS AMB   03/22/2022  2:40 PM Aggie Cosier, MD [facility] BS AMB

## 2022-02-17 ENCOUNTER — Ambulatory Visit
Admit: 2022-02-17 | Discharge: 2022-02-17 | Payer: PRIVATE HEALTH INSURANCE | Attending: Family Medicine | Primary: Family Medicine

## 2022-02-17 DIAGNOSIS — E119 Type 2 diabetes mellitus without complications: Secondary | ICD-10-CM

## 2022-02-17 LAB — AMB POC HEMOGLOBIN A1C: Hemoglobin A1C, POC: 8 %

## 2022-02-17 MED ORDER — TIZANIDINE HCL 4 MG PO TABS
4 MG | ORAL_TABLET | ORAL | 0 refills | Status: DC
Start: 2022-02-17 — End: 2022-03-07

## 2022-02-17 MED ORDER — RIMEGEPANT SULFATE 75 MG PO TBDP
75 MG | ORAL_TABLET | ORAL | 5 refills | Status: AC
Start: 2022-02-17 — End: ?

## 2022-02-17 MED ORDER — DULAGLUTIDE 4.5 MG/0.5ML SC SOPN
4.5 MG/0.5ML | SUBCUTANEOUS | 0 refills | Status: AC
Start: 2022-02-17 — End: 2022-03-23

## 2022-02-17 MED ORDER — PREGABALIN 100 MG PO CAPS
100 MG | ORAL_CAPSULE | Freq: Two times a day (BID) | ORAL | 0 refills | Status: AC
Start: 2022-02-17 — End: 2022-03-23

## 2022-02-17 MED ORDER — FLUCONAZOLE 150 MG PO TABS
150 MG | ORAL_TABLET | Freq: Once | ORAL | 0 refills | Status: AC
Start: 2022-02-17 — End: 2022-02-17

## 2022-02-17 NOTE — Patient Instructions (Signed)
If you experience nausea, you may find the following tips helpful:  Eat smaller meals  Try splitting your 3 daily meals into 4 or more smaller ones  Stop eating when you feel full  Avoid fat or fatty foods  Try eating bland foods like toast, crackers, or rice  Avoid skipping meals    Where to Learn More  If you have any questions or problems with your TRULICITY Single-Dose Pen, contact [employer] at 1-800-[facility]-Rx (1-800-545-5979) or you can also visit website at: www.trulicity.com.

## 2022-02-17 NOTE — Progress Notes (Incomplete)
Christy [name] (DOB: [birth year]) is a 46 y.o. female, ESTABLISHED patient, here for FOLLOW UP:    ICD-10-CM    1. Type 2 diabetes mellitus without complication, without long-term current use of insulin (HCC)  E11.9 AMB POC HEMOGLOBIN A1C      2. Elevated C-reactive protein (CRP)  R79.82       3. Elevated erythrocyte sedimentation rate  R70.0         Assessment   Plan   ***  #Chronic mid back pain 2/2 Spinal stenosis x Years - Uncontrolled  Patient reports previously seen by PT, Ortho, Spinal surgeon, Pain management. Reports had injections before for back without any relief.   Rec review Lumbar spine 12/11/18 - No spondylolisthesis. No compression deformity. Mild endplate spondylosis anteriorly, stable. No significant loss of discal height. Facets are intact. No spondylolysis.   Rec review Thoracic spine 09/18/18 - Mild multilevel spinal degenerative changes.   Last MRI >1year ago   Previously tried Flexeril, Gabapentin up to 1200mg  BID without notable benefit.   Tried Lyrica - no notable benefit, denies AE    Plan to try alternative Tizanidine and Lyrica to help manage symptoms.      #NIDDM2 - Goal A1C <7  Hypoglycemia? Denies  Limited improvement despite good compliance with Victoza, Trulicity; Plan to switch to alternative of Semaglutide 2mg  and D/C Lantus which I think is contributing to difficulty with weight loss despite good reduction in appetite   Tolerating other diabetes medications without notable AE, will continue as described below     Hemoglobin A1C, POC   Date Value Ref Range Status   02/17/2022 8.0 % Final           Hemoglobin A1c (POC)   Date Value Ref Range Status   10/19/2021 7.8 % Final     Key Antihyperglycemic Medications            FARXIGA 10 MG tablet (Taking)    Sig: TAKE 1 TABLET BY MOUTH EVERY DAY    Notes to Pharmacy: DX Code Needed  .    insulin glargine (LANTUS SOLOSTAR) 100 UNIT/ML injection pen (Taking)    Class: Historical Med    metFORMIN (GLUCOPHAGE) 1000 MG tablet (Taking)    Class:  Historical Med    SITagliptin (JANUVIA) 100 MG tablet (Taking)    Class: Historical Med          #Peripheral edema - limited response to elevation  Cannot r/o structural heart changes contributing, plan to reeval Echo and encouraged to schedule follow up with Cardiology to review (Patient was sent for stress testing but unable to complete due to insurance)  Provided handout regarding optimal use of conservative treatments for venous reflux to try prior to visit      #Mod concentric hypertrophy with #reduced systolic function of right ventricle   #HTN - Goal <130/80  BP cuff at home? Yes, reports home average closer to 120-130/80  Reviewed goals of BP and encouraged to monitor and if blood pressure measurements at home are close to or higher than 140/90 frequently then we should make adjustments to medication regimen. Reviewed risk of stroke or cardiovascular changes with persistently elevated BP.    Tolerating medications without notable AE; Future consideration: addition of diuretic (olmesartan-hctz combo?)  Established with Cardio - rec review last visit   BP Readings from Last 3 Encounters:   02/17/22 (!) 146/100   02/06/22 (!) 181/107   12/30/21 (!) 152/100     #HLD - Goal <  70  Tolerating medications without notable AE, will continue regimen unchanged               Lab Results   Component Value Date/Time     LDL-CHOLESTEROL 89 10/22/2019 12:00 AM     LDL, calculated 184 (H) 03/16/2021 09:04 AM      The 10-year ASCVD risk score Denman George(Goff DC Jr., et al., 2013) is: 28.1%    Values used to calculate the score:      Age: [age] years      Sex: Female      Is Non-Hispanic African American: Yes      Diabetic: Yes      Tobacco smoker: Yes      Systolic Blood Pressure: 154 mmHg      Is BP treated: Yes      HDL Cholesterol: 56 MG/DL      Total Cholesterol: 272 MG/DL     Key CAD CHF Meds            atenolol (TENORMIN) 50 MG tablet (Taking)    Sig - Route: Take 1.5 tablets by mouth daily - Oral    Notes to Pharmacy: Dose increase     olmesartan (BENICAR) 40 MG tablet (Taking)    Sig: TAKE 1 TABLET BY MOUTH EVERY DAY    atorvastatin (LIPITOR) 40 MG tablet (Taking)    Class: Historical Med    cloNIDine (CATAPRES) 0.3 MG tablet (Taking)    Class: Historical Med          #Acquired Hypothyroidism - well controlled   Thyroid antibodies? Negative May 2022  Thyroid U/S? Never done  Tolerating medications without notable AE, will continue regimen unchanged               Lab Results   Component Value Date/Time     TSH 3.71 03/16/2021 09:04 AM     T4, Free 1.1 03/16/2021 09:04 AM      #Migraines  - uncontrolled, having 3-4 HA weekly  Previously saw Neuro for management  Previously used injection CGRP inhibitors (great relief), nasal triptan, PO triptan, Topamax  Reviewed risk/benefits of preventative vs abortive therapy for migraine treatment and given debilitation and interested to see coverage for restarting CGRP therapy      #GERD - Reviewed behavioral and dietary changes to reduce GERD symptoms. Handout provided.   #Hx of IBS  #S/p cholecystectomy 2009  Suspect #constipation worsened by #dehydration contributing to above  Newly established with GI and started on Linzess. Rec review last visit 06/27/21  Last EGD/Colonoscopy 2022  Rec review CT Abd/Pelv 03/30/21 with moderate stool burden, mild hepatic steatosis  Reviewed can consider increase in dose of Linzess if limited improvement on current dosing.      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             Lab Results   Component Value Date/Time     WBC 7.7 03/16/2021 09:04 AM     HGB 10.8 (L) 03/16/2021 09:04 AM     HCT 33.6 (L) 03/16/2021 09:04 AM     PLATELET 232 03/16/2021 09:04 AM     MCV 87.7 03/16/2021 09:04 AM                Lab Results   Component Value Date/Time     Ferritin 114 03/24/2021 09:51 AM      #Vit B12 Def - reviewed dx and recommended supplement dosing  Lab Results   Component Value Date/Time      Vitamin B12 202 (L) 03/16/2021 09:04 AM      #Vit D Def - reviewed dx and recommended supplement dosing        Lab Results   Component Value Date/Time     Vitamin D 25-Hydroxy 18.1 (L) 03/16/2021 09:04 AM                 #Body mass index is 43.27 kg/m.  Counseled on diet and exercise methods. Positive reinforcement provided.   Interested in referral to Bariatrics, ordered 07/13/21  Wt Readings from Last 3 Encounters:   02/17/22 222 lb 12.8 oz (101.1 kg)   02/06/22 230 lb (104.3 kg)   12/30/21 230 lb (104.3 kg)                Orders Placed This Encounter   Procedures   . AMB POC HEMOGLOBIN A1C     No follow-ups on file.      Subjective   See A/P     Current Outpatient Medications   Medication Instructions   . aspirin 81 mg   . atenolol (TENORMIN) 75 mg, Oral, DAILY   . atorvastatin (LIPITOR) 40 mg, Oral   . buPROPion (WELLBUTRIN XL) 150 mg, Oral, 2 times daily   . cloNIDine (CATAPRES) 0.3 mg, Oral, 2 TIMES DAILY   . FARXIGA 10 MG tablet TAKE 1 TABLET BY MOUTH EVERY DAY   . gabapentin (NEURONTIN) 300 mg, Oral, 3 TIMES DAILY   . hydrocortisone 2.5 % cream 1 application, Topical, 2 TIMES DAILY   . hydrOXYzine pamoate (VISTARIL) 50 MG capsule TAKE 1 CAPSULE BY MOUTH 4 TIMES A DAY AS NEEDED FOR ANXIETY   . insulin glargine (LANTUS SOLOSTAR) 100 UNIT/ML injection pen Inject 10 units subcutaneously every night at bedtime.  Indications: type 2 diabetes mellitus   . Lancets MISC Use to test blood sugar once daily   . levothyroxine (SYNTHROID) 200 mcg, Oral, DAILY BEFORE BREAKFAST   . linaclotide (LINZESS) 145 mcg, Oral   . lurasidone (LATUDA) 40 MG TABS tablet 1 tablet, Oral   . metFORMIN (GLUCOPHAGE) 1,000 mg, Oral, 2 TIMES DAILY WITH MEALS   . methocarbamol (ROBAXIN) 750 mg, Oral, 4 TIMES DAILY PRN   . nitroGLYCERIN (NITROSTAT) 0.4 mg, SubLINGual   . olmesartan (BENICAR) 40 MG tablet TAKE 1 TABLET BY MOUTH EVERY DAY   . pantoprazole (PROTONIX) 40 mg, Oral, 2 TIMES DAILY   . PARoxetine (PAXIL) 40 mg   . pregabalin (LYRICA)  50 mg, 2 TIMES DAILY PRN   . Qulipta 60 mg, Oral, DAILY   . SITagliptin (JANUVIA) 100 mg, Oral, DAILY   . tiZANidine (ZANAFLEX) 4 MG tablet Take 1 tablet up to 3 times daily x 1 week. After 1 week, can continue up to 2 times daily as needed for muscle spasm. If groggy, limit use to nightly only or try 1/2 tablet.   . traZODone (DESYREL) 50 MG tablet TAKE 1 TABLET BY MOUTH AT BEDTIME AS NEEDED FOR SLEEP      Objective    height is 5\' 1"  (1.549 m) and weight is 222 lb 12.8 oz (101.1 kg). Her temporal temperature is 98.2 F (36.8 C). Her blood pressure is 146/100 (abnormal) and her pulse is 95. Her respiration is 15 and oxygen saturation is 100%.   Physical Exam     {Time Documentation Optional:[id number]}  , DO  Family Medicine  02/17/2022

## 2022-02-17 NOTE — Progress Notes (Unsigned)
Christy [name] is a 46 y.o. female (DOB: Mar 15, 1976) presenting to address:    Chief Complaint   Patient presents with    Diabetes    Hypertension       Vitals:    02/17/22 1130   BP: (!) 159/103   Pulse: 95   Resp: 15   Temp: 98.2 F (36.8 C)   SpO2: 100%       Coordination of Care Questionaire:   1. "Have you been to the ER, urgent care clinic since your last visit?  Hospitalized since your last visit?"  [place], was admitted after getting echo; for chest pain, lightheadedness, dizziness    2. "Have you seen or consulted any other health care providers outside of [facility] since your last visit?"  cardiology      3. For patients aged 7-75: Has the patient had a colonoscopy / FIT/ Cologuard? No      If the patient is female:    4. For patients aged 53-74: Has the patient had a mammogram within the past 2 years? No      5. For patients aged 21-65: Has the patient had a pap smear? NA - based on age or sex    Advanced Directive:   1. Do you have an Advanced Directive? No    2. Would you like information on Advanced Directives? No

## 2022-02-21 NOTE — Telephone Encounter (Signed)
Error

## 2022-02-22 NOTE — Other (Signed)
Reviewed in office with Patient

## 2022-02-24 NOTE — Progress Notes (Signed)
Subjective:      Christy [name] is in the office today for cardiac reevaluation. She is a 46 year old woman that has been told she had enlarged heart in the past. Recent CT CTA of the chest demonstrated a normal-sized heart.  She had a normal nuclear stress test done on 07/02/2018.    The patient reported a several year history of chest pain. It was described as "stabbing "in quality. It was also described as sometimes dull. It could last up to 5 minutes. It generally went away without intervention. She would took an occasional aspirin. She localized the discomfort to the left peristernal and inframammary areas. The patient also experienced shortness of breath. She said that one flight of steps would cause her to be very short of breath. She had no PND or orthopnea.    In the office on 11/26/2020, she reported that she was feeling "okay ".  She reported a recent ED visit.  She  presented with fatigue, malaise and nausea.  She tested negative for Covid.    At a prior appointment, metoprolol was added to her regimen.  She felt that it was making her lower extremities swell.  She was given clonidine which she is taking only at night.  She remains on the Benicar.    She had a recent echocardiogram.  It was completed on 12/09/2021.  Her ejection fraction was slightly reduced at 45 to 50%.  There was mild concentric LVH.  She is trying to lose weight but having a lot of difficulty.    In the office today she reports occasional shortness of breath.  She is trying to walk more for exercise.     Patient's cardiac risk factors are smoking/ tobacco exposure, family history, dyslipidemia, diabetes mellitus, hypertension.        Patient Active Problem List    Diagnosis Date Noted    History of nuclear stress test 09/18/2020    Palpitations 09/18/2020    Tobacco abuse 09/18/2020    Family history of premature CAD 09/18/2020    Obesity, morbid ([place]) 10/22/2019    Spinal stenosis     IBS (irritable bowel syndrome)     Chest pain 12/10/2012     Dizziness 12/10/2012    CAD (coronary artery disease) 06/06/2011    Hyperlipidemia with target low density lipoprotein (LDL) cholesterol less than 70 mg/dL 06/06/2011    Hypertension 06/06/2011    Obesity 06/06/2011    DM (diabetes mellitus) ([place]) 06/06/2011    GERD (gastroesophageal reflux disease) 06/06/2011     Current Outpatient Medications   Medication Sig Dispense Refill    olmesartan (BENICAR) 40 mg tablet TAKE 1 TABLET BY MOUTH EVERY DAY 30 Tablet 5    dapagliflozin (FARXIGA) 10 mg tab tablet Take 1 Tablet by mouth daily. 90 Tablet 1    atorvastatin (Lipitor) 40 mg tablet Take 1 Tablet by mouth nightly for 90 days. Indications: high cholesterol 90 Tablet 1    docusate sodium (COLACE) 100 mg capsule Take 1 Capsule by mouth two (2) times a day for 90 days. 60 Capsule 2    traZODone (DESYREL) 50 mg tablet TAKE 1 TABLET BY MOUTH AT BEDTIME AS NEEDED FOR SLEEP      hydrOXYzine pamoate (VISTARIL) 50 mg capsule TAKE 1 CAPSULE BY MOUTH 4 TIMES A DAY AS NEEDED FOR ANXIETY      glucose blood VI test strips (True Metrix Glucose Test Strip) strip Use to test blood sugar once daily 100 Strip 11  lancets misc Use to test blood sugar once daily 1 Each 11    Blood-Glucose Meter (True Metrix Glucose Meter) misc 1 Kit by Does Not Apply route daily. 1 Each 0    Insulin Needles, Disposable, 31 gauge x 5/16" ndle Use twice daily for insulin and victoza injections. 100 Each 11    SITagliptin (Januvia) 100 mg tablet Take 1 Tablet by mouth daily. Indications: type 2 diabetes mellitus 90 Tablet 2    Latuda 40 mg tab tablet Take 1 Tablet by mouth nightly.      PARoxetine (PAXIL) 30 mg tablet Take 30 mg by mouth nightly.      liraglutide (Victoza 3-Pak) 0.6 mg/0.1 mL (18 mg/3 mL) pnij ADMINISTER 1.8MG SUBCUTANEOUSLY ONCE A DAY  Indications: type 2 diabetes mellitus 9 Adjustable Dose Pre-filled Pen Syringe 0    methocarbamoL (ROBAXIN) 750 mg tablet Take 1 Tablet by mouth four (4) times daily as needed for Muscle Spasm(s) or Pain. 50  Tablet 0    levothyroxine (SYNTHROID) 200 mcg tablet Take 1 Tablet by mouth Daily (before breakfast). 30 Tablet 2    buPROPion (WELLBUTRIN) 75 mg tablet Take 1 Tablet by mouth two (2) times a day. 60 Tablet 5    cloNIDine HCL (CATAPRES) 0.3 mg tablet Take 1 Tablet by mouth two (2) times a day. 180 Tablet 1    insulin glargine (Lantus Solostar U-100 Insulin) 100 unit/mL (3 mL) inpn Inject 10 units subcutaneously every night at bedtime. 45 mL 3    aspirin delayed-release 81 mg tablet 81 mg.      nitroglycerin (NITROSTAT) 0.4 mg SL tablet 1 Tablet by SubLINGual route every five (5) minutes as needed for Chest Pain. Up to 3 doses. 25 Tablet 3    metFORMIN (GLUCOPHAGE) 1,000 mg tablet Take 1 Tablet by mouth two (2) times daily (with meals) for 90 days. 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    Enlarged heart     Hypertension     IBS (irritable bowel syndrome)     Interstitial cystitis     Sciatica     Spinal stenosis      Past Surgical History:   Procedure Laterality Date    HX CHOLECYSTECTOMY      2008    HX ORTHOPAEDIC      LEFT KNEE    HX ORTHOPAEDIC      LEFT ARM    HX PARTIAL HYSTERECTOMY  2003    STILL HAVE OVARIES    HX PELVIC LAPAROSCOPY      HX THYROIDECTOMY       Family History   Problem Relation Age of Onset    Diabetes Mother     Heart Disease Mother     Diabetes Father     Cancer Sister     Heart Disease Maternal Aunt     Cancer Paternal Aunt      Social History  Tobacco Use   Smoking Status Every Day    Packs/day: 0.25    Types: Cigarettes   Smokeless Tobacco Never          Review of Systems,  additional:  Constitutional: negative  Eyes: negative  Respiratory: positive for dyspnea on exertion  Cardiovascular: positive for chest pain, palpitations, dyspnea on exertion  Gastrointestinal: negative  Musculoskeletal:negative  Neurological: negative  Behvioral/Psych: negative  Endocrine: negative  ENT: negative    Objective:     Visit Vitals  BP 160/95 recheck   Pulse 114   Temp 97.8 F (36.6 C) (Temporal)   Wt 230 pounds   SpO2 100 %   BMI 43.46 kg/m     General:  alert, cooperative, no distress   Chest Wall: inspection normal - no chest wall deformities or tenderness, respiratory effort normal   Lung: clear to auscultation bilaterally   Heart:  normal rate and regular rhythm, S1 and S2 normal, no murmurs noted, no gallops noted, no JVD   Abdomen: soft, non-tender. Bowel sounds normal. No masses,  no organomegaly   Extremities: extremities normal, atraumatic, no cyanosis or edema Skin: no rashes   Neuro: alert, oriented, normal speech, no focal findings or movement disorder noted     EKG: 12/09/2021.  Sinus rhythm.  Normal ECG.    Assessment/Plan:       ICD-10-CM ICD-9-CM    1. Primary hypertension, elevated BP in the office today.  Asymptomatic. Started metoprolol 100 mg twice daily at prior visit.  She stopped the metoprolol because of  leg swelling.  The swelling improved.  She was prescribed clonidine 0.3 mg twice daily and takes 1 at night.  .  Will increase atenolol to 75 mg daily.  Return in 6 weeks I10 401.9 ECHO ADULT COMPLETE      TSH AND FREE T4   2. DOE (dyspnea on exertion)  R06.00 786.09 ECHO ADULT COMPLETE      TSH AND FREE T4      CARDIAC HOLTER MONITOR      AMB POC EKG ROUTINE W/ 12 LEADS, INTER & REP   3. Screening for thyroid disorder , order TSH Z13.29 V77.0 TSH AND FREE T4   4. Hyperlipidemia with target low density lipoprotein (LDL) cholesterol less than 70 mg/dL, 03/16/2021.. Total cholesterol 272 HDL 56.  Triglycerides 160.  LDL 184 patient on Lipitor 20 mg daily. E78.5 272.4    5.  Diabetes mellitus due to underlying condition with hyperosmolarity without coma, unspecified whether long term insulin use (HCC) we will order Mounjaro 2.5 mg weekly. E08.00 249.20    6. History of nuclear stress test , 07/02/2018, low risk. Z92.89 V15.89    7. Chest pain, unspecified type , still having occasional palpitations. R07.9 786.50    8. Palpitations , recent increase in symptoms, echocardiogram completed on 12/09/2021.  Ejection fraction was 45 to 50%.  Mildly increased wall thickness.  Mild concentric LVH. R00.2 785.1    9. Tobacco abuse , continue Wellbutrin 150 mg twice daily. Z72.0 305.1    10     Severe obesity, Mounjaro

## 2022-02-25 MED ORDER — SEMAGLUTIDE (2 MG/DOSE) 8 MG/3ML SC SOPN
8 MG/3ML | SUBCUTANEOUS | 11 refills | Status: DC
Start: 2022-02-25 — End: 2022-03-23

## 2022-03-06 NOTE — Telephone Encounter (Signed)
PCP: Reyes Ivan, DO    Last appt: @[email or web address] @  Future Appointments   Date Time Provider Department Center   03/22/2022  2:40 PM 03/24/2022, MD [facility] AMB   03/23/2022  3:15 PM Kirstin H Negron, DO [facility] AMB       Requested Prescriptions     Pending Prescriptions Disp Refills    olmesartan (BENICAR) 40 MG tablet [Pharmacy Med Name: OLMESARTAN MEDOXOMIL 40 MG TAB] 30 tablet 2     Sig: TAKE 1 TABLET BY MOUTH EVERY DAY

## 2022-03-07 MED ORDER — OLMESARTAN MEDOXOMIL 40 MG PO TABS
40 MG | ORAL_TABLET | ORAL | 3 refills | Status: AC
Start: 2022-03-07 — End: 2022-12-12

## 2022-03-07 NOTE — Telephone Encounter (Signed)
Last seen: 02/17/22    Last filled: 09/15/21    tiZANidine (ZANAFLEX) 4 mg tablet 30 Tablet 1       Future Appointments   Date Time Provider [place]   03/22/2022  2:40 PM Gypsy Decant, MD [facility] AMB   03/23/2022  3:15 PM Kirstin H Negron, DO [facility] AMB

## 2022-03-08 MED ORDER — TIZANIDINE HCL 4 MG PO TABS
4 MG | ORAL_TABLET | ORAL | 1 refills | Status: DC
Start: 2022-03-08 — End: 2022-05-24

## 2022-03-22 ENCOUNTER — Ambulatory Visit
Admit: 2022-03-22 | Discharge: 2022-03-22 | Payer: PRIVATE HEALTH INSURANCE | Attending: Cardiovascular Disease | Primary: Family Medicine

## 2022-03-22 DIAGNOSIS — R0600 Dyspnea, unspecified: Secondary | ICD-10-CM

## 2022-03-22 NOTE — Progress Notes (Signed)
Identified pt with two pt identifiers(name and DOB). Reviewed record in preparation for visit and have obtained necessary documentation.    Christy [name] presents today for   Chief Complaint   Patient presents with    Follow-up     6 week        Pt c/o chest pain, Palpitations and swelling.            Christy [name] preferred language for health care discussion is english/other.    Precautions:   Patient currently on None  Patient currently roomed with door closed.    Is someone accompanying this pt? No     Is the patient using any DME equipment during OV? No     Depression Screening:  completed      Abuse Screening:  completed    Pt currently taking Anticoagulant therapy? No   Pt currently taking Antiplatelet therapy? yes    Coordination of Care:  1. Have you been to the ER, urgent care clinic since your last visit? Hospitalized since your last visit? No     2. Have you seen or consulted any other health care providers outside of the [facility] since your last visit? Include any pap smears or colon screening. No

## 2022-03-22 NOTE — Patient Instructions (Addendum)
New Medication/Medication Changes  Take atenolol 50 mg twice daily       **please allow 24-48 hrs for medication to be escribed to pharmacy** If you need any refills on medications please contact your pharmacy so that the request can be escribed to the provider for review seven to 10 days prior to being out of medication.

## 2022-03-23 ENCOUNTER — Ambulatory Visit
Admit: 2022-03-23 | Discharge: 2022-03-23 | Payer: PRIVATE HEALTH INSURANCE | Attending: Family Medicine | Primary: Family Medicine

## 2022-03-23 ENCOUNTER — Inpatient Hospital Stay: Admit: 2022-03-23 | Payer: PRIVATE HEALTH INSURANCE | Primary: Family Medicine

## 2022-03-23 DIAGNOSIS — N939 Abnormal uterine and vaginal bleeding, unspecified: Secondary | ICD-10-CM

## 2022-03-23 LAB — URINALYSIS WITH MICROSCOPIC
Bilirubin Urine: NEGATIVE
Blood, Urine: NEGATIVE
Glucose, UA: >1000 mg/dL — AB
Ketones, Urine: 15 mg/dL — AB
Leukocyte Esterase, Urine: NEGATIVE
Nitrite, Urine: NEGATIVE
Protein, UA: NEGATIVE mg/dL
RBC, UA: 1 /hpf (ref 0–5)
Specific Gravity, UA: >1.03 — ABNORMAL HIGH (ref 1.003–1.030)
Urobilinogen, Urine: 0.2 EU/dL (ref 0.2–1.0)
WBC, UA: 1 /hpf (ref 0–4)
pH, Urine: 5 (ref 5.0–8.0)

## 2022-03-23 MED ORDER — HYDROXYZINE PAMOATE 50 MG PO CAPS
50 MG | ORAL_CAPSULE | Freq: Three times a day (TID) | ORAL | 1 refills | Status: DC | PRN
Start: 2022-03-23 — End: 2022-06-12

## 2022-03-23 MED ORDER — DULAGLUTIDE 4.5 MG/0.5ML SC SOPN
4.5 MG/0.5ML | SUBCUTANEOUS | 11 refills | Status: AC
Start: 2022-03-23 — End: 2022-12-12

## 2022-03-23 NOTE — Progress Notes (Signed)
Christy [name] is a 46 y.o. female (DOB: [birth year]) presenting to address:    Chief Complaint   Patient presents with    Medication Check       Vitals:    03/23/22 1420   BP: (!) 175/131   Pulse: (!) 103   Temp: 98 F (36.7 C)   SpO2: 100%       Coordination of Care Questionaire:   1. "Have you been to the ER, urgent care clinic since your last visit?  Hospitalized since your last visit?" Yes 03/09/22 patient first, throwing up    2. "Have you seen or consulted any other health care providers outside of the [facility] since your last visit?" No     3. For patients aged 26-75: Has the patient had a colonoscopy / FIT/ Cologuard? Yes - no Care Gap present      If the patient is female:    4. For patients aged 55-74: Has the patient had a mammogram within the past 2 years? Yes - no Care Gap present      5. For patients aged 21-65: Has the patient had a pap smear? NA - based on age or sex    Advanced Directive:   1. Do you have an Advanced Directive? No    2. Would you like information on Advanced Directives? No

## 2022-03-23 NOTE — Progress Notes (Signed)
Christy [name] (DOB: [birth year]) is a 46 y.o. female, ESTABLISHED patient, here for FOLLOW UP:    ICD-10-CM    1. Abnormal vaginal bleeding  N93.9 [place] NON OB TRANSVAGINAL     Urinalysis with Microscopic     Urinalysis with Microscopic     Lipid Panel     TSH + Free T4 Panel     Comprehensive Metabolic Panel     CBC with Auto Differential     Vitamin B12      2. Type 2 diabetes mellitus without complication, without long-term current use of insulin (HCC)  E11.9 Dulaglutide 4.5 MG/0.5ML SOPN     Lipid Panel     TSH + Free T4 Panel     Comprehensive Metabolic Panel     CBC with Auto Differential     Vitamin B12      3. S/P hysterectomy  Z90.710 [place] NON OB TRANSVAGINAL     Urinalysis with Microscopic     Urinalysis with Microscopic     Lipid Panel     TSH + Free T4 Panel     Comprehensive Metabolic Panel     CBC with Auto Differential     Vitamin B12      4. Postmenopausal bleeding  N95.0 [place] NON OB TRANSVAGINAL     Urinalysis with Microscopic     Urinalysis with Microscopic     Lipid Panel     TSH + Free T4 Panel     Comprehensive Metabolic Panel     CBC with Auto Differential     Vitamin B12      5. Breast tenderness  N64.4 Lipid Panel     TSH + Free T4 Panel     Comprehensive Metabolic Panel     CBC with Auto Differential     Vitamin B12      6. Pelvic cramping  R10.2 Lipid Panel     TSH + Free T4 Panel     Comprehensive Metabolic Panel     CBC with Auto Differential     Vitamin B12      7. Nausea and vomiting, unspecified vomiting type  R11.2 Lipid Panel     TSH + Free T4 Panel     Comprehensive Metabolic Panel     CBC with Auto Differential     Vitamin B12      8. Routine adult health maintenance  Z00.00 Lipid Panel     TSH + Free T4 Panel     Comprehensive Metabolic Panel     CBC with Auto Differential     Vitamin B12      9. Migraine with aura and without status migrainosus, not intractable  G43.109 Lipid Panel     TSH + Free T4 Panel     Comprehensive Metabolic Panel     CBC with Auto Differential     Vitamin B12       10. Morbid (severe) obesity due to excess calories (HCC)  E66.01 Lipid Panel     TSH + Free T4 Panel     Comprehensive Metabolic Panel     CBC with Auto Differential     Vitamin B12      11. HTN, goal below 130/80  I10 Dulaglutide 4.5 MG/0.5ML SOPN     Lipid Panel     TSH + Free T4 Panel     Comprehensive Metabolic Panel     CBC with Auto Differential     Vitamin B12  12. Dyslipidemia, goal LDL below 70  E78.5 Lipid Panel     TSH + Free T4 Panel     Comprehensive Metabolic Panel     CBC with Auto Differential     Vitamin B12        Assessment   Plan   #Abnormal vaginal bleeding postmenopause   #S/p hysterectomy   #with irregular hormonal symptoms (breast tenderness, pelvic cramping, nausea, vomiting)  Plan to eval U/A and transvaginal U/S    #Chronic mid back pain 2/2 Spinal stenosis x Years - Uncontrolled  Patient reports previously seen by PT, Ortho, Spinal surgeon, Pain management. Reports had injections before for back without any relief.   Rec review Lumbar spine 12/11/18 - No spondylolisthesis. No compression deformity. Mild endplate spondylosis anteriorly, stable. No significant loss of discal height. Facets are intact. No spondylolysis.   Rec review Thoracic spine 09/18/18 - Mild multilevel spinal degenerative changes.   Last MRI >1year ago   Previously tried Flexeril, Gabapentin up to 1200mg  BID without notable benefit.   Current pain regiment: Tizanidine and Lyrica to help manage symptoms.      #NIDDM2 - Goal A1C <7  Hypoglycemia? Denies  Limited improvement despite good compliance with Victoza; Insurance denied [employer] medications without notable AE, will continue regimen unchanged    Hemoglobin A1C, POC   Date Value Ref Range Status   02/17/2022 8.0 % Final     Key Antihyperglycemic Medications            Dulaglutide 4.5 MG/0.5ML SOPN (Taking)    Sig - Route: Inject 4.5 mg into the skin once a week Indications: Diabetes - SubCUTAneous    FARXIGA 10 MG tablet (Taking)    Sig: TAKE 1  TABLET BY MOUTH EVERY DAY    Notes to Pharmacy: DX Code Needed  .    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 Yes, reports home average at goal  Reviewed goals of BP and encouraged to monitor and if blood pressure measurements at home are close to or higher than 140/90 frequently then we should make adjustments to medication regimen. Reviewed risk of stroke or cardiovascular changes with persistently elevated BP.    Tolerating medications without notable AE; Future consideration: addition of diuretic (olmesartan-hctz combo?)  Established with Cardio - rec review last visit   Rec review Echo 12/09/21 - Mildly reduced left ventricular systolic function with a visually estimated EF of 45 - 50%. Mildly increased wall thickness. Findings consistent with mild concentric hypertrophy. Mildly dilated ascending aorta.     BP Readings from Last 3 Encounters:   03/23/22 (!) 175/131   03/22/22 (!) 148/110   02/17/22 (!) 146/100     #HLD - Goal < 70  Tolerating medications without notable AE, will continue regimen unchanged               Lab Results   Component Value Date/Time     LDL-CHOLESTEROL 89 10/22/2019 12:00 AM     LDL, calculated 184 (H) 03/16/2021 09:04 AM      The 10-year ASCVD risk score Denman George DC Jr., et al., 2013) is: 28.1%    Values used to calculate  the score:      Age: 30 years      Sex: Female      Is Non-Hispanic African American: Yes      Diabetic: Yes      Tobacco smoker: Yes      Systolic Blood Pressure: 154 mmHg      Is BP treated: Yes      HDL Cholesterol: 56 MG/DL      Total Cholesterol: 272 MG/DL     Key CAD CHF Meds            olmesartan (BENICAR) 40 MG tablet (Taking)    Sig: TAKE 1 TABLET BY MOUTH EVERY DAY    Renewals       Renewal provider:  Aggie Cosier, MD            atenolol (TENORMIN) 50 MG tablet (Taking)    Sig - Route: Take 1.5 tablets by mouth daily - Oral    Patient taking differently: Take 1 tablet by mouth in the morning and at bedtime    Notes to Pharmacy: Dose increase    atorvastatin (LIPITOR) 40 MG tablet (Taking)    Class: Historical Med    cloNIDine (CATAPRES) 0.3 MG tablet (Taking)    Class: Historical Med          #Acquired Hypothyroidism  Thyroid antibodies? Negative May 2022  Thyroid U/S? Never done  Tolerating medications without notable AE, will continue regimen unchanged     Lab Results   Component Value Date    TSH 3.25 06/08/2021    T4FREE 1.1 06/08/2021     #Migraines  - uncontrolled, having 3-4 HA weekly  Previously saw Neuro for management  Previously used injection CGRP inhibitors (great relief), nasal triptan, PO triptan, Topamax  Reviewed risk/benefits of preventative vs abortive therapy for migraine treatment and given debilitation and interested to see coverage for restarting CGRP therapy      #GERD - Reviewed behavioral and dietary changes to reduce GERD symptoms. Handout provided.   #Hx of IBS  #S/p cholecystectomy 2009  Suspect #constipation worsened by #dehydration contributing to above  Newly established with GI and started on Linzess. Rec review last visit 06/27/21  Last EGD/Colonoscopy 2022  Rec review CT Abd/Pelv 03/30/21 with moderate stool burden, mild hepatic steatosis  Reviewed can consider increase in dose of Linzess if limited improvement on current dosing.      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             Lab Results   Component Value Date/Time     WBC 7.7 03/16/2021 09:04 AM     HGB 10.8 (L) 03/16/2021 09:04 AM     HCT 33.6 (L) 03/16/2021 09:04 AM     PLATELET 232 03/16/2021 09:04 AM     MCV 87.7 03/16/2021 09:04 AM                Lab Results   Component Value Date/Time     Ferritin 114 03/24/2021 09:51 AM      #Vit B12  Def - reviewed dx and recommended supplement dosing        Lab Results   Component Value Date/Time     Vitamin B12 202 (L) 03/16/2021 09:04 AM      #Vit D Def - reviewed dx and recommended supplement dosing        Lab Results   Component Value Date/Time  Vitamin D 25-Hydroxy 18.1 (L) 03/16/2021 09:04 AM                 #Body mass index is 43.27 kg/m.   Counseled on diet and exercise methods. Positive reinforcement provided.   Interested in referral to Bariatrics, ordered 07/13/21  Wt Readings from Last 3 Encounters:   03/23/22 216 lb (98 kg)   03/22/22 215 lb (97.5 kg)   02/17/22 222 lb 12.8 oz (101.1 kg)             Orders Placed This Encounter   Procedures    [place] NON OB TRANSVAGINAL     Compare to CT ab/pelv  June 2022  Uterus is absent. Left ovarian tissue 3.1 x 2.4 cm. Right ovarian tissue 2.9 x 2.1 cm. Bladder nearly empty and not well assessed.     Standing Status:   Future     Standing Expiration Date:   03/24/2023     Scheduling Instructions:      [facility]     Order Specific Question:   Reason for exam:     Answer:   postmenopausal vaginal bleeding, with irregular hormonal symptoms (breast tenderness, pelvic cramping, nausea, vomiting)    Urinalysis with Microscopic     Standing Status:   Future     Number of Occurrences:   1     Standing Expiration Date:   03/24/2023    Lipid Panel     Standing Status:   Future     Standing Expiration Date:   03/24/2023    TSH + Free T4 Panel     Standing Status:   Future     Standing Expiration Date:   03/24/2023    Comprehensive Metabolic Panel     Standing Status:   Future     Standing Expiration Date:   03/24/2023    CBC with Auto Differential     Standing Status:   Future     Standing Expiration Date:   03/24/2023    Vitamin B12     Standing Status:   Future     Standing Expiration Date:   03/24/2023     Return in about 3 months (around 06/23/2022) for , follow up with PCP.      Subjective   See A/P     Current Outpatient Medications   Medication Instructions    aspirin  81 mg    atenolol (TENORMIN) 75 mg, Oral, DAILY    atorvastatin (LIPITOR) 40 mg, Oral    buPROPion (WELLBUTRIN XL) 150 mg, Oral, 2 times daily    cloNIDine (CATAPRES) 0.3 mg, Oral, 2 TIMES DAILY    Dulaglutide 4.5 mg, SubCUTAneous, WEEKLY    FARXIGA 10 MG tablet TAKE 1 TABLET BY MOUTH EVERY DAY    gabapentin (NEURONTIN) 300 mg, Oral, 3 TIMES DAILY    hydrOXYzine pamoate (VISTARIL) 50 mg, Oral, 3 TIMES DAILY PRN    Lancets MISC Use to test blood sugar once daily    levothyroxine (SYNTHROID) 200 mcg, Oral, DAILY BEFORE BREAKFAST    linaclotide (LINZESS) 145 mcg, Oral    lurasidone (LATUDA) 40 MG TABS tablet 1 tablet, Oral    metFORMIN (GLUCOPHAGE) 1,000 mg, Oral, 2 TIMES DAILY WITH MEALS    methocarbamol (ROBAXIN) 750 mg, Oral, 4 TIMES DAILY PRN    nitroGLYCERIN (NITROSTAT) 0.4 mg, SubLINGual    olmesartan (BENICAR) 40 MG tablet TAKE 1 TABLET BY MOUTH EVERY DAY    pantoprazole (PROTONIX) 40 mg, Oral, 2 TIMES DAILY  pregabalin (LYRICA) 100 mg, Oral, 2 TIMES DAILY, Nerve pain    Rimegepant Sulfate 75 mg, Oral, EVERY OTHER DAY    SITagliptin (JANUVIA) 100 mg, Oral, DAILY    tiZANidine (ZANAFLEX) 4 MG tablet TAKE 1 TABLET BY MOUTH TWICE A DAY AS NEEDED FOR MUSCLE SPASM. MAY TAKE 1/2 TAB/OR ONLY AT NIGHT    traZODone (DESYREL) 50 MG tablet TAKE 1 TABLET BY MOUTH AT BEDTIME AS NEEDED FOR SLEEP      Objective    height is 5\' 1"  (1.549 m) and weight is 216 lb (98 kg). Her temporal temperature is 98 F (36.7 C). Her blood pressure is 175/131 (abnormal) and her pulse is 103 (abnormal). Her oxygen saturation is 100%.   Physical Exam  Vitals and nursing note reviewed.   Constitutional:       General: She is not in acute distress.  Cardiovascular:      Rate and Rhythm: Tachycardia present.   Pulmonary:      Effort: Pulmonary effort is normal. No respiratory distress.   Neurological:      Mental Status: She is alert and oriented to person, place, and time.      Gait: Gait normal.   Psychiatric:         Mood and Affect: Mood  normal.         Behavior: Behavior normal.          Reyes Ivan, DO  Family Medicine  03/23/2022

## 2022-03-23 NOTE — Patient Instructions (Addendum)
Call [phone] to schedule your pelvic ultrasound imaging with [facility].       Wt Readings from Last 3 Encounters:   03/23/22 216 lb (98 kg)   03/22/22 215 lb (97.5 kg)   02/17/22 222 lb 12.8 oz (101.1 kg)     Over the Counter Oral Pain Med Options:  First choice: You can use Tylenol (Acetaminophen) Extra Strength/Arthritis up to 1000mg  three times daily as needed for pain.  Back up choice:  The safest over the counter NSAID to use would be Aleve (also known as Naproxen.) You can take up to 500mg  twice daily as needed for pain. This med is best tolerated when taken with food.   Alternative NSAID for those with GERD (heartburn, reflux), history of ulcers would be Ibuprofen due to lowest documented adverse GI effects. You can take up to 800mg  three times daily as needed for pain.  Caution with long term regular use of any NSAID (Ex. Advil, Aleve, Ibuprofen.) They can put strain on your kidney, be upsetting to the stomach, as well as contribute to increased blood pressure.  Tylenol and NSAIDs work in different ways and can be safely used together if needed on occasion.    Over the Counter Topical Pain Med Options:  Can purchase Aspercreme or Volteran gel 1% (also known as Diclofenac) at the pharmacy without a prescription. It is the same kind of medication as Ibuprofen (NSAID) which works well for inflammation without the side effects that come with oral version of the medication. It comes in a tube, usually 100g and can be used as needed for muscular or joint pain.   Can also try patches that contain antiinflammatory medicine, such as Salonpas patches that are made of thin, stretchable fabric and contain three active ingredients that work together as a topical pain treatment: camphor )3.1%(?, menthol )6%(???????????? and methyl salicylate (10%.) A single patch is reportedly effective for 8 to 12 hours.

## 2022-03-27 MED ORDER — ATENOLOL 50 MG PO TABS
50 MG | ORAL_TABLET | Freq: Two times a day (BID) | ORAL | 6 refills | Status: AC
Start: 2022-03-27 — End: 2022-11-27

## 2022-03-27 NOTE — Progress Notes (Signed)
Subjective:      Christy [name] is in the office today for cardiac reevaluation. She is a 46 year old woman that has been told she had enlarged heart in the past. Recent CT CTA of the chest demonstrated a normal-sized heart.  She had a normal nuclear stress test done on 07/02/2018.    The patient reported a several year history of chest pain. It was described as "stabbing "in quality. It was also described as sometimes dull. It could last up to 5 minutes. It generally went away without intervention. She would took an occasional aspirin. She localized the discomfort to the left peristernal and inframammary areas. The patient also experienced shortness of breath. She said that one flight of steps would cause her to be very short of breath. She had no PND or orthopnea.    In the office on 11/26/2020, she reported that she was feeling "okay ".  She reported a recent ED visit.  She  presented with fatigue, malaise and nausea.  She tested negative for Covid.    At a prior appointment, metoprolol was added to her regimen.  She felt that it was making her lower extremities swell.  She was given clonidine which she is taking only at night.  She remains on the Benicar.    She had an  echocardiogram earlier this year.  It was completed on 12/09/2021.  Her ejection fraction was slightly reduced at 45 to 50%.  There was mild concentric LVH.  She is trying to lose weight but having a lot of difficulty.    In the office today she reports some lower extremity swelling and palpitations.  She reports she is feeling "okay ".  She has atypical chest pain "every so often ".     Patient's cardiac risk factors are smoking/ tobacco exposure, family history, dyslipidemia, diabetes mellitus, hypertension.        Patient Active Problem List    Diagnosis Date Noted    History of nuclear stress test 09/18/2020    Palpitations 09/18/2020    Tobacco abuse 09/18/2020    Family history of premature CAD 09/18/2020    Obesity, morbid ([place]) 10/22/2019    Spinal  stenosis     IBS (irritable bowel syndrome)     Chest pain 12/10/2012    Dizziness 12/10/2012    CAD (coronary artery disease) 06/06/2011    Hyperlipidemia with target low density lipoprotein (LDL) cholesterol less than 70 mg/dL 06/06/2011    Hypertension 06/06/2011    Obesity 06/06/2011    DM (diabetes mellitus) ([place]) 06/06/2011    GERD (gastroesophageal reflux disease) 06/06/2011     Current Outpatient Medications   Medication Sig Dispense Refill    olmesartan (BENICAR) 40 mg tablet TAKE 1 TABLET BY MOUTH EVERY DAY 30 Tablet 5    dapagliflozin (FARXIGA) 10 mg tab tablet Take 1 Tablet by mouth daily. 90 Tablet 1    atorvastatin (Lipitor) 40 mg tablet Take 1 Tablet by mouth nightly for 90 days. Indications: high cholesterol 90 Tablet 1    docusate sodium (COLACE) 100 mg capsule Take 1 Capsule by mouth two (2) times a day for 90 days. 60 Capsule 2    traZODone (DESYREL) 50 mg tablet TAKE 1 TABLET BY MOUTH AT BEDTIME AS NEEDED FOR SLEEP      hydrOXYzine pamoate (VISTARIL) 50 mg capsule TAKE 1 CAPSULE BY MOUTH 4 TIMES A DAY AS NEEDED FOR ANXIETY      glucose blood VI test strips (True Metrix Glucose Test Strip)  strip Use to test blood sugar once daily 100 Strip 11    lancets misc Use to test blood sugar once daily 1 Each 11    Blood-Glucose Meter (True Metrix Glucose Meter) misc 1 Kit by Does Not Apply route daily. 1 Each 0    Insulin Needles, Disposable, 31 gauge x 5/16" ndle Use twice daily for insulin and victoza injections. 100 Each 11    SITagliptin (Januvia) 100 mg tablet Take 1 Tablet by mouth daily. Indications: type 2 diabetes mellitus 90 Tablet 2    Latuda 40 mg tab tablet Take 1 Tablet by mouth nightly.      PARoxetine (PAXIL) 30 mg tablet Take 30 mg by mouth nightly.      liraglutide (Victoza 3-Pak) 0.6 mg/0.1 mL (18 mg/3 mL) pnij ADMINISTER 1.8MG SUBCUTANEOUSLY ONCE A DAY  Indications: type 2 diabetes mellitus 9 Adjustable Dose Pre-filled Pen Syringe 0    methocarbamoL (ROBAXIN) 750 mg tablet Take 1 Tablet  by mouth four (4) times daily as needed for Muscle Spasm(s) or Pain. 50 Tablet 0    levothyroxine (SYNTHROID) 200 mcg tablet Take 1 Tablet by mouth Daily (before breakfast). 30 Tablet 2    buPROPion (WELLBUTRIN) 75 mg tablet Take 1 Tablet by mouth two (2) times a day. 60 Tablet 5    cloNIDine HCL (CATAPRES) 0.3 mg tablet Take 1 Tablet by mouth two (2) times a day. 180 Tablet 1    insulin glargine (Lantus Solostar U-100 Insulin) 100 unit/mL (3 mL) inpn Inject 10 units subcutaneously every night at bedtime. 45 mL 3    aspirin delayed-release 81 mg tablet 81 mg.      nitroglycerin (NITROSTAT) 0.4 mg SL tablet 1 Tablet by SubLINGual route every five (5) minutes as needed for Chest Pain. Up to 3 doses. 25 Tablet 3    metFORMIN (GLUCOPHAGE) 1,000 mg tablet Take 1 Tablet by mouth two (2) times daily (with meals) for 90 days. 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    Enlarged heart     Hypertension     IBS (irritable bowel syndrome)     Interstitial cystitis     Sciatica     Spinal stenosis      Past Surgical History:   Procedure Laterality Date    HX CHOLECYSTECTOMY      2008    HX ORTHOPAEDIC      LEFT KNEE    HX ORTHOPAEDIC      LEFT ARM    HX PARTIAL HYSTERECTOMY  2003    STILL HAVE OVARIES    HX PELVIC LAPAROSCOPY      HX THYROIDECTOMY       Family History   Problem Relation Age of Onset    Diabetes Mother     Heart Disease Mother     Diabetes Father     Cancer Sister     Heart Disease Maternal Aunt  Cancer Paternal Aunt      Social History     Tobacco Use   Smoking Status Every Day    Packs/day: 0.25    Types: Cigarettes    Smokeless Tobacco Never          Review of Systems, additional:  Constitutional: negative  Eyes: negative  Respiratory: positive for dyspnea on exertion  Cardiovascular: positive for chest pain, palpitations, dyspnea on exertion  Gastrointestinal: negative  Musculoskeletal:negative  Neurological: negative  Behvioral/Psych: negative  Endocrine: negative  ENT: negative    Objective:     Visit Vitals  BP 160/95 recheck   Pulse 114   Temp 97.8 F (36.6 C) (Temporal)   Wt 230 pounds   SpO2 100 %   BMI 43.46 kg/m     General:  alert, cooperative, no distress   Chest Wall: inspection normal - no chest wall deformities or tenderness, respiratory effort normal   Lung: clear to auscultation bilaterally   Heart:  normal rate and regular rhythm, S1 and S2 normal, no murmurs noted, no gallops noted, no JVD   Abdomen: soft, non-tender. Bowel sounds normal. No masses,  no organomegaly   Extremities: extremities normal, atraumatic, no cyanosis or edema Skin: no rashes   Neuro: alert, oriented, normal speech, no focal findings or movement disorder noted     EKG: 12/09/2021.  Sinus rhythm.  Normal ECG.    Assessment/Plan:       ICD-10-CM ICD-9-CM    1. Primary hypertension, elevated BP in the office today.  Asymptomatic. Started metoprolol 100 mg twice daily at prior visit.  She stopped the metoprolol because of  leg swelling.  The swelling improved.  She was prescribed clonidine 0.3 mg twice daily and takes 1 at night.  .  Increased the  atenolol to 75 mg daily at prior visit.  Will change the dosing of the metoprolol to 50 mg twice daily.  Return in 5 weeks I10 401.9 ECHO ADULT COMPLETE      TSH AND FREE T4   2. DOE (dyspnea on exertion)  R06.00 786.09 ECHO ADULT COMPLETE      TSH AND FREE T4      CARDIAC HOLTER MONITOR      AMB POC EKG ROUTINE W/ 12 LEADS, INTER & REP   3. Screening for thyroid disorder , order TSH Z13.29 V77.0 TSH AND FREE T4   4. Hyperlipidemia with target low density lipoprotein (LDL) cholesterol less than  70 mg/dL, 03/16/2021.. Total cholesterol 272 HDL 56.  Triglycerides 160.  LDL 184 patient on Lipitor 20 mg daily. E78.5 272.4    5. Diabetes mellitus due to underlying condition with hyperosmolarity without coma, unspecified whether long term insulin use (HCC) patient taking dulaglutide and semaglutide E08.00 249.20    6. History of nuclear stress test , 07/02/2018, low risk. Z92.89 V15.89    7. Chest pain, unspecified type , still having occasional palpitations. R07.9 786.50    8. Palpitations , recent increase in symptoms, echocardiogram completed on 12/09/2021.  Ejection fraction was 45 to 50%.  Mildly increased wall thickness.  Mild concentric LVH.  Changing atenolol dosage as noted above R00.2 785.1    9. Tobacco abuse , continue Wellbutrin 150 mg twice daily. Z72.0 305.1    10     Severe obesity

## 2022-04-18 ENCOUNTER — Encounter

## 2022-04-19 MED ORDER — ATORVASTATIN CALCIUM 40 MG PO TABS
40 MG | ORAL_TABLET | ORAL | 3 refills | Status: AC
Start: 2022-04-19 — End: 2022-12-12

## 2022-04-19 NOTE — Telephone Encounter (Signed)
Last seen: 03/23/22    Last filled: 03/24/21      atorvastatin (Lipitor) 40 mg tablet 90 Tablet 1       Future Appointments   Date Time Provider Department Center   05/03/2022  2:20 PM Aggie Cosier, MD [facility]

## 2022-05-03 ENCOUNTER — Ambulatory Visit
Admit: 2022-05-03 | Discharge: 2022-05-03 | Payer: PRIVATE HEALTH INSURANCE | Attending: Cardiovascular Disease | Primary: Family Medicine

## 2022-05-03 DIAGNOSIS — R000 Tachycardia, unspecified: Secondary | ICD-10-CM

## 2022-05-03 NOTE — Progress Notes (Unsigned)
Christy [name] presents today for   Chief Complaint   Patient presents with    Follow-up     5 week follow up       Christy [name] preferred language for health care discussion is english/other.    Is someone accompanying this pt? no    Is the patient using any DME equipment during OV? no    Depression Screening:  Depression: Not at risk    PHQ-2 Score: 0        Learning Assessment:  Who is the primary learner? Patient    What is the preferred language for health care of the primary learner? ENGLISH    How does the primary learner prefer to learn new concepts? DEMONSTRATION    Answered By patient    Relationship to Learner SELF           Pt currently taking Anticoagulant therapy? no    Pt currently taking Antiplatelet therapy ? ASA 81 mg once a day      Coordination of Care:  1. Have you been to the ER, urgent care clinic since your last visit? Hospitalized since your last visit? no    2. Have you seen or consulted any other health care providers outside of the [facility] since your last visit? Include any pap smears or colon screening. no

## 2022-05-17 NOTE — ED Notes (Signed)
Formatting of this note might be different from the original.  Discharged by provider.   Electronically signed by Ander Slade, LPN at 31/54/0086  3:46 PM EDT

## 2022-05-17 NOTE — ED Triage Notes (Signed)
Formatting of this note is different from the original.  Pt ambulatory w/ c/o SOB, dizziness, nausea, and abdominal pain x "months"    Past Medical History:   Diagnosis Date    Cardiac disorder     Cystitis, interstitial     Diabetes mellitus (HCC)     Diabetic neuropathy (HCC)     Hypertension      Electronically signed by Reine Just, RN at 05/17/2022 12:17 PM EDT

## 2022-05-17 NOTE — ED Provider Notes (Signed)
Formatting of this note is different from the original.    [place]    Time of Arrival:   05/17/22 1203    Final diagnoses:   [R10.9] Abdominal pain, unspecified abdominal location (Primary)   [R42] Lightheaded   [I10] Essential hypertension     Medical Decision Making:      Social Determinants of Health:  social factors reviewed, did not limit treatment                               Supplemental Historians include:  patient    ED Course:   46yo female presents for 1 month of ongoing intermittent nausea lightheadedness and abdominal discomfort that occurs primarily in the morning.  On arrival she is notably hypertensive, tachycardic, otherwise vital signs reassuring.  Patient states that she has had previous work-up for chronic tachycardia and typically does have labile blood pressures.  Examination reassuring, no abdominal tenderness.  Extremities neurovascularly intact    Overall primary concern would be for PE, intra-abdominal process such as cholecystitis, pancreatitis, choledocholithiasis.  We will also screen for ACS, CHF.    Work-up at this time reassuring.  Patient unable provide urine sample, however has no urinary symptoms I think is reasonable discharge without this lab.  Patient feels improved after IV fluids.    ED Course as of 05/18/22 1318   Wed May 17, 2022   1541 Pt notably still quite hypertensive.  States that she is due for her atenolol and clonidine.  She prefers to go take these at home.  I think this is reasonable.  At this time work-up is without significant abnormality.  Will be discharged to follow-up with GI, primary care.  Prescribed Bentyl and Zofran.  She understands her diagnosis plan of care and return precautions [TC]         Documentation/Prior Results Review:  Old medical records, Nursing notes    Rhythm interpretation from monitor: N/A    Imaging Interpreted by me: X-Ray chest x-ray on my review without signs of pneumonia or pneumothorax    CHEST PA AND  LATERAL   Final Result     1. No acute cardiopulmonary findings.     Signed By: Astrid Drafts, MD on 05/17/2022 12:50 PM       EKG 12 LEAD UNIT PERFORMED   Final Result       .     Discussion of Mangement with other Physicians, QHP or Appropriate Source:   None    .    The differential diagnosis and / or critical care lists were considered including infections, sepsis, severe sepsis, and septic shock and found unlikely unless otherwise documented in the final clinical impression or diagnosis list.     Disposition:  Home    Discharge Medication List as of 05/17/2022  3:37 PM       Chief Complaint   Patient presents with    SHORTNESS OF BREATH     HPI   46 year old female past medical history of diabetes, hypertension, CAD, s/p hysterectomy and cholecystectomy, DVT, smoker presents for nausea, lightheadedness, and abdominal discomfort. States this has been intermittent for about 1 month. Has seen her primary care doctor for this and had a pelvic US showing a vaginal cuff mass. Also reporting some intermittent SOB. No inability to ambulate, no numbness, weakness. No vomiting or diarrhea. Reported good PO intake. Reports lightheadedness primarily with change in position. No  syncope. Does see a cardiologist for chronic tachycardia, had a reportedly negative holter monitor.      Counseled on smoking cessation    Review of Systems  Above    Physical Exam  General: AOx4, NAD  HEENT: Pupils equal and round, no scleral icterus. Head normocephalic, atraumatic.  Cardiovascular: Tachycardic rate, regular rhythm. Extremities well perfused  Pulmonary: Lungs clear to auscultation. No increased work of breathing  Abdomen: Soft, nontender. No rebound, no guarding.  No CVA tenderness to percussion  Psych: Cooperative and appropriate  Neuro: CN II-XII intact. Sensation intact in the four extremities. Strength 5/5 at bilateral shoulders, elbows, wrists, hips, knees, ankles. No dysarthria or aphasia. No ataxia in the extremities.  Normal  tandem gait  Skin: Warm and dry, no LE edema.   MSK: Moves all extremities    Past Medical History:   Diagnosis Date    Cardiac disorder     Cystitis, interstitial     Diabetes mellitus ([place])     Diabetic neuropathy ([place])     Hypertension      Past Surgical History:   Procedure Laterality Date    CHOLECYSTECTOMY      HYSTERECTOMY      TUBAL LIGATION       Family History   Problem Relation Age of Onset    Heart Disease Father     Other Family History Father     Diabetes Father     Cancer Father     Heart Disease Mother     Other Family History Mother     Diabetes Mother     Thyroid Disease Mother      Social History     Occupational History    Not on file   Tobacco Use    Smoking status: Every Day     Packs/day: 0.25     Years: 12.00     Pack years: 3.00     Types: Cigarettes    Smokeless tobacco: Never    Tobacco comments:     tobacco cessation counselling done   Vaping Use    Vaping Use: Never used   Substance and Sexual Activity    Alcohol use: Yes     Comment: occasionally    Drug use: No     Comment: denies    Sexual activity: Not on file     Outpatient Medications Marked as Taking for the 05/17/22 encounter [facility] Encounter)   Medication Sig Dispense Refill    dicyclomine (BENTYL) 20 mg PO TABS Take 1 Tab by Mouth 4 Times Daily As Needed (abdominal pain). 30 Tab 0    ondansetron (ZOFRAN) 4 mg PO ODT. Take 1 Tab by Mouth Every 8 Hours As Needed for Nausea (PRN FOR NAUSEA AND VOMITING). ALLOW TABLET TO DISSOLVE ON TONGUE. 12 Tab 0     Allergies   Allergen Reactions    Lisinopril cough    Vicodin [Hydrocodone-Acetaminophen] rash/itching    Penicillins unknown     Yeast infection      Vital Signs:  Patient Vitals for the past 72 hrs:   Temp Heart Rate Resp BP BP Mean SpO2   05/17/22 1454 97.8 F (36.6 C) 112 -- (!) 195/124 148 MM HG 100 %   05/17/22 1227 98.5 F (36.9 C) 121 18 (!) 190/131 151 MM HG 99 %   05/17/22 1217 -- 132 18 -- -- 100 %     Diagnostics:  Labs:    Results for orders placed or performed  during the hospital encounter of 35/00/93   BASIC METABOLIC PANEL   Result Value Ref Range    Potassium 4.0 3.5 - 5.5 mmol/L    Sodium 141 133 - 145 mmol/L    Chloride 104 98 - 110 mmol/L    Glucose 95 70 - 99 mg/dL    Calcium 9.4 8.4 - 10.5 mg/dL    BUN 14 6 - 22 mg/dL    Creatinine 0.7 0.5 - 1.2 mg/dL    CO2 24 20 - 32 mmol/L    eGFR >60.0 >60.0 mL/min/1.73 sq.m.    Anion Gap 13.0 3.0 - 15.0 mmol/L   HEPATIC FUNCTION PANEL   Result Value Ref Range    Albumin 4.3 3.5 - 5.0 g/dL    Total Protein 7.2 6.4 - 8.3 g/dL    Globulin 2.9 2.0 - 4.0 g/dL    A/G Ratio 1.5 1.1 - 2.6 ratio    Bilirubin Total 0.2 0.2 - 1.2 mg/dL    Bilirubin Direct <0.2 0.0 - 0.3 mg/dL    SGOT (AST) 14 10 - 37 U/L    Alkaline Phosphatase 66 25 - 115 U/L    SGPT (ALT) 14 5 - 40 U/L   LIPASE   Result Value Ref Range    Lipase 39 7 - 60 U/L   CBC WITH DIFFERENTIAL AUTO   Result Value Ref Range    WBC 8.9 4.0 - 11.0 K/uL    RBC 4.36 3.80 - 5.20 M/uL    HGB 12.4 11.7 - 16.0 g/dL    HCT 38.0 35.1 - 48.0 %    MCV 87 80 - 99 fL    MCH 28 26 - 34 pg    MCHC 33 31 - 36 g/dL    RDW 13.6 10.0 - 15.5 %    Platelet 255 140 - 440 K/uL    MPV 11.3 9.0 - 13.0 fL    Segmented Neutrophils (Auto) 60 40 - 75 %    Lymphocytes (Auto) 35 20 - 45 %    Monocytes (Auto) 3 3 - 12 %    Eosinophils (Auto) 2 0 - 6 %    Basophils (Auto) 0 0 - 2 %    Absolute Neutrophils (Auto) 5.4 1.8 - 7.7 K/uL    Absolute Lymphocytes (Auto) 3.1 1.0 - 4.8 K/uL    Absolute Monocytes (Auto) 0.3 0.1 - 1.0 K/uL    Absolute Eosinophils (Auto) 0.1 0.0 - 0.5 K/uL    Absolute Basophils (Auto) 0.0 0.0 - 0.2 K/uL   D-DIMER QUANTITATIVE   Result Value Ref Range    D-Dimer 0.32 0.00 - 1.12 mg/L FEU   TROPONIN   Result Value Ref Range    Troponin (T) Quant High Sensitivity (5th Gen) 7 0 - 19 ng/L   NT PROBNP   Result Value Ref Range    NT proBNP 124 <=125 pg/mL     ECG:  Results for orders placed or performed during the hospital encounter of 05/17/22   EKG 12 LEAD UNIT PERFORMED   Result Value Ref Range  Status    Heart Rate 115 bpm Final    RR Interval 520 ms Final    Atrial Rate 115 ms Final    P-R Interval 155 ms Final    P Duration 105 ms Final    P Horizontal Axis 52 deg Final    P Front Axis 45 deg Final    Q Onset 501 ms Final    QRSD Interval 82 ms  Final    QT Interval 334 ms Final    QTcB 463 ms Final    QTcF 415 ms Final    QRS Horizontal Axis -54 deg Final    QRS Axis -2 deg Final    I-40 Front Axis 9 deg Final    t-40 Horizontal Axis -79 deg Final    T-40 Front Axis -7 deg Final    T Horizontal Axis 57 deg Final    T Wave Axis 32 deg Final    S-T Horizontal Axis 67 deg Final    S-T Front Axis 41 deg Final    Impression - OTHERWISE NORMAL ECG -  Final    Impression ST-Sinus tachycardia-rate>99  Final    Impression   Final     -No significant change since prior tracings excepting rate-     Medications ordered/given in the ED  Medications   sodium chloride (normal saline) 0.9% infusion (1,000 mL Intravenous bolus 05/17/22 1348)       Electronically signed by Andres Labrum, MD at 05/18/2022  1:27 PM EDT

## 2022-05-24 NOTE — Telephone Encounter (Signed)
Christy [name] called for their medication refill.    Last Office visit:  03/23/22    Follow up visit:    Future Appointments   Date Time Provider Department Center   06/26/2022  2:40 PM Aggie Cosier, MD [facility] BS AMB

## 2022-05-25 MED ORDER — PAROXETINE HCL 40 MG PO TABS
40 MG | ORAL_TABLET | ORAL | 3 refills | Status: DC
Start: 2022-05-25 — End: 2022-12-12

## 2022-05-25 MED ORDER — CLONIDINE HCL 0.3 MG PO TABS
0.3 MG | ORAL_TABLET | ORAL | 3 refills | Status: AC
Start: 2022-05-25 — End: ?

## 2022-05-25 MED ORDER — TIZANIDINE HCL 4 MG PO TABS
4 MG | ORAL_TABLET | ORAL | 1 refills | Status: DC
Start: 2022-05-25 — End: 2022-12-12

## 2022-05-27 NOTE — Progress Notes (Signed)
Subjective:      Christy [name] is in the office today for cardiac reevaluation. She is a 46 year old woman that has been told she had enlarged heart in the past. Recent CT CTA of the chest demonstrated a normal-sized heart.  She had a normal nuclear stress test done on 07/02/2018.    The patient reported a several year history of chest pain. It was described as "stabbing "in quality. It was also described as sometimes dull. It could last up to 5 minutes. It generally went away without intervention. She would took an occasional aspirin. She localized the discomfort to the left peristernal and inframammary areas. The patient also experienced shortness of breath. She said that one flight of steps would cause her to be very short of breath. She had no PND or orthopnea.    In the office on 11/26/2020, she reported that she was feeling "okay ".  She reported a recent ED visit.  She  presented with fatigue, malaise and nausea.  She tested negative for Covid.    At a prior appointment, metoprolol was added to her regimen.  She felt that it was making her lower extremities swell.  She was given clonidine which she is taking only at night.  She remains on the Benicar.    She had an  echocardiogram earlier this year.  It was completed on 12/09/2021.  Her ejection fraction was slightly reduced at 45 to 50%.  There was mild concentric LVH.      In the office today she reports occasional heart racing.  It does not occur at any particular time.  She experiences the palpitations several times per week.  Patient's cardiac risk factors are smoking/ tobacco exposure, family history, dyslipidemia, diabetes mellitus, hypertension.        Patient Active Problem List    Diagnosis Date Noted    History of nuclear stress test 09/18/2020    Palpitations 09/18/2020    Tobacco abuse 09/18/2020    Family history of premature CAD 09/18/2020    Obesity, morbid ([place]) 10/22/2019    Spinal stenosis     IBS (irritable bowel syndrome)     Chest pain 12/10/2012     Dizziness 12/10/2012    CAD (coronary artery disease) 06/06/2011    Hyperlipidemia with target low density lipoprotein (LDL) cholesterol less than 70 mg/dL 06/06/2011    Hypertension 06/06/2011    Obesity 06/06/2011    DM (diabetes mellitus) ([place]) 06/06/2011    GERD (gastroesophageal reflux disease) 06/06/2011     Current Outpatient Medications   Medication Sig Dispense Refill    olmesartan (BENICAR) 40 mg tablet TAKE 1 TABLET BY MOUTH EVERY DAY 30 Tablet 5    dapagliflozin (FARXIGA) 10 mg tab tablet Take 1 Tablet by mouth daily. 90 Tablet 1    atorvastatin (Lipitor) 40 mg tablet Take 1 Tablet by mouth nightly for 90 days. Indications: high cholesterol 90 Tablet 1    docusate sodium (COLACE) 100 mg capsule Take 1 Capsule by mouth two (2) times a day for 90 days. 60 Capsule 2    traZODone (DESYREL) 50 mg tablet TAKE 1 TABLET BY MOUTH AT BEDTIME AS NEEDED FOR SLEEP      hydrOXYzine pamoate (VISTARIL) 50 mg capsule TAKE 1 CAPSULE BY MOUTH 4 TIMES A DAY AS NEEDED FOR ANXIETY      glucose blood VI test strips (True Metrix Glucose Test Strip) strip Use to test blood sugar once daily 100 Strip 11    lancets misc Use  to test blood sugar once daily 1 Each 11    Blood-Glucose Meter (True Metrix Glucose Meter) misc 1 Kit by Does Not Apply route daily. 1 Each 0    Insulin Needles, Disposable, 31 gauge x 5/16" ndle Use twice daily for insulin and victoza injections. 100 Each 11    SITagliptin (Januvia) 100 mg tablet Take 1 Tablet by mouth daily. Indications: type 2 diabetes mellitus 90 Tablet 2    Latuda 40 mg tab tablet Take 1 Tablet by mouth nightly.      PARoxetine (PAXIL) 30 mg tablet Take 30 mg by mouth nightly.      liraglutide (Victoza 3-Pak) 0.6 mg/0.1 mL (18 mg/3 mL) pnij ADMINISTER 1.8MG SUBCUTANEOUSLY ONCE A DAY  Indications: type 2 diabetes mellitus 9 Adjustable Dose Pre-filled Pen Syringe 0    methocarbamoL (ROBAXIN) 750 mg tablet Take 1 Tablet by mouth four (4) times daily as needed for Muscle Spasm(s) or Pain.  50 Tablet 0    levothyroxine (SYNTHROID) 200 mcg tablet Take 1 Tablet by mouth Daily (before breakfast). 30 Tablet 2    buPROPion (WELLBUTRIN) 75 mg tablet Take 1 Tablet by mouth two (2) times a day. 60 Tablet 5    cloNIDine HCL (CATAPRES) 0.3 mg tablet Take 1 Tablet by mouth two (2) times a day. 180 Tablet 1    insulin glargine (Lantus Solostar U-100 Insulin) 100 unit/mL (3 mL) inpn Inject 10 units subcutaneously every night at bedtime. 45 mL 3    aspirin delayed-release 81 mg tablet 81 mg.      nitroglycerin (NITROSTAT) 0.4 mg SL tablet 1 Tablet by SubLINGual route every five (5) minutes as needed for Chest Pain. Up to 3 doses. 25 Tablet 3    metFORMIN (GLUCOPHAGE) 1,000 mg tablet Take 1 Tablet by mouth two (2) times daily (with meals) for 90 days. 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    Enlarged heart     Hypertension     IBS (irritable bowel syndrome)     Interstitial cystitis     Sciatica     Spinal stenosis      Past Surgical History:   Procedure Laterality Date    HX CHOLECYSTECTOMY      2008    HX ORTHOPAEDIC      LEFT KNEE    HX ORTHOPAEDIC      LEFT ARM    HX PARTIAL HYSTERECTOMY  2003    STILL HAVE OVARIES    HX PELVIC LAPAROSCOPY      HX THYROIDECTOMY       Family History   Problem Relation Age of Onset    Diabetes Mother     Heart Disease Mother     Diabetes Father     Cancer Sister     Heart Disease Maternal Aunt     Cancer Paternal Aunt      Social History     Tobacco Use  Smoking Status Every Day    Packs/day: 0.25    Types: Cigarettes   Smokeless Tobacco Never          Review of Systems,  additional:  Constitutional: negative  Eyes: negative  Respiratory: positive for dyspnea on exertion  Cardiovascular: positive for chest pain, palpitations, dyspnea on exertion  Gastrointestinal: negative  Musculoskeletal:negative  Neurological: negative  Behvioral/Psych: negative  Endocrine: negative  ENT: negative    Objective:     Visit Vitals  BP 160/95 recheck   Pulse 114   Temp 97.8 F (36.6 C) (Temporal)   Wt 230 pounds   SpO2 100 %   BMI 43.46 kg/m     General:  alert, cooperative, no distress   Chest Wall: inspection normal - no chest wall deformities or tenderness, respiratory effort normal   Lung: clear to auscultation bilaterally   Heart:  normal rate and regular rhythm, S1 and S2 normal, no murmurs noted, no gallops noted, no JVD   Abdomen: soft, non-tender. Bowel sounds normal. No masses,  no organomegaly   Extremities: extremities normal, atraumatic, no cyanosis or edema Skin: no rashes   Neuro: alert, oriented, normal speech, no focal findings or movement disorder noted     EKG: 12/09/2021.  Sinus rhythm.  Normal ECG.    Assessment/Plan:       ICD-10-CM ICD-9-CM    1. Primary hypertension, controlled BP in the office today.  Started metoprolol 100 mg twice daily at prior visit.  She stopped the metoprolol because of  leg swelling.  The swelling improved.  She was prescribed clonidine 0.3 mg twice daily and takes 1 at night.  .  Increased the  atenolol to 75 mg daily at prior visit.  She is still having palpitations at times so she was told to take aN extra half tab of the atenolol if she had prolonged palpitations.  Return in 8 weeks I10 401.9 ECHO ADULT COMPLETE      TSH AND FREE T4   2. DOE (dyspnea on exertion)  R06.00 786.09 ECHO ADULT COMPLETE      TSH AND FREE T4      CARDIAC HOLTER MONITOR      AMB POC EKG ROUTINE W/ 12 LEADS, INTER & REP   3. Screening for thyroid disorder , order TSH Z13.29 V77.0 TSH AND FREE T4   4. Hyperlipidemia with target low density lipoprotein (LDL) cholesterol less  than 70 mg/dL, 03/16/2021.. Total cholesterol 272 HDL 56.  Triglycerides 160.  LDL 184 patient on Lipitor 20 mg daily. E78.5 272.4    5. Diabetes mellitus due to underlying condition with hyperosmolarity without coma, unspecified whether long term insulin use (HCC) patient taking dulaglutide and semaglutide E08.00 249.20    6. History of nuclear stress test , 07/02/2018, low risk. Z92.89 V15.89    7. Chest pain, unspecified type , still having occasional palpitations. R07.9 786.50    8. Palpitations , recent increase in symptoms, echocardiogram completed on 12/09/2021.  Ejection fraction was 45 to 50%.  Mildly increased wall thickness.  Mild concentric LVH.  Changing atenolol dosage as noted above R00.2 785.1    9. Tobacco abuse , continue Wellbutrin 150 mg twice daily. Z72.0 305.1    10     Severe obesity

## 2022-06-10 ENCOUNTER — Encounter

## 2022-06-12 MED ORDER — BUPROPION HCL ER (XL) 150 MG PO TB24
150 MG | ORAL_TABLET | ORAL | 3 refills | Status: DC
Start: 2022-06-12 — End: 2023-03-12

## 2022-06-12 MED ORDER — HYDROXYZINE PAMOATE 50 MG PO CAPS
50 MG | ORAL_CAPSULE | Freq: Three times a day (TID) | ORAL | 2 refills | Status: DC | PRN
Start: 2022-06-12 — End: 2023-03-12

## 2022-06-12 MED ORDER — METFORMIN HCL 1000 MG PO TABS
1000 MG | ORAL_TABLET | ORAL | 3 refills | Status: AC
Start: 2022-06-12 — End: 2022-12-12

## 2022-06-12 NOTE — Telephone Encounter (Signed)
Last seen: 03/23/22    Last filled: 09/06/21    metFORMIN (GLUCOPHAGE) 1,000 mg tablet 180 Tablet 1       Future Appointments   Date Time Provider Department Center   06/26/2022  2:40 PM Aggie Cosier, MD [facility] BS AMB          Contact pt for appointment.    Last seen: 03/23/22    Last filled: 03/23/22      Dispense Quantity: 90 capsule Refills: 1     Future Appointments   Date Time Provider Department Center   06/26/2022  2:40 PM Aggie Cosier, MD [facility] BS AMB

## 2022-06-26 ENCOUNTER — Ambulatory Visit: Payer: PRIVATE HEALTH INSURANCE | Attending: Cardiovascular Disease | Primary: Family Medicine

## 2022-06-30 ENCOUNTER — Encounter

## 2022-07-20 NOTE — Telephone Encounter (Signed)
Pt called to request an appt for pain in lateral right upper quadrant, chronic; pt states will go to urgent care and would like to be placed on PA Ellison's schedule for next week for VV to discuss pain more in detail.

## 2022-07-21 ENCOUNTER — Ambulatory Visit
Admit: 2022-07-21 | Discharge: 2022-07-21 | Payer: PRIVATE HEALTH INSURANCE | Attending: Family Medicine | Primary: Family Medicine

## 2022-07-21 DIAGNOSIS — I10 Essential (primary) hypertension: Secondary | ICD-10-CM

## 2022-07-21 NOTE — Progress Notes (Signed)
HISTORY OF PRESENT ILLNESS  Christy [name]  is a 46 y.o. y.o. female    She reports chronic pain following the left upper lateral abdomen and lower lateral chest present for several months.  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    Enlarged heart     Hypercholesterolemia     Hypertension     IBS (irritable bowel syndrome)     Interstitial cystitis     Sciatica     Sleep apnea     Spinal stenosis     Thyroid disease        Past Surgical History:   Procedure Laterality Date    CHOLECYSTECTOMY      2008    ORTHOPEDIC SURGERY      LEFT KNEE    ORTHOPEDIC SURGERY      LEFT ARM    PARTIAL HYSTERECTOMY (CERVIX NOT REMOVED)  2003    STILL HAVE OVARIES    PELVIC LAPAROSCOPY      THYROIDECTOMY         Family History   Problem Relation Age of Onset    Diabetes Mother     Heart Disease Mother     Diabetes Father     Cancer Sister     No Known Problems Brother     Heart Disease Maternal Aunt     No Known Problems Maternal Uncle     Cancer Paternal Aunt     No Known Problems Paternal Uncle     No Known Problems Maternal Grandmother     No Known Problems Maternal Grandfather     No Known Problems Paternal Grandmother     No Known Problems Paternal Grandfather     No Known Problems Other        Allergies   Allergen Reactions    Lisinopril Cough    Hydrocodone-Acetaminophen Itching    Hydrocodone     Lisinopril-Hydrochlorothiazide Other (See Comments)     Dry cough     Penicillins Other (See Comments)       Social History     Tobacco Use   Smoking Status Every Day    Packs/day: .25    Types: Cigarettes   Smokeless Tobacco Never       Social History     Substance and Sexual Activity   Alcohol Use Yes       Immunization History    Administered Date(s) Administered    COVID-19, PFIZER PURPLE top, DILUTE for use, (age 74 y+), 43mcg/0.3mL 03/31/2020, 04/21/2020, 11/12/2020    Influenza, FLUARIX, FLULAVAL, FLUZONE (age 79 mo+) AND AFLURIA, (age 17 y+), PF, 0.33mL 08/18/2020    Influenza, FLUCELVAX, (age 79 mo+), MDCK, PF, 0.34mL 08/24/2019    PPD Test 08/18/2020    Pneumococcal, PPSV23, PNEUMOVAX 23, (age 2y+), SC/IM, 0.53mL 10/22/2019    TDaP, ADACEL (age 9y-64y), BOOSTRIX (age 10y+), IM, 0.24mL 05/18/2018       Patient Active Problem List   Diagnosis    History of nuclear stress test  Morbid obesity (HCC)    CAD (coronary artery disease), native coronary artery    Hyperlipidemia with target low density lipoprotein (LDL) cholesterol less than 70 mg/dL    Palpitations    Spinal stenosis    Family history of premature CAD    Obesity, morbid (HCC)    IBS (irritable bowel syndrome)    Dizziness    Cardiac disorder    Diabetes mellitus, type 2 (HCC)    Chronic GERD    Chest pain    Tobacco abuse    Accelerated hypertension    Sleep apnea    Dysphagia    Generalized abdominal pain    Hypothyroidism    Incontinence of feces         Current Outpatient Medications:     buPROPion (WELLBUTRIN XL) 150 MG extended release tablet, TAKE 1 TABLET BY MOUTH IN THE MORNING AND IN THE EVENING, Disp: 60 tablet, Rfl: 3    hydrOXYzine pamoate (VISTARIL) 50 MG capsule, TAKE 1 CAPSULE BY MOUTH 3 TIMES DAILY AS NEEDED FOR ANXIETY., Disp: 90 capsule, Rfl: 2    metFORMIN (GLUCOPHAGE) 1000 MG tablet, TAKE 1 TABLET 2 TIMES DAILY (WITH MEALS) FOR 90 DAYS. INDICATIONS: TYPE 2 DIABETES MELLITUS, Disp: 180 tablet, Rfl: 3    tiZANidine (ZANAFLEX) 4 MG tablet, TAKE 1 TABLET TWICE A DAY AS NEEDED FOR MUSCLE SPASM. MAY TAKE ONE-HALF TABLET OR ONLY AT NIGHT, Disp: 60 tablet, Rfl: 1    PARoxetine (PAXIL) 40 MG tablet, TAKE 1 TABLET BY MOUTH NIGHTLY FOR DEPRESSION, Disp: 90 tablet, Rfl: 3    cloNIDine (CATAPRES) 0.3 MG tablet, TAKE 1 TABLET BY MOUTH TWO TIMES A DAY., Disp: 180 tablet,  Rfl: 3    pregabalin (LYRICA) 100 MG capsule, Take 1 capsule by mouth 2 times daily., Disp: , Rfl:     atorvastatin (LIPITOR) 40 MG tablet, TAKE 1 TABLET BY MOUTH NIGHTLY FOR 90 DAYS. INDICATIONS: HIGH CHOLESTEROL, Disp: 90 tablet, Rfl: 3    atenolol (TENORMIN) 50 MG tablet, Take 1 tablet by mouth in the morning and at bedtime, Disp: 60 tablet, Rfl: 6    Dulaglutide 4.5 MG/0.5ML SOPN, Inject 4.5 mg into the skin once a week Indications: Diabetes, Disp: 12 Adjustable Dose Pre-filled Pen Syringe, Rfl: 11    olmesartan (BENICAR) 40 MG tablet, TAKE 1 TABLET BY MOUTH EVERY DAY, Disp: 90 tablet, Rfl: 3    Rimegepant Sulfate 75 MG TBDP, Take 75 mg by mouth every other day, Disp: 16 tablet, Rfl: 5    gabapentin (NEURONTIN) 300 MG capsule, Take 1 capsule by mouth 3 times daily., Disp: , Rfl:     FARXIGA 10 MG tablet, TAKE 1 TABLET BY MOUTH EVERY DAY, Disp: 90 tablet, Rfl: 3    Lancets MISC, Use to test blood sugar once daily, Disp: , Rfl:     aspirin 81 MG EC tablet, Take 1 tablet by mouth daily, Disp: , Rfl:     levothyroxine (SYNTHROID) 200 MCG tablet, Take 1 tablet by mouth every morning (before breakfast), Disp: , Rfl:     linaclotide (LINZESS) 145 MCG capsule, Take 1 capsule by mouth every morning (before breakfast), Disp: , Rfl:     lurasidone (LATUDA) 40 MG TABS tablet, Take 1 tablet by mouth Daily with supper, Disp: , Rfl:     methocarbamol (ROBAXIN) 750 MG tablet, Take 1 tablet by mouth 4 times daily as needed, Disp: , Rfl:     nitroGLYCERIN (NITROSTAT) 0.4 MG SL tablet, Place 1 tablet under the  tongue every 5 minutes as needed, Disp: , Rfl:     pantoprazole (PROTONIX) 40 MG tablet, Take 1 tablet by mouth 2 times daily, Disp: , Rfl:     SITagliptin (JANUVIA) 100 MG tablet, Take 1 tablet by mouth daily, Disp: , Rfl:     traZODone (DESYREL) 50 MG tablet, Take 1 tablet by mouth nightly, Disp: , Rfl:       Review of Systems   Respiratory:  Negative for chest tightness and shortness of breath.    Cardiovascular:   Negative for chest pain and leg swelling.   Gastrointestinal:  Positive for abdominal pain.   Skin:  Positive for rash.       BP (!) 130/110   Pulse (!) 112   Temp 96.8 F (36 C) (Temporal)   Resp 16   Ht 5\' 1"  (1.549 m)   Wt 207 lb (93.9 kg)   SpO2 99%   BMI 39.11 kg/m     Physical Exam  Vitals and nursing note reviewed.   Constitutional:       General: She is not in acute distress.     Appearance: Normal appearance. She is not ill-appearing.   HENT:      Head: Normocephalic.   Eyes:      Extraocular Movements: Extraocular movements intact.   Cardiovascular:      Rate and Rhythm: Normal rate and regular rhythm.      Heart sounds: Normal heart sounds.   Pulmonary:      Effort: Pulmonary effort is normal.      Breath sounds: Normal breath sounds.   Chest:      Chest wall: No tenderness.   Abdominal:      Palpations: Abdomen is soft.      Tenderness: There is no abdominal tenderness.   Musculoskeletal:      Cervical back: Neck supple.   Skin:     General: Skin is warm and dry.   Neurological:      Mental Status: She is alert.      Comments: two round macular lesions, pink, approximately a 0.75 cm in diameter distal right lower leg   Psychiatric:         Mood and Affect: Mood normal.         Behavior: Behavior normal.          ASSESSMENT and PLAN    1. Accelerated hypertension  2. Abdominal discomfort in left upper quadrant  3. Rash    Current Status:  Inadequate control of hypertension would be the most urgent issue, the patient indicates that she is in the process of discussing this with her cardiology consultant and has an appointment in the near future  Longstanding upper left abdomen, lower left chest discomfort with unrevealing exam  Benign appearing rash right lower leg    Plan:  Continue current medications  Cardiology follow-up as planned  Follow-up with new PCP    Lenice Pressman, MD    Please Note:  This document has been produced using voice recognition software.  Unrecognized errors in  transcription may be present.

## 2022-07-21 NOTE — Progress Notes (Signed)
CARMELITA AMPARO is a 46 y.o. female (DOB: 05/08/1976) presenting to address:    Chief Complaint   Patient presents with    Pain     ab    Abdominal Pain     Left upper quad pain that goes around to the back . sometimes  right hurts, also pelvic pain     Skin Problem     Right leg red skin spots comes and goes . Doesn't itch and is not painful.     Immunizations     Would like flu shot .        Vitals:    07/21/22 0933   BP: (!) 130/110   Pulse:    Resp:    Temp:    SpO2:        Coordination of Care Questionaire:   1. "Have you been to the ER, urgent care clinic since your last visit?  Hospitalized since your last visit?" No    2. "Have you seen or consulted any other health care providers outside of the [place] since your last visit?" No     3. For patients aged 77-75: Has the patient had a colonoscopy / FIT/ Cologuard? Yes - no Care Gap present      If the patient is female:    4. For patients aged 43-74: Has the patient had a mammogram within the past 2 years? NA - based on age or sex      64. For patients aged 21-65: Has the patient had a pap smear? No    Advanced Directive:   1. Do you have an Advanced Directive? No    2. Would you like information on Advanced Directives? No  Flu shot Immunization/s administered 07/21/2022 by Aidenn Skellenger J. Lorne Skeens, LPN   Patient tolerated procedure well.  No reactions noted.

## 2022-07-21 NOTE — Patient Instructions (Signed)
Current Status:  Inadequate control of hypertension would be the most urgent issue, the patient indicates that she is in the process of discussing this with her cardiology consultant and has an appointment in the near future  Longstanding upper left abdomen, lower left chest discomfort with unrevealing exam  Benign appearing rash right lower leg    Plan:  Continue current medications  Cardiology follow-up as planned  Follow-up with new PCP

## 2022-07-31 ENCOUNTER — Ambulatory Visit: Payer: PRIVATE HEALTH INSURANCE | Attending: Cardiovascular Disease | Primary: Family Medicine

## 2022-08-16 NOTE — Telephone Encounter (Signed)
Patient dropped off wellness form for work only completed the flu shot sections since she was a same day visit and we didn't complete a biometric screening . Patient called and notified it was ready for pick up and that this was the only part completed since we didn't check her cholesterol or blood sugar as required to sign off on the Biometric section. Patient voiced understanding

## 2022-09-04 ENCOUNTER — Ambulatory Visit: Admit: 2022-09-04 | Payer: PRIVATE HEALTH INSURANCE | Primary: Family Medicine

## 2022-09-04 ENCOUNTER — Encounter

## 2022-09-04 DIAGNOSIS — R000 Tachycardia, unspecified: Secondary | ICD-10-CM

## 2022-09-06 NOTE — ED Provider Notes (Signed)
Formatting of this note is different from the original.    [place]    Time of Arrival:   09/06/22 [id number]    Final diagnoses:   [R07.9] Chest pain     Medical Decision Making:      Differential Diagnosis:   ACS v HTN emergency v PE    Social Determinants of Health:  social factors reviewed, did not limit treatment         HEART Score: 4    Pulmonary Embolism Rule : PERC rule cannot be used to rule out PE in this patient      Wells'-PE Score: 1.5                Supplemental Historians include:  patient    ED Course:     9:29 AM: Pt is a 46 yo F with uncontrolled HTN, T2DM, and baseline angina who presents with increased frequency of CP and HTN over the past week. She occasionally has L/s CP episodes < 5 min per episode at baseline but throughout the past week has had multiple episodes a day sometimes lasting hours. Her Bps have also been in the 200s/150s many times in the past week. She gets her BP meds from her cardiologist and is compliant though takes multiple doses of atenolol a day to try to get her pressures down. Denies vision changes, abd pain, dysuria. Has some SOB. Chest is not tender. Last stress test was 2019 and was normal. Did have echo with EF 45-50% 9 months ago. 2 day event monitor showed sinus tachycardia from 2 days go.    Overall pt is relatively high risk CP. EKG sinus tachycardic congruent with prior dx. Will fu CBC, BMP, trops, CXR, dimer. Want to ro HTN emergency and NSTEMI. Depending on results may benefit from [facility] for stress testing.     12:21 PM: Trops WNL x 2. EKG and labs reassuring. Moderate heart so will stress test. Placed into ED obs.         Documentation/Prior Results Review:  Initial ED Provider Note , Old medical records, Previous electrocardiograms, Nursing notes    Rhythm interpretation from monitor: sinus tachycardia    Imaging Interpreted by me: Not Applicable    CHEST PA AND LATERAL   Final Result     1. No acute cardiopulmonary findings.     Signed  By: Astrid Drafts, MD on 09/06/2022 9:51 AM       EKG 12 LEAD UNIT PERFORMED   Final Result     STRESS ECHO (EXERCISE)    (Results Pending)     .     Discussion of Mangement with other Physicians, QHP or Appropriate Source:   None    .    Disposition:  ED Observation    New Prescriptions    No medications on file     Chief Complaint   Patient presents with    CHEST PAIN (ADULT)     CHEST PAIN (ADULT)      Pt is a 46 yo F with uncontrolled HTN, T2DM, and baseline angina who presents with increased frequency of CP and HTN over the past week. She occasionally has L/s CP episodes < 5 min per episode at baseline but throughout the past week has had multiple episodes a day sometimes lasting hours. Her Bps have also been in the 200s/150s many times in the past week. She gets her BP meds from her cardiologist and is compliant though takes multiple  doses of atenolol a day to try to get her pressures down. Denies vision changes, abd pain, dysuria. Has some SOB. Chest is not tender. Last stress test was 2019 and was normal. Did have echo with EF 45-50% 9 months ago. 2 day event monitor showed sinus tachycardia from 2 days go.    Review of Systems    Physical Exam  Vitals reviewed.   Constitutional:       General: She is not in acute distress.     Appearance: Normal appearance. She is obese. She is not ill-appearing, toxic-appearing or diaphoretic.   HENT:      Head: Normocephalic and atraumatic.      Mouth/Throat:      Mouth: Mucous membranes are moist.      Pharynx: Oropharynx is clear.   Eyes:      Extraocular Movements: Extraocular movements intact.      Conjunctiva/sclera: Conjunctivae normal.      Pupils: Pupils are equal, round, and reactive to light.   Cardiovascular:      Rate and Rhythm: Tachycardia present.      Pulses: Normal pulses.      Heart sounds: Normal heart sounds.   Pulmonary:      Effort: Pulmonary effort is normal. No respiratory distress.      Breath sounds: Normal breath sounds.   Abdominal:       General: Abdomen is flat. Bowel sounds are normal.      Palpations: Abdomen is soft.   Musculoskeletal:         General: No tenderness.      Cervical back: Normal range of motion and neck supple.   Skin:     General: Skin is warm and dry.      Capillary Refill: Capillary refill takes more than 3 seconds.   Neurological:      General: No focal deficit present.      Mental Status: She is alert and oriented to person, place, and time. Mental status is at baseline.   Psychiatric:         Mood and Affect: Mood normal.         Behavior: Behavior normal.         Thought Content: Thought content normal.         Judgment: Judgment normal.     Past Medical History:   Diagnosis Date    Cardiac disorder     Cystitis, interstitial     Diabetes mellitus ([place])     Diabetic neuropathy ([place])     Hypertension      Past Surgical History:   Procedure Laterality Date    CHOLECYSTECTOMY      HYSTERECTOMY      TUBAL LIGATION       Family History   Problem Relation Age of Onset    Heart Disease Father     Other Family History Father     Diabetes Father     Cancer Father     Heart Disease Mother     Other Family History Mother     Diabetes Mother     Thyroid Disease Mother      Social History     Occupational History    Not on file   Tobacco Use    Smoking status: Every Day     Packs/day: 0.25     Years: 12.00     Additional pack years: 0.00     Total pack years: 3.00     Types: Cigarettes  Smokeless tobacco: Never    Tobacco comments:     tobacco cessation counselling done   Vaping Use    Vaping Use: Never used   Substance and Sexual Activity    Alcohol use: Yes     Comment: occasionally    Drug use: No     Comment: denies    Sexual activity: Not on file     No outpatient medications have been marked as taking for the 09/06/22 encounter [facility] Encounter).     Allergies   Allergen Reactions    Lisinopril cough    Vicodin [Hydrocodone-Acetaminophen] rash/itching    Penicillins unknown     Yeast infection      Vital Signs:  Patient Vitals  for the past 72 hrs:   Temp Heart Rate Pulse Resp BP BP Mean SpO2 Weight   09/06/22 1215 -- 104 104 15 149/91 (!) 104 MM HG 98 % --   09/06/22 1200 -- 106 107 (!) 34 185/83 (!) 109 MM HG 99 % --   09/06/22 1145 -- 107 107 23 (!) 188/117 (!) 133 MM HG 99 % --   09/06/22 1130 -- 107 110 21 (!) 186/115 (!) 133 MM HG 100 % --   09/06/22 1115 -- 109 109 19 (!) 211/129 (!) 149 MM HG 99 % --   09/06/22 1100 -- 111 112 20 (!) 194/125 (!) 141 MM HG 100 % --   09/06/22 1045 -- 113 114 13 (!) 204/129 (!) 145 MM HG 99 % --   09/06/22 1030 -- 109 110 12 (!) 191/124 (!) 139 MM HG 99 % --   09/06/22 1015 -- 106 106 17 (!) 193/120 (!) 135 MM HG 100 % --   09/06/22 1000 -- 118 115 16 (!) 184/132 (!) 145 MM HG 96 % --   09/06/22 0945 -- 109 110 18 (!) 193/127 (!) 145 MM HG 100 % --   09/06/22 0930 -- 112 113 16 (!) 193/135 (!) 149 MM HG 100 % --   09/06/22 0927 98.2 F (36.8 C) 114 -- 18 (!) 199/126 (!) 150 MM HG 100 % --   09/06/22 0924 -- -- -- -- -- -- -- 98.4 kg (217 lb)   09/06/22 0843 -- 120 -- 18 -- -- 99 % --     Diagnostics:  Labs:    Results for orders placed or performed during the hospital encounter of 09/06/22   D-DIMER QUANTITATIVE   Result Value Ref Range    D-Dimer 0.38 0.00 - 1.12 mg/L FEU   COMPREHENSIVE METABOLIC PANEL   Result Value Ref Range    Potassium 4.3 3.5 - 5.5 mmol/L    Sodium 135 133 - 145 mmol/L    Chloride 100 98 - 110 mmol/L    Glucose 147 (H) 70 - 99 mg/dL    Calcium 8.9 8.4 - 10.5 mg/dL    Albumin 4.3 3.5 - 5.0 g/dL    SGPT (ALT) 15 5 - 40 U/L    SGOT (AST) 14 10 - 37 U/L    Bilirubin Total 0.3 0.2 - 1.2 mg/dL    Alkaline Phosphatase 73 25 - 115 U/L    BUN 13 6 - 22 mg/dL    CO2 23 20 - 32 mmol/L    Creatinine 0.8 0.5 - 1.2 mg/dL    eGFR >60.0 >60.0 mL/min/1.73 sq.m.    Globulin 3.3 2.0 - 4.0 g/dL    A/G Ratio 1.3 1.1 - 2.6 ratio    Total Protein  7.6 6.4 - 8.3 g/dL    Anion Gap 12.0 3.0 - 15.0 mmol/L   CBC WITH DIFFERENTIAL AUTO   Result Value Ref Range    WBC 9.1 4.0 - 11.0 K/uL    RBC 4.55 3.80 -  5.20 M/uL    HGB 13.0 11.7 - 16.0 g/dL    HCT 39.9 35.1 - 48.0 %    MCV 88 80 - 99 fL    MCH 29 26 - 34 pg    MCHC 33 31 - 36 g/dL    RDW 13.1 10.0 - 15.5 %    Platelet 257 140 - 440 K/uL    MPV 11.1 9.0 - 13.0 fL   Troponin Care Path   Result Value Ref Range    Troponin (T) Quant High Sensitivity (5th Gen) 7 0 - 19 ng/L   MANUAL DIFFERENTIAL   Result Value Ref Range    Segmented Neutrophils (Manual) 62 40 - 75 %    Lymphocytes (Manual) 31 20 - 45 %    Monocytes (Manual) 4 3 - 12 %    Eosinophils (Manual) 3 0 - 6 %    Absolute Neutrophils (Manual) 5.6 1.8 - 7.7 K/uL    Absolute Lymphocytes (Manual) 2.8 1.0 - 4.8 K/uL    Absolute Monocytes (Manual) 0.4 0.1 - 1.0 K/uL    Absolute Eosinophils (Manual) 0.3 0.0 - 0.5 K/uL    Elliptocytes Occasional (A) (none)    Normochromic RBC Normochromic     Normocytic RBC Normocytic     Total Cells Counted 100     Smear Evaluation Platelet count was verified by smear review.    Troponin 1 HR   Result Value Ref Range    Troponin (T) Quant High Sensitivity (5th Gen) <6 0 - 19 ng/L     ECG:  Results for orders placed or performed during the hospital encounter of 09/06/22   EKG 12 LEAD UNIT PERFORMED   Result Value Ref Range Status    Heart Rate 110 bpm Final    RR Interval 548 ms Final    Atrial Rate 110 ms Final    P-R Interval 166 ms Final    P Duration 105 ms Final    P Horizontal Axis -21 deg Final    P Front Axis 50 deg Final    Q Onset 500 ms Final    QRSD Interval 85 ms Final    QT Interval 346 ms Final    QTcB 467 ms Final    QTcF 424 ms Final    QRS Horizontal Axis -59 deg Final    QRS Axis 15 deg Final    I-40 Front Axis 45 deg Final    t-40 Horizontal Axis -83 deg Final    T-40 Front Axis -1 deg Final    T Horizontal Axis 33 deg Final    T Wave Axis 54 deg Final    S-T Horizontal Axis 56 deg Final    S-T Front Axis 60 deg Final    Impression - ABNORMAL ECG -  Final    Impression   Final     -Sinus tachycardia, no significant change from ECG 05/17/22-     Medications  ordered/given in the ED  Medications   acetaminophen (TylenoL) tablet 1,000 mg (1,000 mg Oral Given 09/06/22 0954)   nitroglycerin (Nitrostat) tablet 0.4 mg (0.4 mg Sublingual Given 09/06/22 0954)   aspirin tablet 325 mg (325 mg Oral Given 09/06/22 1030)   morphine injection 2 mg (  2 mg IV Push Given 09/06/22 1039)   Alum-Mag Hydroxide-Simeth DS (Mylanta DS) 400-400-40 mg/5 mL suspension 15 mL (15 mL Oral Given 09/06/22 1030)       Electronically signed by Eleonore Chiquito, MD at 09/06/2022  6:24 PM EST

## 2022-09-06 NOTE — Procedures (Signed)
Formatting of this note might be different from the original.  Patient has orders for Exercise Stress Echo.   Per EMR patient Blood pressure not well controlled, also received IV Morphine at 1039.  Patient would need to be without narcotics for 6 hours prior to testing. Patient also had a Nuclear Stress test done in 2019 with Lexiscan. Patient maybe a better candidate for Nuclear Stress Test which can be done on 09/07/22 if patient remains inpatient and is stable for stress testing. Patient can eat and drink.  Dr. Mariane Masters and Bedside RN notified via [place].  Please make patient NPO at midnight on 09/07/22 in preparation of stress test.     Morrell Riddle, BSN, RN   Noninvasive Cardiology Lab  [facility]  [phone]-  Stress Lab notified that patient will be leaving AMA  Electronically signed by Morrell Riddle, RN at 09/06/2022  1:49 PM EST

## 2022-09-06 NOTE — ED Notes (Signed)
Formatting of this note might be different from the original.  Pt states she does not want to stay for stress test, wants to leave. Discussed risks, benefits, informed would be leaving  AMA. Pt insists on leaving.  Electronically signed by Pati Gallo, RN at 09/06/2022  1:39 PM EST

## 2022-09-06 NOTE — ED Provider Notes (Signed)
Formatting of this note is different from the original.  Observation Discharge Note    Date/Time Obs Discharge: 2023 1:38 PM: Pt leaving AMA. Discused risk v benefits of leaving v staying including risk of death and pt understands and is electing to leave against medical advice. Pt has capacity.    Objective:  Patient Vitals for the past 8 hrs:   Temp Heart Rate Pulse Resp BP BP Mean SpO2 Weight   09/06/22 1215 -- 104 104 15 149/91 (!) 104 MM HG 98 % --   09/06/22 1200 -- 106 107 (!) 34 185/83 (!) 109 MM HG 99 % --   09/06/22 1145 -- 107 107 23 (!) 188/117 (!) 133 MM HG 99 % --   09/06/22 1130 -- 107 110 21 (!) 186/115 (!) 133 MM HG 100 % --   09/06/22 1115 -- 109 109 19 (!) 211/129 (!) 149 MM HG 99 % --   09/06/22 1100 -- 111 112 20 (!) 194/125 (!) 141 MM HG 100 % --   09/06/22 1045 -- 113 114 13 (!) 204/129 (!) 145 MM HG 99 % --   09/06/22 1030 -- 109 110 12 (!) 191/124 (!) 139 MM HG 99 % --   09/06/22 1015 -- 106 106 17 (!) 193/120 (!) 135 MM HG 100 % --   09/06/22 1000 -- 118 115 16 (!) 184/132 (!) 145 MM HG 96 % --   09/06/22 0945 -- 109 110 18 (!) 193/127 (!) 145 MM HG 100 % --   09/06/22 0930 -- 112 113 16 (!) 193/135 (!) 149 MM HG 100 % --   09/06/22 0927 98.2 F (36.8 C) 114 -- 18 (!) 199/126 (!) 150 MM HG 100 % --   09/06/22 0924 -- -- -- -- -- -- -- 98.4 kg (217 lb)   09/06/22 0843 -- 120 -- 18 -- -- 99 % --     Exam: NA    Observation Course Summary:  Stable without sxs on interview prior to [place] while in ED Observation: NA    Discharge Assessment & Plan:    AMA    Discharge Instructions and follow-up as noted in system.    Final Diagnosis: (R07.9) Chest pain    Independently reviewed lab and imaging results from ED course:  Results for orders placed or performed during the hospital encounter of 09/06/22   D-DIMER QUANTITATIVE   Result Value Ref Range    D-Dimer 0.38 0.00 - 1.12 mg/L FEU   COMPREHENSIVE METABOLIC PANEL   Result Value Ref Range    Potassium 4.3 3.5 - 5.5 mmol/L    Sodium 135  133 - 145 mmol/L    Chloride 100 98 - 110 mmol/L    Glucose 147 (H) 70 - 99 mg/dL    Calcium 8.9 8.4 - 10.5 mg/dL    Albumin 4.3 3.5 - 5.0 g/dL    SGPT (ALT) 15 5 - 40 U/L    SGOT (AST) 14 10 - 37 U/L    Bilirubin Total 0.3 0.2 - 1.2 mg/dL    Alkaline Phosphatase 73 25 - 115 U/L    BUN 13 6 - 22 mg/dL    CO2 23 20 - 32 mmol/L    Creatinine 0.8 0.5 - 1.2 mg/dL    eGFR >60.0 >60.0 mL/min/1.73 sq.m.    Globulin 3.3 2.0 - 4.0 g/dL    A/G Ratio 1.3 1.1 - 2.6 ratio    Total Protein 7.6 6.4 -  8.3 g/dL    Anion Gap 12.0 3.0 - 15.0 mmol/L   CBC WITH DIFFERENTIAL AUTO   Result Value Ref Range    WBC 9.1 4.0 - 11.0 K/uL    RBC 4.55 3.80 - 5.20 M/uL    HGB 13.0 11.7 - 16.0 g/dL    HCT 39.9 35.1 - 48.0 %    MCV 88 80 - 99 fL    MCH 29 26 - 34 pg    MCHC 33 31 - 36 g/dL    RDW 13.1 10.0 - 15.5 %    Platelet 257 140 - 440 K/uL    MPV 11.1 9.0 - 13.0 fL   Troponin Care Path   Result Value Ref Range    Troponin (T) Quant High Sensitivity (5th Gen) 7 0 - 19 ng/L   MANUAL DIFFERENTIAL   Result Value Ref Range    Segmented Neutrophils (Manual) 62 40 - 75 %    Lymphocytes (Manual) 31 20 - 45 %    Monocytes (Manual) 4 3 - 12 %    Eosinophils (Manual) 3 0 - 6 %    Absolute Neutrophils (Manual) 5.6 1.8 - 7.7 K/uL    Absolute Lymphocytes (Manual) 2.8 1.0 - 4.8 K/uL    Absolute Monocytes (Manual) 0.4 0.1 - 1.0 K/uL    Absolute Eosinophils (Manual) 0.3 0.0 - 0.5 K/uL    Elliptocytes Occasional (A) (none)    Normochromic RBC Normochromic     Normocytic RBC Normocytic     Total Cells Counted 100     Smear Evaluation Platelet count was verified by smear review.    Troponin 1 HR   Result Value Ref Range    Troponin (T) Quant High Sensitivity (5th Gen) <6 0 - 19 ng/L     Results for orders placed or performed during the hospital encounter of 09/06/22   EKG 12 LEAD UNIT PERFORMED   Result Value Ref Range Status    Heart Rate 110 bpm Final    RR Interval 548 ms Final    Atrial Rate 110 ms Final    P-R Interval 166 ms Final    P Duration 105 ms Final     P Horizontal Axis -21 deg Final    P Front Axis 50 deg Final    Q Onset 500 ms Final    QRSD Interval 85 ms Final    QT Interval 346 ms Final    QTcB 467 ms Final    QTcF 424 ms Final    QRS Horizontal Axis -59 deg Final    QRS Axis 15 deg Final    I-40 Front Axis 45 deg Final    t-40 Horizontal Axis -83 deg Final    T-40 Front Axis -1 deg Final    T Horizontal Axis 33 deg Final    T Wave Axis 54 deg Final    S-T Horizontal Axis 56 deg Final    S-T Front Axis 60 deg Final    Impression - ABNORMAL ECG -  Final    Impression   Final     -Sinus tachycardia, no significant change from ECG 05/17/22-     CHEST PA AND LATERAL   Final Result     1. No acute cardiopulmonary findings.     Signed By: Astrid Drafts, MD on 09/06/2022 9:51 AM       EKG 12 LEAD UNIT PERFORMED   Final Result       Electronically signed by Molly Maduro  A, MD at 09/06/2022  6:24 PM EST

## 2022-09-06 NOTE — ED Provider Notes (Signed)
Formatting of this note is different from the original.  ED Observation Admission    ED Observation Admission   Date/Time: 11:48 am 09/06/22    Assessment / Plan:    Will keep in ED obs for further diagnostics to determine need for admission or escalation of care   Diagnostic considerations: ACS/MI  Plan: I plan to observe the patient for repeat troponin and EKG, monitor for arrhythmia, re-evaluation of pain / stress test, to ensure there is no evidence of AMI.    Consults while in ED Observation: NA    Subjective:     Pt is a 46 yo F with uncontrolled HTN, T2DM, and baseline angina who presents with increased frequency of CP and HTN over the past week. She occasionally has L/s CP episodes < 5 min per episode at baseline but throughout the past week has had multiple episodes a day sometimes lasting hours. Her Bps have also been in the 200s/150s many times in the past week. She gets her BP meds from her cardiologist and is compliant though takes multiple doses of atenolol a day to try to get her pressures down. Denies vision changes, abd pain, dysuria. Has some SOB. Chest is not tender. Last stress test was 2019 and was normal. Did have echo with EF 45-50% 9 months ago. 2 day event monitor showed sinus tachycardia from 2 days go.    Overall pt is relatively high risk CP. EKG sinus tachycardic congruent with prior dx. Will fu CBC, BMP, trops, CXR, dimer. Want to ro HTN emergency and NSTEMI. Depending on results may benefit from [facility] for stress testing.     12:21 PM: Trops WNL x 2. EKG and labs reassuring. Moderate heart so will stress test. Placed into ED obs.     1:38 PM: Pt leaving AMA. Discused risk v benefits of leaving v staying including risk of death and pt understands and is electing to leave against medical advice. Pt has capacity.    Objective:  Patient Vitals for the past 8 hrs:   Temp Heart Rate Pulse Resp BP BP Mean SpO2 Weight   09/06/22 1100 -- 111 112 20 (!) 194/125 (!) 141 MM HG 100 % --   09/06/22 1045  -- 113 114 13 (!) 204/129 (!) 145 MM HG 99 % --   09/06/22 1030 -- 109 110 12 (!) 191/124 (!) 139 MM HG 99 % --   09/06/22 1015 -- 106 106 17 (!) 193/120 (!) 135 MM HG 100 % --   09/06/22 1000 -- 118 115 16 (!) 184/132 (!) 145 MM HG 96 % --   09/06/22 0945 -- 109 110 18 (!) 193/127 (!) 145 MM HG 100 % --   09/06/22 0930 -- 112 113 16 (!) 193/135 (!) 149 MM HG 100 % --   09/06/22 0927 98.2 F (36.8 C) 114 -- 18 (!) 199/126 (!) 150 MM HG 100 % --   09/06/22 0924 -- -- -- -- -- -- -- 98.4 kg (217 lb)   09/06/22 0843 -- 120 -- 18 -- -- 99 % --     Exam: Physical Exam  Vitals reviewed.   Constitutional:       General: She is not in acute distress.     Appearance: Normal appearance. She is obese. She is not ill-appearing, toxic-appearing or diaphoretic.   HENT:      Head: Normocephalic and atraumatic.      Mouth/Throat:      Mouth: Mucous membranes are moist.  Pharynx: Oropharynx is clear.   Eyes:      Extraocular Movements: Extraocular movements intact.      Conjunctiva/sclera: Conjunctivae normal.      Pupils: Pupils are equal, round, and reactive to light.   Cardiovascular:      Rate and Rhythm: Tachycardia present.      Pulses: Normal pulses.      Heart sounds: Normal heart sounds.   Pulmonary:      Effort: Pulmonary effort is normal. No respiratory distress.      Breath sounds: Normal breath sounds.   Abdominal:      General: Abdomen is flat. Bowel sounds are normal.      Palpations: Abdomen is soft.   Musculoskeletal:         General: No tenderness.      Cervical back: Normal range of motion and neck supple.   Skin:     General: Skin is warm and dry.    Neurological:      General: No focal deficit present.      Mental Status: She is alert and oriented to person, place, and time. Mental status is at baseline.   Psychiatric:         Mood and Affect: Mood normal.         Behavior: Behavior normal.         Thought Content: Thought content normal.         Judgment: Judgment normal.     Independently reviewed lab and  imaging results from ED course:  Results for orders placed or performed during the hospital encounter of 09/06/22   D-DIMER QUANTITATIVE   Result Value Ref Range    D-Dimer 0.38 0.00 - 1.12 mg/L FEU   COMPREHENSIVE METABOLIC PANEL   Result Value Ref Range    Potassium 4.3 3.5 - 5.5 mmol/L    Sodium 135 133 - 145 mmol/L    Chloride 100 98 - 110 mmol/L    Glucose 147 (H) 70 - 99 mg/dL    Calcium 8.9 8.4 - 10.5 mg/dL    Albumin 4.3 3.5 - 5.0 g/dL    SGPT (ALT) 15 5 - 40 U/L    SGOT (AST) 14 10 - 37 U/L    Bilirubin Total 0.3 0.2 - 1.2 mg/dL    Alkaline Phosphatase 73 25 - 115 U/L    BUN 13 6 - 22 mg/dL    CO2 23 20 - 32 mmol/L    Creatinine 0.8 0.5 - 1.2 mg/dL    eGFR >60.0 >60.0 mL/min/1.73 sq.m.    Globulin 3.3 2.0 - 4.0 g/dL    A/G Ratio 1.3 1.1 - 2.6 ratio    Total Protein 7.6 6.4 - 8.3 g/dL    Anion Gap 12.0 3.0 - 15.0 mmol/L   CBC WITH DIFFERENTIAL AUTO   Result Value Ref Range    WBC 9.1 4.0 - 11.0 K/uL    RBC 4.55 3.80 - 5.20 M/uL    HGB 13.0 11.7 - 16.0 g/dL    HCT 39.9 35.1 - 48.0 %    MCV 88 80 - 99 fL    MCH 29 26 - 34 pg    MCHC 33 31 - 36 g/dL    RDW 13.1 10.0 - 15.5 %    Platelet 257 140 - 440 K/uL    MPV 11.1 9.0 - 13.0 fL   Troponin Care Path   Result Value Ref Range    Troponin (T) Quant High Sensitivity (5th Gen) 7  0 - 19 ng/L   MANUAL DIFFERENTIAL   Result Value Ref Range    Segmented Neutrophils (Manual) 62 40 - 75 %    Lymphocytes (Manual) 31 20 - 45 %    Monocytes (Manual) 4 3 - 12 %    Eosinophils (Manual) 3 0 - 6 %    Absolute Neutrophils (Manual) 5.6 1.8 - 7.7 K/uL    Absolute Lymphocytes (Manual) 2.8 1.0 - 4.8 K/uL    Absolute Monocytes (Manual) 0.4 0.1 - 1.0 K/uL    Absolute Eosinophils (Manual) 0.3 0.0 - 0.5 K/uL    Elliptocytes Occasional (A) (none)    Normochromic RBC Normochromic     Normocytic RBC Normocytic     Total Cells Counted 100     Smear Evaluation Platelet count was verified by smear review.    Troponin 1 HR   Result Value Ref Range    Troponin (T) Quant High Sensitivity (5th  Gen) <6 0 - 19 ng/L     Results for orders placed or performed during the hospital encounter of 09/06/22   EKG 12 LEAD UNIT PERFORMED   Result Value Ref Range Status    Heart Rate 110 bpm Final    RR Interval 548 ms Final    Atrial Rate 110 ms Final    P-R Interval 166 ms Final    P Duration 105 ms Final    P Horizontal Axis -21 deg Final    P Front Axis 50 deg Final    Q Onset 500 ms Final    QRSD Interval 85 ms Final    QT Interval 346 ms Final    QTcB 467 ms Final    QTcF 424 ms Final    QRS Horizontal Axis -59 deg Final    QRS Axis 15 deg Final    I-40 Front Axis 45 deg Final    t-40 Horizontal Axis -83 deg Final    T-40 Front Axis -1 deg Final    T Horizontal Axis 33 deg Final    T Wave Axis 54 deg Final    S-T Horizontal Axis 56 deg Final    S-T Front Axis 60 deg Final    Impression - ABNORMAL ECG -  Final    Impression   Final     -Sinus tachycardia, no significant change from ECG 05/17/22-     CHEST PA AND LATERAL   Final Result     1. No acute cardiopulmonary findings.     Signed By: Astrid Drafts, MD on 09/06/2022 9:51 AM       EKG 12 LEAD UNIT PERFORMED   Final Result     STRESS ECHO (EXERCISE)    (Results Pending)       Electronically signed by Eleonore Chiquito, MD at 09/06/2022  6:24 PM EST

## 2022-09-06 NOTE — ED Provider Notes (Signed)
Formatting of this note is different from the original.  ED Resident Supervision Note    I have seen and evaluated Christy [name] and agree with the provider?s findings (exceptions, if any, noted below).  I reviewed and directed the Emergency Department treatment given to Christy [name] history chronic tachycardia, HTN, DM, followed by [facility] cardiology presents emergency department with chest pain and hypertension.    On exam patient clear to auscultation bilateral, heart regular rate and rhythm, strong distal pulses, no palpable cords or chest palpation lower legs    Patient pulled up her patient portal and could not find the stress test she thought she had in February, there was an echo result.    Patient took her regular atenolol and losartan this morning, has a history of chronic tachycardia and normally runs in the 110s to 120s-patient's blood pressure has improved in the emergency department now 146/85    A/P: Discussed with patient plan for lab work, if reassuring may stress test in ED.    Critical Care Time:  none    Procedures:  none    S:  46 y.o. female  with a chief complaint of CHEST PAIN (ADULT)    O:  Patient Vitals for the past 72 hrs:   Temp Heart Rate Resp BP BP Mean SpO2 Weight   09/06/22 0927 98.2 F (36.8 C) 114 18 (!) 199/126 (!) 150 MM HG 100 % --   09/06/22 0924 -- -- -- -- -- -- 98.4 kg (217 lb)   09/06/22 0843 -- 120 18 -- -- 99 % --       Results for orders placed or performed during the hospital encounter of 09/06/22   EKG 12 LEAD UNIT PERFORMED   Result Value Ref Range Status    Heart Rate 110 bpm Final    RR Interval 548 ms Final    Atrial Rate 110 ms Final    P-R Interval 166 ms Final    P Duration 105 ms Final    P Horizontal Axis -21 deg Final    P Front Axis 50 deg Final    Q Onset 500 ms Final    QRSD Interval 85 ms Final    QT Interval 346 ms Final    QTcB 467 ms Final    QTcF 424 ms Final    QRS Horizontal Axis -59 deg Final    QRS Axis 15 deg Final    I-40 Front Axis 45  deg Final    t-40 Horizontal Axis -83 deg Final    T-40 Front Axis -1 deg Final    T Horizontal Axis 33 deg Final    T Wave Axis 54 deg Final    S-T Horizontal Axis 56 deg Final    S-T Front Axis 60 deg Final    Impression - ABNORMAL ECG -  Final    Impression   Final     -Sinus tachycardia, no significant change from ECG 05/17/22-       Electronically signed by Eleonore Chiquito, MD at 09/06/2022 12:03 PM EST

## 2022-09-06 NOTE — ED Triage Notes (Signed)
Formatting of this note is different from the original.  Pt ambulatory w/ c/o CP and high blood pressure this AM.     Pt is on BP medications and is compliant.     Past Medical History:   Diagnosis Date    Cardiac disorder     Cystitis, interstitial     Diabetes mellitus ([place])     Diabetic neuropathy ([place])     Hypertension      Electronically signed by Orpha Bur, RN at 09/06/2022  8:43 AM EST

## 2022-09-06 NOTE — ED Notes (Signed)
Formatting of this note might be different from the original.  Pt. States chest pain decreased from 8 to 6 after morphine.   Electronically signed by Lorayne Bender, RN at 09/06/2022 11:03 AM EST

## 2022-09-06 NOTE — ED Notes (Signed)
Formatting of this note might be different from the original.  Pt c/o upper chest tightness, feels tight to breath, has been intermittent.   Electronically signed by Pati Gallo, RN at 09/06/2022  9:23 AM EST

## 2022-10-19 NOTE — ED Notes (Signed)
Formatting of this note might be different from the original.  Emergency Department Admission Handoff Note    ED Nurse Phone Number [phone]    Sepsis Alert//ABLAST FORM:    Sepsis Screen Positive:   No    1st set of Blood Cultures obtained:  No (if no, please state why)    2nd set of Blood Cultures obtained:  No (if no, please state why)    This patient meets the criteria for a diagnosis of Sepsis.    SCCM Reference    @[email or web address] (WBC:1,LACTATEP:1,BANDS:1)@    Recommendation: Place lactic acid with reflex order.    If the above lactate is GREATER than or EQUAL to 2, reflex Lactate due NO LATER THAN @ N/A (2-3 hours from initial elevated lactic acid)    ED Antibiotics given: None    Fluid Resuscitation (suggested 40ml/kg) amount given:  None    Additional Significant Events:  none    Assessment & Plan:      Nursing Physical Exam:  Heart: Requires Tele(Y/N)   yes    Lungs: O2(Y/N)  No but on for comfort, o2 remains above 95% w/o o2   Mode of Delivery NC    Mental Status AXO4    PAWS Score      CIWA Score      CTRS Score Risk to Harm Screening   Have you had any thoughts of harming yourself or others?: No  Is the Patient in the Emergency Room?: Yes  Is the patient under an active ECO or TDO or has the patient exhibited symptoms or disruptive behaviors that may require a psychiatric consult?: No    DASA Score       OUD Score      STROKE/POTENTIAL STROKE Patient: No  NIH SCORE      Plan Of Care:   Critcal Drips none    (needs 2 IVs if yes)  Reviewed for any ED Outstanding orders reviewed    Transport:    Isolation:  No orders of the defined types were placed in this encounter.    Patient was transported via wheelchair to room TU05 on monitor.    Transported by RN.    Patient's condition is stable.    Patient's valuables were given to patient.    Pain:Patient denies pain    Vital SignsL BP (!) 188/139   Pulse 110   Temp 98 F (36.7 C)   Resp 16   SpO2 99%      York Spaniel, RN    Electronically signed by  York Spaniel, RN at 10/19/2022  3:26 PM EST

## 2022-10-19 NOTE — ED Provider Notes (Signed)
Associated Order(s): Critical Care Management  Formatting of this note is different from the original.    [place]    Time of Arrival:   10/19/22 1313    Final diagnoses:   [R07.9] Chest pain, unspecified type (Primary)   [R06.02] Shortness of breath   [I10] Primary hypertension   [E03.9] Hypothyroidism, unspecified type     Medical Decision Making:      Differential Diagnosis:   Hypertensive urgency, hypertensive emergency, CHF, flash pulmonary edema, pneumothorax, dissection, ACS, pericarditis, myocarditis, other arrhythmia, other    Social Determinants of Health:  social factors reviewed, did not limit treatment                               Supplemental Historians include:  patient    ED Course:   Patient Vitals for the past 24 hrs:   BP Temp Pulse Resp SpO2   10/19/22 1504 (!) 188/139 -- 110 16 99 %   10/19/22 1445 (!) 176/121 -- 108 22 100 %   10/19/22 1434 (!) 192/119 -- 102 15 100 %   10/19/22 1414 (!) 211/129 -- 105 15 100 %   10/19/22 1404 (!) 189/114 -- 101 16 100 %   10/19/22 1345 (!) 185/124 -- 102 17 100 %   10/19/22 1334 -- 98 F (36.7 C) -- -- --   10/19/22 1334 -- -- 102 19 100 %   10/19/22 1330 (!) 182/125 -- 98 20 100 %   10/19/22 1315 200/100 -- -- 16 99 %     Medications   nitroglycerin (Nitro-Bid) 2 % ointment 0.5 Inch (0.5 Inches Topical Given 10/19/22 1413)   labetalol (Normodyne) injection 10 mg (10 mg IV Push Given 10/19/22 1413)     CHEST PORTABLE   Final Result     1.  No acute cardiopulmonary disease.      Signed By: Mikki Santee, MD on 10/19/2022 2:57 PM       EKG 12 LEAD UNIT PERFORMED         Patient in no acute distress lying flat speaking in full sentences.  Very hypertensive on monitor.  Mildly tachycardic no acute respiratory distress.  Lungs clear.  Exam otherwise nonfocal.    Received full aspirin on arrival.  Currently chest pain-free  EKG shows normal sinus rhythm.  No significant changes from prior EKG    2:21 PM   D-dimer 0.22.  Low Wells score  doubt PE  Blood count stable  Chemistry stable  Troponin 7  Given IV labetalol    3:17 PM   Patient with some response to albuterol I gave her.  Her workup is reassuring with no signs of endorgan damage.  Troponin 7.  EKG showed normal sinus rhythm with no acute STEMI criteria or significant changes from prior.  Her chest x-ray does not show any focal pneumonia on my read.  Spoke with [facility] who will admit for further observation, cardiac testing        Documentation/Prior Results Review:  Old medical records, Nursing notes, Previous radiology studies  Reviewed cardiology note on 03/22/2021 in Care Everywhere.  History of CTA of chest demonstrated normal-sized heart and a nuclear stress test 07/02/2018 that was negative.  Echo 12/09/2021 showed EF of 50% with mild LVH.     Rhythm interpretation from monitor: sinus tachycardia    Imaging Interpreted by me: X-Ray as above    CHEST PORTABLE  Final Result     1.  No acute cardiopulmonary disease.      Signed By: Mikki Santee, MD on 10/19/2022 2:57 PM       EKG 12 LEAD UNIT PERFORMED         .     Discussion of Management with other Physicians, QHP or Appropriate Source:   Admitting team [facility]    Critical Care Management    Date/Time: 10/19/2022 3:20 PM    Authorized by: Reina Fuse, PA  Systems at Risk: Cardiac and Circulatory  Associated Problems: Hypertension and Arrhythmia  Procedures/Services: Venipuncture and CXR Interpretation  Management: Bedside management, Test review, Case discussion related to critical care, Case documentation and Record review  Total time for procedures/services: 1 (minutes)  Management Time: 29  Net Critical Care Time: 30  Procedures excluded from critical care time:ECG interpretation    Disposition:  Admit    New Prescriptions    No medications on file     Chief Complaint   Patient presents with    SHORTNESS OF BREATH    CHEST PAIN (ADULT)     Christy [name] is a 46 y.o. female   History of hypertension diabetes cardiac disease and angina  takes as needed nitro presents today for chest pain shortness of breath.  She also states she has been very hypertensive despite taking her atenolol, clonidine daily.  She went to urgent care today who referred here via rescue for the symptoms.  She was given 4 aspirin and rounds.  States she does not have any chest pain currently and feeling much better.  She denies any recent cough cold symptoms.  Denies any orthopnea.  She also has dyspnea on exertion.  Patient states she does have a history of coronary artery disease and catheterization however no stents.    SHORTNESS OF BREATH  CHEST PAIN (ADULT)      Review of Systems    Physical Exam  Vitals and nursing note reviewed.   Constitutional:       General: She is not in acute distress.  HENT:      Head: Normocephalic.   Eyes:      Conjunctiva/sclera: Conjunctivae normal.   Cardiovascular:      Rate and Rhythm: Normal rate and regular rhythm.      Heart sounds: Normal heart sounds.   Pulmonary:      Effort: Pulmonary effort is normal. No respiratory distress.      Breath sounds: Normal breath sounds. No wheezing or rales.   Abdominal:      Palpations: Abdomen is soft.      Tenderness: There is no abdominal tenderness.   Musculoskeletal:      Cervical back: Neck supple.      Right lower leg: No edema.      Left lower leg: No edema.   Skin:     General: Skin is warm.      Capillary Refill: Capillary refill takes less than 2 seconds.   Neurological:      Mental Status: She is alert and oriented to person, place, and time.     Past Medical History:   Diagnosis Date    Cardiac disorder     Cystitis, interstitial     Diabetes mellitus ([place])     Diabetic neuropathy ([place])     Hypertension      Past Surgical History:   Procedure Laterality Date    CHOLECYSTECTOMY      HYSTERECTOMY  TUBAL LIGATION       Family History   Problem Relation Age of Onset    Heart Disease Father     Other Family History Father     Diabetes Father     Cancer Father     Heart Disease Mother      Other Family History Mother     Diabetes Mother     Thyroid Disease Mother      Social History     Occupational History    Not on file   Tobacco Use    Smoking status: Every Day     Packs/day: 0.25     Years: 12.00     Additional pack years: 0.00     Total pack years: 3.00     Types: Cigarettes    Smokeless tobacco: Never    Tobacco comments:     tobacco cessation counselling done   Vaping Use    Vaping Use: Never used   Substance and Sexual Activity    Alcohol use: Yes     Comment: occasionally    Drug use: No     Comment: denies    Sexual activity: Not on file     No outpatient medications have been marked as taking for the 10/19/22 encounter [facility] Encounter).     Allergies   Allergen Reactions    Lisinopril cough    Vicodin [Hydrocodone-Acetaminophen] rash/itching    Penicillins unknown     Yeast infection      Vital Signs:  Patient Vitals for the past 72 hrs:   Temp Heart Rate Pulse Resp BP BP Mean SpO2   10/19/22 1504 -- 110 110 16 (!) 188/139 (!) 152 MM HG 99 %   10/19/22 1445 -- 108 108 22 (!) 176/121 (!) 135 MM HG 100 %   10/19/22 1434 -- 101 102 15 (!) 192/119 (!) 137 MM HG 100 %   10/19/22 1414 -- 106 105 15 (!) 211/129 (!) 151 MM HG 100 %   10/19/22 1404 -- 101 101 16 (!) 189/114 (!) 131 MM HG 100 %   10/19/22 1345 -- 102 102 17 (!) 185/124 (!) 138 MM HG 100 %   10/19/22 1334 98 F (36.7 C) -- -- -- -- -- --   10/19/22 1334 -- 103 102 19 -- -- 100 %   10/19/22 1330 -- 101 98 20 (!) 182/125 (!) 138 MM HG 100 %   10/19/22 1315 -- 100 -- 16 200/100 (!) 133 MM HG 99 %     Diagnostics:  Labs:    Results for orders placed or performed during the hospital encounter of 10/19/22   NT PROBNP   Result Value Ref Range    NT proBNP 157 (H) <=125 pg/mL   Troponin 0 HR   Result Value Ref Range    Troponin (T) Quant High Sensitivity (5th Gen) 7 0 - 19 ng/L   BASIC METABOLIC PANEL   Result Value Ref Range    Potassium 4.5 3.5 - 5.5 mmol/L    Sodium 140 133 - 145 mmol/L    Chloride 104 98 - 110 mmol/L    Glucose 127  (H) 70 - 99 mg/dL    Calcium 9.3 8.4 - 10.5 mg/dL    BUN 13 6 - 22 mg/dL    Creatinine 0.8 0.5 - 1.2 mg/dL    CO2 22 20 - 32 mmol/L    eGFR >60.0 >60.0 mL/min/1.73 sq.m.    Anion Gap 14.0 3.0 -  15.0 mmol/L   CBC WITH DIFFERENTIAL AUTO   Result Value Ref Range    WBC 8.2 4.0 - 11.0 K/uL    RBC 4.33 3.80 - 5.20 M/uL    HGB 12.4 11.7 - 16.0 g/dL    HCT 37.8 35.1 - 48.0 %    MCV 87 80 - 99 fL    MCH 29 26 - 34 pg    MCHC 33 31 - 36 g/dL    RDW 13.2 10.0 - 15.5 %    Platelet 253 140 - 440 K/uL    MPV 11.4 9.0 - 13.0 fL    Segmented Neutrophils (Auto) 53 40 - 75 %    Lymphocytes (Auto) 41 20 - 45 %    Monocytes (Auto) 4 3 - 12 %    Eosinophils (Auto) 2 0 - 6 %    Basophils (Auto) 0 0 - 2 %    Absolute Neutrophils (Auto) 4.4 1.8 - 7.7 K/uL    Absolute Lymphocytes (Auto) 3.3 1.0 - 4.8 K/uL    Absolute Monocytes (Auto) 0.3 0.1 - 1.0 K/uL    Absolute Eosinophils (Auto) 0.2 0.0 - 0.5 K/uL    Absolute Basophils (Auto) 0.0 0.0 - 0.2 K/uL   D-DIMER QUANTITATIVE   Result Value Ref Range    D-Dimer 0.22 0.00 - 1.12 mg/L FEU     ECG:  Results for orders placed or performed during the hospital encounter of 10/19/22   EKG 12 LEAD UNIT PERFORMED   Result Value Ref Range Status    Heart Rate 98 bpm Preliminary    RR Interval 612 ms Preliminary    Atrial Rate 97 ms Preliminary    P-R Interval 168 ms Preliminary    P Duration 105 ms Preliminary    P Horizontal Axis 54 deg Preliminary    P Front Axis 65 deg Preliminary    Q Onset 499 ms Preliminary    QRSD Interval 81 ms Preliminary    QT Interval 358 ms Preliminary    QTcB 458 ms Preliminary    QTcF 422 ms Preliminary    QRS Horizontal Axis -51 deg Preliminary    QRS Axis 15 deg Preliminary    I-40 Front Axis 42 deg Preliminary    t-40 Horizontal Axis -71 deg Preliminary    T-40 Front Axis -30 deg Preliminary    T Horizontal Axis 70 deg Preliminary    T Wave Axis 79 deg Preliminary    S-T Horizontal Axis 77 deg Preliminary    S-T Front Axis 80 deg Preliminary    Impression - NORMAL ECG -   Preliminary    Impression SR-Sinus rhythm-normal P axis, V-rate 50-99  Preliminary     Medications ordered/given in the ED  Medications   nitroglycerin (Nitro-Bid) 2 % ointment 0.5 Inch (0.5 Inches Topical Given 10/19/22 1413)   labetalol (Normodyne) injection 10 mg (10 mg IV Push Given 10/19/22 1413)       Electronically signed by Reginia Naas, MD at 10/19/2022  5:26 PM EST

## 2022-10-19 NOTE — ED Notes (Signed)
Formatting of this note might be different from the original.  EKG done and handed to provider, transferred on machine but still says one due.   Electronically signed by York Spaniel, RN at 10/19/2022  3:27 PM EST

## 2022-10-19 NOTE — ED Triage Notes (Signed)
Formatting of this note is different from the original.  Pt arrives via rescue from [facility] with intermittent CP and SOB since this AM  Pt did receive 4 ASA in route    Past Medical History:   Diagnosis Date    Cardiac disorder     Cystitis, interstitial     Diabetes mellitus ([place])     Diabetic neuropathy ([place])     Hypertension        Electronically signed by Caswell Corwin, RN at 10/19/2022  1:15 PM EST

## 2022-10-19 NOTE — Telephone Encounter (Signed)
This is in response to a request for an appt w/NP S. Carbullido.   I contacted the patient, she stated that she needs to check her blood pressure because it is very high.   Also stated that she had called the office of Dr. Noreene Larsson, and since this provider is no longer with that office (PCP) "nobody returns my call" , she also stated that she is trying to get in-touch with cardiology and nobody calls her back.     I advised to keep trying calling PCP and CARDIO office because her blood pressure any other medical issues regarding her health should be handle by those offices.    I stated that I was responding to her message requesting appt with NP S. Carbullido.  She stated "I don't know that provider" I refreshed her mind and I stated that she saw NP. Carbullido in 11/21/2021 for weight loss and she is required to have nicotine test prior to schedule an appt.      The patient responded  "I don't need your office"    [facility]

## 2022-10-19 NOTE — ED Notes (Signed)
Formatting of this note is different from the original.     10/19/22 1339   Lab Draw   Lab Draw Time 1334   Lab Draw Site Left;Antecubital   Lab Draw Device Angiocatheter   Device Gauge 20   # of Lab Draw Attempts 1 Attempt   Labs Drawn with IV Start Yes   Lab Tubes Lavender;Blue;Green;SST       Electronically signed by Vista Lawman, RN at 10/19/2022  1:40 PM EST

## 2022-10-19 NOTE — ED Notes (Signed)
Formatting of this note might be different from the original.  In addition to CP and SOB, pt endorses dizziness. Pt is AXO4, NAD noted and neuro unremarkable.  Electronically signed by York Spaniel, RN at 10/19/2022  1:36 PM EST

## 2022-10-19 NOTE — H&P (Signed)
Formatting of this note is different from the original.  Images from the original note were not included.      [facility] PROVIDER    Initial Evaluation Note    Patient is appropriate for and is admitted to the Short Stay Unit for treatment of HTN and chest pain not fully responsive to ED care and with high risk features.  Vital signs, clinical history, medications, allergies, family history and social history reviewed and/or discussed with referring provider.  Patient will benefit from a period of Observational care for further.  Reassessment of therapy will be focused on clinical course.  Based on the response to interventions in the [facility], a determination of safety for home and outpatient follow up versus need for inpatient care will be made.  Orders have been written and full History and Physical to follow.    Admission History and Physical    IMPRESSION/PLAN     Hypertension   Refractory to at home meds   Improvement after IV labetalol  Plan to continue at home meds, add Imdur and hydralazine as needed  No evidence of endorgan damage    Tachycardia  Has been worked up by cardiology multiple times  TSH elevated 15.05  Most recently had Holter monitor in November of this year  09/04/2022 5:12 PM EST   Patient monitored for 2d 10h, analyzable time was 2d 10h starting on  07/28/2022 08:02 pm. Primary rhythm was Sinus tachycardia. Average heart  rate was 109 bpm, Minimum heart rate was 97 bpm on Day 3 / 02:32:56 am,  Max heart rate was 138 bpm on Day 3 / 04:54:08 pm Patient recorded 2  events during the monitoring period  Plan to continue beta blocker (on atenolol at home), monitor on tele    Chest pain  Likely secondary to HTN  Trop normal   Dimer negative   BNP 157  Cxray without acute process  EKG sinus tachy  NST in AM if pain does not resolve with adequate BP control   Tele    Elevated TSH  Acquired hypothyroidism, prior thyroidectomy  Non-complaint with medication, pt states she does not even take medication once a  week most days because she has to many medications to take.   Screen free T4 and T3    Depression  Pt endorses depression and would like to speak with someone   States her life is too hard and she takes to many medications and she would rather not take them so she can stop living - agreeable to take meds here  Tele psych consulted    Migraine   IV migraine cocktail  Likely related to elevated BP    Resume OP medications for chronic stable medical conditions  Further management based on clinical course and test results.    PROPHYLAXIS:  DVT:  Heparin and SCDs    Admit to [facility] for further evaluation and management of the above.    Code status: full    Discussed with patient and Dr. Allena Katz.  Updates regarding management, prognosis, treatment and complications of the above medical conditions discussed in detail.  All questions answered to the satisfaction of those individual(s) who also verbalized understanding of and agreement with the assessment and plan.    Patient Status:  observation    Estimated date of discharge:  1-2 days  Disposition plan:  TBD    HPI     Chief Complaint   Patient presents with    SHORTNESS OF BREATH  CHEST PAIN (ADULT)     HPI  Christy [name] is a 46 y.o. female with PMHx as noted below who is being admitted for chest pain and hypertension.  Patient has complex history of poorly controlled hypertension, currently on 3 at home medications which she took all of them today however BP is elevated.  Patient also endorses chest pain, locates it to be in the center of her chest, describes as pressure. States has been on and off for a while. Has had stress test before that was normal.  Also has history of persistent tachycardia, states she has been evaluated by multiple doctors for her tachycardia and blood pressure and no one can help her.  Patient also endorses feeling depressed, states she does not want to take her medications anymore if that is going to make her die faster by not taking them that  is what she is going to do.  States she does not take her thyroid medication, does not enjoy taking this many medications and would prefer not to take any.  Patient also now endorses headache since she has been here.  Denies visual changes.  States it feels like a migraine which she gets often, states Tylenol and ibuprofen do not help her migraine, the only thing that helps is a  migraine cocktail and sleep.    PAST HISTORY     Past Medical History:   Diagnosis Date    Cardiac disorder     Cystitis, interstitial     Diabetes mellitus (HCC)     Diabetic neuropathy ([place])     Hypertension      Past Surgical History:   Procedure Laterality Date    CHOLECYSTECTOMY      HYSTERECTOMY      TUBAL LIGATION       Social History     Socioeconomic History    Marital status: Divorced     Spouse name: Not on file    Number of children: Not on file    Years of education: Not on file    Highest education level: Not on file   Occupational History    Not on file   Tobacco Use    Smoking status: Every Day     Packs/day: 0.25     Years: 12.00     Additional pack years: 0.00     Total pack years: 3.00     Types: Cigarettes    Smokeless tobacco: Never    Tobacco comments:     tobacco cessation counselling done   Vaping Use    Vaping Use: Never used   Substance and Sexual Activity    Alcohol use: Yes     Comment: occasionally    Drug use: No     Comment: denies    Sexual activity: Not on file   Other Topics Concern    Back Care Not Asked    Bike Helmet Not Asked    Blood Transfusions Not Asked    Caffeine Concern Not Asked    Counseling Not Asked    Depression Concerns Not Asked    Depression Screening Not Asked    Exercise Not Asked    [place] Hazards Not Asked    Military Service Not Asked    Occupational Exposure Not Asked    Seat Belt Not Asked    Self-Exams Not Asked    Sleep Concern Not Asked    Smoke Detectors Not Asked    Smoking Concerns Not Asked  Smoking Cessation Not Asked    Special Diet Not Asked    Stress Concern Not Asked     Weight Concern Not Asked   Social History Narrative    Not on file     Social Determinants of Health     Financial Resource Strain: Not on file   Food Insecurity: Not on file   Transportation Needs: Not on file   Physical Activity: Not on file   Stress: Not on file   Social Connections: Not on file   Intimate Partner Violence: Not on file   Housing Stability: Not on file     Family History   Problem Relation Age of Onset    Heart Disease Father     Other Family History Father     Diabetes Father     Cancer Father     Heart Disease Mother     Other Family History Mother     Diabetes Mother     Thyroid Disease Mother      HOME MEDICATIONS     Outpatient Medications Marked as Taking for the 10/19/22 encounter [facility](Hospital Encounter)   Medication Sig Dispense Refill    aspirin 81 mg PO CHEW Take 1 Tab by Mouth Once a Day.      atenoloL (TENORMIN) 50 mg PO TABS Take 1 Tab by Mouth Once a Day.      atorvastatin (LIPITOR) 40 mg PO TABS Take 1 Tab by Mouth Every Night at Bedtime.      cloNIDine HCL (CATAPRES) 0.1 mg PO TABS Take 3 Tabs by Mouth Twice Daily.      dapagliflozin propanediol (FARXIGA) 10 mg PO TABS Take 1 Tab by Mouth Once a Day.      gabapentin (NEURONTIN) 300 mg PO CAPS Take 1 Cap by Mouth 3 Times Daily.      linaCLOtide (LINZESS) 145 mcg PO CAPS Take 1 Cap by Mouth Once a Day.      lurasidone (LATUDA) 40 mg PO TABS Take 1 Tab by Mouth Once a Day.      olmesartan (BENICAR) 20 mg PO TABS Take 2 Tabs by Mouth Once a Day.      pantoprazole (PROTONIX) 40 mg PO TBEC Take 1 Tab by Mouth Twice Daily.      PARoxetine (PAXIL) 20 mg PO TABS Take 2 Tabs by Mouth Once a Day.      traZODone (DESYREL) 50 mg PO TABS Take 1 Tab by Mouth Daily as needed for Other.       ALLERGIES     Allergies   Allergen Reactions    Lisinopril cough    Vicodin [Hydrocodone-Acetaminophen] rash/itching    Penicillins unknown     Yeast infection      REVIEW OF SYSTEMS     Review of Systems (Bolded are positive, otherwise  negative)  Constitutional:  fever and chills, weight loss, fatigue, malaise, generalized weakness  HENT: congestion, sore throat, head injury  Eyes: vision abnormality  Respiratory: cough, shortness of breath, sputum production, wheezing, hemoptysis  Cardiovascular: chest pain, palpitations, leg edema, orthopnea  Gastrointestinal: nausea, vomiting, abdominal pain, diarrhea, constipation, blood in stools, reflux , blood in vomit  Genitourinary: dysuria and frequency, hematuria  Musculoskeletal: myalgias, joint pain.   Skin: rash and itching.   Neurological: sensory change, focal weakness, headaches.   Hematology: bruising, bleeding  Psychiatric/Behavioral: depression, anxiety     PHYSICAL ASSESSMENT     BP 147/84   Pulse 117   Temp 98.5 F (36.9 C)  Resp 20   Ht  (1.549 m)   Wt 96.2 kg (212 lb)   SpO2 97%   BMI 40.06 kg/m     No intake or output data in the 24 hours ending 10/19/22 1725    EXAM:  CONSTITUTIONAL:  Well developed, nourished, in no acute distress, awake, alert and oriented to person, place and time.  HEENT:  Atraumatic, nose normal, normocephalic, oropharynx clear and moist, conjunctiva normal, anicteric sclera.    NECK:  ROM normal, trachea normal  PULMONARY/CHEST WALL:  Respiratory effort normal. No wheezing  CARDIOVASCULAR:  normal rate, regular rhythm. No edema  ABDOMINAL:  soft, non-tender and non-distended. No peritoneal signs.  MUSCULOSKELETAL:  Normal ROM and development  SKIN:  Skin is warm and dry.  NEUROLOGICAL:  CN II-XII are grossly intact.  Non-focal.    PSYCH:  elated, angry affect at first then turned sad and depressed.  LABS     CBC w/Diff    Recent Labs     10/19/22  1338   WBC 8.2   RBC 4.33   HEMOGLOBIN 12.4   HCT 37.8   MCV 87   MCH 29   MCHC 33   RDW 13.2   PLATELET 253   MPV 11.4    Recent Labs     10/19/22  1338   SEGS 53   LYMPHOCYTES 41   MONOS 4   EOS 2   BASOS 0   RDW 13.2       Basic Metabolic Profile   Recent Labs     10/19/22  1338   NA 140   POTASSIUM 4.5    CHLORIDE 104   CO2 22   BUN 13   CREAT 0.8   GLUCOSE 127*   CALCIUM 9.3       STUDIES     EKG 10/19/2022   Independently reviewed and interpreted.  Results for orders placed or performed during the hospital encounter of 10/19/22   EKG 12 LEAD UNIT PERFORMED   Result Value Ref Range Status    Heart Rate 98 bpm Final    RR Interval 612 ms Final    Atrial Rate 97 ms Final    P-R Interval 168 ms Final    P Duration 105 ms Final    P Horizontal Axis 54 deg Final    P Front Axis 65 deg Final    Q Onset 499 ms Final    QRSD Interval 81 ms Final    QT Interval 358 ms Final    QTcB 458 ms Final    QTcF 422 ms Final    QRS Horizontal Axis -51 deg Final    QRS Axis 15 deg Final    I-40 Front Axis 42 deg Final    t-40 Horizontal Axis -71 deg Final    T-40 Front Axis -30 deg Final    T Horizontal Axis 70 deg Final    T Wave Axis 79 deg Final    S-T Horizontal Axis 77 deg Final    S-T Front Axis 80 deg Final    Impression - NORMAL ECG -  Final    Impression SR-Sinus rhythm-normal P axis, V-rate 50-99  Final    Impression NSC-No significant change since prior tracing-  Final     CXR 10/19/2022    Independently reviewed and interpreted.    Recent Results (from the past 36 hour(s))   1. CHEST PORTABLE    Narrative    EXAM: CHEST PORTABLE  CLINICAL INDICATION/HISTORY: CHEST PAIN     COMPARISON: CR chest 09/06/2022    FINDINGS: No evidence of focal infiltrates effusions or edema. Cardiac silhouette, osseous structures soft tissues unremarkable.     Impression    1.  No acute cardiopulmonary disease.     Signed By: Jani Files, MD on 10/19/2022 2:57 PM    2. EKG 12 LEAD UNIT PERFORMED   Result Value Ref Range    Heart Rate 98 bpm    RR Interval 612 ms    Atrial Rate 97 ms    P-R Interval 168 ms    P Duration 105 ms    P Horizontal Axis 54 deg    P Front Axis 65 deg    Q Onset 499 ms    QRSD Interval 81 ms    QT Interval 358 ms    QTcB 458 ms    QTcF 422 ms    QRS Horizontal Axis -51 deg    QRS Axis 15 deg    I-40 Front Axis 42  deg    t-40 Horizontal Axis -71 deg    T-40 Front Axis -30 deg    T Horizontal Axis 70 deg    T Wave Axis 79 deg    S-T Horizontal Axis 77 deg    S-T Front Axis 80 deg    Impression - NORMAL ECG -     Impression SR-Sinus rhythm-normal P axis, V-rate 50-99     Impression NSC-No significant change since prior tracing-      Bethany M Gallucci, PA-C  10/19/2022, 8:10 PM    I spent  70  minutes on patient including direct patient care, discussion with family, review of labs and imaging, discussion with consultants and plan for further treatment or diagnostics    Electronically signed by Andres Labrum, MD at 10/20/2022  2:24 PM EST    Associated attestation - Andres Labrum, MD - 10/20/2022  2:24 PM EST  Formatting of this note might be different from the original.  I personally performed a substantive portion of the care of this patient to include the medical decision making and formulation of the assessment and plan. I agree with the findings except as I have noted.     My independent encounter is notable for   Hypertension  Chest pain  Home meds  NST    Tachycardia  Has been evaluated by cardiology  Monitor    Abnl TSH  Pt states she does nto take her synthroid every day  Encourage pt to take her medication daily  Recheck TSH in 4 weeks    Depression  Psych consult  Will change paxil to zoloft and increase Latuda as rec'd by psych   Pt to follow up with psychiatrist this week  Pt to find psychologist for counseling    Migraine  Continue current POC    Andres Labrum, MD
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
## 2022-10-20 NOTE — Case Communication (Signed)
Formatting of this note might be different from the original.   ICM made the attempt to see the pt . The pt was off the unit for a stress test.     1315  ICM met with the pt at the bedside.  The pi is a 46 y/o female admitted with chest pain and SOB. Demos and PCP verified.  DME at home: walker,  cane used PRN.     Pharmacy:  [facility] [address]    Pt will arrange transport upon discharge    [place]. Brunson MSN RN CCM   Inpatient Case Manager [place]    During  the tme of interaction staff dress out with surgical mask/ face shield and gloves.    Electronically signed by Karren Cobble, RN at 10/20/2022  3:11 PM EST

## 2022-10-20 NOTE — Care Plan (Signed)
Formatting of this note might be different from the original.    Problem: Adult Inpatient Plan of Care  Goal: Plan of Care Review  Outcome: Progressing  Goal: Patient-Specific Goal (Individualized)  Outcome: Progressing  Goal: Absence of Hospital-Acquired Illness or Injury  Outcome: Progressing  Goal: Optimal Comfort and Wellbeing  Outcome: Progressing  Goal: Readiness for Transition of Care  Outcome: Progressing    Problem: Fall Prevention  Goal: Prevent/Manage Accidental Injury (Falls)  Description: Consider requesting a pharmacologic review as needed.  Consider asking the MD to consult PT / OT for an evaluation.  Outcome: Progressing    Electronically signed by Marcina Millard, RN at 10/20/2022  1:57 PM EST

## 2022-10-20 NOTE — Procedures (Signed)
Formatting of this note is different from the original.  NON-INVASIVE CARDIOLOGY NOTE   Date: 10/20/2022  Time: 10:25 AM  Test Performed: Carlton Adam Nuclear Stress Test  Pharmacological Intervention:  Lexiscan 0.4 mg; aminophylline 50mg /43mL  Stress Symptoms:  Shortness of breath  Arrythmias: Absent    Patient can eat/drink and take medications if NO other tests are ordered.      Final EKG and nuclear imaging interpretation are per the reading Cardiologist. Please see results review for final imaging study findings.     Abe People, BSN, RN  Noninvasive Cardiology Lab  [facility]  [phone]    Electronically signed by Abe People, RN at 10/20/2022 10:43 AM EST

## 2022-10-20 NOTE — Progress Notes (Signed)
Formatting of this note might be different from the original.  Ready for d/c.   IV removed.   Reviewed discharge instructions including new meds, side effects of meds and importance of follow up appt.   Given the opportunity to ask questions and have them answered.   Prescriptions reviewed  Belongings verified  Pt leaving with taxi  Electronically signed by Nani Skillern, [place] at 10/20/2022  3:56 PM EST

## 2022-10-20 NOTE — Case Communication (Signed)
Formatting of this note might be different from the original.  Patient is transitioning today to  home. Patient is being transported to this location by family. Patient and caregiver agree with this transition plan. Discharge orders, summary and medication reconciliation    .    Pt is clear to discharge    Sheika S. Brunson MSN RN CCM   Inpatient Case Manager [place]    During  the tme of interaction staff dress out with surgical mask/ face shield and gloves.    Electronically signed by Karren Cobble, RN at 10/20/2022  3:13 PM EST

## 2022-10-20 NOTE — Care Plan (Signed)
Formatting of this note might be different from the original.    Problem: Adult Inpatient Plan of Care  Goal: Plan of Care Review  Outcome: Progressing  Flowsheets (Taken 10/20/2022 0052)  Plan of Care Reviewed With: patient    Electronically signed by Marcelyn Bruins, RN at 10/20/2022 12:52 AM EST

## 2022-10-20 NOTE — Progress Notes (Signed)
Formatting of this note might be different from the original.      [facility]    Notice of Hospitalization    Date:  10/20/2022    To Whom It May Concern:    Christy [name] was hospitalized from 10/19/2022 to 10/20/2022.  Christy [name] may return to work 10/24/22 no restrictions.    Sincerely,    Nani Skillern, RN        Electronically signed by Nani Skillern, RN at 10/20/2022  3:57 PM EST

## 2022-11-20 NOTE — Telephone Encounter (Signed)
PCP: Fayne Norrie, DO    Last appt:  05/03/2022   No future appointments.    Requested Prescriptions     Pending Prescriptions Disp Refills    atenolol (TENORMIN) 50 MG tablet [Pharmacy Med Name: ATENOLOL 50 MG TABLET] 45 tablet 7     Sig: TAKE 1 AND 1/2 TABLETS BY MOUTH EVERY DAY

## 2022-11-23 MED ORDER — LEVOTHYROXINE SODIUM 200 MCG PO TABS
200 MCG | ORAL_TABLET | Freq: Every day | ORAL | 1 refills | Status: DC
Start: 2022-11-23 — End: 2024-04-17

## 2022-11-23 NOTE — Telephone Encounter (Signed)
[facility] refill fax request:    Requested Prescriptions     Pending Prescriptions Disp Refills    levothyroxine (SYNTHROID) 200 MCG tablet 30 tablet      Sig: Take 1 tablet by mouth every morning (before breakfast)     Pt is on wait list for Dr. Alveta Heimlich.

## 2022-11-27 MED ORDER — ATENOLOL 50 MG PO TABS
50 MG | ORAL_TABLET | Freq: Every day | ORAL | 3 refills | Status: DC
Start: 2022-11-27 — End: 2023-01-05

## 2022-11-27 NOTE — Telephone Encounter (Signed)
Verbal order and read back per Harlin Heys, APRN - NP  No significant events, only sinus tachycardia     This has been fully explained to the patient, who indicates understanding.

## 2022-11-27 NOTE — Telephone Encounter (Signed)
-----  Message from Brooks Sailors, DO sent at 11/17/2022  8:28 AM EST -----  No significant events, only sinus tachycardia    ----- Message -----  From: Royanne Foots, MA  Sent: 11/16/2022   9:22 AM EST  To: Brooks Sailors, DO    Please review for me    ----- Message -----  From: Royanne Foots, MA  Sent: 09/11/2022   9:55 AM EST  To: Gypsy Decant, MD    Per your note "  Primary hypertension, controlled BP in the office today.  Started metoprolol 100 mg twice daily at prior visit.  She stopped the metoprolol because of  leg swelling.  The swelling improved.  She was prescribed clonidine 0.3 mg twice daily and takes 1 at night.  .  Increased the  atenolol to 75 mg daily at prior visit.  She is still having palpitations at times so she was told to take aN extra half tab of the atenolol if she had prolonged palpitations.  Return in 8 weeks

## 2022-11-27 NOTE — Telephone Encounter (Signed)
Appt was made for 01-05-23 with Dr. Brundidge Pink

## 2022-12-12 ENCOUNTER — Encounter
Admit: 2022-12-12 | Discharge: 2022-12-12 | Payer: PRIVATE HEALTH INSURANCE | Attending: General Practice | Primary: Family Medicine

## 2022-12-12 ENCOUNTER — Inpatient Hospital Stay: Admit: 2022-12-12 | Payer: PRIVATE HEALTH INSURANCE | Primary: General Practice

## 2022-12-12 ENCOUNTER — Encounter: Admit: 2022-12-12 | Discharge: 2022-12-12 | Payer: PRIVATE HEALTH INSURANCE | Primary: General Practice

## 2022-12-12 DIAGNOSIS — E119 Type 2 diabetes mellitus without complications: Secondary | ICD-10-CM

## 2022-12-12 LAB — AMB POC HEMOGLOBIN A1C: Hemoglobin A1C, POC: 7.7 %

## 2022-12-12 MED ORDER — FLUCONAZOLE 150 MG PO TABS
150 | ORAL_TABLET | ORAL | 1 refills | Status: DC
Start: 2022-12-12 — End: 2023-01-05

## 2022-12-12 MED ORDER — OLMESARTAN MEDOXOMIL 40 MG PO TABS
40 MG | ORAL_TABLET | Freq: Every day | ORAL | 3 refills | Status: DC
Start: 2022-12-12 — End: 2023-04-09

## 2022-12-12 MED ORDER — DULAGLUTIDE 4.5 MG/0.5ML SC SOPN
4.5 | SUBCUTANEOUS | 3 refills | Status: AC
Start: 2022-12-12 — End: ?

## 2022-12-12 MED ORDER — SULFAMETHOXAZOLE-TRIMETHOPRIM 800-160 MG PO TABS
800-160 | ORAL_TABLET | Freq: Two times a day (BID) | ORAL | 0 refills | Status: AC
Start: 2022-12-12 — End: 2022-12-19

## 2022-12-12 MED ORDER — ATORVASTATIN CALCIUM 40 MG PO TABS
40 MG | ORAL_TABLET | ORAL | 3 refills | Status: DC
Start: 2022-12-12 — End: 2024-01-22

## 2022-12-12 MED ORDER — GABAPENTIN 300 MG PO CAPS
300 MG | ORAL_CAPSULE | Freq: Three times a day (TID) | ORAL | 2 refills | Status: DC
Start: 2022-12-12 — End: 2023-03-12

## 2022-12-12 MED ORDER — DAPAGLIFLOZIN PROPANEDIOL 10 MG PO TABS
10 MG | ORAL_TABLET | Freq: Every day | ORAL | 3 refills | Status: DC
Start: 2022-12-12 — End: 2024-01-22

## 2022-12-12 MED ORDER — METFORMIN HCL 1000 MG PO TABS
1000 MG | ORAL_TABLET | ORAL | 3 refills | Status: DC
Start: 2022-12-12 — End: 2024-01-22

## 2022-12-12 NOTE — Progress Notes (Signed)
Christy [name] is a 47 y.o. female (DOB: 05-Jul-1976) presenting to address:    Chief Complaint   Patient presents with    Establish Care     Possible yeast infection per pt; c/o discharge, itching       Vitals:    12/12/22 1351   BP: (!) 140/88   Pulse: (!) 113   Resp: 16   Temp: 98.1 F (36.7 C)   SpO2: 98%       "Have you been to the ER, urgent care clinic since your last visit?  Hospitalized since your last visit?"    YES - When: approximately 1 months ago.  Where and Why: [facility] ER for chest pain; was admitted; stress test completed.    "Have you seen or consulted any other health care providers outside of [facility] since your last visit?"    NO       Have you had a mammogram?"   NO     "Have you had a pap smear?"    NO

## 2022-12-12 NOTE — Progress Notes (Signed)
Christy [name]      MR#: [id number]    HISTORY OF PRESENT ILLNESS  Christy [name]  is a 47 y.o.  female who presents to establish new PCP.    Ran out of some of her medications.  Due for labs.  Thinks she has yeast infection.     Doesn't like going to her psychiatry/counselor.  Not taking Latuda.      A1c 7.7  Metformin 99991111 BID, Trulicity XX123456, Farxiga 57m (no longer on Januvia)  On Lipitor   On ARB  Needs eye exam  Needs foot exam    Previous PCP: Dr. NNoreene Larsson  Psychiatry: PHeron Sabins    Past medical history:  Type 2 diabetes  Coronary artery disease seen in [facility]  Hypothyroidism  GERD  Hyperlipidemia  Hypertension  Migraine  Obesity  Degenerative disc disease  Bipolar on Latuda     Social:  Tobacco use: Every day, 1ppd x 20+ years   Alcohol use: Yes  Illicit drug use: Marijuana  [place]   Employment: [employer], [place]:  Colon cancer screening: Due in 2032  Breast cancer screening: Due  Cervical cancer screening: No longer has cervix   Lung cancer screening: At 50  COVID: PFruitvalex 3  Influenza: UTD  PCV: PPSV23 in 2020  Shingles: At 89     Family History   Problem Relation Age of Onset    Diabetes Mother     Heart Disease Mother     Diabetes Father     Cancer Sister     No Known Problems Brother     Heart Disease Maternal Aunt     No Known Problems Maternal Uncle     Cancer Paternal Aunt     No Known Problems Paternal Uncle     No Known Problems Maternal Grandmother     No Known Problems Maternal Grandfather     No Known Problems Paternal Grandmother     No Known Problems Paternal Grandfather     No Known Problems Other        Allergies   Allergen Reactions    Lisinopril Cough    Hydrocodone-Acetaminophen Itching    Hydrocodone     Lisinopril-Hydrochlorothiazide Other (See Comments)     Dry cough     Penicillins Other (See Comments)       Social History     Tobacco Use   Smoking Status Every Day    Current packs/day: 0.25    Types:  Cigarettes   Smokeless Tobacco Never       Social History     Substance and Sexual Activity   Alcohol Use Yes       Immunization History   Administered Date(s) Administered    COVID-19, PFIZER PURPLE top, DILUTE for use, (age 8330y+), 319m/0.3mL 03/31/2020, 04/21/2020, 11/12/2020    Influenza, FLUARIX, FLULAVAL, FLUZONE (age 2 67o+) AND AFLURIA, (age 36 48+), PF, 0.77m30m0/20/2021    Influenza, FLUCELVAX, (age 2 m13+), MDCK, PF, 0.77mL57m/25/2020, 07/21/2022    PPD Test 08/18/2020    Pneumococcal, PPSV23, PNEUMOVAX 23, 26ge 2y+), SC/IM, 0.77mL 67m23/2020    TDaP, ADACEL (age 830y-643y[place] 10y+), IM, 0.77mL 082m0/2019         Current Outpatient Medications:     fluconazole (DIFLUCAN) 150 MG tablet, Take 1 tablet by mouth every 72 hours, Disp: 6 tablet, Rfl: 1    metFORMIN (GLUCOPHAGE) 1000  MG tablet, TAKE 1 TABLET 2 TIMES DAILY (WITH MEALS) FOR 90 DAYS. INDICATIONS: TYPE 2 DIABETES MELLITUS, Disp: 180 tablet, Rfl: 3    atorvastatin (LIPITOR) 40 MG tablet, TAKE 1 TABLET BY MOUTH NIGHTLY FOR 90 DAYS. INDICATIONS: HIGH CHOLESTEROL, Disp: 90 tablet, Rfl: 3    Dulaglutide 4.5 MG/0.5ML SOPN, Inject 4.5 mg into the skin once a week Indications: Diabetes, Disp: 12 Adjustable Dose Pre-filled Pen Syringe, Rfl: 3    dapagliflozin (FARXIGA) 10 MG tablet, Take 1 tablet by mouth daily, Disp: 90 tablet, Rfl: 3    gabapentin (NEURONTIN) 300 MG capsule, Take 1 capsule by mouth 3 times daily for 90 days. Max Daily Amount: 900 mg, Disp: 90 capsule, Rfl: 2    olmesartan (BENICAR) 40 MG tablet, Take 1 tablet by mouth daily, Disp: 90 tablet, Rfl: 3    sulfamethoxazole-trimethoprim (BACTRIM DS;SEPTRA DS) 800-160 MG per tablet, Take 1 tablet by mouth 2 times daily for 7 days, Disp: 14 tablet, Rfl: 0    atenolol (TENORMIN) 50 MG tablet, Take 1 tablet by mouth daily, Disp: 90 tablet, Rfl: 3    levothyroxine (SYNTHROID) 200 MCG tablet, Take 1 tablet by mouth every morning (before breakfast), Disp: 90 tablet, Rfl: 1    hydrOXYzine pamoate  (VISTARIL) 50 MG capsule, TAKE 1 CAPSULE BY MOUTH 3 TIMES DAILY AS NEEDED FOR ANXIETY., Disp: 90 capsule, Rfl: 2    cloNIDine (CATAPRES) 0.3 MG tablet, TAKE 1 TABLET BY MOUTH TWO TIMES A DAY., Disp: 180 tablet, Rfl: 3    Lancets MISC, Use to test blood sugar once daily, Disp: , Rfl:     aspirin 81 MG EC tablet, Take 1 tablet by mouth daily, Disp: , Rfl:     linaclotide (LINZESS) 145 MCG capsule, Take 1 capsule by mouth every morning (before breakfast), Disp: , Rfl:     nitroGLYCERIN (NITROSTAT) 0.4 MG SL tablet, Place 1 tablet under the tongue every 5 minutes as needed, Disp: , Rfl:     pantoprazole (PROTONIX) 40 MG tablet, Take 1 tablet by mouth 2 times daily, Disp: , Rfl:     traZODone (DESYREL) 50 MG tablet, Take 1 tablet by mouth nightly, Disp: , Rfl:     buPROPion (WELLBUTRIN XL) 150 MG extended release tablet, TAKE 1 TABLET BY MOUTH IN THE MORNING AND IN THE EVENING (Patient not taking: Reported on 12/12/2022), Disp: 60 tablet, Rfl: 3    lurasidone (LATUDA) 40 MG TABS tablet, Take 1 tablet by mouth Daily with supper (Patient not taking: Reported on 12/12/2022), Disp: , Rfl:       Review of Systems   Constitutional:  Negative for appetite change, chills, fatigue, fever and unexpected weight change.   HENT:  Negative for congestion, rhinorrhea and sore throat.    Eyes:  Negative for pain.   Respiratory:  Negative for cough, chest tightness and shortness of breath.    Cardiovascular:  Negative for chest pain, palpitations and leg swelling.   Gastrointestinal:  Negative for abdominal pain, constipation, diarrhea, nausea and vomiting.   Genitourinary:  Negative for dysuria and frequency.   Musculoskeletal:  Negative for arthralgias, back pain and myalgias.   Skin:  Negative for rash and wound.   Neurological:  Negative for dizziness and weakness.       Physical Exam  Constitutional:       General: She is not in acute distress.     Appearance: Normal appearance. She is not ill-appearing or toxic-appearing.   HENT:  Head: Normocephalic and atraumatic.      Mouth/Throat:      Pharynx: No posterior oropharyngeal erythema.   Cardiovascular:      Rate and Rhythm: Normal rate and regular rhythm.      Pulses: Normal pulses.           Dorsalis pedis pulses are 2+ on the right side and 2+ on the left side.      Heart sounds: Normal heart sounds. No murmur heard.     No friction rub. No gallop.   Pulmonary:      Effort: Pulmonary effort is normal. No respiratory distress.      Breath sounds: Normal breath sounds. No wheezing, rhonchi or rales.   Abdominal:      General: Abdomen is flat. Bowel sounds are normal.      Palpations: Abdomen is soft.      Tenderness: There is no abdominal tenderness. There is no guarding.   Musculoskeletal:         General: Normal range of motion.      Cervical back: No tenderness.      Right foot: Normal range of motion. No deformity, bunion or prominent metatarsal heads.      Left foot: Normal range of motion. No deformity, bunion or prominent metatarsal heads.   Feet:      Right foot:      Protective Sensation: 5 sites tested.  5 sites sensed.      Skin integrity: Skin integrity normal. No ulcer, blister, skin breakdown, callus or dry skin.      Toenail Condition: Right toenails are normal.      Left foot:      Protective Sensation: 5 sites tested.  5 sites sensed.      Skin integrity: Skin integrity normal. No ulcer, blister, skin breakdown, callus or dry skin.      Toenail Condition: Left toenails are normal.   Skin:     General: Skin is warm.      Capillary Refill: Capillary refill takes less than 2 seconds.      Findings: Erythema present. No rash.      Comments: Folliculitis along pubis/pubic hair without discharge   Neurological:      General: No focal deficit present.      Mental Status: She is alert and oriented to person, place, and time. Mental status is at baseline.      Motor: No weakness.      Gait: Gait normal.   Psychiatric:         Mood and Affect: Mood normal.         Behavior: Behavior  normal.         Thought Content: Thought content normal.         Judgment: Judgment normal.          ASSESSMENT and PLAN    Christy [name] was seen today for establish care.    Diagnoses and all orders for this visit:    Type 2 diabetes mellitus without complication, without long-term current use of insulin (HCC)  -     AMB POC HEMOGLOBIN A1C  -     Lipid Panel; Future  -     Microalbumin / Creatinine Urine Ratio; Future  -     Comprehensive Metabolic Panel; Future  -     CBC with Auto Differential; Future  -     External Referral To Ophthalmology  -     metFORMIN (GLUCOPHAGE) 1000 MG tablet; TAKE  1 TABLET 2 TIMES DAILY (WITH MEALS) FOR 90 DAYS. INDICATIONS: TYPE 2 DIABETES MELLITUS  -     Dulaglutide 4.5 MG/0.5ML SOPN; Inject 4.5 mg into the skin once a week Indications: Diabetes  -     dapagliflozin (FARXIGA) 10 MG tablet; Take 1 tablet by mouth daily  -     gabapentin (NEURONTIN) 300 MG capsule; Take 1 capsule by mouth 3 times daily for 90 days. Max Daily Amount: 900 mg  -     HM DIABETES FOOT EXAM    Mixed hyperlipidemia  -     atorvastatin (LIPITOR) 40 MG tablet; TAKE 1 TABLET BY MOUTH NIGHTLY FOR 90 DAYS. INDICATIONS: HIGH CHOLESTEROL    Migraine with aura and without status migrainosus, not intractable    Coronary artery disease involving native coronary artery of native heart without angina pectoris    Primary hypertension  -     olmesartan (BENICAR) 40 MG tablet; Take 1 tablet by mouth daily    Tachycardia    HFrEF (heart failure with reduced ejection fraction) (HCC)    Hypothyroidism, unspecified type  -     TSH with Reflex; Future    Vitamin D deficiency  -     Vitamin D 25 Hydroxy; Future    Yeast infection  -     fluconazole (DIFLUCAN) 150 MG tablet; Take 1 tablet by mouth every 72 hours    Polyneuropathy  -     gabapentin (NEURONTIN) 300 MG capsule; Take 1 capsule by mouth 3 times daily for 90 days. Max Daily Amount: 900 mg    Abdominal discomfort in left upper quadrant  -     CT ABDOMEN PELVIS WO CONTRAST  Additional Contrast? None; Future  -     Urinalysis; Future    Encounter for screening mammogram for breast cancer  -     MAM DIGITAL SCREEN W OR WO CAD BILATERAL; Future    Bacterial folliculitis  -     sulfamethoxazole-trimethoprim (BACTRIM DS;SEPTRA DS) 800-160 MG per tablet; Take 1 tablet by mouth 2 times daily for 7 days      # Diabetes with neuropathy -continues to be well-controlled, A1c 7.7, down from 8.  On ARB, on statin.  Continue current medicines refill Trulicity.  No longer taking Januvia.  Diabetic foot exam normal today.  Will refer to eye exam.  Refill gabapentin. diabetes labs today follow-up in 3 months.    # Hypertension, CAD, HFrEF -follows with Dr. Rogue Bussing.  Reviewed previous notes.  Mildly elevated today.  On aspirin, statin.  Continue current medications.    # Left upper quadrant abdominal pain -about 1 year of abdominal pain.  Painful and tenderness on exam today up underneath the left 11th and 12th ribs.  Possible related to nephrolithiasis versus colitis.  Due to quality of pain and chronicity of pain will complete CT abdomen and pelvis.  Check UA.    # Hypothyroidism -continue Synthroid, check TSH    # Vitamin D deficiency -check vitamin D    # Yeast infection -likely secondary to her diabetes, refill Diflucan    # Folliculitis -multiple ingrown hairs in pubis, felt likely secondary to her diabetes, start Bactrim, encouraged warm compresses.    #Bipolar vs MDD -no longer feels comfortable with her psychiatrist or counselor.  Previously on [place].  Previously on Paxil.  Currently taking hydroxyzine and trazodone.    #HM -     Plan of care has been discussed, patient agrees with plan; all  questions answered.   More than 50% of the time spent in this visit was counseling the patient about  illness and treatment options     Total time: 45 minutes was spent with the patient, reviewing labs, and discussed the nature of the chronic conditions.    Follow up in 3 months      Glade Stanford. Lennox Laity  [facility]     PLEASE NOTE:   This document has been produced using voice recognition software. Unrecognized errors in transcription may be present.

## 2022-12-13 ENCOUNTER — Encounter

## 2022-12-13 LAB — CBC WITH AUTO DIFFERENTIAL
Absolute Immature Granulocyte: 0.1 10*3/uL — ABNORMAL HIGH (ref 0.00–0.04)
Basophils %: 1 % (ref 0–2)
Basophils Absolute: 0.1 10*3/uL (ref 0.0–0.1)
Eosinophils %: 3 % (ref 0–5)
Eosinophils Absolute: 0.2 10*3/uL (ref 0.0–0.4)
Hematocrit: 39.6 % (ref 35.0–45.0)
Hemoglobin: 12.9 g/dL (ref 12.0–16.0)
Immature Granulocytes: 1 % — ABNORMAL HIGH (ref 0.0–0.5)
Lymphocytes %: 40 % (ref 21–52)
Lymphocytes Absolute: 3 10*3/uL (ref 0.9–3.6)
MCH: 29 PG (ref 24.0–34.0)
MCHC: 32.6 g/dL (ref 31.0–37.0)
MCV: 89 FL (ref 78.0–100.0)
MPV: 14.7 FL — ABNORMAL HIGH (ref 9.2–11.8)
Monocytes %: 4 % (ref 3–10)
Monocytes Absolute: 0.3 10*3/uL (ref 0.05–1.2)
Neutrophils %: 53 % (ref 40–73)
Neutrophils Absolute: 4 10*3/uL (ref 1.8–8.0)
Nucleated RBCs: 0 PER 100 WBC
Platelets: 254 10*3/uL (ref 135–420)
RBC: 4.45 M/uL (ref 4.20–5.30)
RDW: 13 % (ref 11.6–14.5)
WBC: 7.6 10*3/uL (ref 4.6–13.2)
nRBC: 0 10*3/uL (ref 0.00–0.01)

## 2022-12-13 LAB — COMPREHENSIVE METABOLIC PANEL
ALT: 30 U/L (ref 13–56)
AST: 11 U/L (ref 10–38)
Albumin/Globulin Ratio: 1 (ref 0.8–1.7)
Albumin: 3.6 g/dL (ref 3.4–5.0)
Alk Phosphatase: 99 U/L (ref 45–117)
Anion Gap: 6 mmol/L (ref 3.0–18)
BUN: 15 MG/DL (ref 7.0–18)
Bun/Cre Ratio: 16 (ref 12–20)
CO2: 24 mmol/L (ref 21–32)
Calcium: 9.3 MG/DL (ref 8.5–10.1)
Chloride: 109 mmol/L (ref 100–111)
Creatinine: 0.92 MG/DL (ref 0.6–1.3)
Est, Glom Filt Rate: >60 mL/min/{1.73_m2} (ref >60)
Globulin: 3.5 g/dL (ref 2.0–4.0)
Glucose: 198 mg/dL — ABNORMAL HIGH (ref 74–99)
Potassium: 3.8 mmol/L (ref 3.5–5.5)
Sodium: 139 mmol/L (ref 136–145)
Total Bilirubin: 0.2 MG/DL (ref 0.2–1.0)
Total Protein: 7.1 g/dL (ref 6.4–8.2)

## 2022-12-13 LAB — LIPID PANEL
Chol/HDL Ratio: 2.9 (ref 0–5.0)
Cholesterol, Total: 134 MG/DL (ref <200)
HDL: 47 MG/DL (ref 40–60)
LDL Calculated: 60.8 MG/DL (ref 0–100)
Triglycerides: 131 MG/DL (ref <150)
VLDL Cholesterol Calculated: 26.2 MG/DL

## 2022-12-13 LAB — URINALYSIS
Bilirubin Urine: NEGATIVE
Blood, Urine: NEGATIVE
Glucose, UA: >1000 mg/dL — AB
Ketones, Urine: NEGATIVE mg/dL
Leukocyte Esterase, Urine: NEGATIVE
Nitrite, Urine: NEGATIVE
Protein, UA: NEGATIVE mg/dL
Specific Gravity, UA: >1.03 — ABNORMAL HIGH (ref 1.005–1.030)
Urobilinogen, Urine: 0.2 EU/dL (ref 0.2–1.0)
pH, Urine: 5.5 (ref 5.0–8.0)

## 2022-12-13 LAB — TSH WITH REFLEX: TSH Cascade Panel: 0.38 u[IU]/mL (ref 0.36–3.74)

## 2022-12-13 LAB — MICROALBUMIN / CREATININE URINE RATIO
Creatinine, Ur: 77 mg/dL (ref 30–125)
Microalbumin Creatinine Ratio: 9 mg/g (ref 0–30)
Microalbumin, Random Urine: 0.71 MG/DL (ref 0–3.0)

## 2022-12-13 LAB — VITAMIN D 25 HYDROXY: Vit D, 25-Hydroxy: 25.3 ng/mL — ABNORMAL LOW (ref 30–100)

## 2022-12-13 MED ORDER — VITAMIN D (ERGOCALCIFEROL) 1.25 MG (50000 UT) PO CAPS
1.2550000 MG (50000 UT) | ORAL_CAPSULE | ORAL | 1 refills | Status: AC
Start: 2022-12-13 — End: 2023-07-23

## 2022-12-18 NOTE — ED Provider Notes (Signed)
Formatting of this note is different from the original.    [place]    Time of Arrival:   12/18/22 1900    Final diagnoses:   [R10.12] Left upper quadrant abdominal pain (Primary)     Medical Decision Making:      Differential Diagnosis:    Renal Colic, Nephrolithiasis, Gastritis, Infectious enteritis, appendicitis, mesenteric adenitis, pyelo, cholecystitis/cholangitis/biliary colic, GERD, GI bleed, diverticulitis, pancreatitis, hepatitis, bowel obstruction, endometriosis, constipation, ischemic colitis.     TOA [place] torsion/ Ovarian Cyst, ectopic, PID/Cervicitis    No hx, or  s/s on exam of AAA or acute abd vascular pathology which are both felt to be very unlikely    Social Determinants of Health:  social factors reviewed, did not limit treatment                       Glasgow Coma Scale Score: 15            Supplemental Historians include:  patient    ED Course: 47 y.o. female with left flank pain radiating to LUQ for months, worse over past couple weeks.    Patient is well-appearing, no acute distress.  Left upper quadrant tenderness on exam.  No CVA tenderness.  Will check labs, treat symptomatically, order CT, reassess.    CBC shows no leukocytosis or anemia.   BMP shows glucose 258, normal kidney function, normal electrolytes  Hepatic function shows normal LFTs, normal gap  Lipase is normal  Urinalysis shows no UTI    CT without acute findings. Stable adrenal adenoma.    Workup reassuring. Rx bentyl. GI follow up as needed. Strict return precautions.    Documentation/Prior Results Review:  Old medical records, Nursing notes    Rhythm interpretation from monitor: N/A    Imaging Interpreted by me: CT no acute findings    CT -  ABD/PELVIS-IV ONLY   Final Result   No acute findings.   Other findings as described above.     Signed By: [name], DO on 12/18/2022 8:48 PM         .     Discussion of Management with other Physicians, QHP or Appropriate Source:   None    .    Disposition:   Home  I discussed the above case with attending Dr. Elenore Rota and made aware of the patients case including but not limited to history, physical exam findings, lab and imaging results. In agreement with my treatment plan.    Discharge Medication List as of 12/18/2022  9:49 PM       START taking these medications    Details   dicyclomine (BENTYL) 20 mg PO TABS Take 1 Tab by Mouth 4 Times Daily As Needed (abdominal pain)., Disp-30 Tab, R-0, Normal         Chief Complaint   Patient presents with    Hood     Christy [name] is a 47 y.o. female presenting with left flank pain radiating to LUQ. Patient reports symptoms began "months" ago. Left lower back started months ago. Then worked its way around to LUQ. Saw new doctor last week. Ordered CT, has not had it scheduled. Known 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. "Feels like something moving inside her." Pain is getting worse. Flexeril, tylenol, ibuprofen without relief. No N/V/D. Feels like she gets full faster. Pain is worse when she has to urinate.  DM on Metformin, farxiga, trulicity.  Partial hysterecomy, cholecystectomy, laporscopy  Review of Systems    Physical Exam  Constitutional:       General: She is not in acute distress.     Appearance: Normal appearance.   HENT:      Head: Normocephalic and atraumatic.      Nose: Nose normal.      Mouth/Throat:      Mouth: Mucous membranes are moist.   Eyes:      Pupils: Pupils are equal, round, and reactive to light.   Cardiovascular:      Rate and Rhythm: Normal rate and regular rhythm.      Pulses: Normal pulses.      Heart sounds: Normal heart sounds. No murmur heard.     No friction rub. No gallop.   Pulmonary:      Effort: Pulmonary effort is normal. No respiratory distress.      Breath sounds: Normal breath sounds.   Abdominal:      General: Bowel sounds are normal. There is no distension.      Palpations: Abdomen is soft.      Tenderness: There is abdominal tenderness in the left upper quadrant. There is no right CVA  tenderness or left CVA tenderness.   Musculoskeletal:         General: Normal range of motion.   Skin:     General: Skin is warm and dry.      Findings: No erythema or rash.   Neurological:      General: No focal deficit present.      Mental Status: She is alert and oriented to person, place, and time.      Cranial Nerves: No cranial nerve deficit.   Psychiatric:         Mood and Affect: Mood normal.     Past Medical History:   Diagnosis Date    Cardiac disorder     Cystitis, interstitial     Diabetes mellitus ([place])     Diabetic neuropathy ([place])     Hypertension      Past Surgical History:   Procedure Laterality Date    CHOLECYSTECTOMY      HYSTERECTOMY      TUBAL LIGATION       Family History   Problem Relation Age of Onset    Heart Disease Father     Other Family History Father     Diabetes Father     Cancer Father     Heart Disease Mother     Other Family History Mother     Diabetes Mother     Thyroid Disease Mother      Social History     Occupational History    Not on file   Tobacco Use    Smoking status: Every Day     Packs/day: 0.25     Years: 12.00     Additional pack years: 0.00     Total pack years: 3.00     Types: Cigarettes    Smokeless tobacco: Never    Tobacco comments:     tobacco cessation counselling done   Vaping Use    Vaping Use: Never used   Substance and Sexual Activity    Alcohol use: Yes     Comment: occasionally    Drug use: No     Comment: denies    Sexual activity: Not on file     Outpatient Medications Marked as Taking for the 12/18/22 encounter [facility] Encounter)   Medication Sig Dispense Refill    dicyclomine (  BENTYL) 20 mg PO TABS Take 1 Tab by Mouth 4 Times Daily As Needed (abdominal pain). 30 Tab 0     Allergies   Allergen Reactions    Lisinopril cough    Vicodin [Hydrocodone-Acetaminophen] rash/itching    Penicillins unknown     Yeast infection      Vital Signs:  Patient Vitals for the past 72 hrs:   Temp Heart Rate Resp BP BP Mean SpO2 Weight   12/18/22 2104 -- 117 18 171/92 (!)  118 MM HG -- --   12/18/22 1940 98.3 F (36.8 C) 115 18 159/100 (!) 120 MM HG 99 % --   12/18/22 1939 -- -- -- -- -- -- 96.2 kg (212 lb)   12/18/22 1909 -- 126 -- -- -- 98 % --     Diagnostics:  Labs:    Results for orders placed or performed during the hospital encounter of 12/18/22   PREGNANCY URINE POC (LAB)   Result Value Ref Range    PREGNANCY URINE Negative Negative   URINALYSIS POC (LAB)   Result Value Ref Range    Urine pH 6.0 5.0 - 8.0 pH    Urine Protein Screen Negative Negative mg/dL    Urine Glucose 500* (A) Negative mg/dL    Urine Ketones Negative Negative mg/dL    Urine Occult Blood Trace (A) Negative    Urine Specific Gravity 1.020 1.003 - 1.030    Urine Nitrite Negative Negative    Urine Leukocyte Esterase Negative Negative    Urine Bilirubin Negative Negative    Urine Urobilinogen 0.2 0.2 - 1.0 mg/dL   Chem8, i-STAT (Lab)   Result Value Ref Range    POTASSIUM 3.6 3.5 - 5.5 mmol/L    CHLORIDE 106 98 - 110 mmol/L    CALCIUM IONIZED 5.0 4.4 - 5.4 mg/dL    CO2 20.0 20.0 - 32.0 mmol/L    Glucose 258 (H) 70 - 99 mg/dL    BUN 16 6 - 22 mg/dL    CREATININE 1.0 0.5 - 1.2 mg/dL    SODIUM 139 133 - 145 mmol/L    HGB 14.6 11.7 - 16.0 g/dL    HCT 43.0 35.1 - 48.0 %    POC-eGFR >60.0 >60.0    Cartridge type [id number]    BASIC METABOLIC PANEL   Result Value Ref Range    Potassium 3.8 3.5 - 5.5 mmol/L    Sodium 137 133 - 145 mmol/L    Chloride 103 98 - 110 mmol/L    Glucose 256 (H) 70 - 99 mg/dL    Calcium 9.2 8.4 - 10.5 mg/dL    BUN 16 6 - 22 mg/dL    Creatinine 1.0 0.5 - 1.2 mg/dL    CO2 19 (L) 20 - 32 mmol/L    eGFR >60.0 >60.0 mL/min/1.73 sq.m.    Anion Gap 15.0 3.0 - 15.0 mmol/L   HEPATIC FUNCTION PANEL   Result Value Ref Range    Albumin 4.4 3.5 - 5.0 g/dL    Total Protein 7.8 6.4 - 8.3 g/dL    Globulin 3.4 2.0 - 4.0 g/dL    A/G Ratio 1.3 1.1 - 2.6 ratio    Bilirubin Total 0.2 0.2 - 1.2 mg/dL    Bilirubin Direct <0.2 0.0 - 0.3 mg/dL    SGOT (AST) 15 10 - 37 U/L    Alkaline Phosphatase 99 25 - 115 U/L    SGPT  (ALT) 21 5 - 40 U/L   CBC WITH DIFFERENTIAL AUTO  Result Value Ref Range    WBC 9.6 4.0 - 11.0 K/uL    RBC 4.59 3.80 - 5.20 M/uL    HGB 13.1 11.7 - 16.0 g/dL    HCT 40.7 35.1 - 48.0 %    MCV 89 80 - 99 fL    MCH 29 26 - 34 pg    MCHC 32 31 - 36 g/dL    RDW 12.7 10.0 - 15.5 %    Platelet 310 140 - 440 K/uL    MPV 11.9 9.0 - 13.0 fL   LIPASE   Result Value Ref Range    Lipase 43 7 - 60 U/L   MANUAL DIFFERENTIAL   Result Value Ref Range    Segmented Neutrophils (Manual) 57 40 - 75 %    Lymphocytes (Manual) 39 20 - 45 %    Eosinophils (Manual) 3 0 - 6 %    Basophils (Manual) 1 0 - 2 %    Absolute Neutrophils (Manual) 5.5 1.8 - 7.7 K/uL    Absolute Lymphocytes (Manual) 3.7 1.0 - 4.8 K/uL    Absolute Eosinophils (Manual) 0.3 0.0 - 0.5 K/uL    Absolute Basophils (Manual) 0.1 0.0 - 0.2 K/uL    Normochromic RBC Normochromic     Normocytic RBC Normocytic     Total Cells Counted 100     Smear Evaluation Platelet morphology appears normal.    Urinalysis w Micro Reflex Culture    Specimen: Clean Catch Urine   Result Value Ref Range    Source Urine      Urine Color Yellow Colorless, Pale Yellow, Light Yellow, Yellow, Dark Yellow, Straw    Urine Clarity Clear Clear, Slightly Cloudy    Urine pH 5.5 5.0 - 8.0 pH    Urine Protein Screen Negative Negative, Trace mg/dL    Urine Glucose >=1000* (A) Negative mg/dL    Urine Ketones Negative Negative mg/dL    Urine Occult Blood Negative Negative    Urine Specific Gravity 1.045 (H) 1.005 - 1.030    Urine Nitrite Negative Negative    Urine Leukocyte Esterase Negative Negative    Urine Bilirubin Negative Negative    Urine Urobilinogen 0.2 <2.0 mg/dL mg/dL    Urine RBC 0-2 Negative, 0-2 /hpf    Urine WBC 0-2 0 - 5 /hpf    Urine Bacteria Negative Negative    Squamous Epithelial Cells 3-5 (A) None, 0-2 /hpf    Hyaline Cast 3-5 (A) 0 - 2 /lpf     ECG:  No results found for this visit on 12/18/22.    Medications ordered/given in the ED  Medications   ondansetron (PF) (Zofran) injection 4 mg (4 mg  Intravenous Given 12/18/22 2104)   morphine injection 4 mg (4 mg Intravenous Given 12/18/22 2104)   iohexol (OmniPaque) 350 mg iodine/mL solution 100 mL (100 mL Intravenous Given 12/18/22 2028)       Electronically signed by Arvella Merles, MD at 12/19/2022  7:46 PM EST    Associated attestation - Arvella Merles, MD - 12/19/2022  7:46 PM EST  Formatting of this note is different from the original.  ED  PA / NP Supervision Note    I personally performed a substantive portion of the care of this patient NEDDA FRIPP, to include the medical decision making and formulation of the assessment and plan. I agree with the findings except as I have noted.     Arvella Merles, MD 7:46 PM 12/19/2022    A/P:  S:  47 y.o. female  with a chief complaint of FLANK PAIN    O:  Patient Vitals for the past 72 hrs:   Temp Heart Rate Resp BP BP Mean SpO2 Weight   12/18/22 2104 -- 117 18 171/92 (!) 118 MM HG -- --   12/18/22 1940 98.3 F (36.8 C) 115 18 159/100 (!) 120 MM HG 99 % --   12/18/22 1939 -- -- -- -- -- -- 96.2 kg (212 lb)   12/18/22 1909 -- 126 -- -- -- 98 % --

## 2022-12-18 NOTE — ED Triage Notes (Signed)
Formatting of this note is different from the original.  Patient arrives ambulatory with complaint of left flank pain.     Patient reports symptoms began "months" ago.    Past Medical History:   Diagnosis Date    Cardiac disorder     Cystitis, interstitial     Diabetes mellitus ([place])     Diabetic neuropathy ([place])     Hypertension        Electronically signed by Carl Best, RN at 12/18/2022  7:09 PM EST

## 2022-12-18 NOTE — ED Notes (Signed)
Formatting of this note might be different from the original.  Patient c/o L flank pain that radiates across her abdomen for "6 months".  Electronically signed by Virgina Organ, RN at 12/18/2022  7:44 PM EST

## 2022-12-18 NOTE — ED Notes (Signed)
Formatting of this note is different from the original.     12/18/22 2010   Lab Draw   Lab Draw Time 2010   Lab Sent Time 2010   Lab Draw Site Left;Antecubital   Lab Draw Device Angiocatheter   Device Gauge 20   # of Lab Draw Attempts 1 Attempt   Lab Tubes ISTAT;Lavender;Blue;Green;SST     Pt to CT waiting room.  Electronically signed by Deirdre Pippins, LPN at [id number]  075-GRM PM EST

## 2022-12-18 NOTE — ED Notes (Signed)
Formatting of this note might be different from the original.  Pain assessment on discharge was addressed.  Condition stable.  Patient discharged to home.  Patient education was completed:  yes  Education taught to:  patient  Teaching method used was discussion and handout.  Understanding of teaching was good.  Patient was discharged ambulatory.  Discharged with self.  Valuables were given to: patient.   Electronically signed by Virgina Organ, RN at 12/18/2022  9:45 PM EST

## 2022-12-19 NOTE — Telephone Encounter (Signed)
Spoke w/pt, had CT completed last night at [facility] for flank pain; pt was advised the CT showed no abnormalities and was referred to GI; pt has called GI and left message to schedule, waiting for return call; pt is asking if PCP can see the CT results.

## 2022-12-19 NOTE — Telephone Encounter (Signed)
Informed pt CT results were viewed by PCP and he agrees w/referral to GI; pt will send referral information to our office; sent pt immunization report via mychart.

## 2022-12-19 NOTE — Telephone Encounter (Signed)
Pt called requesting to speak with Dr Nelle Don clinical staff. Pt went to [facility] last night (2.19.2024) and had CT done. Pt would like to discussed pain she has been feeling.  Best contact number: [phone]

## 2022-12-20 NOTE — Telephone Encounter (Signed)
Formatting of this note might be different from the original.  Received Linzess 145 mcg PA request from: [facility]    PA sent to Cover My Meds.  KEY: [id number]  Diagnosis code: K59.04  Tried: colace, Miralax, fiber     Pending - awaiting a response.  Electronically signed by Vivi Barrack, LPN at [id number]  [id number] PM EST

## 2022-12-20 NOTE — Telephone Encounter (Signed)
Formatting of this note might be different from the original.  Received Linzess 1456 mcg PA request from: [facility]    PA sent to Cover My Meds.  KEY: [id number]  Diagnosis code: K59.04  Tried: colace, Miralax, fiber     Pending - awaiting a response.  Electronically signed by Vivi Barrack, LPN at [id number]  [id number] PM EST

## 2022-12-21 NOTE — Telephone Encounter (Signed)
Formatting of this note might be different from the original.  Approved 12/20/22 - 06/20/23  Approval letter scanned in media.    Pharmacy notified via fax.  Pt notified by mychart.      Electronically signed by Vivi Barrack, LPN at [id number]  [id number] PM EST

## 2022-12-28 ENCOUNTER — Encounter: Payer: PRIVATE HEALTH INSURANCE | Attending: General Practice | Primary: General Practice

## 2022-12-28 ENCOUNTER — Ambulatory Visit
Admit: 2022-12-28 | Discharge: 2022-12-28 | Payer: PRIVATE HEALTH INSURANCE | Attending: General Practice | Primary: General Practice

## 2022-12-28 DIAGNOSIS — M545 Low back pain, unspecified: Secondary | ICD-10-CM

## 2022-12-28 MED ORDER — KETOROLAC TROMETHAMINE 60 MG/2ML IM SOLN
60 | Freq: Once | INTRAMUSCULAR | Status: AC
Start: 2022-12-28 — End: 2022-12-28
  Administered 2022-12-28: 20:00:00 60 mg via INTRAMUSCULAR

## 2022-12-28 MED ORDER — METHYLPREDNISOLONE 4 MG PO TBPK
4 | PACK | ORAL | 0 refills | Status: AC
Start: 2022-12-28 — End: 2023-01-03

## 2022-12-28 MED ORDER — TIZANIDINE HCL 4 MG PO TABS
4 MG | ORAL_TABLET | Freq: Three times a day (TID) | ORAL | 2 refills | Status: AC | PRN
Start: 2022-12-28 — End: 2023-08-08

## 2022-12-28 NOTE — Progress Notes (Signed)
[place]      MR#: [id number]    HISTORY OF PRESENT ILLNESS  GIA SEES  is a 47 y.o.  female who presents with worsening lower back pain.  Pain is bilateral.  Pain radiates into hips.  No pain into her legs.  Does states sometimes have weakness in her legs.  No bowel or bladder incontinence.  No fever or chills.  She does have longstanding back pain.  Unknown last MRI.  Has been referred to pain management in the past.  States she has had injections in the past.  Currently not taking any medications.  States was previously on opioids which did not help.  States NSAIDs and Tylenol are not helpful.    Psychiatry: Heron Sabins     Past medical history:  Type 2 diabetes  Coronary artery disease seen in [facility]  Hypothyroidism  GERD  Hyperlipidemia  Hypertension  Migraine  Obesity  Degenerative disc disease  Bipolar on Latuda     Social:  Tobacco use: Every day, 1ppd x 20+ years   Alcohol use: Yes  Illicit drug use: Marijuana  Housing: Lives in [place], lives alone   Employment: [employer], [place] maintenance:  Colon cancer screening: Due in 2032  Breast cancer screening: Due, ordered  Cervical cancer screening: No longer has cervix   Lung cancer screening: At 50  COVID: Edgar x 3  Influenza: UTD  PCV: PPSV23 in 2020  Shingles: At 31      Family History   Problem Relation Age of Onset    Diabetes Mother     Heart Disease Mother     Diabetes Father     Cancer Sister     No Known Problems Brother     Heart Disease Maternal Aunt     No Known Problems Maternal Uncle     Cancer Paternal Aunt     No Known Problems Paternal Uncle     No Known Problems Maternal Grandmother     No Known Problems Maternal Grandfather     No Known Problems Paternal Grandmother     No Known Problems Paternal Grandfather     No Known Problems Other        Allergies   Allergen Reactions    Lisinopril Cough    Hydrocodone-Acetaminophen Itching    Hydrocodone     Lisinopril-Hydrochlorothiazide Other (See  Comments)     Dry cough     Penicillins Other (See Comments)       Social History     Tobacco Use   Smoking Status Every Day    Current packs/day: 0.25    Types: Cigarettes   Smokeless Tobacco Never       Social History     Substance and Sexual Activity   Alcohol Use Yes       Immunization History   Administered Date(s) Administered    COVID-19, PFIZER PURPLE top, DILUTE for use, (age 16 y+), 51mg/0.3mL 03/31/2020, 04/21/2020, 11/12/2020    Influenza, FLUARIX, FLULAVAL, FLUZONE (age 16079mo+) AND AFLURIA, (age 4596y+), PF, 0.528m10/20/2021    Influenza, FLUCELVAX, (age 160 48o+), MDCK, PF, 0.68m32m0/25/2020, 07/21/2022    PPD Test 08/18/2020    Pneumococcal, PPSV23, PNEUMOVAX 23,67age 2y+), SC/IM, 0.68mL51m/23/2020    TDaP, ADACEL (age 10y-51y-64yOOS[place]leye 10y+), IM, 0.68mL 66m20/2019         Current Outpatient Medications:     methylPREDNISolone (MEDROL DOSEPACK) 4 MG tablet, Take  by mouth., Disp: 1 kit, Rfl: 0    tiZANidine (ZANAFLEX) 4 MG tablet, Take 1 tablet by mouth 3 times daily as needed (back pain), Disp: 30 tablet, Rfl: 2    vitamin D (ERGOCALCIFEROL) 1.25 MG (50000 UT) CAPS capsule, Take 1 capsule by mouth once a week, Disp: 12 capsule, Rfl: 1    metFORMIN (GLUCOPHAGE) 1000 MG tablet, TAKE 1 TABLET 2 TIMES DAILY (WITH MEALS) FOR 90 DAYS. INDICATIONS: TYPE 2 DIABETES MELLITUS, Disp: 180 tablet, Rfl: 3    atorvastatin (LIPITOR) 40 MG tablet, TAKE 1 TABLET BY MOUTH NIGHTLY FOR 90 DAYS. INDICATIONS: HIGH CHOLESTEROL, Disp: 90 tablet, Rfl: 3    Dulaglutide 4.5 MG/0.5ML SOPN, Inject 4.5 mg into the skin once a week Indications: Diabetes, Disp: 12 Adjustable Dose Pre-filled Pen Syringe, Rfl: 3    dapagliflozin (FARXIGA) 10 MG tablet, Take 1 tablet by mouth daily, Disp: 90 tablet, Rfl: 3    gabapentin (NEURONTIN) 300 MG capsule, Take 1 capsule by mouth 3 times daily for 90 days. Max Daily Amount: 900 mg, Disp: 90 capsule, Rfl: 2    olmesartan (BENICAR) 40 MG tablet, Take 1 tablet by mouth daily, Disp: 90 tablet, Rfl:  3    atenolol (TENORMIN) 50 MG tablet, Take 1 tablet by mouth daily, Disp: 90 tablet, Rfl: 3    levothyroxine (SYNTHROID) 200 MCG tablet, Take 1 tablet by mouth every morning (before breakfast), Disp: 90 tablet, Rfl: 1    hydrOXYzine pamoate (VISTARIL) 50 MG capsule, TAKE 1 CAPSULE BY MOUTH 3 TIMES DAILY AS NEEDED FOR ANXIETY., Disp: 90 capsule, Rfl: 2    cloNIDine (CATAPRES) 0.3 MG tablet, TAKE 1 TABLET BY MOUTH TWO TIMES A DAY., Disp: 180 tablet, Rfl: 3    Lancets MISC, Use to test blood sugar once daily, Disp: , Rfl:     aspirin 81 MG EC tablet, Take 1 tablet by mouth daily, Disp: , Rfl:     linaclotide (LINZESS) 145 MCG capsule, Take 1 capsule by mouth every morning (before breakfast), Disp: , Rfl:     nitroGLYCERIN (NITROSTAT) 0.4 MG SL tablet, Place 1 tablet under the tongue every 5 minutes as needed, Disp: , Rfl:     pantoprazole (PROTONIX) 40 MG tablet, Take 1 tablet by mouth 2 times daily, Disp: , Rfl:     traZODone (DESYREL) 50 MG tablet, Take 1 tablet by mouth nightly, Disp: , Rfl:     fluconazole (DIFLUCAN) 150 MG tablet, Take 1 tablet by mouth every 72 hours (Patient not taking: Reported on 12/28/2022), Disp: 6 tablet, Rfl: 1    buPROPion (WELLBUTRIN XL) 150 MG extended release tablet, TAKE 1 TABLET BY MOUTH IN THE MORNING AND IN THE EVENING (Patient not taking: Reported on 12/12/2022), Disp: 60 tablet, Rfl: 3    lurasidone (LATUDA) 40 MG TABS tablet, Take 1 tablet by mouth Daily with supper (Patient not taking: Reported on 12/12/2022), Disp: , Rfl:       Review of Systems   Constitutional:  Negative for appetite change, chills, fatigue, fever and unexpected weight change.   HENT:  Negative for congestion, rhinorrhea and sore throat.    Eyes:  Negative for pain.   Respiratory:  Negative for cough, chest tightness and shortness of breath.    Cardiovascular:  Negative for chest pain, palpitations and leg swelling.   Gastrointestinal:  Negative for abdominal pain, constipation, diarrhea, nausea and vomiting.    Genitourinary:  Negative for dysuria and frequency.   Musculoskeletal:  Positive for arthralgias and back  pain. Negative for myalgias.   Skin:  Negative for rash and wound.   Neurological:  Negative for dizziness and weakness.       Physical Exam  Constitutional:       General: She is not in acute distress.     Appearance: Normal appearance. She is not ill-appearing or toxic-appearing.   HENT:      Head: Normocephalic and atraumatic.      Mouth/Throat:      Pharynx: No posterior oropharyngeal erythema.   Cardiovascular:      Rate and Rhythm: Normal rate and regular rhythm.      Pulses: Normal pulses.           Dorsalis pedis pulses are 2+ on the right side and 2+ on the left side.      Heart sounds: Normal heart sounds. No murmur heard.     No friction rub. No gallop.   Pulmonary:      Effort: Pulmonary effort is normal. No respiratory distress.      Breath sounds: Normal breath sounds. No wheezing, rhonchi or rales.   Abdominal:      General: Abdomen is flat. Bowel sounds are normal.      Palpations: Abdomen is soft.      Tenderness: There is no abdominal tenderness. There is no guarding.   Musculoskeletal:      Cervical back: No tenderness.      Thoracic back: Spasms and tenderness present. No swelling, deformity, lacerations or bony tenderness. Normal range of motion.      Lumbar back: Spasms and tenderness present. No bony tenderness. Normal range of motion. Negative right straight leg raise test and negative left straight leg raise test. No scoliosis.      Right foot: Normal range of motion. No deformity, bunion or prominent metatarsal heads.      Left foot: Normal range of motion. No deformity, bunion or prominent metatarsal heads.   Feet:      Right foot:      Protective Sensation: 5 sites tested.  5 sites sensed.      Skin integrity: Skin integrity normal. No ulcer, blister, skin breakdown, callus or dry skin.      Toenail Condition: Right toenails are normal.      Left foot:      Protective Sensation: 5  sites tested.  5 sites sensed.      Skin integrity: Skin integrity normal. No ulcer, blister, skin breakdown, callus or dry skin.      Toenail Condition: Left toenails are normal.   Skin:     General: Skin is warm.      Capillary Refill: Capillary refill takes less than 2 seconds.      Findings: No erythema or rash.      Comments: Folliculitis along pubis/pubic hair without discharge   Neurological:      General: No focal deficit present.      Mental Status: She is alert and oriented to person, place, and time. Mental status is at baseline.      Motor: No weakness.      Gait: Gait normal.   Psychiatric:         Mood and Affect: Mood normal.         Behavior: Behavior normal.         Thought Content: Thought content normal.         Judgment: Judgment normal.          ASSESSMENT and PLAN  Christy [name] was seen today for back pain.    Diagnoses and all orders for this visit:    Acute bilateral low back pain without sciatica  -     ketorolac (TORADOL) injection 60 mg  -     methylPREDNISolone (MEDROL DOSEPACK) 4 MG tablet; Take by mouth.  -     tiZANidine (ZANAFLEX) 4 MG tablet; Take 1 tablet by mouth 3 times daily as needed (back pain)  -     External Referral To Pain Clinic    Degenerative disc disease, thoracic  -     ketorolac (TORADOL) injection 60 mg  -     methylPREDNISolone (MEDROL DOSEPACK) 4 MG tablet; Take by mouth.  -     External Referral To Pain Clinic    Chronic bilateral low back pain without sciatica  -     MRI LUMBAR SPINE WO CONTRAST; Future  -     External Referral To Pain Clinic      # Acute on chronic lower back pain -patient presents with about a week of worsening lower back pain, bilateral with pain rating to her hips.  No sciatic symptoms.  No red flag symptoms.  Has multiple tender trigger points along her back, no bony tenderness.  Negative straight leg raise test.  Reviewed previous x-rays and CT scans.  No previous MRIs for my review.  Due to the chronicity of her symptoms will complete MRI.   Will refer to pain management for consideration of injections.  Patient has been referred to a pain management doctor in [place] but is unable to make it, will try Dr. Augustin Coupe in [facility].  Start Medrol Dosepak.  Start tizanidine, Flexeril is not helpful in the past.  Toradol injection today.  Follow-up if no improvement.    # Diabetes with neuropathy -continues to be well-controlled, A1c 7.7, down from 8.  On ARB, on statin.  Continue current medicines refill Trulicity.  No longer taking Januvia.  Diabetic foot exam complete will refer to eye exam.  Refill gabapentin.  Follow-up in 3 months    # Hypertension, CAD, HFrEF -follows with Dr. Rogue Bussing.  Reviewed previous notes.  Mildly elevated today.  On aspirin, statin.  Continue current medications.    # Left upper quadrant abdominal pain -about 1 year of abdominal pain.  Painful and tenderness on exam today up underneath the left 11th and 12th ribs.  Possible related to nephrolithiasis versus colitis.  Due to quality of pain and chronicity of pain will complete CT abdomen and pelvis.  Check UA.    # Hypothyroidism -continue Synthroid, last TSH 0.38    # Vitamin D deficiency -vitamin D 25, on replacement    #Bipolar vs MDD -no longer feels comfortable with her psychiatrist or counselor.  Previously on [place].  Previously on Paxil.  Currently taking hydroxyzine and trazodone.    #HM -     Plan of care has been discussed, patient agrees with plan; all questions answered.   More than 50% of the time spent in this visit was counseling the patient about  illness and treatment options     Follow up in 3 months      Glade Stanford. [facility]     PLEASE NOTE:   This document has been produced using voice recognition software. Unrecognized errors in transcription may be present.

## 2022-12-28 NOTE — Telephone Encounter (Signed)
From: Christy [name]  Sent: 12/28/2022 11:27 AM EST  To: [facility] Clinical Staff  Subject: Immunization Report    I am experiencing very overwhelming pain in my back and also in the side area/upper stomach same as before it's starting to get to the point that I cannot walk straight I need to see someone as soon as possible I am suffering really bad please help me

## 2022-12-28 NOTE — Progress Notes (Signed)
Christy [name] is a 47 y.o. female (DOB: 05-14-1976) presenting to address:    Chief Complaint   Patient presents with    Back Pain       Vitals:    12/28/22 1422   BP: 134/82   Pulse: (!) 132   Resp: 18   Temp: 97.9 F (36.6 C)   SpO2: 98%       "Have you been to the ER, urgent care clinic since your last visit?  Hospitalized since your last visit?"    NO    "Have you seen or consulted any other health care providers outside of [facility] since your last visit?"    NO       Have you had a mammogram?"   NO     "Have you had a pap smear?"    NO; pt had partial hysterectomy

## 2022-12-29 ENCOUNTER — Encounter

## 2022-12-29 NOTE — Progress Notes (Signed)
New GI referral placed to be faxed to accepting practice at [facility]

## 2023-01-05 ENCOUNTER — Ambulatory Visit
Admit: 2023-01-05 | Discharge: 2023-01-05 | Payer: PRIVATE HEALTH INSURANCE | Attending: Internal Medicine | Primary: General Practice

## 2023-01-05 DIAGNOSIS — G4730 Sleep apnea, unspecified: Secondary | ICD-10-CM

## 2023-01-05 MED ORDER — CARVEDILOL 25 MG PO TABS
25 | ORAL_TABLET | Freq: Two times a day (BID) | ORAL | 3 refills | Status: DC
Start: 2023-01-05 — End: 2023-05-23

## 2023-01-05 NOTE — Patient Instructions (Addendum)
 New Medication/Medication Changes  Stop atenolol    Coreg  25 mg tab twice a day  Clonidine  0.3 mg daily but if BP increased resume taking twice a day     **please allow 24-48 hrs for medication to be escribed to pharmacy** If you need any refills on medications please contact your pharmacy so that the request can be escribed to the provider for review seven to 10 days prior to being out of medication.          Referral  Referral to Dr Donah

## 2023-01-05 NOTE — Progress Notes (Signed)
 Christy [name] presents today for   Chief Complaint   Patient presents with    Follow-up    Edema     Legs, Feet, ankles and abd       Christy [name] preferred language for health care discussion is english/other.    Is someone accompanying this pt? no    Is the patient using any DME equipment during OV? no    Depression Screening:  Depression: At risk (12/12/2022)    PHQ-2     PHQ-2 Score: 23        Learning Assessment:  No question data found.       Pt currently taking Anticoagulant therapy? no    Pt currently taking Antiplatelet therapy ? no      Coordination of Care:  1. Have you been to the ER, urgent care clinic since your last visit? Hospitalized since your last visit? no    2. Have you seen or consulted any other health care providers outside of the [facility] since your last visit? Include any pap smears or colon screening. no

## 2023-01-05 NOTE — Progress Notes (Signed)
 Christy [name]    Chief Complaint   Patient presents with    Follow-up    Edema     Legs, Feet, ankles and abd       HPI    Christy [name] is a 47 y.o. very pleasant AAF with DM2, HTN, OSA, here for overdue follow up.    As you know this patient has had uncontrolled Bps for quite some time. She has c/o SOB, gasping, poor sleep, LE edema. She doesn't sleep well. She had a prior dx of OSA but says hasn't had a CPAP in over 3 yrs. Almost all of her complaints seem to be since then.     She thought wasn't tolerating Metoprolol  so her Atenolol  was increased but it hasn't made a difference with her Bps at all.    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    Enlarged heart     Hypercholesterolemia     Hypertension     IBS (irritable bowel syndrome)     Interstitial cystitis     Sciatica     Sleep apnea     Spinal stenosis     Thyroid disease        Past Surgical History:   Procedure Laterality Date    CHOLECYSTECTOMY      2008    ORTHOPEDIC SURGERY      LEFT KNEE    ORTHOPEDIC SURGERY      LEFT ARM    PARTIAL HYSTERECTOMY (CERVIX NOT REMOVED)  2003    STILL HAVE OVARIES    PELVIC LAPAROSCOPY      THYROIDECTOMY         Current Outpatient Medications   Medication Sig Dispense Refill    carvedilol  (COREG ) 25 MG tablet Take 1 tablet by mouth 2 times daily 60 tablet 3    tiZANidine  (ZANAFLEX ) 4 MG tablet Take 1 tablet by mouth 3 times daily as needed (back pain) 30 tablet 2    vitamin D  (ERGOCALCIFEROL ) 1.25 MG (50000 UT) CAPS capsule Take 1 capsule by mouth once a week 12 capsule 1    metFORMIN  (GLUCOPHAGE ) 1000 MG tablet TAKE 1 TABLET 2 TIMES DAILY (WITH MEALS) FOR 90 DAYS. INDICATIONS: TYPE 2 DIABETES MELLITUS 180 tablet 3    atorvastatin  (LIPITOR) 40 MG tablet TAKE 1 TABLET BY MOUTH NIGHTLY FOR 90 DAYS. INDICATIONS: HIGH CHOLESTEROL 90  tablet 3    Dulaglutide  4.5 MG/0.5ML SOPN Inject 4.5 mg into the skin once a week Indications: Diabetes 12 Adjustable Dose Pre-filled Pen Syringe 3    dapagliflozin  (FARXIGA ) 10 MG tablet Take 1 tablet by mouth daily 90 tablet 3    gabapentin  (NEURONTIN ) 300 MG capsule Take 1 capsule by mouth 3 times daily for 90 days. Max Daily Amount: 900 mg 90 capsule 2    olmesartan  (BENICAR ) 40 MG tablet Take 1 tablet by mouth daily 90 tablet 3    levothyroxine  (SYNTHROID ) 200 MCG tablet Take 1 tablet by mouth every morning (before breakfast) 90 tablet 1    buPROPion  (WELLBUTRIN  XL) 150 MG extended release tablet TAKE 1 TABLET BY MOUTH IN THE MORNING AND IN THE EVENING 60 tablet 3  hydrOXYzine  pamoate (VISTARIL ) 50 MG capsule TAKE 1 CAPSULE BY MOUTH 3 TIMES DAILY AS NEEDED FOR ANXIETY. 90 capsule 2    cloNIDine  (CATAPRES ) 0.3 MG tablet TAKE 1 TABLET BY MOUTH TWO TIMES A DAY. (Patient taking differently: daily) 180 tablet 3    Lancets MISC Use to test blood sugar once daily      aspirin  81 MG EC tablet Take 1 tablet by mouth daily      linaclotide  (LINZESS ) 145 MCG capsule Take 1 capsule by mouth every morning (before breakfast)      lurasidone  (LATUDA ) 40 MG TABS tablet Take 1 tablet by mouth Daily with supper      nitroGLYCERIN  (NITROSTAT ) 0.4 MG SL tablet Place 1 tablet under the tongue every 5 minutes as needed      pantoprazole  (PROTONIX ) 40 MG tablet Take 1 tablet by mouth 2 times daily      traZODone  (DESYREL ) 50 MG tablet Take 1 tablet by mouth nightly       No current facility-administered medications for this visit.       Allergies   Allergen Reactions    Lisinopril Cough    Hydrocodone-Acetaminophen  Itching    Hydrocodone     Lisinopril-Hydrochlorothiazide Other (See Comments)     Dry cough     Penicillins Other (See Comments)       Social History     Socioeconomic History    Marital status: Divorced     Spouse name: Not on file    Number of children: Not on file    Years of education: Not on file    Highest  education level: Not on file   Occupational History    Not on file   Tobacco Use    Smoking status: Every Day     Current packs/day: 0.25     Average packs/day: 0.3 packs/day for 15.0 years (3.8 ttl pk-yrs)     Types: Cigarettes     Passive exposure: Never    Smokeless tobacco: Never   Vaping Use    Vaping Use: Never used   Substance and Sexual Activity    Alcohol use: Yes     Comment: Not even every month    Drug use: Yes     Types: Marijuana Oda)    Sexual activity: Yes     Partners: Male   Other Topics Concern    Not on file   Social History Narrative    Not on file     Social Determinants of Health     Financial Resource Strain: Medium Risk (02/17/2022)    Overall Financial Resource Strain (CARDIA)     Difficulty of Paying Living Expenses: Somewhat hard   Food Insecurity: Not on file (02/17/2022)   Recent Concern: Food Insecurity - Food Insecurity Present (02/17/2022)    Hunger Vital Sign     Worried About Running Out of Food in the Last Year: Sometimes true     Ran Out of Food in the Last Year: Sometimes true   Transportation Needs: Unknown (02/17/2022)    PRAPARE - Therapist, Art (Medical): Not on file     Lack of Transportation (Non-Medical): No   Physical Activity: Not on file   Stress: Not on file   Social Connections: Not on file   Intimate Partner Violence: Not on file   Housing Stability: Unknown (02/17/2022)    Housing Stability Vital Sign     Unable to Pay for Housing in the  Last Year: Not on file     Number of Places Lived in the Last Year: Not on file     Unstable Housing in the Last Year: No    4 kids all in 54s, had gest DM2 and preeclampsia with all of them    Review of Systems    14 pt Review of Systems is negative unless otherwise mentioned in the HPI.    Wt Readings from Last 3 Encounters:   01/05/23 95.3 kg (210 lb)   12/28/22 95.2 kg (209 lb 12.8 oz)   12/12/22 96.9 kg (213 lb 9.6 oz)     Temp Readings from Last 3 Encounters:   01/05/23 97.3 F (36.3 C) (Temporal)    12/28/22 97.9 F (36.6 C) (Temporal)   12/12/22 98.1 F (36.7 C) (Temporal)     BP Readings from Last 3 Encounters:   01/05/23 (!) 206/137   12/28/22 134/82   12/12/22 (!) 140/88     Pulse Readings from Last 3 Encounters:   01/05/23 (!) 111   12/28/22 (!) 132   12/12/22 (!) 113       No valid procedures specified.    Physical Exam:    BP (!) 206/137 (Site: Right Upper Arm, Position: Sitting, Cuff Size: Large Adult)   Pulse (!) 111   Temp 97.3 F (36.3 C) (Temporal)   Resp 16   Ht 1.549 m (5' 1)   Wt 95.3 kg (210 lb)   SpO2 99%   BMI 39.68 kg/m    Physical Exam  Vitals and nursing note reviewed.   Constitutional:       General: She is not in acute distress.     Appearance: Normal appearance.   HENT:      Head: Normocephalic.      Nose: Nose normal.      Mouth/Throat:      Mouth: Mucous membranes are moist.   Eyes:      Extraocular Movements: Extraocular movements intact.      Pupils: Pupils are equal, round, and reactive to light.   Neck:      Vascular: No carotid bruit.   Cardiovascular:      Rate and Rhythm: Normal rate and regular rhythm.      Pulses: Normal pulses.      Heart sounds: No murmur heard.     No friction rub. No gallop.   Pulmonary:      Effort: Pulmonary effort is normal.      Breath sounds: Normal breath sounds. No wheezing or rales.   Abdominal:      Palpations: Abdomen is soft.   Musculoskeletal:      Cervical back: Neck supple.      Right lower leg: No edema.      Left lower leg: No edema.   Skin:     General: Skin is warm and dry.   Neurological:      General: No focal deficit present.      Mental Status: She is alert and oriented to person, place, and time.   Psychiatric:         Mood and Affect: Mood normal.         Impression and Plan:  Christy [name] is a 47 y.o. with:    Secondary uncontrolled HTN, likely from untreated OSA  Signs and symptoms of OSA  HFmrEF, 45-50%, also likely related to above  Hyperlipidemia, Hetero FH? LDL almost 190 when not on statin  Chronic back  pain  NASH?  Thyroid disease    Stop Atenolol   Start Coreg  25 mg BID to address BP and LV dysfunction  Refer to sleep- needs CPAP  Can try to wean off Clonidine  if SBP <160s  Eventually after CPAP goal is <140/90 but unlikely to get there until treated so can be lenient <160  RTC ~2 months with me    Thank you for allowing me to participate in the care of your patient, please do not hesitate to call with questions or concerns.    Regards,    Fayetta Sorenson K Amylee Lodato, DO

## 2023-01-11 ENCOUNTER — Ambulatory Visit: Payer: PRIVATE HEALTH INSURANCE | Primary: General Practice

## 2023-01-15 NOTE — Telephone Encounter (Signed)
Received call from Wynelle Bourgeois central scheduling [phone]); pt is scheduled for MRI on 01/16/23; insurance has not approved the imaging; spoke w/pt to cancel the appt; call insurance to see if they will approve the MRI; pt voiced understanding.

## 2023-02-27 ENCOUNTER — Ambulatory Visit
Admit: 2023-02-27 | Discharge: 2023-02-27 | Payer: PRIVATE HEALTH INSURANCE | Attending: Physical Medicine & Rehabilitation | Primary: General Practice

## 2023-02-27 DIAGNOSIS — G8929 Other chronic pain: Secondary | ICD-10-CM

## 2023-02-27 MED ORDER — PREGABALIN 100 MG PO CAPS
100 MG | ORAL_CAPSULE | Freq: Two times a day (BID) | ORAL | 3 refills | Status: DC
Start: 2023-02-27 — End: 2023-04-19

## 2023-02-27 NOTE — Progress Notes (Signed)
Christy [name] presents today for   Chief Complaint   Patient presents with    Back Pain     lumbar       Is someone accompanying this pt? no    Is the patient using any DME equipment during OV? no    Depression Screening:       No data to display                Learning Assessment:  Failed to redirect to the Timeline version of the REVFS SmartLink.    Abuse Screening:       No data to display                Fall Risk  Failed to redirect to the Timeline version of the REVFS SmartLink.    OPIOID RISK TOOL  Failed to redirect to the Timeline version of the REVFS SmartLink.    Coordination of Care:  1. Have you been to the ER, urgent care clinic since your last visit? Yes chest pain  Hospitalized since your last visit? no    2. Have you seen or consulted any other health care providers outside of the [facility] since your last visit? no Include any pap smears or colon screening. no

## 2023-02-27 NOTE — Progress Notes (Signed)
[facility]  [address], [address]  [place], [place] [address]  Phone: [phone]  Fax: [phone]      Patient: Christy [name]                                                                              MRN: [id number]        Date of Birth: 1976/01/29          AGE: 47 y.o.             PCP: Chevis Pretty, DO  Date:  02/27/23    Reason for Consultation: Back Pain (lumbar)      HPI:  Christy [name] is a 47 y.o. female with relevant PMH of DM, with peripheral neuropathy, HTN who presents with low back pain radiating down bilateral legs.   Previously she lived in [place] and she was told she has spinal stenosis, she tried epidural injections and pain management.      The pain began 10 years prior. Denies any precipitating incident or trauma.       Neurologic symptoms: +numbness, tingling- from knees to feet- numbness, tingling entire leg .  No weakness, bowel or bladder changes.  No recent falls      Location: The pain is located in the low back    Radiation: The pain does radiate bilateral legs.    Pain Score: Currently: 9/10    Quality: Pain is of a burning, numbness, tingling, tight pulling quality.    Aggravating: Pain is exacerbated by walking, sitting, standing, lying down, and exercise  Alleviating: The pain is alleviated by nothing    Prior Treatments:  Physical therapy: Yes-- 2014  Injections:Yes in [place], [place]  > 5 years  Surgery:No  Previous Medications: percocet, flexeril - no relief, tizanadine , pregabalin 100mg  bid- felt it worked better for her but  she is not sure why it was switched back to gabapentin  Current Medications: gabapentin 300mg  tid  Previous work-up has included:   X-ray lumbar spine 2020- No spondylolisthesis. No compression deformity. Mild endplate spondylosis anteriorly, stable. No significant loss of discal height. Facets are intact. No spondylolysis. Pedicles are intact. Moderate fecal retention in the transverse  colon.   X-ray thoracic 2019  No spondylolisthesis. No compression deformity. Mild endplate spondylosis anteriorly, stable. No significant loss of discal height. Facets are intact. No spondylolysis. Pedicles are intact. Moderate fecal retention in the transverse colon.   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    Enlarged heart     Hypercholesterolemia     Hypertension     IBS (irritable bowel syndrome)     Interstitial cystitis     Sciatica     Sleep apnea     Spinal stenosis     Thyroid disease  Past Surgical History:   Past Surgical History:   Procedure Laterality Date    CHOLECYSTECTOMY      2008    ORTHOPEDIC SURGERY      LEFT KNEE    ORTHOPEDIC SURGERY      LEFT ARM    PARTIAL HYSTERECTOMY (CERVIX NOT REMOVED)  2003    STILL HAVE OVARIES    PELVIC LAPAROSCOPY      THYROIDECTOMY        SocHx:   Social History     Tobacco Use    Smoking status: Every Day     Current packs/day: 0.25     Average packs/day: 0.3 packs/day for 15.0 years (3.8 ttl pk-yrs)     Types: Cigarettes     Passive exposure: Never    Smokeless tobacco: Never   Substance Use Topics    Alcohol use: Yes     Comment: Not even every month      FamHx:?   Family History   Problem Relation Age of Onset    Diabetes Mother     Heart Disease Mother     Diabetes Father     Cancer Sister     No Known Problems Brother     Heart Disease Maternal Aunt     No Known Problems Maternal Uncle     Cancer Paternal Aunt     No Known Problems Paternal Uncle     No Known Problems Maternal Grandmother     No Known Problems Maternal Grandfather     No Known Problems Paternal Grandmother     No Known Problems Paternal Grandfather     No Known Problems Other        Current Medications:   Current Outpatient Medications   Medication Sig Dispense Refill     pregabalin (LYRICA) 100 MG capsule Take 1 capsule by mouth 2 times daily for 30 days. Max Daily Amount: 200 mg 60 capsule 3    carvedilol (COREG) 25 MG tablet Take 1 tablet by mouth 2 times daily 60 tablet 3    tiZANidine (ZANAFLEX) 4 MG tablet Take 1 tablet by mouth 3 times daily as needed (back pain) 30 tablet 2    vitamin D (ERGOCALCIFEROL) 1.25 MG (50000 UT) CAPS capsule Take 1 capsule by mouth once a week 12 capsule 1    metFORMIN (GLUCOPHAGE) 1000 MG tablet TAKE 1 TABLET 2 TIMES DAILY (WITH MEALS) FOR 90 DAYS. INDICATIONS: TYPE 2 DIABETES MELLITUS 180 tablet 3    atorvastatin (LIPITOR) 40 MG tablet TAKE 1 TABLET BY MOUTH NIGHTLY FOR 90 DAYS. INDICATIONS: HIGH CHOLESTEROL 90 tablet 3    Dulaglutide 4.5 MG/0.5ML SOPN Inject 4.5 mg into the skin once a week Indications: Diabetes 12 Adjustable Dose Pre-filled Pen Syringe 3    dapagliflozin (FARXIGA) 10 MG tablet Take 1 tablet by mouth daily 90 tablet 3    gabapentin (NEURONTIN) 300 MG capsule Take 1 capsule by mouth 3 times daily for 90 days. Max Daily Amount: 900 mg 90 capsule 2    olmesartan (BENICAR) 40 MG tablet Take 1 tablet by mouth daily 90 tablet 3    levothyroxine (SYNTHROID) 200 MCG tablet Take 1 tablet by mouth every morning (before breakfast) 90 tablet 1    buPROPion (WELLBUTRIN XL) 150 MG extended release tablet TAKE 1 TABLET BY MOUTH IN THE MORNING AND IN THE EVENING 60 tablet 3    hydrOXYzine pamoate (VISTARIL) 50 MG capsule TAKE 1 CAPSULE BY MOUTH 3 TIMES DAILY AS NEEDED FOR  ANXIETY. 90 capsule 2    cloNIDine (CATAPRES) 0.3 MG tablet TAKE 1 TABLET BY MOUTH TWO TIMES A DAY. (Patient taking differently: daily) 180 tablet 3    Lancets MISC Use to test blood sugar once daily      aspirin 81 MG EC tablet Take 1 tablet by mouth daily      linaclotide (LINZESS) 145 MCG capsule Take 1 capsule by mouth every morning (before breakfast)      lurasidone (LATUDA) 40 MG TABS tablet Take 1 tablet by mouth Daily with supper      nitroGLYCERIN (NITROSTAT) 0.4 MG SL  tablet Place 1 tablet under the tongue every 5 minutes as needed      pantoprazole (PROTONIX) 40 MG tablet Take 1 tablet by mouth 2 times daily      traZODone (DESYREL) 50 MG tablet Take 1 tablet by mouth nightly       No current facility-administered medications for this visit.      Allergies:    Allergies   Allergen Reactions    Lisinopril Cough    Hydrocodone-Acetaminophen Itching    Hydrocodone     Lisinopril-Hydrochlorothiazide Other (See Comments)     Dry cough     Penicillins Other (See Comments)        Physical Exam     Vital Signs: Ht 1.549 m (5\' 1" )   Wt 94.8 kg (209 lb)   BMI 39.49 kg/m    General: ???????  female Body mass index is 39.49 kg/m. without any acute distress   Psychiatric: ?  Alert and oriented x 3 with normal mood    HEENT: ????????  Atraumatic   Respiratory:   Breathing non-labored and non dyspneic   CV: ???????????????? Peripheral pulses intact, no peripheral edema   Skin: ?????????????  No rashes       Neurologic: ??      Sensation: normal and grossly intact thebilateral, lower extremity(s) except diminished below the knee bilateral    Strength: 5/5 in the bilateral, lower extremity(s)   Reflexes: reveals 2+ symmetric DTRs throughout LE  Gait: normal     Musculoskeletal: Lumbar Exam     Inspection:   Alignment: Normal  Atrophy: None       Tenderness to Palpation:   Lumbar paraspinals Positive  Lumbar spinous processes Negative  SI Joint:  Negative  Gluteal:Positive   Greater trochanter: Negative  IT Band:Negative    ROM:   Lumbar ROM: Abnormal- pain with flexion and extension  Lumbar facet loading: Negative  Hip ROM: No reproduction of pain with movement     Special Tests      Slump test: Negative  SLR: Positive  FABER: Negative  FADIR: Negative  Scour:  Negative  Stinchfield: Negative  Log Roll: Negative       Medical Decision Making:    Images: The imaging results as well as the actual images of the studies below were reviewed, visualized and interpreted by me.     Labs:  The  results below were reviewed.        Hemoglobin A1C   Date Value Ref Range Status   03/16/2021 8.8 (H) 4.2 - 5.6 % Final     Comment:     (NOTE)  HbA1C Interpretive Ranges  <5.7              Normal  5.7 - 6.4         Consider Prediabetes  >6.5  Consider Diabetes         Assessment:   1. Chronic bilateral low back pain with bilateral sciatica  -     pregabalin (LYRICA) 100 MG capsule; Take 1 capsule by mouth 2 times daily for 30 days. Max Daily Amount: 200 mg, Disp-60 capsule, R-3Normal       Plan:      -Physical therapy -  physician guided home exercises for core strengthening, LE flexibility and lumbar pelvic stabilization provided  -Medications - will changed gabapentin 300mg  tid to lyrica 100mg  bid which she reports worked better for her . Counseled regarding side effects and appropriate administration of medications.    -Diagnostics/Imaging - reviewed prior x-ray   -Injections - consider ESI    -Lifestyle - Encouraged regular exercise and weight loss   -Education - The patient's diagnosis, prognosis and treatment options were discussed today. All questions were answered.    F/U - in 6 week(s) or sooner if needed.  Consider MRI lumbar spine          Anna-Christina Jinger Neighbors MD  [facility]

## 2023-03-12 ENCOUNTER — Ambulatory Visit
Admit: 2023-03-12 | Discharge: 2023-03-12 | Payer: PRIVATE HEALTH INSURANCE | Attending: General Practice | Primary: General Practice

## 2023-03-12 DIAGNOSIS — E119 Type 2 diabetes mellitus without complications: Secondary | ICD-10-CM

## 2023-03-12 LAB — AMB POC HEMOGLOBIN A1C: Hemoglobin A1C, POC: 6.9 %

## 2023-03-12 MED ORDER — TRAZODONE HCL 50 MG PO TABS
50 MG | ORAL_TABLET | Freq: Every evening | ORAL | 1 refills | Status: AC
Start: 2023-03-12 — End: 2023-08-08

## 2023-03-12 MED ORDER — HYDROXYZINE PAMOATE 50 MG PO CAPS
50 | ORAL_CAPSULE | Freq: Three times a day (TID) | ORAL | 2 refills | Status: DC | PRN
Start: 2023-03-12 — End: 2024-10-15

## 2023-03-12 MED ORDER — VENLAFAXINE HCL ER 37.5 MG PO CP24
37.5 MG | ORAL_CAPSULE | Freq: Every day | ORAL | 3 refills | Status: DC
Start: 2023-03-12 — End: 2023-04-09

## 2023-03-12 MED ORDER — LURASIDONE HCL 20 MG PO TABS
20 MG | ORAL_TABLET | Freq: Every day | ORAL | 1 refills | Status: DC
Start: 2023-03-12 — End: 2023-04-09

## 2023-03-12 NOTE — Progress Notes (Signed)
Christy [name] is a 47 y.o. female (DOB: [birth year]) presenting to address:    Chief Complaint   Patient presents with    Diabetes       Vitals:    03/12/23 1432   BP: (!) 140/100   Pulse: (!) 106   Resp: 16   Temp: 97.9 F (36.6 C)   SpO2: 99%       "Have you been to the ER, urgent care clinic since your last visit?  Hospitalized since your last visit?"    NO    "Have you seen or consulted any other health care providers outside of [facility] since your last visit?"    YES - When: approximately 5 days ago.  Where and Why: spine specialist.       Have you had a mammogram?"   NO    Date of last Mammogram: 09/30/2020      "Have you had a pap smear?"    NO    No cervical cancer screening on file

## 2023-03-12 NOTE — Patient Instructions (Signed)
Jiles Prows, DO  Sleep Medicine  [phone]

## 2023-03-12 NOTE — Progress Notes (Signed)
[facility]      MR#: [id number]    HISTORY OF PRESENT ILLNESS  History of Present Illness  The patient presents for evaluation of multiple medical concerns.    The patient consulted with [facility] 02/27/2023 for her back pain. She was prescribed Lyrica and advised to perform certain exercises at home to qualify for an MRI. However, the MRI has been postponed until the next appointment next month. Despite these exercises, she reports no improvement in her condition. The Lyrica is ineffective in managing her pain. A few days ago, she experienced severe pain and was barely able to walk. 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    Dr. Rito Ehrlich prescribed carvedilol, which the patient has been adhering to. Last Tuesday, her blood pressure was recorded as 220/158, despite taking Olmesartan and clonidine in the morning. At night, she takes clonidine and carvedilol. However, she experienced dizziness and lightheadedness around 12:00 AM, leading her to suspect a fainting episode. Dr. Charise Carwin suspects that her high blood pressure may be related to her sleep apnea.    The patient expresses satisfaction with her A1c levels, despite not adhering to a strict diet today. She consumed a Pepsi prior to her appointment. She continues to take [place]. She has been engaging in physical activities such as walking.    The patient expresses a desire to consult with a new psychiatrist. She has been without a therapist for the past 2 months and was advised to visit at least twice a week. Her back pain is exacerbated by her depression. She has been without her bipolar and antidepressant medications for the past 2 months due to a lack of support. She was previously on Latuda and was taken off Paxil. She has manic depression and bipolar disorder. She takes trazodone as needed for sleep. She last took Cymbalta a long time ago, but it caused weight  gain. She occasionally feels lost.    Supplemental Information  She was told that she does not need to get Pap smears anymore because she had a partial hysterectomy. She still has her ovaries. She has not had her mammogram done yet.   She is still smoking. She has tried Wellbutrin, gum and patches without success.    Psychiatry: Shea Evans      Past medical history:  Type 2 diabetes   Coronary artery disease seen in [facility]  Hypothyroidism  GERD  Hyperlipidemia  Hypertension  Migraine  Obesity  Degenerative disc disease  Bipolar on Latuda      Social:  Tobacco use: Every day, 1ppd x 20+ years   Alcohol use: Yes  Illicit drug use: Marijuana  Housing: Lives in [address], lives alone   Employment: [employer], Advertising account executive      Health maintenance:  Colon cancer screening: Due in 2032  Breast cancer screening: Due, ordered  Cervical cancer screening: No longer has cervix   Lung cancer screening: At 50  COVID: Pfizer x 3  Influenza: UTD  PCV: PPSV23 in 2020  Shingles: At 50      Current Outpatient Medications:     lurasidone (LATUDA) 20 MG TABS tablet, Take 1 tablet by mouth Daily with supper, Disp: 90 tablet, Rfl: 1    hydrOXYzine pamoate (VISTARIL) 50 MG capsule, Take 1 capsule by mouth 3 times daily as needed for Anxiety, Disp: 90 capsule, Rfl: 2    traZODone (DESYREL) 50 MG tablet, Take 1 tablet  by mouth nightly, Disp: 90 tablet, Rfl: 1    venlafaxine (EFFEXOR XR) 37.5 MG extended release capsule, Take 1 capsule by mouth daily, Disp: 30 capsule, Rfl: 3    pregabalin (LYRICA) 100 MG capsule, Take 1 capsule by mouth 2 times daily for 30 days. Max Daily Amount: 200 mg, Disp: 60 capsule, Rfl: 3    carvedilol (COREG) 25 MG tablet, Take 1 tablet by mouth 2 times daily, Disp: 60 tablet, Rfl: 3    tiZANidine (ZANAFLEX) 4 MG tablet, Take 1 tablet by mouth 3 times daily as needed (back pain), Disp: 30 tablet, Rfl: 2    vitamin D (ERGOCALCIFEROL) 1.25 MG (50000 UT) CAPS capsule, Take 1 capsule by mouth once a week, Disp: 12  capsule, Rfl: 1    metFORMIN (GLUCOPHAGE) 1000 MG tablet, TAKE 1 TABLET 2 TIMES DAILY (WITH MEALS) FOR 90 DAYS. INDICATIONS: TYPE 2 DIABETES MELLITUS, Disp: 180 tablet, Rfl: 3    atorvastatin (LIPITOR) 40 MG tablet, TAKE 1 TABLET BY MOUTH NIGHTLY FOR 90 DAYS. INDICATIONS: HIGH CHOLESTEROL, Disp: 90 tablet, Rfl: 3    Dulaglutide 4.5 MG/0.5ML SOPN, Inject 4.5 mg into the skin once a week Indications: Diabetes, Disp: 12 Adjustable Dose Pre-filled Pen Syringe, Rfl: 3    dapagliflozin (FARXIGA) 10 MG tablet, Take 1 tablet by mouth daily, Disp: 90 tablet, Rfl: 3    olmesartan (BENICAR) 40 MG tablet, Take 1 tablet by mouth daily, Disp: 90 tablet, Rfl: 3    levothyroxine (SYNTHROID) 200 MCG tablet, Take 1 tablet by mouth every morning (before breakfast), Disp: 90 tablet, Rfl: 1    cloNIDine (CATAPRES) 0.3 MG tablet, TAKE 1 TABLET BY MOUTH TWO TIMES A DAY. (Patient taking differently: daily), Disp: 180 tablet, Rfl: 3    Lancets MISC, Use to test blood sugar once daily, Disp: , Rfl:     aspirin 81 MG EC tablet, Take 1 tablet by mouth daily, Disp: , Rfl:     linaclotide (LINZESS) 145 MCG capsule, Take 1 capsule by mouth every morning (before breakfast), Disp: , Rfl:     nitroGLYCERIN (NITROSTAT) 0.4 MG SL tablet, Place 1 tablet under the tongue every 5 minutes as needed, Disp: , Rfl:     pantoprazole (PROTONIX) 40 MG tablet, Take 1 tablet by mouth 2 times daily, Disp: , Rfl:     Review of Systems   Constitutional:  Negative for appetite change, chills, fatigue, fever and unexpected weight change.   HENT:  Negative for congestion, rhinorrhea and sore throat.    Eyes:  Negative for pain.   Respiratory:  Negative for cough, chest tightness and shortness of breath.    Cardiovascular:  Negative for chest pain, palpitations and leg swelling.   Gastrointestinal:  Negative for abdominal pain, constipation, diarrhea, nausea and vomiting.   Genitourinary:  Negative for dysuria and frequency.   Musculoskeletal:  Positive for arthralgias  and back pain. Negative for myalgias.   Skin:  Negative for rash and wound.   Neurological:  Negative for dizziness and weakness.   Psychiatric/Behavioral:  Positive for decreased concentration. The patient is nervous/anxious.      BP (!) 140/100 (Site: Left Upper Arm, Position: Sitting, Cuff Size: Medium Adult)   Pulse (!) 106   Temp 97.9 F (36.6 C) (Temporal)   Resp 16   Ht 1.549 m (5\' 1" )   Wt 94.3 kg (208 lb)   SpO2 99%   BMI 39.30 kg/m     Physical Exam  Constitutional:  General: She is not in acute distress.     Appearance: Normal appearance. She is not ill-appearing or toxic-appearing.   HENT:      Head: Normocephalic and atraumatic.      Mouth/Throat:      Pharynx: No posterior oropharyngeal erythema.   Cardiovascular:      Rate and Rhythm: Normal rate and regular rhythm.      Pulses: Normal pulses.      Heart sounds: Normal heart sounds. No murmur heard.     No friction rub. No gallop.   Pulmonary:      Effort: Pulmonary effort is normal. No respiratory distress.      Breath sounds: Normal breath sounds. No wheezing, rhonchi or rales.   Abdominal:      General: Abdomen is flat. Bowel sounds are normal.      Palpations: Abdomen is soft.      Tenderness: There is no abdominal tenderness. There is no guarding.   Musculoskeletal:         General: Normal range of motion.      Cervical back: No tenderness.   Skin:     General: Skin is warm.      Capillary Refill: Capillary refill takes less than 2 seconds.      Findings: No erythema or rash.   Neurological:      General: No focal deficit present.      Mental Status: She is alert and oriented to person, place, and time. Mental status is at baseline.      Motor: No weakness.      Gait: Gait normal.   Psychiatric:         Mood and Affect: Mood normal.         Behavior: Behavior normal.         Thought Content: Thought content normal.         Judgment: Judgment normal.        Results  Laboratory Studies  A1c is 6.9.     ASSESSMENT and PLAN    Christy [name]  was seen today for diabetes.    Diagnoses and all orders for this visit:    Type 2 diabetes mellitus without complication, without long-term current use of insulin (HCC)  -     AMB POC HEMOGLOBIN A1C    Primary hypertension    Coronary artery disease involving native coronary artery of native heart without angina pectoris    Bipolar 2 disorder, major depressive episode (HCC)  -     lurasidone (LATUDA) 20 MG TABS tablet; Take 1 tablet by mouth Daily with supper  -     hydrOXYzine pamoate (VISTARIL) 50 MG capsule; Take 1 capsule by mouth 3 times daily as needed for Anxiety  -     traZODone (DESYREL) 50 MG tablet; Take 1 tablet by mouth nightly  -     venlafaxine (EFFEXOR XR) 37.5 MG extended release capsule; Take 1 capsule by mouth daily  -     External Referral To Psychology       Assessment & Plan  1. Type 2 diabetes.  The patient's type 2 diabetes is well-managed, currently at 6.9. The current treatment plan of Trulicity, metformin, and Marcelline Deist will be maintained.    2. Hypertension.  The patient's blood pressure remains poorly controlled. Upon reviewing the cardiology notes, she has recently commenced carvedilol therapy. The patient has been provided with contact information for sleep medicine, which should significantly improve her sleep apnea control. Once her sleep  apnea is better controlled, it is anticipated that her blood pressure will be better managed.    3. Bipolar 2 disorder, major depressive disorder.  The patient has been without her medications for multiple months. She reports significant depressive symptoms, including difficulty getting out of bed and work, and significant back pain. We will reinstate her Latuda, trazodone, and Vistaril, and trial Effexor for both her depressive symptoms and her pain. A referral to a psychologist for counseling has been made.    4. Chronic bilateral back pain.  The patient has discontinued gabapentin and is now taking Lyrica with minimal effect. She is performing  home exercises and physical therapy. She has an upcoming appointment with Neomia Dear, which may necessitate an MRI in the future.    Follow-up  The patient is scheduled for a follow-up visit in 1 month for her mood.    Plan of care has been discussed, patient agrees with plan; all questions answered.   More than 50% of the time spent in this visit was counseling the patient about  illness and treatment options       Mena Pauls. Christophe Louis DO  Family Medicine  [facility]       PLEASE NOTE:   This document has been produced using voice recognition software. Unrecognized errors in transcription may be present.

## 2023-04-09 ENCOUNTER — Ambulatory Visit
Admit: 2023-04-09 | Discharge: 2023-04-09 | Payer: PRIVATE HEALTH INSURANCE | Attending: General Practice | Primary: General Practice

## 2023-04-09 DIAGNOSIS — F3181 Bipolar II disorder: Secondary | ICD-10-CM

## 2023-04-09 MED ORDER — LURASIDONE HCL 40 MG PO TABS
40 | ORAL_TABLET | Freq: Every day | ORAL | 3 refills | Status: DC
Start: 2023-04-09 — End: 2023-05-07

## 2023-04-09 MED ORDER — OLMESARTAN MEDOXOMIL 40 MG PO TABS
40 MG | ORAL_TABLET | Freq: Every day | ORAL | 3 refills | Status: DC
Start: 2023-04-09 — End: 2024-01-22

## 2023-04-09 MED ORDER — VENLAFAXINE HCL ER 75 MG PO CP24
75 MG | ORAL_CAPSULE | Freq: Every day | ORAL | 3 refills | Status: AC
Start: 2023-04-09 — End: 2023-05-07

## 2023-04-09 NOTE — Progress Notes (Signed)
Christy [name] is a 47 y.o. female (DOB: [birth year]) presenting to address:    Chief Complaint   Patient presents with    Medication Check       Vitals:    04/09/23 1332   BP: (!) 146/100   Pulse: (!) 110   Resp: 16   Temp: 98.2 F (36.8 C)   SpO2: 96%       "Have you been to the ER, urgent care clinic since your last visit?  Hospitalized since your last visit?"    NO    "Have you seen or consulted any other health care providers outside of [facility] since your last visit?"    NO       Have you had a mammogram?"   NO    Date of last Mammogram: 09/30/2020      "Have you had a pap smear?"    NO    No cervical cancer screening on file

## 2023-04-09 NOTE — Progress Notes (Signed)
[facility]      MR#: [id number]    HISTORY OF PRESENT ILLNESS  History of Present Illness  The patient is a 47 year old female who presents for follow-up.    The patient reports no significant changes in her medication regimen, however, she continues to grapple with low moods and severe depression. She has been tolerating Latuda 60 mg well, but trazodone has not improved her sleep quality. She also takes hydroxyzine thrice daily, which does not induce fatigue. Her sleep is disrupted.    The patient engages in walking as a form of exercise, but reports no relief from her sciatic nerve pain, which varies in intensity from day to day. The pain has escalated to the point where she can barely rise from a seated position. She is awaiting a consultation with her spine specialist to determine if an MRI is covered by her insurance. Despite adhering to a home exercise regimen, she continues to experience severe pain. Her daughter has suggested that she apply for disability, which has negatively impacted her ability to perform household chores, cook, and stand in the shower. Despite her efforts, she does not believe she requires home assistance. She attempted to apply for disability several years ago, but her insurance did not approve it. 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 Her orthopedic doctor has suggested an MRI as the only viable option for pain management without opioids or spinal injections. Opioid use has been unsuccessful in managing her pain. Her current medication regimen includes Lyrica, which has not provided relief. She has also tried alternating between heating pads and ice, but these have not provided relief.    The patient has been without her Olmesartan medication for the past 2 weeks.     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, cleans      Health maintenance:  Colon cancer screening: Due in 2032  Breast cancer screening: Due, ordered  Cervical cancer screening: No longer has cervix   Lung cancer screening: At 50  COVID: Pfizer x 3  Influenza: UTD  PCV: PPSV23 in 2020  Shingles: At 50         Current Outpatient Medications:     olmesartan (BENICAR) 40 MG tablet, Take 1 tablet by mouth daily, Disp: 90 tablet, Rfl: 3    venlafaxine (EFFEXOR XR) 75 MG extended release capsule, Take 1 capsule by mouth daily, Disp: 30 capsule, Rfl: 3    lurasidone (LATUDA) 40 MG TABS tablet, Take 1 tablet by mouth Daily with supper, Disp: 30 tablet, Rfl: 3    hydrOXYzine pamoate (VISTARIL) 50 MG capsule, Take 1 capsule by mouth 3 times daily as needed for Anxiety, Disp: 90 capsule, Rfl: 2    traZODone (DESYREL) 50 MG tablet, Take 1 tablet by mouth nightly, Disp: 90 tablet, Rfl: 1    pregabalin (LYRICA) 100 MG capsule, Take 1 capsule by mouth 2 times daily for 30 days. Max Daily Amount: 200 mg, Disp: 60 capsule, Rfl: 3    carvedilol (COREG) 25 MG tablet, Take 1 tablet by mouth 2 times daily,  Disp: 60 tablet, Rfl: 3    tiZANidine (ZANAFLEX) 4 MG tablet, Take 1 tablet by mouth 3 times daily as needed (back pain), Disp: 30 tablet, Rfl: 2    vitamin D (ERGOCALCIFEROL) 1.25 MG (50000 UT) CAPS capsule, Take 1 capsule by mouth once a week, Disp: 12 capsule, Rfl: 1    metFORMIN (GLUCOPHAGE) 1000 MG tablet, TAKE 1 TABLET 2 TIMES DAILY (WITH MEALS) FOR 90 DAYS. INDICATIONS: TYPE 2 DIABETES MELLITUS, Disp: 180 tablet, Rfl: 3    atorvastatin (LIPITOR) 40 MG tablet, TAKE 1 TABLET BY MOUTH NIGHTLY FOR 90 DAYS. INDICATIONS: HIGH CHOLESTEROL, Disp: 90 tablet, Rfl: 3    Dulaglutide 4.5 MG/0.5ML SOPN, Inject 4.5 mg into the skin once a week Indications: Diabetes,  Disp: 12 Adjustable Dose Pre-filled Pen Syringe, Rfl: 3    dapagliflozin (FARXIGA) 10 MG tablet, Take 1 tablet by mouth daily, Disp: 90 tablet, Rfl: 3    levothyroxine (SYNTHROID) 200 MCG tablet, Take 1 tablet by mouth every morning (before breakfast), Disp: 90 tablet, Rfl: 1    cloNIDine (CATAPRES) 0.3 MG tablet, TAKE 1 TABLET BY MOUTH TWO TIMES A DAY. (Patient taking differently: daily), Disp: 180 tablet, Rfl: 3    Lancets MISC, Use to test blood sugar once daily, Disp: , Rfl:     aspirin 81 MG EC tablet, Take 1 tablet by mouth daily, Disp: , Rfl:     linaclotide (LINZESS) 145 MCG capsule, Take 1 capsule by mouth every morning (before breakfast), Disp: , Rfl:     nitroGLYCERIN (NITROSTAT) 0.4 MG SL tablet, Place 1 tablet under the tongue every 5 minutes as needed, Disp: , Rfl:     pantoprazole (PROTONIX) 40 MG tablet, Take 1 tablet by mouth 2 times daily, Disp: , Rfl:     Review of Systems   Constitutional:  Negative for appetite change, chills, fatigue, fever and unexpected weight change.   HENT:  Negative for congestion, rhinorrhea and sore throat.    Eyes:  Negative for pain.   Respiratory:  Negative for cough, chest tightness and shortness of breath.    Cardiovascular:  Negative for chest pain, palpitations and leg swelling.   Gastrointestinal:  Negative for abdominal pain, constipation, diarrhea, nausea and vomiting.   Genitourinary:  Negative for dysuria and frequency.   Musculoskeletal:  Negative for arthralgias, back pain and myalgias.   Skin:  Negative for rash and wound.   Neurological:  Negative for dizziness and weakness.     BP (!) 146/100 (Site: Left Upper Arm, Position: Sitting, Cuff Size: Medium Adult)   Pulse (!) 110   Temp 98.2 F (36.8 C) (Skin)   Resp 16   Ht 1.549 m (5\' 1" )   Wt 93.5 kg (206 lb 3.2 oz)   SpO2 96%   BMI 38.96 kg/m     Physical Exam  Constitutional:       General: She is not in acute distress.     Appearance: Normal appearance. She is not ill-appearing or  toxic-appearing.   HENT:      Head: Normocephalic and atraumatic.      Mouth/Throat:      Pharynx: No posterior oropharyngeal erythema.   Cardiovascular:      Rate and Rhythm: Normal rate and regular rhythm.      Pulses: Normal pulses.      Heart sounds: Normal heart sounds. No murmur heard.     No friction rub. No gallop.   Pulmonary:      Effort: Pulmonary effort is  normal. No respiratory distress.      Breath sounds: Normal breath sounds. No wheezing, rhonchi or rales.   Abdominal:      General: Abdomen is flat. Bowel sounds are normal.      Palpations: Abdomen is soft.      Tenderness: There is no abdominal tenderness. There is no guarding.   Musculoskeletal:         General: Normal range of motion.      Cervical back: No tenderness.   Skin:     General: Skin is warm.      Capillary Refill: Capillary refill takes less than 2 seconds.      Findings: No erythema or rash.   Neurological:      General: No focal deficit present.      Mental Status: She is alert and oriented to person, place, and time. Mental status is at baseline.      Motor: No weakness.      Gait: Gait normal.   Psychiatric:         Mood and Affect: Mood normal.         Behavior: Behavior normal.         Thought Content: Thought content normal.         Judgment: Judgment normal.        Results       ASSESSMENT and PLAN    Christy [name] was seen today for medication check.    Diagnoses and all orders for this visit:    Bipolar 2 disorder, major depressive episode (HCC)  -     venlafaxine (EFFEXOR XR) 75 MG extended release capsule; Take 1 capsule by mouth daily  -     lurasidone (LATUDA) 40 MG TABS tablet; Take 1 tablet by mouth Daily with supper    Primary hypertension  -     olmesartan (BENICAR) 40 MG tablet; Take 1 tablet by mouth daily       Assessment & Plan  1. Bipolar disease.  The patient's condition remains unchanged with the introduction of medications. We are currently on the commencement of the prescribed dosage and escalating the dosage. We  will augment her Latuda dosage to 40 mg nightly and increase her Effexor dosage to 75 mg nightly. She can persist with trazodone and hydroxyzine as required. It is anticipated that the Effexor will alleviate some of her pain.    2. Hypertension.  The patient has been without her Olmesartan medication for several weeks. Consequently, I have renewed the prescription.    Follow-up  The patient is scheduled for a follow-up visit in 4 weeks for a mood and blood pressure check.    Plan of care has been discussed, patient agrees with plan; all questions answered.   More than 50% of the time spent in this visit was counseling the patient about  illness and treatment options       Mena Pauls. Christophe Louis DO  Family Medicine  [facility]       PLEASE NOTE:   This document has been produced using voice recognition software. Unrecognized errors in transcription may be present.

## 2023-04-11 ENCOUNTER — Ambulatory Visit: Payer: PRIVATE HEALTH INSURANCE | Attending: Internal Medicine | Primary: General Practice

## 2023-04-19 ENCOUNTER — Ambulatory Visit
Admit: 2023-04-19 | Discharge: 2023-04-19 | Payer: PRIVATE HEALTH INSURANCE | Attending: Physical Medicine & Rehabilitation | Primary: General Practice

## 2023-04-19 DIAGNOSIS — G8929 Other chronic pain: Secondary | ICD-10-CM

## 2023-04-19 MED ORDER — PREGABALIN 100 MG PO CAPS
100 MG | ORAL_CAPSULE | Freq: Two times a day (BID) | ORAL | 0 refills | Status: AC
Start: 2023-04-19 — End: 2023-05-19

## 2023-04-19 NOTE — Patient Instructions (Signed)
[facility]    Please expect an automated call within 24-48 business hours to schedule your outpatient study with [facility]    If you have not received an automated call, please call [phone] to speak directly with a scheduler    [facility]    [facility]    [place]     [facility]    [place]

## 2023-04-19 NOTE — Progress Notes (Signed)
[facility]  [address]  [place], [place] [address]  Phone: [phone]  Fax: [phone]      Patient: Christy [name]                                                                              MRN: [id number]        Date of Birth: 1976/05/05          AGE: 47 y.o.             PCP: Chevis Pretty, DO  Date:  04/19/23    Reason for Consultation: Back Pain (Lumbar spine)      HPI:  Christy [name] is a 47 y.o. female with relevant PMH of DM, with peripheral neuropathy, HTN who presents with low back pain radiating down bilateral legs.   Previously she lived in [place] and she was told she has spinal stenosis, she tried epidural injections and pain management.  Since her last visiti she has been doing a physician guided home exercise program for the past 8 weeks.      The pain began 10 years prior. Denies any precipitating incident or trauma.       Neurologic symptoms: +numbness, tingling- from knees to feet- numbness, tingling entire leg .  No weakness.  bowel or bladder changes- recently reports episodes of incomplete voiding.  No recent falls      Location: The pain is located in the low back    Radiation: The pain does radiate bilateral legs.    Pain Score: Currently: 9/10    Quality: Pain is of a burning, numbness, tingling, tight pulling quality.    Aggravating: Pain is exacerbated by walking, sitting, standing, lying down, and exercise  Alleviating: The pain is alleviated by nothing    Prior Treatments:  Physical therapy: Yes-- 2014 and started physician guided home exercise program 02/27/2023-  3-4 x week until 04/19/2023  Injections:Yes in [place], [place]  > 5 years  Surgery:No  Previous Medications: percocet, flexeril - no relief, tizanadine , pregabalin 100mg  bid- felt it worked better for her but  she is not sure why it was switched back to gabapentin,  gabapentin 300mg  tid  Current Medications:lyrica 100mg  bid   Previous work-up has  included:   X-ray lumbar spine 2020- No spondylolisthesis. No compression deformity. Mild endplate spondylosis anteriorly, stable. No significant loss of discal height. Facets are intact. No spondylolysis. Pedicles are intact. Moderate fecal retention in the transverse colon.   X-ray thoracic 2019  No spondylolisthesis. No compression deformity. Mild endplate spondylosis anteriorly, stable. No significant loss of discal height. Facets are intact. No spondylolysis. Pedicles are intact. Moderate fecal retention in the transverse colon.   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    Enlarged heart  Hypercholesterolemia     Hypertension     IBS (irritable bowel syndrome)     Interstitial cystitis     Sciatica     Sleep apnea     Spinal stenosis     Thyroid disease       Past Surgical History:   Past Surgical History:   Procedure Laterality Date    CHOLECYSTECTOMY      2008    ORTHOPEDIC SURGERY      LEFT KNEE    ORTHOPEDIC SURGERY      LEFT ARM    PARTIAL HYSTERECTOMY (CERVIX NOT REMOVED)  2003    STILL HAVE OVARIES    PELVIC LAPAROSCOPY      THYROIDECTOMY        SocHx:   Social History     Tobacco Use    Smoking status: Every Day     Current packs/day: 0.25     Average packs/day: 0.3 packs/day for 15.0 years (3.8 ttl pk-yrs)     Types: Cigarettes     Passive exposure: Never    Smokeless tobacco: Never   Substance Use Topics    Alcohol use: Yes     Comment: Not even every month      FamHx:?   Family History   Problem Relation Age of Onset    Diabetes Mother     Heart Disease Mother     Diabetes Father     Cancer Sister     No Known Problems Brother     Heart Disease Maternal Aunt     No Known Problems Maternal Uncle     Cancer Paternal Aunt     No Known Problems Paternal Uncle     No Known Problems Maternal  Grandmother     No Known Problems Maternal Grandfather     No Known Problems Paternal Grandmother     No Known Problems Paternal Grandfather     No Known Problems Other        Current Medications:   Current Outpatient Medications   Medication Sig Dispense Refill    olmesartan (BENICAR) 40 MG tablet Take 1 tablet by mouth daily 90 tablet 3    venlafaxine (EFFEXOR XR) 75 MG extended release capsule Take 1 capsule by mouth daily 30 capsule 3    lurasidone (LATUDA) 40 MG TABS tablet Take 1 tablet by mouth Daily with supper 30 tablet 3    hydrOXYzine pamoate (VISTARIL) 50 MG capsule Take 1 capsule by mouth 3 times daily as needed for Anxiety 90 capsule 2    traZODone (DESYREL) 50 MG tablet Take 1 tablet by mouth nightly 90 tablet 1    pregabalin (LYRICA) 100 MG capsule Take 1 capsule by mouth 2 times daily for 30 days. Max Daily Amount: 200 mg 60 capsule 3    carvedilol (COREG) 25 MG tablet Take 1 tablet by mouth 2 times daily 60 tablet 3    tiZANidine (ZANAFLEX) 4 MG tablet Take 1 tablet by mouth 3 times daily as needed (back pain) 30 tablet 2    vitamin D (ERGOCALCIFEROL) 1.25 MG (50000 UT) CAPS capsule Take 1 capsule by mouth once a week 12 capsule 1    metFORMIN (GLUCOPHAGE) 1000 MG tablet TAKE 1 TABLET 2 TIMES DAILY (WITH MEALS) FOR 90 DAYS. INDICATIONS: TYPE 2 DIABETES MELLITUS 180 tablet 3    atorvastatin (LIPITOR) 40 MG tablet TAKE 1 TABLET BY MOUTH NIGHTLY FOR 90 DAYS. INDICATIONS: HIGH CHOLESTEROL 90 tablet 3    Dulaglutide 4.5  MG/0.5ML SOPN Inject 4.5 mg into the skin once a week Indications: Diabetes 12 Adjustable Dose Pre-filled Pen Syringe 3    dapagliflozin (FARXIGA) 10 MG tablet Take 1 tablet by mouth daily 90 tablet 3    levothyroxine (SYNTHROID) 200 MCG tablet Take 1 tablet by mouth every morning (before breakfast) 90 tablet 1    cloNIDine (CATAPRES) 0.3 MG tablet TAKE 1 TABLET BY MOUTH TWO TIMES A DAY. (Patient taking differently: daily) 180 tablet 3    Lancets MISC Use to test blood sugar once daily       aspirin 81 MG EC tablet Take 1 tablet by mouth daily      linaclotide (LINZESS) 145 MCG capsule Take 1 capsule by mouth every morning (before breakfast)      nitroGLYCERIN (NITROSTAT) 0.4 MG SL tablet Place 1 tablet under the tongue every 5 minutes as needed      pantoprazole (PROTONIX) 40 MG tablet Take 1 tablet by mouth 2 times daily       No current facility-administered medications for this visit.      Allergies:    Allergies   Allergen Reactions    Lisinopril Cough    Hydrocodone-Acetaminophen Itching    Hydrocodone     Lisinopril-Hydrochlorothiazide Other (See Comments)     Dry cough     Penicillins Other (See Comments)        Physical Exam     Vital Signs: Ht 1.524 m (5')   BMI 40.27 kg/m    General: ???????  female Body mass index is 40.27 kg/m. without any acute distress   Psychiatric: ?  Alert and oriented x 3 with normal mood    HEENT: ????????  Atraumatic   Respiratory:   Breathing non-labored and non dyspneic   CV: ???????????????? Peripheral pulses intact, no peripheral edema   Skin: ?????????????  No rashes       Neurologic: ??      Sensation: normal and grossly intact thebilateral, lower extremity(s) except diminished below the knee bilateral    Strength: 5/5 in the bilateral, lower extremity(s)   Reflexes: reveals 2+ symmetric DTRs throughout LE  Gait: normal     Musculoskeletal: Lumbar Exam     Inspection:   Alignment: Normal  Atrophy: None       Tenderness to Palpation:   Lumbar paraspinals Positive  Lumbar spinous processes Negative  SI Joint:  Negative  Gluteal:Positive   Greater trochanter: Negative  IT Band:Negative    ROM:   Lumbar ROM: Abnormal- pain with flexion and extension  Lumbar facet loading: Negative  Hip ROM: No reproduction of pain with movement     Special Tests      Slump test: Negative  SLR: Positive  FABER: Negative  FADIR: Negative  Scour:  Negative  Stinchfield: Negative  Log Roll: Negative       Medical Decision Making:    Images: The imaging results as well as the  actual images of the studies below were reviewed, visualized and interpreted by me.     Labs:  The results below were reviewed.        Hemoglobin A1C   Date Value Ref Range Status   03/16/2021 8.8 (H) 4.2 - 5.6 % Final     Comment:     (NOTE)  HbA1C Interpretive Ranges  <5.7              Normal  5.7 - 6.4         Consider Prediabetes  >  6.5              Consider Diabetes         Assessment:   1. Chronic bilateral low back pain with bilateral sciatica  -     MRI LUMBAR SPINE WO CONTRAST; Future         Plan:      -Physical therapy -  physician guided home exercises for core strengthening, LE flexibility and lumbar pelvic stabilization provided  -Medications - lyrica 100mg  bid  -Diagnostics/Imaging -MRI lumbar spine evaluate spinal stenosis, chronic pain radiating down bilateral legs-  despite physician guided home exercise program x 2 months   -Injections - consider ESI    -Lifestyle - Encouraged regular exercise and weight loss   -Education - The patient's diagnosis, prognosis and treatment options were discussed today. All questions were answered.    F/U -after MRI         Anna-Christina Jinger Neighbors MD  [facility]

## 2023-04-19 NOTE — Progress Notes (Signed)
Christy [name] presents today for   Chief Complaint   Patient presents with    Back Pain     Lumbar spine       Is someone accompanying this pt? no    Is the patient using any DME equipment during OV? no    Depression Screening:       No data to display                Learning Assessment:  Failed to redirect to the Timeline version of the REVFS SmartLink.    Abuse Screening:       No data to display                Fall Risk  Failed to redirect to the Timeline version of the REVFS SmartLink.    OPIOID RISK TOOL  Failed to redirect to the Timeline version of the REVFS SmartLink.    Coordination of Care:  1. Have you been to the ER, urgent care clinic since your last visit? no  Hospitalized since your last visit? no    2. Have you seen or consulted any other health care providers outside of the [facility] since your last visit? no Include any pap smears or colon screening. no

## 2023-04-20 NOTE — Addendum Note (Signed)
Addended by: Jacky Kindle on: 04/20/2023 05:14 PM    Modules accepted: Orders

## 2023-05-04 ENCOUNTER — Ambulatory Visit: Payer: PRIVATE HEALTH INSURANCE | Primary: General Practice

## 2023-05-04 DIAGNOSIS — M5442 Lumbago with sciatica, left side: Secondary | ICD-10-CM

## 2023-05-07 ENCOUNTER — Ambulatory Visit
Admit: 2023-05-07 | Discharge: 2023-05-07 | Payer: PRIVATE HEALTH INSURANCE | Attending: General Practice | Primary: General Practice

## 2023-05-07 VITALS — BP 128/88 | HR 108 | Temp 98.00000°F | Resp 14 | Ht 60.0 in | Wt 212.0 lb

## 2023-05-07 DIAGNOSIS — F3181 Bipolar II disorder: Principal | ICD-10-CM

## 2023-05-07 MED ORDER — LURASIDONE HCL 60 MG PO TABS
60 | ORAL_TABLET | Freq: Every day | ORAL | 1 refills | Status: AC
Start: 2023-05-07 — End: ?

## 2023-05-07 MED ORDER — VENLAFAXINE HCL ER 75 MG PO CP24
75 MG | ORAL_CAPSULE | Freq: Every day | ORAL | 3 refills | Status: DC
Start: 2023-05-07 — End: 2024-04-23

## 2023-05-07 MED ORDER — DOXYCYCLINE HYCLATE 100 MG PO TABS
100 | ORAL_TABLET | Freq: Two times a day (BID) | ORAL | 0 refills | Status: AC
Start: 2023-05-07 — End: 2023-05-14

## 2023-05-07 NOTE — Progress Notes (Signed)
[facility]      MR#: [id number]    HISTORY OF PRESENT ILLNESS  History of Present Illness  The patient is a 47 year old female who presents for medication follow-up.    The patient expresses dissatisfaction with her current medication regimen, which she reports as ineffective. She requests her blood pressure to be rechecked, as she perceives it to be low. Her mood has shown improvement with the use of Latuda and increased Effexor dosage. However, she reports difficulty sleeping, often staying awake until 5 a.m. due to pain. Despite taking trazodone, she occasionally remains awake throughout the night. Occasionally, she may not sleep for up to 2 days. She expresses a desire to maintain her current dosage of Effexor and Latuda. Her current hydroxyzine dosage is 50 mg, and she occasionally takes 2 tablets at a time due to severe anxiety.    The patient recently experienced a broken left upper molar, resulting in pain and swelling. She suspects an infection, as evidenced by facial swelling last night. She denies experiencing headaches, fever, or chills.    The patient occasionally experiences a small lump on the soles of her feet, accompanied by brittle and cracking toenails, which she attributes to her diabetes. She requests a referral to a podiatrist.    Supplemental Information  She was told she had spinal stenosis and scoliosis years ago.   She is allergic to PENICILLIN.    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, cleans      Health maintenance:  Colon cancer screening: Due in 2032  Breast cancer screening: Due, ordered  Cervical cancer screening: No longer  has cervix   Lung cancer screening: At 50  COVID: Pfizer x 3  Influenza: UTD  PCV: PPSV23 in 2020  Shingles: At 50      Current Outpatient Medications:     lurasidone (LATUDA) 60 MG TABS tablet, Take 1 tablet by mouth Daily with supper, Disp: 90 tablet, Rfl: 1    doxycycline hyclate (VIBRA-TABS) 100 MG tablet, Take 1 tablet by mouth 2 times daily for 7 days, Disp: 14 tablet, Rfl: 0    venlafaxine (EFFEXOR XR) 75 MG extended release capsule, Take 1 capsule by mouth daily, Disp: 90 capsule, Rfl: 3    pregabalin (LYRICA) 100 MG capsule, Take 1 capsule by mouth 2 times daily for 30 days. Max Daily Amount: 200 mg, Disp: 60 capsule, Rfl: 0    olmesartan (BENICAR) 40 MG tablet, Take 1 tablet by mouth daily, Disp: 90 tablet, Rfl: 3    hydrOXYzine pamoate (VISTARIL) 50 MG capsule, Take 1 capsule by mouth 3 times daily as needed for Anxiety, Disp: 90 capsule, Rfl: 2    traZODone (DESYREL) 50 MG tablet, Take 1 tablet by mouth nightly (Patient taking differently: Take 3 tablets by mouth nightly), Disp: 90 tablet, Rfl: 1    carvedilol (COREG) 25 MG tablet, Take 1 tablet by mouth 2 times daily, Disp: 60 tablet, Rfl: 3    tiZANidine (ZANAFLEX) 4 MG tablet, Take 1 tablet by mouth 3 times daily as needed (back pain), Disp: 30 tablet, Rfl: 2  vitamin D (ERGOCALCIFEROL) 1.25 MG (50000 UT) CAPS capsule, Take 1 capsule by mouth once a week, Disp: 12 capsule, Rfl: 1    metFORMIN (GLUCOPHAGE) 1000 MG tablet, TAKE 1 TABLET 2 TIMES DAILY (WITH MEALS) FOR 90 DAYS. INDICATIONS: TYPE 2 DIABETES MELLITUS, Disp: 180 tablet, Rfl: 3    atorvastatin (LIPITOR) 40 MG tablet, TAKE 1 TABLET BY MOUTH NIGHTLY FOR 90 DAYS. INDICATIONS: HIGH CHOLESTEROL, Disp: 90 tablet, Rfl: 3    Dulaglutide 4.5 MG/0.5ML SOPN, Inject 4.5 mg into the skin once a week Indications: Diabetes, Disp: 12 Adjustable Dose Pre-filled Pen Syringe, Rfl: 3    dapagliflozin (FARXIGA) 10 MG tablet, Take 1 tablet by mouth daily, Disp: 90 tablet, Rfl: 3    levothyroxine (SYNTHROID) 200  MCG tablet, Take 1 tablet by mouth every morning (before breakfast), Disp: 90 tablet, Rfl: 1    cloNIDine (CATAPRES) 0.3 MG tablet, TAKE 1 TABLET BY MOUTH TWO TIMES A DAY. (Patient taking differently: daily), Disp: 180 tablet, Rfl: 3    Lancets MISC, Use to test blood sugar once daily, Disp: , Rfl:     aspirin 81 MG EC tablet, Take 1 tablet by mouth daily, Disp: , Rfl:     linaclotide (LINZESS) 290 MCG CAPS capsule, Take 1 capsule by mouth every morning (before breakfast), Disp: , Rfl:     nitroGLYCERIN (NITROSTAT) 0.4 MG SL tablet, Place 1 tablet under the tongue every 5 minutes as needed, Disp: , Rfl:     pantoprazole (PROTONIX) 40 MG tablet, Take 1 tablet by mouth 2 times daily, Disp: , Rfl:     Review of Systems   Constitutional:  Negative for appetite change, chills, fatigue, fever and unexpected weight change.   HENT:  Negative for congestion, rhinorrhea and sore throat.    Eyes:  Negative for pain.   Respiratory:  Negative for cough, chest tightness and shortness of breath.    Cardiovascular:  Negative for chest pain, palpitations and leg swelling.   Gastrointestinal:  Negative for abdominal pain, constipation, diarrhea, nausea and vomiting.   Genitourinary:  Negative for dysuria and frequency.   Musculoskeletal:  Positive for arthralgias and back pain. Negative for myalgias.   Skin:  Negative for rash and wound.   Neurological:  Negative for dizziness and weakness.   Psychiatric/Behavioral:  Positive for sleep disturbance.      BP 128/88 (Site: Left Upper Arm, Position: Sitting, Cuff Size: Medium Adult)   Pulse (!) 108   Temp 98 F (36.7 C) (Temporal)   Resp 14   Ht 1.524 m (5')   Wt 96.2 kg (212 lb)   SpO2 97%   BMI 41.40 kg/m     Physical Exam  Constitutional:       General: She is not in acute distress.     Appearance: Normal appearance. She is not ill-appearing or toxic-appearing.   HENT:      Head: Normocephalic and atraumatic.      Mouth/Throat:      Pharynx: No posterior oropharyngeal  erythema.   Cardiovascular:      Rate and Rhythm: Normal rate and regular rhythm.      Pulses: Normal pulses.      Heart sounds: Normal heart sounds. No murmur heard.     No friction rub. No gallop.   Pulmonary:      Effort: Pulmonary effort is normal. No respiratory distress.      Breath sounds: Normal breath sounds. No wheezing, rhonchi or rales.   Abdominal:  General: Abdomen is flat. Bowel sounds are normal.      Palpations: Abdomen is soft.      Tenderness: There is no abdominal tenderness. There is no guarding.   Musculoskeletal:         General: Normal range of motion.      Cervical back: No tenderness.   Skin:     General: Skin is warm.      Capillary Refill: Capillary refill takes less than 2 seconds.      Findings: No erythema or rash.   Neurological:      General: No focal deficit present.      Mental Status: She is alert and oriented to person, place, and time. Mental status is at baseline.      Motor: No weakness.      Gait: Gait normal.   Psychiatric:         Mood and Affect: Mood normal.         Behavior: Behavior normal.         Thought Content: Thought content normal.         Judgment: Judgment normal.        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     ASSESSMENT and PLAN    Christy [name] was seen today for follow-up.    Diagnoses and all orders for this visit:    Bipolar 2 disorder, major depressive episode (HCC)  -     lurasidone (LATUDA) 60 MG TABS tablet; Take 1 tablet by mouth Daily with supper  -     venlafaxine (EFFEXOR XR) 75 MG extended release capsule; Take 1 capsule by mouth daily    Tooth abscess  -     doxycycline hyclate (VIBRA-TABS) 100 MG tablet; Take 1 tablet by mouth 2 times daily for 7 days    Chronic bilateral low back pain without sciatica    Pain in both feet  -     External Referral To Podiatry       Assessment & Plan  1. Bipolar 2 disorder with depressive episode.  The patient is tolerating her medications well, initially experiencing side effects with Effexor. She  continues to experience a decreased depressed mood, anxiety, and difficulty sleeping. The Latuda dosage will be increased to 60 mg, while the current doses of Effexor, Vistaril, and trazodone will be maintained.    2. Tooth abscess.  The patient recently broke her left upper molar, resulting in swelling, redness, and pain over the area. There is some cheek swelling noted. She is allergic to PENICILLIN. A 7-day course of doxycycline will be initiated.    3. Lower back pain.  A recent MRI results have been reviewed, revealing a disc herniation at L5-S1. A follow-up appointment with Dr. Jinger Neighbors is scheduled for 10 days for further evaluation and management. She is open to an injection at this point.      4. Bilateral foot pain.  The bilateral foot pain may be related to her diabetes or gait, with the left side being more affected. She has requested a referral to a podiatrist, which has been placed today.    Follow-up  A follow-up appointment is scheduled for 3 months from now for an A1c check.    Plan of care has been discussed, patient agrees with plan; all questions answered.   More than 50% of the time spent in this visit was counseling the patient about  illness and treatment options  Mena Pauls. Christophe Louis DO  Family Medicine  [facility]       PLEASE NOTE:   This document has been produced using voice recognition software. Unrecognized errors in transcription may be present.

## 2023-05-07 NOTE — Progress Notes (Signed)
Christy [name] is a 47 y.o. female (DOB: Dec 08, 1975) presenting to address:    Chief Complaint   Patient presents with    Follow-up     Images for MRI  Broken Tooth- Possibly Infected? Doesn't have a dentist yet       Vitals:    05/07/23 1523   BP: 128/88   Pulse: (!) 108   Resp: 14   Temp: 98 F (36.7 C)   SpO2: 97%       "Have you been to the ER, urgent care clinic since your last visit?  Hospitalized since your last visit?"    NO    "Have you seen or consulted any other health care providers outside of [facility] since your last visit?"    Yes, [facility]       Have you had a mammogram?"   NO    Date of last Mammogram: 09/30/2020      "Have you had a pap smear?"    NO    No cervical cancer screening on file

## 2023-05-17 ENCOUNTER — Encounter
Admit: 2023-05-17 | Payer: PRIVATE HEALTH INSURANCE | Attending: Physical Medicine & Rehabilitation | Primary: General Practice

## 2023-05-17 DIAGNOSIS — G8929 Other chronic pain: Secondary | ICD-10-CM

## 2023-05-17 MED ORDER — TOPIRAMATE 25 MG PO TABS
25 | ORAL_TABLET | Freq: Every evening | ORAL | 3 refills | Status: AC
Start: 2023-05-17 — End: ?

## 2023-05-17 MED ORDER — PREGABALIN 50 MG PO CAPS
50 | ORAL_CAPSULE | ORAL | 0 refills | Status: AC
Start: 2023-05-17 — End: 2023-05-26

## 2023-05-17 NOTE — Progress Notes (Signed)
[facility]  [address], [address]  [place], [place] [address]  Phone: [phone]  Fax: [phone]      Patient: Christy [name]                                                                              MRN: [id number]        Date of Birth: [birth year]          AGE: 47 y.o.             PCP: Chevis Pretty, DO  Date:  05/17/23    Reason for Consultation: Lower Back Pain      HPI:  Christy [name] is a 47 y.o. female with relevant PMH of DM, with peripheral neuropathy, HTN who presents with low back pain radiating down bilateral legs.   Previously she lived in [place] and she was told she has spinal stenosis, she tried epidural injections and pain management.  MRI lumbar spine demonstrates lumbar facet arthritis with mild foraminal narrowing L4/5 L5/S1    The pain began 10 years prior. Denies any precipitating incident or trauma.       Neurologic symptoms: +numbness, tingling- from knees to feet- numbness, tingling entire leg .  No weakness.  bowel or bladder changes- recently reports episodes of incomplete voiding.  No recent falls      Location: The pain is located in the low back    Radiation: The pain does radiate bilateral legs.    Pain Score: Currently: 9/10    Quality: Pain is of a burning, numbness, tingling, tight pulling quality.    Aggravating: Pain is exacerbated by walking, sitting, standing, lying down, and exercise  Alleviating: The pain is alleviated by nothing    Prior Treatments:  Physical therapy: Yes-- 2014 and started physician guided home exercise program 02/27/2023-  3-4 x week until 04/19/2023  Injections:Yes in [place], [place]  > 5 years  Surgery:No  Previous Medications: percocet, flexeril - no relief, tizanadine , pregabalin 100mg  bid- felt it worked better for her but  she is not sure why it was switched back to gabapentin,  gabapentin 300mg  tid  Current Medications:lyrica 100mg  bid   Previous work-up has included:     05/04/2023-   MRI lumbar spine   At the T12/L1 level, no central or foraminal stenosis. Mild to moderate right  facet hypertrophy.     At the L1/L2 level, no central or foraminal stenosis. Moderate bilateral facet  hypertrophy.     At the L2/L3 level, no central or foraminal stenosis. Moderate bilateral facet  hypertrophy.     At the L3/L4 level, no central or foraminal stenosis. Moderate bilateral facet  hypertrophy.     At the L4/L5 level, no central or foraminal stenosis. Marked right and moderate  left facet hypertrophy. Mild narrowing right neural foramen due to facet  encroachment.     At the L5/S1 level, broad-based disc bulge greater posteriorly on the right does  not contact the thecal sac or exiting nerve roots. Bilateral mild foraminal  narrowing due to spondylitic changes. Moderate bilateral facet hypertrophy  X-ray lumbar spine 2020- No spondylolisthesis. No compression deformity.  Mild endplate spondylosis anteriorly, stable. No significant loss of discal height. Facets are intact. No spondylolysis. Pedicles are intact. Moderate fecal retention in the transverse colon.   X-ray thoracic 2019  No spondylolisthesis. No compression deformity. Mild endplate spondylosis anteriorly, stable. No significant loss of discal height. Facets are intact. No spondylolysis. Pedicles are intact. Moderate fecal retention in the transverse colon.   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    Enlarged heart     Hypercholesterolemia     Hypertension     IBS (irritable bowel syndrome)     Interstitial cystitis     Sciatica     Sleep apnea     Spinal stenosis     Thyroid disease       Past Surgical History:   Past Surgical History:   Procedure Laterality Date    CHOLECYSTECTOMY      2008    ORTHOPEDIC SURGERY      LEFT KNEE     ORTHOPEDIC SURGERY      LEFT ARM    PARTIAL HYSTERECTOMY (CERVIX NOT REMOVED)  2003    STILL HAVE OVARIES    PELVIC LAPAROSCOPY      THYROIDECTOMY        SocHx:   Social History     Tobacco Use    Smoking status: Every Day     Current packs/day: 0.25     Average packs/day: 0.3 packs/day for 15.0 years (3.8 ttl pk-yrs)     Types: Cigarettes     Passive exposure: Never    Smokeless tobacco: Never   Substance Use Topics    Alcohol use: Yes     Comment: Not even every month      FamHx:?   Family History   Problem Relation Age of Onset    Diabetes Mother     Heart Disease Mother     Diabetes Father     Cancer Sister     No Known Problems Brother     Heart Disease Maternal Aunt     No Known Problems Maternal Uncle     Cancer Paternal Aunt     No Known Problems Paternal Uncle     No Known Problems Maternal Grandmother     No Known Problems Maternal Grandfather     No Known Problems Paternal Grandmother     No Known Problems Paternal Grandfather     No Known Problems Other        Current Medications:   Current Outpatient Medications   Medication Sig Dispense Refill    pregabalin (LYRICA) 50 MG capsule Take 1 capsule by mouth 3 times daily for 3 days, THEN 1 capsule 2 times daily for 3 days, THEN 1 capsule daily for 3 days. Max Daily Amount: 150 mg. 18 capsule 0    topiramate (TOPAMAX) 25 MG tablet Take 1 tablet by mouth nightly 60 tablet 3    lurasidone (LATUDA) 60 MG TABS tablet Take 1 tablet by mouth Daily with supper 90 tablet 1    venlafaxine (EFFEXOR XR) 75 MG extended release capsule Take 1 capsule by mouth daily 90 capsule 3  olmesartan (BENICAR) 40 MG tablet Take 1 tablet by mouth daily 90 tablet 3    hydrOXYzine pamoate (VISTARIL) 50 MG capsule Take 1 capsule by mouth 3 times daily as needed for Anxiety 90 capsule 2    traZODone (DESYREL) 50 MG tablet Take 1 tablet by mouth nightly (Patient taking differently: Take 3 tablets by mouth nightly) 90 tablet 1    carvedilol (COREG) 25 MG tablet Take 1 tablet by mouth 2  times daily 60 tablet 3    tiZANidine (ZANAFLEX) 4 MG tablet Take 1 tablet by mouth 3 times daily as needed (back pain) 30 tablet 2    vitamin D (ERGOCALCIFEROL) 1.25 MG (50000 UT) CAPS capsule Take 1 capsule by mouth once a week 12 capsule 1    metFORMIN (GLUCOPHAGE) 1000 MG tablet TAKE 1 TABLET 2 TIMES DAILY (WITH MEALS) FOR 90 DAYS. INDICATIONS: TYPE 2 DIABETES MELLITUS 180 tablet 3    atorvastatin (LIPITOR) 40 MG tablet TAKE 1 TABLET BY MOUTH NIGHTLY FOR 90 DAYS. INDICATIONS: HIGH CHOLESTEROL 90 tablet 3    Dulaglutide 4.5 MG/0.5ML SOPN Inject 4.5 mg into the skin once a week Indications: Diabetes 12 Adjustable Dose Pre-filled Pen Syringe 3    dapagliflozin (FARXIGA) 10 MG tablet Take 1 tablet by mouth daily 90 tablet 3    levothyroxine (SYNTHROID) 200 MCG tablet Take 1 tablet by mouth every morning (before breakfast) 90 tablet 1    cloNIDine (CATAPRES) 0.3 MG tablet TAKE 1 TABLET BY MOUTH TWO TIMES A DAY. (Patient taking differently: daily) 180 tablet 3    Lancets MISC Use to test blood sugar once daily      aspirin 81 MG EC tablet Take 1 tablet by mouth daily      linaclotide (LINZESS) 290 MCG CAPS capsule Take 1 capsule by mouth every morning (before breakfast)      pantoprazole (PROTONIX) 40 MG tablet Take 1 tablet by mouth 2 times daily      nitroGLYCERIN (NITROSTAT) 0.4 MG SL tablet Place 1 tablet under the tongue every 5 minutes as needed       No current facility-administered medications for this visit.      Allergies:    Allergies   Allergen Reactions    Lisinopril Cough    Hydrocodone-Acetaminophen Itching    Hydrocodone     Lisinopril-Hydrochlorothiazide Other (See Comments)     Dry cough     Penicillins Other (See Comments)        Hemoglobin A1C   Date Value Ref Range Status   03/16/2021 8.8 (H) 4.2 - 5.6 % Final     Comment:     (NOTE)  HbA1C Interpretive Ranges  <5.7              Normal  5.7 - 6.4         Consider Prediabetes  >6.5              Consider Diabetes         Assessment:   1. Chronic  bilateral low back pain with bilateral sciatica  -     pregabalin (LYRICA) 50 MG capsule; Take 1 capsule by mouth 3 times daily for 3 days, THEN 1 capsule 2 times daily for 3 days, THEN 1 capsule daily for 3 days. Max Daily Amount: 150 mg., Disp-18 capsule, R-0Normal  -     topiramate (TOPAMAX) 25 MG tablet; Take 1 tablet by mouth nightly, Disp-60 tablet, R-3Normal  2. Facet arthritis, degenerative, lumbar spine  -  [facility] - Wynelle Beckmann, MD, Physical Medicine & Rehab, [facility])           Plan:      -Physical therapy -  physician guided home exercises for core strengthening, LE flexibility and lumbar pelvic stabilization provided  -Medications - will taper off of lyrica by 50mg  every 3 days    -try topamax 25mg  qhs  -Diagnostics/Imaging -MRI lumbar spine- to review  -Injections - consider ESI    -Lifestyle - Encouraged regular exercise and weight loss   -Education - The patient's diagnosis, prognosis and treatment options were discussed today. All questions were answered.    F/U -MBB consult        Anna-Christina Roxie Kreeger MD  [facility]    Documentation:  I communicated with the patient and/or health care decision maker about low back pain.   Details of this discussion including any medical advice provided:     Total Time: minutes: 11-20 minutes 15    Christy [name] was evaluated through a synchronous (real-time) audio encounter. Patient identification was verified at the start of the visit. She (or guardian if applicable) is aware that this is a billable service, which includes applicable co-pays. This visit was conducted with the patient's (and/or legal guardian's) verbal consent. She has not had a related appointment within my department in the past 7 days or scheduled within the next 24 hours.   The patient was located at Home: [address]  [place] [address].  The provider was located at [facility] (Appt Dept): [address]  [place],  [place] [address].    Note: not billable if this call serves to triage the patient into an appointment for the relevant concern  Yes, I confirm.   Christy [name] is a 48 y.o. female evaluated via telephone on 05/17/2023 for Lower Back Pain  .        Lennox Pippins, MD

## 2023-05-23 MED ORDER — CARVEDILOL 25 MG PO TABS
25 MG | ORAL_TABLET | Freq: Two times a day (BID) | ORAL | 11 refills | Status: DC
Start: 2023-05-23 — End: 2024-01-22

## 2023-05-25 ENCOUNTER — Ambulatory Visit: Payer: PRIVATE HEALTH INSURANCE | Attending: Internal Medicine | Primary: General Practice

## 2023-06-11 NOTE — Telephone Encounter (Signed)
Referral was placed for consult evaluation with Dr. Wilford Corner, she has not heard about scheduling - can you call and get her scheduled

## 2023-06-13 NOTE — Telephone Encounter (Signed)
Can she schedule in person or virtual follow up and we can try to see if there is another medication to try

## 2023-06-25 ENCOUNTER — Encounter

## 2023-06-27 ENCOUNTER — Telehealth
Admit: 2023-06-27 | Discharge: 2023-06-27 | Payer: PRIVATE HEALTH INSURANCE | Attending: Physical Medicine & Rehabilitation | Primary: General Practice

## 2023-06-27 DIAGNOSIS — M5442 Lumbago with sciatica, left side: Secondary | ICD-10-CM

## 2023-06-27 NOTE — Progress Notes (Signed)
 Christy [name] presents today for   Chief Complaint   Patient presents with    Back Problem    Pain    Back Pain       Is someone accompanying this pt? no    Is the patient using any DME equipment during OV? no    Depression Screening:       No data to display                Learning Assessment:  Failed to redirect to the Timeline version of the REVFS SmartLink.    Abuse Screening:       No data to display                Fall Risk  Failed to redirect to the Timeline version of the REVFS SmartLink.    OPIOID RISK TOOL  Failed to redirect to the Timeline version of the REVFS SmartLink.    Coordination of Care:  1. Have you been to the ER, urgent care clinic since your last visit? no  Hospitalized since your last visit? no    2. Have you seen or consulted any other health care providers outside of the [facility] since your last visit? no Include any pap smears or colon screening. no

## 2023-06-27 NOTE — Progress Notes (Addendum)
 [facility]  [address]  [place], [place] [address]  Phone: [phone]  Fax: [phone]      Patient: Christy [name]                                                                              MRN: [id number]        Date of Birth: [birth year]          AGE: 47 y.o.             PCP: Maryjane Nola RAMAN, DO  Date:  06/27/23    Reason for Consultation: Back Problem, Pain, and Back Pain      HPI:  Christy [name] is a 47 y.o. female with relevant PMH of DM, with peripheral neuropathy, HTN who presented with low back pain radiating down bilateral legs.   Previously she lived in [place] and she was told she has spinal stenosis, she tried epidural injections and pain management.  MRI lumbar spine demonstrates lumbar facet arthritis with mild foraminal narrowing L4/5 L5/S1.  She has tried several medications without relief.      The pain began 10 years prior. Denies any precipitating incident or trauma.       Neurologic symptoms: +numbness, tingling- from knees to feet- numbness, tingling entire leg .  No weakness.  bowel or bladder changes- recently reports episodes of incomplete voiding.  No recent falls      Location: The pain is located in the low back    Radiation: The pain does radiate bilateral legs.    Pain Score: Currently: 9/10    Quality: Pain is of a burning, numbness, tingling, tight pulling quality.    Aggravating: Pain is exacerbated by walking, sitting, standing, lying down, and exercise  Alleviating: The pain is alleviated by nothing    Prior Treatments:  Physical therapy: Yes-- 2014 and started physician guided home exercise program 02/27/2023-  3-4 x week until 04/19/2023  Injections:Yes in [place], [place]  > 5 years  Surgery:No  Previous Medications: percocet, flexeril  - no relief, tizanadine , pregabalin  100mg  bid-   gabapentin  300mg  tid  Current Medications:topamax    Previous work-up has included:     05/04/2023-  MRI lumbar spine   At the  T12/L1 level, no central or foraminal stenosis. Mild to moderate right  facet hypertrophy.     At the L1/L2 level, no central or foraminal stenosis. Moderate bilateral facet  hypertrophy.     At the L2/L3 level, no central or foraminal stenosis. Moderate bilateral facet  hypertrophy.     At the L3/L4 level, no central or foraminal stenosis. Moderate bilateral facet  hypertrophy.     At the L4/L5 level, no central or foraminal stenosis. Marked right and moderate  left facet hypertrophy. Mild narrowing right neural foramen due to facet  encroachment.     At the L5/S1 level, broad-based disc bulge greater posteriorly on the right does  not contact the thecal sac or exiting nerve roots. Bilateral mild foraminal  narrowing due to spondylitic changes. Moderate bilateral facet hypertrophy  X-ray lumbar spine 2020- No spondylolisthesis. No compression deformity. Mild endplate spondylosis anteriorly, stable. No significant  loss of discal height. Facets are intact. No spondylolysis. Pedicles are intact. Moderate fecal retention in the transverse colon.   X-ray thoracic 2019  No spondylolisthesis. No compression deformity. Mild endplate spondylosis anteriorly, stable. No significant loss of discal height. Facets are intact. No spondylolysis. Pedicles are intact. Moderate fecal retention in the transverse colon.   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    Enlarged heart     Hypercholesterolemia     Hypertension     IBS (irritable bowel syndrome)     Interstitial cystitis     Sciatica     Sleep apnea     Spinal stenosis     Thyroid disease       Past Surgical History:   Past Surgical History:   Procedure Laterality Date    CHOLECYSTECTOMY      2008    ORTHOPEDIC SURGERY      LEFT KNEE    ORTHOPEDIC SURGERY      LEFT  ARM    PARTIAL HYSTERECTOMY (CERVIX NOT REMOVED)  2003    STILL HAVE OVARIES    PELVIC LAPAROSCOPY      THYROIDECTOMY        SocHx:   Social History     Tobacco Use    Smoking status: Every Day     Current packs/day: 0.25     Average packs/day: 0.3 packs/day for 15.0 years (3.8 ttl pk-yrs)     Types: Cigarettes     Passive exposure: Never    Smokeless tobacco: Never   Substance Use Topics    Alcohol use: Yes     Comment: Not even every month      FamHx:?   Family History   Problem Relation Age of Onset    Diabetes Mother     Heart Disease Mother     Diabetes Father     Cancer Sister     No Known Problems Brother     Heart Disease Maternal Aunt     No Known Problems Maternal Uncle     Cancer Paternal Aunt     No Known Problems Paternal Uncle     No Known Problems Maternal Grandmother     No Known Problems Maternal Grandfather     No Known Problems Paternal Grandmother     No Known Problems Paternal Grandfather     No Known Problems Other        Current Medications:   Current Outpatient Medications   Medication Sig Dispense Refill    methocarbamol (ROBAXIN) 750 MG tablet TAKE 1 TAB BY MOUTH EVERY 4 HOURS AS NEEDED FOR UP TO 10 DAYS.      carvedilol  (COREG ) 25 MG tablet TAKE 1 TABLET BY MOUTH TWICE A DAY 60 tablet 11    lurasidone  (LATUDA ) 60 MG TABS tablet Take 1 tablet by mouth Daily with supper 90 tablet 1    venlafaxine  (EFFEXOR  XR) 75 MG extended release capsule Take 1 capsule by mouth daily 90 capsule 3    olmesartan  (BENICAR ) 40 MG tablet Take 1 tablet by mouth daily 90 tablet 3    hydrOXYzine  pamoate (VISTARIL ) 50 MG capsule  Take 1 capsule by mouth 3 times daily as needed for Anxiety 90 capsule 2    traZODone  (DESYREL ) 50 MG tablet Take 1 tablet by mouth nightly (Patient taking differently: Take 3 tablets by mouth nightly) 90 tablet 1    tiZANidine  (ZANAFLEX ) 4 MG tablet Take 1 tablet by mouth 3 times daily as needed (back pain) 30 tablet 2    vitamin D  (ERGOCALCIFEROL ) 1.25 MG (50000 UT) CAPS capsule Take 1  capsule by mouth once a week 12 capsule 1    metFORMIN  (GLUCOPHAGE ) 1000 MG tablet TAKE 1 TABLET 2 TIMES DAILY (WITH MEALS) FOR 90 DAYS. INDICATIONS: TYPE 2 DIABETES MELLITUS 180 tablet 3    atorvastatin  (LIPITOR) 40 MG tablet TAKE 1 TABLET BY MOUTH NIGHTLY FOR 90 DAYS. INDICATIONS: HIGH CHOLESTEROL 90 tablet 3    Dulaglutide  4.5 MG/0.5ML SOPN Inject 4.5 mg into the skin once a week Indications: Diabetes 12 Adjustable Dose Pre-filled Pen Syringe 3    dapagliflozin  (FARXIGA ) 10 MG tablet Take 1 tablet by mouth daily 90 tablet 3    levothyroxine  (SYNTHROID ) 200 MCG tablet Take 1 tablet by mouth every morning (before breakfast) 90 tablet 1    cloNIDine  (CATAPRES ) 0.3 MG tablet TAKE 1 TABLET BY MOUTH TWO TIMES A DAY. 180 tablet 3    Lancets MISC Use to test blood sugar once daily      aspirin  81 MG EC tablet Take 1 tablet by mouth daily      linaclotide  (LINZESS ) 290 MCG CAPS capsule Take 1 capsule by mouth every morning (before breakfast)      nitroGLYCERIN  (NITROSTAT ) 0.4 MG SL tablet Place 1 tablet under the tongue every 5 minutes as needed      pantoprazole  (PROTONIX ) 40 MG tablet Take 1 tablet by mouth 2 times daily       No current facility-administered medications for this visit.      Allergies:    Allergies   Allergen Reactions    Lisinopril Cough    Hydrocodone-Acetaminophen  Itching    Hydrocodone     Lisinopril-Hydrochlorothiazide Other (See Comments)     Dry cough     Penicillins Other (See Comments)        Hemoglobin A1C   Date Value Ref Range Status   03/16/2021 8.8 (H) 4.2 - 5.6 % Final     Comment:     (NOTE)  HbA1C Interpretive Ranges  <5.7              Normal  5.7 - 6.4         Consider Prediabetes  >6.5              Consider Diabetes         Assessment:   1. Chronic bilateral low back pain with bilateral sciatica  -     External Referral To Pain Clinic  2. Facet arthritis, degenerative, lumbar spine             Plan:      -Physical therapy -  physician guided home exercises for core strengthening, LE  flexibility and lumbar pelvic stabilization provided  -Medications - d/c topamax , d/c lyrica  no relief   -Referral placed for pain management- she is hopefully if she has a spine injection or MBB/RFA she will not need medications   -Diagnostics/Imaging -MRI lumbar spine- to review  -Injections - Referral to evaluate for MBB/Consult  -Lifestyle - Encouraged regular exercise and weight loss   -Education - The patient's diagnosis, prognosis and treatment  options were discussed today. All questions were answered.    F/U -MBB consult        Anna-Christina Saanvika Vazques MD  [facility]    Documentation:  I communicated with the patient and/or health care decision maker about low back pain.   Details of this discussion including any medical advice provided:     Total Time: minutes: 11-20 minutes 13    Christy [name] was evaluated through a synchronous (real-time) audio encounter. Patient identification was verified at the start of the visit. She (or guardian if applicable) is aware that this is a billable service, which includes applicable co-pays. This visit was conducted with the patient's (and/or legal guardian's) verbal consent. She has not had a related appointment within my department in the past 7 days or scheduled within the next 24 hours.   The patient was located at Home: [address]  [place] [address].  The provider was located at [employer] (Appt Dept): [address]  [address]  [place],  [place] [address].    Note: not billable if this call serves to triage the patient into an appointment for the relevant concern  Yes, I confirm.   Christy [name] is a 47 y.o. female evaluated via telephone on 06/27/2023 for Back Problem, Pain, and Back Pain  .        Anna-Christina Dreshaun Stene, MD

## 2023-07-10 MED ORDER — CLONIDINE HCL 0.3 MG PO TABS
0.3 | ORAL_TABLET | Freq: Two times a day (BID) | ORAL | 3 refills | Status: DC
Start: 2023-07-10 — End: 2024-01-22

## 2023-07-10 NOTE — Telephone Encounter (Signed)
 Last visit: 05/07/23  Next visit:   Future Appointments   Date Time Provider Department Center   08/08/2023 10:40 AM Jeanie Sewer, MD [facility] AMB     Last filled: 05/24/22; clonidine 0.3 mg tab; qty 180 w/3 refills

## 2023-07-16 ENCOUNTER — Encounter: Payer: PRIVATE HEALTH INSURANCE | Attending: General Practice | Primary: General Practice

## 2023-07-18 ENCOUNTER — Ambulatory Visit
Admit: 2023-07-18 | Discharge: 2023-07-18 | Payer: PRIVATE HEALTH INSURANCE | Attending: General Practice | Primary: General Practice

## 2023-07-18 VITALS — BP 170/110 | HR 118 | Temp 98.10000°F | Resp 14 | Ht 60.0 in | Wt 203.6 lb

## 2023-07-18 DIAGNOSIS — E119 Type 2 diabetes mellitus without complications: Secondary | ICD-10-CM

## 2023-07-18 MED ORDER — LURASIDONE HCL 60 MG PO TABS
60 | ORAL_TABLET | Freq: Every day | ORAL | 3 refills | Status: DC
Start: 2023-07-18 — End: 2023-08-08

## 2023-07-18 MED ORDER — GLUCOSE MONITORING KIT
PACK | Freq: Every day | 0 refills | Status: AC
Start: 2023-07-18 — End: ?

## 2023-07-18 MED ORDER — METHYLPREDNISOLONE 4 MG PO TBPK
4 | PACK | ORAL | 0 refills | Status: AC
Start: 2023-07-18 — End: 2023-07-24

## 2023-07-18 NOTE — Progress Notes (Signed)
 Christy [name] is a 47 y.o. female (DOB: 1975/11/28) presenting to address:    Chief Complaint   Patient presents with    Pain     Pain worsening all over body       Vitals:    07/18/23 1044   BP: (!) 170/110   Pulse: (!) 118   Resp: 14   Temp: 98.1 F (36.7 C)   SpO2: 98%       Have you been to the ER, urgent care clinic since your last visit?  Hospitalized since your last visit?    YES - When: approximately 1 months ago.  Where and Why: [facility] ED for pain in right side.  [facility] [place] ED for pain in side  "Have you seen or consulted any other health care providers outside of [facility] since your last visit?"    NO       Have you had a mammogram?"   NO    Date of last Mammogram: 09/30/2020      "Have you had a pap smear?"    NO    No cervical cancer screening on file

## 2023-07-18 NOTE — Progress Notes (Signed)
 [facility]      MR#: [id number]    HISTORY OF PRESENT ILLNESS  History of Present Illness  The patient is a 47 year old female who presents for pain follow-up and diabetes follow-up.    She reports experiencing widespread body pain that has been escalating over the past month, despite continuing her work routine. Her mobility has been significantly affected in the last 4 weeks, necessitating the use of a cane. She has not had any fevers or chills but admits to feeling weak and experiencing instability in her knee. Her current medication regimen includes Effexor  and Latuda , with an upcoming appointment scheduled for October 2024. Previous treatments with Lyrica  and gabapentin  have not provided relief, although she did not experience any side effects from these medications. She describes her daily pain level as severe, rating it a 10 on the pain scale. She has used steroids in the past but not recently. She occasionally feels unwell and experiences nausea. She does not have any skin rashes but notes the sporadic appearance of red spots on her leg. She also reports tenderness in various parts of her body, difficulty sleeping, fatigue, neck pain, and stiffness. She has initiated the process for disability due to her inability to sit, stand, or walk without discomfort. She expresses fear and a sense of hopelessness, likening her condition to her body "locking up" on her.    She requests a new glucose meter as her current one is malfunctioning.    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, cleans      Health maintenance:  Colon cancer screening: Due in 2032  Breast  cancer screening: Due, ordered  Cervical cancer screening: No longer has cervix   Lung cancer screening: At 50  COVID: Pfizer x 3  Influenza: UTD  PCV: PPSV23 in 2020  Shingles: At 50      Current Outpatient Medications:     lurasidone  (LATUDA ) 60 MG TABS tablet, Take 1 tablet by mouth Daily with supper, Disp: 90 tablet, Rfl: 3    methylPREDNISolone  (MEDROL  DOSEPACK) 4 MG tablet, Take by mouth., Disp: 1 kit, Rfl: 0    glucose monitoring kit, 1 kit by Does not apply route daily, Disp: 1 kit, Rfl: 0    cloNIDine  (CATAPRES ) 0.3 MG tablet, Take 1 tablet by mouth 2 times daily, Disp: 180 tablet, Rfl: 3    methocarbamol (ROBAXIN) 750 MG tablet, TAKE 1 TAB BY MOUTH EVERY 4 HOURS AS NEEDED FOR UP TO 10 DAYS., Disp: , Rfl:     carvedilol  (COREG ) 25 MG tablet, TAKE 1 TABLET BY MOUTH TWICE A DAY, Disp: 60 tablet, Rfl: 11    venlafaxine  (EFFEXOR  XR) 75 MG extended release capsule, Take 1 capsule by mouth daily, Disp: 90 capsule, Rfl: 3    olmesartan  (BENICAR ) 40 MG tablet, Take 1 tablet by mouth daily, Disp: 90 tablet, Rfl: 3    hydrOXYzine  pamoate (VISTARIL ) 50 MG capsule, Take 1 capsule by mouth 3 times daily as needed for Anxiety, Disp: 90 capsule, Rfl: 2    traZODone  (DESYREL ) 50 MG tablet, Take 1 tablet by mouth  nightly (Patient taking differently: Take 3 tablets by mouth nightly), Disp: 90 tablet, Rfl: 1    tiZANidine  (ZANAFLEX ) 4 MG tablet, Take 1 tablet by mouth 3 times daily as needed (back pain), Disp: 30 tablet, Rfl: 2    vitamin D  (ERGOCALCIFEROL ) 1.25 MG (50000 UT) CAPS capsule, Take 1 capsule by mouth once a week, Disp: 12 capsule, Rfl: 1    metFORMIN  (GLUCOPHAGE ) 1000 MG tablet, TAKE 1 TABLET 2 TIMES DAILY (WITH MEALS) FOR 90 DAYS. INDICATIONS: TYPE 2 DIABETES MELLITUS, Disp: 180 tablet, Rfl: 3    atorvastatin  (LIPITOR) 40 MG tablet, TAKE 1 TABLET BY MOUTH NIGHTLY FOR 90 DAYS. INDICATIONS: HIGH CHOLESTEROL, Disp: 90 tablet, Rfl: 3    Dulaglutide  4.5 MG/0.5ML SOPN, Inject 4.5 mg into the skin once a week  Indications: Diabetes, Disp: 12 Adjustable Dose Pre-filled Pen Syringe, Rfl: 3    dapagliflozin  (FARXIGA ) 10 MG tablet, Take 1 tablet by mouth daily, Disp: 90 tablet, Rfl: 3    levothyroxine  (SYNTHROID ) 200 MCG tablet, Take 1 tablet by mouth every morning (before breakfast), Disp: 90 tablet, Rfl: 1    aspirin  81 MG EC tablet, Take 1 tablet by mouth daily, Disp: , Rfl:     linaclotide  (LINZESS ) 290 MCG CAPS capsule, Take 1 capsule by mouth every morning (before breakfast), Disp: , Rfl:     nitroGLYCERIN  (NITROSTAT ) 0.4 MG SL tablet, Place 1 tablet under the tongue every 5 minutes as needed, Disp: , Rfl:     pantoprazole  (PROTONIX ) 40 MG tablet, Take 1 tablet by mouth 2 times daily, Disp: , Rfl:     Lancets MISC, Use to test blood sugar once daily (Patient not taking: Reported on 07/18/2023), Disp: , Rfl:     Review of Systems   Constitutional:  Positive for fatigue. Negative for appetite change, chills, fever and unexpected weight change.   HENT:  Negative for congestion, rhinorrhea and sore throat.    Eyes:  Negative for pain.   Respiratory:  Negative for cough, chest tightness and shortness of breath.    Cardiovascular:  Negative for chest pain, palpitations and leg swelling.   Gastrointestinal:  Negative for abdominal pain, constipation, diarrhea, nausea and vomiting.   Genitourinary:  Negative for dysuria and frequency.   Musculoskeletal:  Positive for arthralgias, back pain, joint swelling, myalgias and neck pain. Negative for gait problem and neck stiffness.   Skin:  Negative for rash and wound.   Neurological:  Negative for dizziness and weakness.   Psychiatric/Behavioral:  Positive for sleep disturbance.      BP (!) 170/110 (Site: Left Upper Arm, Position: Sitting, Cuff Size: Medium Adult)   Pulse (!) 118   Temp 98.1 F (36.7 C) (Skin)   Resp 14   Ht 1.524 m (5')   Wt 92.4 kg (203 lb 9.6 oz)   SpO2 98%   BMI 39.76 kg/m     Physical Exam  Constitutional:       General: She is not in acute distress.      Appearance: Normal appearance. She is not ill-appearing or toxic-appearing.   HENT:      Head: Normocephalic and atraumatic.      Right Ear: Tympanic membrane, ear canal and external ear normal.      Left Ear: Tympanic membrane, ear canal and external ear normal.      Nose: Nose normal. No rhinorrhea.      Mouth/Throat:      Mouth: Mucous membranes are moist.      Pharynx:  Oropharynx is clear. No posterior oropharyngeal erythema.   Eyes:      Extraocular Movements: Extraocular movements intact.      Conjunctiva/sclera: Conjunctivae normal.      Pupils: Pupils are equal, round, and reactive to light.   Cardiovascular:      Rate and Rhythm: Normal rate and regular rhythm.      Pulses: Normal pulses.      Heart sounds: Normal heart sounds. No murmur heard.     No friction rub. No gallop.   Pulmonary:      Effort: Pulmonary effort is normal. No respiratory distress.      Breath sounds: Normal breath sounds. No wheezing, rhonchi or rales.   Abdominal:      General: Abdomen is flat. Bowel sounds are normal.      Palpations: Abdomen is soft.      Tenderness: There is no abdominal tenderness. There is no guarding.   Musculoskeletal:         General: Normal range of motion.      Right shoulder: Normal.      Left shoulder: Normal.      Cervical back: Normal range of motion and neck supple. Tenderness present. No rigidity, torticollis, bony tenderness or crepitus. Pain with movement present.      Thoracic back: Tenderness present. No lacerations, spasms or bony tenderness. Normal range of motion. No scoliosis.      Lumbar back: No tenderness or bony tenderness. Normal range of motion.      Right hip: Normal.      Left hip: Normal.   Lymphadenopathy:      Cervical: No cervical adenopathy.   Skin:     General: Skin is warm.      Capillary Refill: Capillary refill takes less than 2 seconds.      Findings: No erythema or rash.   Neurological:      General: No focal deficit present.      Mental Status: She is alert and oriented to  person, place, and time. Mental status is at baseline.      Motor: No weakness.      Gait: Gait normal.   Psychiatric:         Mood and Affect: Mood normal.         Behavior: Behavior normal.         Thought Content: Thought content normal.         Judgment: Judgment normal.        Results  Laboratory Studies  A1c is 6.9.     ASSESSMENT and PLAN    Christy [name] was seen today for pain.    Diagnoses and all orders for this visit:    Type 2 diabetes mellitus without complication, without long-term current use of insulin  (HCC)  -     AMB POC HEMOGLOBIN A1C  -     glucose monitoring kit; 1 kit by Does not apply route daily    Bipolar 2 disorder, major depressive episode (HCC)  -     lurasidone  (LATUDA ) 60 MG TABS tablet; Take 1 tablet by mouth Daily with supper    Chronic bilateral low back pain without sciatica  -     methylPREDNISolone  (MEDROL  DOSEPACK) 4 MG tablet; Take by mouth.  -     CBC with Auto Differential; Future  -     Comprehensive Metabolic Panel; Future  -     C-Reactive Protein; Future  -     Sedimentation Rate; Future  -  TSH with Reflex; Future  -     ANCA Panel; Future  -     ANA By Multiplex Flow IA, QL; Future    Degenerative disc disease, thoracic  -     methylPREDNISolone  (MEDROL  DOSEPACK) 4 MG tablet; Take by mouth.  -     CBC with Auto Differential; Future  -     Comprehensive Metabolic Panel; Future  -     C-Reactive Protein; Future  -     Sedimentation Rate; Future  -     TSH with Reflex; Future  -     ANCA Panel; Future  -     ANA By Multiplex Flow IA, QL; Future    Myalgia  -     CBC with Auto Differential; Future  -     Comprehensive Metabolic Panel; Future  -     C-Reactive Protein; Future  -     Sedimentation Rate; Future  -     TSH with Reflex; Future  -     ANCA Panel; Future  -     ANA By Multiplex Flow IA, QL; Future  -     Vitamin D  25 Hydroxy; Future  -     Vitamin B12; Future    Generalized weakness  -     CBC with Auto Differential; Future  -     Comprehensive Metabolic Panel;  Future  -     C-Reactive Protein; Future  -     Sedimentation Rate; Future  -     TSH with Reflex; Future  -     ANCA Panel; Future  -     ANA By Multiplex Flow IA, QL; Future  -     Vitamin D  25 Hydroxy; Future  -     Vitamin B12; Future    Hypothyroidism, unspecified type  -     TSH with Reflex; Future    Vitamin D  deficiency  -     Vitamin D  25 Hydroxy; Future       Assessment & Plan  1.  Generalized fatigue and body pain with myalgias  The patient's symptoms, including widespread body pain, stiffness, and fatigue, suggest a possible diagnosis of polymyalgia rheumatica versus fibromyalgia versus known lower back pain. A Medrol  Dosepak has been prescribed for her to take over the next 5 to 6 days. Blood work, including ESR, will be conducted to further investigate her condition. She has been advised to inform the clinic if there is no improvement in her symptoms.     2. Diabetes Mellitus.  Her A1c level remains stable at 6.9. A new glucose meter will be provided to her, with the pharmacy instructed to supply the same brand as previously used, covered by her insurance.  No changes to her current medications otherwise.    Follow-up  The patient will follow up in 3 to 4 weeks.    Plan of care has been discussed, patient agrees with plan; all questions answered.   More than 50% of the time spent in this visit was counseling the patient about  illness and treatment options         Chinita Cough. Maurilio Southward DO  Family Medicine  [facility]       PLEASE NOTE:   This document has been produced using voice recognition software. Unrecognized errors in transcription may be present.

## 2023-07-19 LAB — AMB POC HEMOGLOBIN A1C: Hemoglobin A1C, POC: 6.9 %

## 2023-07-21 LAB — CBC/DIFF AMBIGUOUS DEFAULT
Basophils %: 1 %
Basophils Absolute: 0.1 10*3/uL (ref 0.0–0.2)
Eosinophils %: 1 %
Eosinophils Absolute: 0.1 10*3/uL (ref 0.0–0.4)
Hematocrit: 40.2 % (ref 34.0–46.6)
Hemoglobin: 12.9 g/dL (ref 11.1–15.9)
Immature Grans (Abs): 0 10*3/uL (ref 0.0–0.1)
Immature Granulocytes %: 1 %
Lymphocytes %: 38 %
Lymphocytes Absolute: 3.2 10*3/uL — ABNORMAL HIGH (ref 0.7–3.1)
MCH: 28.3 pg (ref 26.6–33.0)
MCHC: 32.1 g/dL (ref 31.5–35.7)
MCV: 88 fL (ref 79–97)
Monocytes %: 5 %
Monocytes Absolute: 0.4 10*3/uL (ref 0.1–0.9)
Neutrophils %: 54 %
Neutrophils Absolute: 4.7 10*3/uL (ref 1.4–7.0)
Platelets: 269 10*3/uL (ref 150–450)
RBC: 4.56 x10E6/uL (ref 3.77–5.28)
RDW: 13.6 % (ref 11.7–15.4)
WBC: 8.4 10*3/uL (ref 3.4–10.8)

## 2023-07-21 LAB — COMPREHENSIVE METABOLIC PANEL
ALT: 10 IU/L (ref 0–32)
AST: 9 IU/L (ref 0–40)
Albumin: 4.3 g/dL (ref 3.9–4.9)
Alkaline Phosphatase: 75 IU/L (ref 44–121)
BUN/Creatinine Ratio: 13 (ref 9–23)
BUN: 11 mg/dL (ref 6–24)
CO2: 20 mmol/L (ref 20–29)
Calcium: 9.4 mg/dL (ref 8.7–10.2)
Chloride: 104 mmol/L (ref 96–106)
Creatinine: 0.84 mg/dL (ref 0.57–1.00)
Est, Glom Filt Rate: 87 mL/min/{1.73_m2} (ref >59)
Globulin, Total: 2.8 g/dL (ref 1.5–4.5)
Glucose: 154 mg/dL — ABNORMAL HIGH (ref 70–99)
Potassium: 3.9 mmol/L (ref 3.5–5.2)
Sodium: 140 mmol/L (ref 134–144)
Total Bilirubin: 0.2 mg/dL (ref 0.0–1.2)
Total Protein: 7.1 g/dL (ref 6.0–8.5)

## 2023-07-21 LAB — C-REACTIVE PROTEIN: CRP: <1 mg/L (ref 0–10)

## 2023-07-21 LAB — T4, FREE DIRECT: T4 Free: 1.46 ng/dL (ref 0.82–1.77)

## 2023-07-21 LAB — VITAMIN D 25 HYDROXY: Vit D, 25-Hydroxy: 15 ng/mL — ABNORMAL LOW (ref 30.0–100.0)

## 2023-07-21 LAB — SEDIMENTATION RATE: Sed Rate, Automated: 34 mm/h — ABNORMAL HIGH (ref 0–32)

## 2023-07-21 LAB — TSH REFLEX TO FT4, T3: TSH: 0.178 u[IU]/mL — ABNORMAL LOW (ref 0.450–4.500)

## 2023-07-21 LAB — SPECIMEN STATUS REPORT

## 2023-07-21 LAB — VITAMIN B12: Vitamin B-12: 200 pg/mL — ABNORMAL LOW (ref 232–1245)

## 2023-07-23 ENCOUNTER — Encounter

## 2023-07-23 LAB — ANCA PANEL
Atypical pANCA: <1:20 {titer}
Cytoplasmic (C-ANCA): <1:20 {titer}
Myeloperoxidase Ab: <0.2 U (ref 0.0–0.9)
Perinuclear (P-ANCA): <1:20 {titer}
Proteinase 3 Ab: <0.2 U (ref 0.0–0.9)

## 2023-07-23 MED ORDER — VITAMIN D (ERGOCALCIFEROL) 1.25 MG (50000 UT) PO CAPS
1.25 | ORAL_CAPSULE | ORAL | 1 refills | Status: AC
Start: 2023-07-23 — End: ?

## 2023-07-25 LAB — ANTINUCLEAR AB SCREEN IFA: ANA by IFA: NEGATIVE

## 2023-07-30 ENCOUNTER — Ambulatory Visit
Admit: 2023-07-30 | Discharge: 2023-07-30 | Payer: PRIVATE HEALTH INSURANCE | Attending: General Practice | Primary: General Practice

## 2023-07-30 DIAGNOSIS — R7989 Other specified abnormal findings of blood chemistry: Secondary | ICD-10-CM

## 2023-07-30 MED ORDER — CYANOCOBALAMIN 1000 MCG/ML IJ SOLN
1000 | Freq: Once | INTRAMUSCULAR | Status: AC
Start: 2023-07-30 — End: 2023-07-30

## 2023-07-30 MED ADMIN — cyanocobalamin injection 1,000 mcg: 1000 ug | INTRAMUSCULAR | @ 14:00:00 | NDC 00143962101

## 2023-07-30 NOTE — Progress Notes (Signed)
 After obtaining consent, and per orders of Dr. Christophe Louis, injection of 1 mL was given in Left deltoid by Peterson Ao, LPN. Patient instructed to remain in clinic for 20 minutes afterwards, and to report any adverse reaction to me immediately.

## 2023-08-06 ENCOUNTER — Encounter: Payer: PRIVATE HEALTH INSURANCE | Attending: General Practice | Primary: General Practice

## 2023-08-08 ENCOUNTER — Ambulatory Visit
Admit: 2023-08-08 | Discharge: 2023-08-08 | Payer: PRIVATE HEALTH INSURANCE | Attending: Physical Medicine & Rehabilitation | Primary: General Practice

## 2023-08-08 DIAGNOSIS — M47816 Spondylosis without myelopathy or radiculopathy, lumbar region: Secondary | ICD-10-CM

## 2023-08-08 NOTE — Progress Notes (Unsigned)
[facility]  [address], [address]  [place], [place] [address]  Phone: [phone]  Fax: [phone]        Christy [name]  DOB: 1976-05-29  PCP: Benedetto Goad, DO    MBB/RFA EVALUATION      ASSESSMENT AND PLAN    There are no diagnoses linked to this encounter.     Christy [name] is a 47 y.o. female ***  ***        HISTORY OF PRESENT ILLNESS    Christy [name] is seen today in consultation for MBB/RFA evaluation for chronic LBP. Patient was referred by Lennox Pippins, MD. Notes will be sent to referring provider and PCP Benedetto Goad, DO.     Reviewed notes from Dr. Macarthur Critchley office (05/2023). Patient was seen for B/L LBP radiating down BLE. Given physician-directed HEP to follow as tolerated, ordered L MRI, referred for MBB consultation.              06/27/2023     4:08 PM   AMB PAIN ASSESSMENT   Location of Pain Back   Severity of Pain 10   Quality of Pain Dull;Aching;Sharp;Throbbing   Duration of Pain Persistent   Frequency of Pain Constant   Aggravating Factors Standing;Bending;Stretching;Walking;Squatting;Stairs;Kneeling;Exercise;Straightening   Limiting Behavior Yes   Relieving Factors Other (Comment)   Result of Injury No       Onset of pain: ***    Investigations:   L MRI (03/2023):   Spine surgery consult: ***    Treatments:  Physical therapy: ***  Spinal injections: ***  Spinal surgery: ***  Beneficial medications: ***  Failed medications: ***    Work Status: ***  Pertinent PMHx:  ***.     PHYSICAL EXAM    There were no vitals taken for this visit.    {DAAMBULATION:72160::"Ambulation without assistive device."}  TTP: ***  UE strength: Manual muscle testing of upper extremity deltoids, triceps, biceps, wrist flexors, wrist extensors, and intrinsics intact.***  UE DTR: ***    TTP: ***  LE strength: Manual muscle testing of the lower extremities hip flexors, hamstrings, quadriceps, dorsiflexors, plantar flexors, and EHL intact.***  SLR:  ***  LE DTR: ***  No edema     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    Enlarged heart     Hypercholesterolemia     Hypertension     IBS (irritable bowel syndrome)     Interstitial cystitis     Sciatica     Sleep apnea     Spinal stenosis     Thyroid disease        Past Surgical History:   Procedure Laterality Date    CHOLECYSTECTOMY      2008    ORTHOPEDIC SURGERY      LEFT KNEE    ORTHOPEDIC SURGERY      LEFT ARM    PARTIAL HYSTERECTOMY (CERVIX NOT REMOVED)  2003    STILL HAVE OVARIES    PELVIC LAPAROSCOPY      THYROIDECTOMY         Current Outpatient Medications   Medication Sig Dispense Refill  traZODone (DESYREL) 150 MG tablet Take 1 tablet by mouth nightly      vitamin D (ERGOCALCIFEROL) 1.25 MG (50000 UT) CAPS capsule Take 1 capsule by mouth once a week 12 capsule 1    lurasidone (LATUDA) 60 MG TABS tablet Take 1 tablet by mouth Daily with supper (Patient not taking: Reported on 07/30/2023) 90 tablet 3    glucose monitoring kit 1 kit by Does not apply route daily 1 kit 0    cloNIDine (CATAPRES) 0.3 MG tablet Take 1 tablet by mouth 2 times daily 180 tablet 3    methocarbamol (ROBAXIN) 750 MG tablet TAKE 1 TAB BY MOUTH EVERY 4 HOURS AS NEEDED FOR UP TO 10 DAYS.      carvedilol (COREG) 25 MG tablet TAKE 1 TABLET BY MOUTH TWICE A DAY 60 tablet 11    venlafaxine (EFFEXOR XR) 75 MG extended release capsule Take 1 capsule by mouth daily 90 capsule 3    olmesartan (BENICAR) 40 MG tablet Take 1 tablet by mouth daily 90 tablet 3    hydrOXYzine pamoate (VISTARIL) 50 MG capsule Take 1 capsule by mouth 3 times daily as needed for Anxiety 90 capsule 2    traZODone (DESYREL) 50 MG tablet Take 1 tablet by mouth nightly (Patient not taking: Reported on 07/30/2023) 90 tablet 1    tiZANidine (ZANAFLEX) 4 MG tablet Take 1 tablet by mouth  3 times daily as needed (back pain) 30 tablet 2    metFORMIN (GLUCOPHAGE) 1000 MG tablet TAKE 1 TABLET 2 TIMES DAILY (WITH MEALS) FOR 90 DAYS. INDICATIONS: TYPE 2 DIABETES MELLITUS 180 tablet 3    atorvastatin (LIPITOR) 40 MG tablet TAKE 1 TABLET BY MOUTH NIGHTLY FOR 90 DAYS. INDICATIONS: HIGH CHOLESTEROL 90 tablet 3    Dulaglutide 4.5 MG/0.5ML SOPN Inject 4.5 mg into the skin once a week Indications: Diabetes 12 Adjustable Dose Pre-filled Pen Syringe 3    dapagliflozin (FARXIGA) 10 MG tablet Take 1 tablet by mouth daily 90 tablet 3    levothyroxine (SYNTHROID) 200 MCG tablet Take 1 tablet by mouth every morning (before breakfast) 90 tablet 1    Lancets MISC Use to test blood sugar once daily (Patient not taking: Reported on 07/30/2023)      aspirin 81 MG EC tablet Take 1 tablet by mouth daily      linaclotide (LINZESS) 290 MCG CAPS capsule Take 1 capsule by mouth every morning (before breakfast)      nitroGLYCERIN (NITROSTAT) 0.4 MG SL tablet Place 1 tablet under the tongue every 5 minutes as needed      pantoprazole (PROTONIX) 40 MG tablet Take 1 tablet by mouth 2 times daily       No current facility-administered medications for this visit.         ***    I, Wynelle Beckmann, MD, personally performed the services described in this documentation. I have authorized the scribe to complete the medical record entries input within this chart. I have reviewed the chart and agree that the record reflects my personal performance and is accurate and complete. [Electronically Signed: Wynelle Beckmann, MD. 08/07/2023 10:33 PM ]    Portions of this note was created using Dragon transcription software and may contain unintended errors.

## 2023-08-08 NOTE — Patient Instructions (Signed)
Learning About Medial Branch Block and Neurotomy  What are medial branch block and neurotomy?     Facet joints connect your vertebrae to each other. Problems in these joints can cause chronic (long-term) pain in the neck or back.  Medial branch nerves are the nerves that carry many of the pain messages from your facet joints.  Radiofrequency medial branch neurotomy is a type of medial branch neurotomy that is used to relieve arthritis pain. It uses radio waves to damage nerves in your neck or back so that they can no longer send pain messages to your brain.  Before your doctor knows if a neurotomy will help you, you will get a medial branch block to find out if certain nerves are the ones that are a source of your pain. You will need two separate visits to the outpatient center or hospital to have both procedures.  You will need someone to drive you home.  How is a medial branch block done?  The doctor will use a tiny needle to numb the skin where you will get the block. Then the doctor puts the block needle into the numbed area. You may feel some pressure, but you should not feel pain. Using fluoroscopy (live X-ray) to guide the needle, the doctor injects medicine onto one or more nerves to make them numb.  If you get relief from your pain in the next 4 to 6 hours, it's a sign that those nerves may be contributing to your pain. The relief will last only a short time. You may then have a medial branch neurotomy at a later visit to try to get longer relief.  How is a medial branch neurotomy done?  The doctor will use a tiny needle to numb the skin where you will get the neurotomy. Then the doctor puts the neurotomy needle into the numbed area. You may feel some pressure. Using fluoroscopy (live X-ray) to guide the needle, the doctor sends radio waves through the needle to the nerve for 60 to 90 seconds. The radio waves heat the nerve, which damages it. The doctor may do this several times. And more than one  nerve may be treated.  How long do medial branch block and neurotomy take?  It takes 20 to 30 minutes to get the block. You can go home after the doctor watches you for about an hour.  It takes 45 to 90 minutes to get a neurotomy, depending on how many nerves are heated. You will probably go home 30 to 60 minutes after the procedure.  What can you expect after a neurotomy?  You will get instructions on how to report how much pain you have when you are at home.  You may feel a little sore or tender at the injection site at first. But after a successful neurotomy, most people have pain relief right away. It often lasts for several months, but your pain may come back.  If your pain does come back, it may mean that the damaged nerve has healed and can send pain messages again. Or it can mean that a different nerve is causing pain. Your doctor will discuss your options with you.  Follow-up care is a key part of your treatment and safety. Be sure to make and go to all appointments, and call your doctor if you are having problems. It's also a good idea to know your test results and keep a list of the medicines you take.  Current as of: December 22, 2020               Content Version: 13.5   2006-2022 [employer].   Care instructions adapted under license by [facility]. If you have questions about a medical condition or this instruction, always ask your healthcare professional. [employer] disclaims any warranty or liability for your use of this information.

## 2023-08-08 NOTE — Progress Notes (Signed)
Christy [name] presents today for   Chief Complaint   Patient presents with    Lower Back Pain       Is someone accompanying this pt? no    Is the patient using any DME equipment during OV? Yes, cane       Coordination of Care:  1. Have you been to the ER, urgent care clinic since your last visit? Yes, pt went to the ER for abdominal pain.  Hospitalized since your last visit? no    2. Have you seen or consulted any other health care providers outside of the [facility] since your last visit? Yes, pcp Include any pap smears or colon screening. no

## 2023-08-15 ENCOUNTER — Encounter
Admit: 2023-08-15 | Discharge: 2023-08-15 | Payer: PRIVATE HEALTH INSURANCE | Attending: General Practice | Primary: General Practice

## 2023-08-15 DIAGNOSIS — R7989 Other specified abnormal findings of blood chemistry: Secondary | ICD-10-CM

## 2023-08-15 MED ORDER — NITROGLYCERIN 0.4 MG SL SUBL
0.4 | ORAL_TABLET | SUBLINGUAL | 1 refills | Status: AC | PRN
Start: 2023-08-15 — End: ?

## 2023-08-15 MED ORDER — AMLODIPINE BESYLATE 10 MG PO TABS
10 MG | ORAL_TABLET | Freq: Every day | ORAL | 0 refills | Status: DC
Start: 2023-08-15 — End: 2024-01-22

## 2023-08-15 MED ORDER — CYANOCOBALAMIN 1000 MCG/ML IJ SOLN
1000 | Freq: Once | INTRAMUSCULAR | Status: AC
Start: 2023-08-15 — End: 2023-08-15
  Administered 2023-08-15: 15:00:00 1000 ug via INTRAMUSCULAR

## 2023-08-15 NOTE — Progress Notes (Signed)
[facility]      MR#: [id number]    HISTORY OF PRESENT ILLNESS  History of Present Illness  The patient is a 47 year old female who presents for a 4-week follow-up.    She consulted with an orthopedic spine specialist last week due to severe back pain that left her bedridden. She received a B12 injection today, which has improved her pain, tingling, and energy levels. She plans to schedule another appointment next week.    Her blood pressure has been elevated despite taking her prescribed medications, including olmesartan, carvedilol, and clonidine. She has not been able to see her cardiologist, Dr. Claiborne Billings, and has been experiencing headaches and chest pressure. She was previously on amlodipine but was switched to olmesartan by Dr. Claiborne Billings, who also suggested discontinuing clonidine. She believes the carvedilol is effective and wonders if her high blood pressure could be due to her pain. She has made dietary changes, avoiding added salt and opting for frozen vegetables. She has not had her blood pressure checked recently due to a malfunctioning machine.    She experiences chest pain and uses nitroglycerin for relief, but she is running low on this medication. She has been having frequent dizzy spells, particularly when standing up or taking a step, and experienced severe chest pain and near fainting last night.    She has lost weight, dropping from 201 pounds at her last visit to 196 pounds currently, without any specific efforts to do so. She takes her thyroid medication at 3:00 AM and then goes back to sleep.    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,  [employer]      Health maintenance:  Colon cancer screening: Due in 2032  Breast cancer screening: Due, ordered  Cervical cancer screening: No longer has cervix   Lung cancer screening: At 50  COVID: Pfizer x 3  Influenza: UTD  PCV: PPSV23 in 2020  Shingles: At 50      Current Outpatient Medications:     amLODIPine (NORVASC) 10 MG tablet, Take 1 tablet by mouth daily, Disp: 90 tablet, Rfl: 0    nitroGLYCERIN (NITROSTAT) 0.4 MG SL tablet, Place 1 tablet under the tongue every 5 minutes as needed for Chest pain, Disp: 25 tablet, Rfl: 1    traZODone (DESYREL) 150 MG tablet, Take 1 tablet by mouth nightly, Disp: , Rfl:     vitamin D (ERGOCALCIFEROL) 1.25 MG (50000 UT) CAPS capsule, Take 1 capsule by mouth once a week, Disp: 12 capsule, Rfl: 1    glucose monitoring kit, 1 kit by Does not apply route daily, Disp: 1 kit, Rfl: 0    cloNIDine (CATAPRES) 0.3 MG tablet, Take 1 tablet by mouth 2 times daily, Disp: 180 tablet, Rfl: 3    carvedilol (COREG) 25 MG tablet, TAKE 1 TABLET BY MOUTH TWICE A DAY, Disp: 60 tablet, Rfl: 11    venlafaxine (EFFEXOR XR) 75 MG extended release capsule, Take 1 capsule by mouth daily, Disp: 90 capsule, Rfl: 3    olmesartan (BENICAR) 40 MG tablet, Take 1 tablet by mouth daily, Disp: 90  tablet, Rfl: 3    hydrOXYzine pamoate (VISTARIL) 50 MG capsule, Take 1 capsule by mouth 3 times daily as needed for Anxiety, Disp: 90 capsule, Rfl: 2    metFORMIN (GLUCOPHAGE) 1000 MG tablet, TAKE 1 TABLET 2 TIMES DAILY (WITH MEALS) FOR 90 DAYS. INDICATIONS: TYPE 2 DIABETES MELLITUS, Disp: 180 tablet, Rfl: 3    atorvastatin (LIPITOR) 40 MG tablet, TAKE 1 TABLET BY MOUTH NIGHTLY FOR 90 DAYS. INDICATIONS: HIGH CHOLESTEROL, Disp: 90 tablet, Rfl: 3    Dulaglutide 4.5 MG/0.5ML SOPN, Inject 4.5 mg into the skin once a week Indications: Diabetes, Disp: 12 Adjustable Dose Pre-filled Pen Syringe, Rfl: 3    dapagliflozin (FARXIGA) 10 MG tablet, Take 1 tablet by mouth daily, Disp: 90 tablet, Rfl: 3    levothyroxine  (SYNTHROID) 200 MCG tablet, Take 1 tablet by mouth every morning (before breakfast), Disp: 90 tablet, Rfl: 1    Lancets MISC, , Disp: , Rfl:     aspirin 81 MG EC tablet, Take 1 tablet by mouth daily, Disp: , Rfl:     linaclotide (LINZESS) 290 MCG CAPS capsule, Take 1 capsule by mouth every morning (before breakfast), Disp: , Rfl:     pantoprazole (PROTONIX) 40 MG tablet, Take 1 tablet by mouth 2 times daily, Disp: , Rfl:     Review of Systems   Constitutional:  Negative for appetite change, chills, fatigue, fever and unexpected weight change.   HENT:  Negative for congestion, rhinorrhea and sore throat.    Eyes:  Negative for pain.   Respiratory:  Negative for cough, chest tightness and shortness of breath.    Cardiovascular:  Negative for chest pain, palpitations and leg swelling.   Gastrointestinal:  Negative for abdominal pain, constipation, diarrhea, nausea and vomiting.   Genitourinary:  Negative for dysuria and frequency.   Musculoskeletal:  Positive for back pain. Negative for arthralgias and myalgias.   Skin:  Negative for rash and wound.   Neurological:  Negative for dizziness and weakness.     BP (!) 162/120 (Site: Left Upper Arm, Position: Sitting, Cuff Size: Large Adult)   Pulse (!) 121   Temp 98.2 F (36.8 C) (Temporal)   Resp 14   Ht 1.524 m (5')   Wt 89.2 kg (196 lb 9.6 oz)   SpO2 95%   Breastfeeding No   BMI 38.40 kg/m     Physical Exam  Constitutional:       General: She is not in acute distress.     Appearance: Normal appearance. She is not ill-appearing or toxic-appearing.   HENT:      Head: Normocephalic and atraumatic.      Mouth/Throat:      Pharynx: No posterior oropharyngeal erythema.   Cardiovascular:      Rate and Rhythm: Normal rate and regular rhythm.      Pulses: Normal pulses.      Heart sounds: Normal heart sounds. No murmur heard.     No friction rub. No gallop.   Pulmonary:      Effort: Pulmonary effort is normal. No respiratory distress.      Breath sounds: Normal breath  sounds. No wheezing, rhonchi or rales.   Abdominal:      General: Abdomen is flat. Bowel sounds are normal.      Palpations: Abdomen is soft.      Tenderness: There is no abdominal tenderness. There is no guarding.   Musculoskeletal:         General: Normal range of motion.  Cervical back: No tenderness.   Skin:     General: Skin is warm.      Capillary Refill: Capillary refill takes less than 2 seconds.      Findings: No erythema or rash.   Neurological:      General: No focal deficit present.      Mental Status: She is alert and oriented to person, place, and time. Mental status is at baseline.      Motor: No weakness.      Gait: Gait normal.   Psychiatric:         Mood and Affect: Mood normal.         Behavior: Behavior normal.         Thought Content: Thought content normal.         Judgment: Judgment normal.        Results       ASSESSMENT and PLAN    Christy [name] was seen today for pain.    Diagnoses and all orders for this visit:    Low vitamin B12 level  -     cyanocobalamin injection 1,000 mcg    Chronic bilateral low back pain without sciatica    Primary hypertension  -     amLODIPine (NORVASC) 10 MG tablet; Take 1 tablet by mouth daily    Coronary artery disease involving native coronary artery of native heart without angina pectoris  -     nitroGLYCERIN (NITROSTAT) 0.4 MG SL tablet; Place 1 tablet under the tongue every 5 minutes as needed for Chest pain       Assessment & Plan  1. Hypertension.  Her blood pressure readings have consistently been high for over a month, raising concerns. She reports taking olmesartan, carvedilol, and clonidine, but her blood pressure remains elevated. She experiences chest pain and uses nitroglycerin, which has been refilled. Amlodipine will be added to her regimen, to be taken once daily alongside her other medications. She is advised to monitor her diet for hidden salt in processed foods. If her blood pressure does not improve, further adjustments to her medication may be  necessary.    2. Chronic back pain.  She has been evaluated by an orthopedist at [facility] and is scheduled for a medial branch block on September 11, 2023. If the initial block is effective, a second block will be performed two weeks later, followed by a radiofrequency ablation if successful. She is advised to proceed with the planned interventions.    3. Vitamin B12 deficiency.  She has received one B12 injection and reports significant pain that prevented her from attending a previous appointment. Another B12 injection was administered today. She is advised to continue with weekly B12 injections for a total of four doses, after which her levels will be rechecked.    Follow-up  Return in 2 weeks for follow-up. She will also have a nurse's visit in 1 week for her B12 injection.    Plan of care has been discussed, patient agrees with plan; all questions answered.   More than 50% of the time spent in this visit was counseling the patient about  illness and treatment options       Mena Pauls. Christophe Louis DO  Family Medicine  [facility]       PLEASE NOTE:   This document has been produced using voice recognition software. Unrecognized errors in transcription may be present.

## 2023-08-15 NOTE — Progress Notes (Signed)
Christy [name] is a 47 y.o. female (DOB: Oct 20, 1976) presenting to address:    Chief Complaint   Patient presents with    Pain     Vitamin B12 injection         Vitals:    08/15/23 1032   BP: (!) 160/120   Pulse: (!) 121   Resp: 14   Temp: 98.2 F (36.8 C)   SpO2: 95%       "Have you been to the ER, urgent care clinic since your last visit?  Hospitalized since your last visit?"    NO    "Have you seen or consulted any other health care providers outside of [facility] since your last visit?"    YES - When: approximately 1  weeks ago.  Where and Why: [facility].       Have you had a mammogram?"   NO    Date of last Mammogram: 09/30/2020      "Have you had a pap smear?"    NO    No cervical cancer screening on file     After obtaining consent, and per orders of Dr. Trey Sailors, injection of Vitamin B12 was given in Left deltoid by Peterson Ao, LPN. Patient instructed to remain in clinic for 20 minutes afterwards, and to report any adverse reaction to me immediately.

## 2023-08-22 ENCOUNTER — Encounter
Admit: 2023-08-22 | Discharge: 2023-08-22 | Payer: PRIVATE HEALTH INSURANCE | Attending: General Practice | Primary: General Practice

## 2023-08-22 DIAGNOSIS — R7989 Other specified abnormal findings of blood chemistry: Secondary | ICD-10-CM

## 2023-08-22 MED ORDER — CYANOCOBALAMIN 1000 MCG/ML IJ SOLN
1000 | Freq: Once | INTRAMUSCULAR | Status: AC
Start: 2023-08-22 — End: 2023-08-22
  Administered 2023-08-22: 15:00:00 1000 ug via INTRAMUSCULAR

## 2023-08-22 NOTE — Progress Notes (Signed)
After obtaining consent, and per orders of Dr. Trey Sailors, injection of Vitamin B12 was given in Left deltoid by Peterson Ao, LPN. Patient instructed to remain in clinic for 20 minutes afterwards, and to report any adverse reaction to me immediately.

## 2023-08-28 NOTE — ED Provider Notes (Signed)
[facility] EMERGENCY DEPARTMENT    Time of Arrival:   08/28/23 1201        Final diagnoses:   [G43.801] Other migraine with status migrainosus, not intractable (Primary)   [R55] Syncope and collapse       Medical Decision Making:      Differential/Questionable Diagnosis:   Syncope - considered Vasovagal, arrhythmia, orthostatic hypotension, anemia, dehydration, metabolic causes, electrolyte imbalance, hypovolemia; migraine                                             Supplemental Historians Include; : patient     ED Course: This is a 47 year old female who presents after syncopal episode yesterday.  Patient states she intermittently feels dizzy yesterday passed out no injuries.  She is also complaining of migraine which is diffuse all meds are not working.  EKG here is reassuring.  Troponin is negative.  Labs are otherwise unremarkable.  A CT scan of her chest shows no PE.  Patient was treated symptomatically for migraine with some improvement.  I offered the patient admission for further workup of her syncopal episode yesterday however she declines and will follow-up with her doctors actually has an appointment tomorrow at 10:30 in the morning.  Advised to return with any new or worsening symptoms.    I have discussed all of my concerns with the patient as well as reviewed test results and answered any questions to the best of my ability and I believe, the satisfaction of the patient who agrees with disposition and discharge plan and assures me will return to the ED with ANY progression in symptoms as we discussed at length. The patient fully understands that alternative diagnoses may exist and assures me will return for reassessment here or with PCP as we discussed.                    Documentation/Prior Results Review : Nursing notes    Rhythm interpretations from Monitor by me; : normal sinus rhythm    Image interpretations by me -  : CT no acute process    CT CTA CHEST PULMONARY   Final Result   No CT  evidence of pulmonary embolus.      Stable tiny nodule right lung, no follow-up recommended per Fleischner criteria.      Signed By: Silvestre Mesi, MD on 08/28/2023 2:57 PM         EKG 12 LEAD UNIT PERFORMED             .     Social drivers : social factors reviewed, did not limit treatment     Discussion of Management with other Physicians, QHP or Appropriate Source; : None    .    Disposition:  Home    Discharge Medication List as of 08/28/2023  3:21 PM        START taking these medications    Details   promethazine (PHENERGAN) 12.5 mg PO TABS Take 1 Tab by Mouth Every 6 Hours As Needed for Nausea (headache)., Disp-20 Tab, R-0, Normal           Chief Complaint   Patient presents with   . SYNCOPE       This is a 47 year old female who presents after syncopal episode yesterday.  Patient states she intermittently feels dizzy yesterday passed out no injuries.  She is also complaining of migraine which is diffuse all meds are not working. Patient denied CP/SOB currently.       History provided by:  Patient  SYNCOPE   Associated symptoms include headaches. Pertinent negatives include chest pain, fever and nausea.               Review of Systems:  Constitutional:  Negative for fever.   Endocrine: Negative for hyperglycemia.   Respiratory:  Negative for shortness of breath.    Cardiovascular:  Negative for chest pain.   Gastrointestinal:  Negative for nausea.   Skin:  Negative for rash.   Neurological:  Positive for syncope and headaches.   Hematological:  does not bruise/bleed easily.   Psychiatric/Behavioral:  The patient is not nervous/anxious.        Physical Exam  Vitals and nursing note reviewed.   Constitutional:       General: She is not in acute distress.     Appearance: She is not diaphoretic.   HENT:      Head: Normocephalic and atraumatic.   Eyes:      Pupils: Pupils are equal, round, and reactive to light.   Cardiovascular:      Rate and Rhythm: Normal rate and regular rhythm.   Pulmonary:      Effort:  Pulmonary effort is normal. No respiratory distress.      Breath sounds: Normal breath sounds.   Abdominal:      Tenderness: There is no abdominal tenderness.   Musculoskeletal:         General: Normal range of motion.      Cervical back: Normal range of motion.   Skin:     General: Skin is warm and dry.      Findings: No rash.   Neurological:      General: No focal deficit present.      Mental Status: She is alert and oriented to person, place, and time. Mental status is at baseline.         Past Medical History:   Diagnosis Date   . Cardiac disorder    . Cystitis, interstitial    . Diabetes mellitus (HCC)    . Diabetic neuropathy (HCC)    . Hypertension      Past Surgical History:   Procedure Laterality Date   . CHOLECYSTECTOMY     . HYSTERECTOMY     . TUBAL LIGATION       Family History   Problem Relation Age of Onset   . Heart Disease Father    . Other Family History Father    . Diabetes Father    . Cancer Father    . Heart Disease Mother    . Other Family History Mother    . Diabetes Mother    . Thyroid Disease Mother      Social History     Occupational History   . Not on file   Tobacco Use   . Smoking status: Every Day     Current packs/day: 0.25     Average packs/day: 0.3 packs/day for 12.0 years (3.0 ttl pk-yrs)     Types: Cigarettes   . Smokeless tobacco: Never   . Tobacco comments:     tobacco cessation counselling done   Vaping Use   . Vaping status: Never Used   Substance and Sexual Activity   . Alcohol use: Yes     Comment: occasionally   . Drug use: No  Comment: denies   . Sexual activity: Not on file     Outpatient Medications Marked as Taking for the 08/28/23 encounter [facility] Encounter)   Medication Sig Dispense Refill   . promethazine (PHENERGAN) 12.5 mg PO TABS Take 1 Tab by Mouth Every 6 Hours As Needed for Nausea (headache). 20 Tab 0     Allergies   Allergen Reactions   . Lisinopril cough   . Vicodin [Hydrocodone-Acetaminophen] rash/itching   . Penicillins unknown     Yeast infection          Vital Signs:  Patient Vitals for the past 72 hrs:   Temp Pulse Resp BP BP Mean SpO2   08/28/23 1525 -- 110 -- -- -- --   08/28/23 1208 98.1 F (36.7 C) 118 18 130/83 (!) 101 MM HG 100 %       Diagnostics:  Labs:    Results for orders placed or performed during the hospital encounter of 08/28/23   BASIC METABOLIC PANEL   Result Value Ref Range    Potassium 3.6 3.5 - 5.5 mmol/L    Sodium 137 133 - 145 mmol/L    Chloride 101 98 - 110 mmol/L    Glucose 178 (H) 70 - 99 mg/dL    Calcium 9.3 8.4 - 46.9 mg/dL    BUN 13 6 - 22 mg/dL    Creatinine 0.7 0.5 - 1.2 mg/dL    CO2 20 20 - 32 mmol/L    eGFR >60.0 >60.0 mL/min/1.73 sq.m.    Anion Gap 16.0 (H) 3.0 - 15.0 mmol/L   Troponin Care Path   Result Value Ref Range    Troponin (T) Quant High Sensitivity (5th Gen) <6 0 - 19 ng/L   CBC WITH DIFFERENTIAL AUTO   Result Value Ref Range    WBC 7.7 4.0 - 11.0 K/uL    RBC 4.51 3.80 - 5.20 M/uL    HGB 13.0 11.7 - 16.0 g/dL    HCT 62.9 52.8 - 41.3 %    MCV 87 80 - 99 fL    MCH 29 26 - 34 pg    MCHC 33 31 - 36 g/dL    RDW 24.4 01.0 - 27.2 %    Platelet 217 140 - 440 K/uL    MPV 12.6 9.0 - 13.0 fL   MANUAL DIFFERENTIAL   Result Value Ref Range    Segmented Neutrophils (Manual) 68 40 - 75 %    Lymphocytes (Manual) 28 20 - 45 %    Monocytes (Manual) 3 3 - 12 %    Eosinophils (Manual) 1 0 - 6 %    Absolute Neutrophils (Manual) 5.2 1.8 - 7.7 K/uL    Absolute Lymphocytes (Manual) 2.2 1.0 - 4.8 K/uL    Absolute Monocytes (Manual) 0.2 0.1 - 1.0 K/uL    Absolute Eosinophils (Manual) 0.1 0.0 - 0.5 K/uL    Normochromic RBC Normochromic     Normocytic RBC Normocytic     Total Cells Counted 100     Smear Evaluation Platelet count was verified by smear review.      ECG:  Results for orders placed or performed during the hospital encounter of 08/28/23   EKG 12 LEAD UNIT PERFORMED   Result Value Ref Range Status    Heart Rate 102 bpm Preliminary    RR Interval 588 ms Preliminary    Atrial Rate 103 ms Preliminary    P-R Interval 166 ms Preliminary     P Duration 117 ms Preliminary  P Horizontal Axis 2 deg Preliminary    P Front Axis 42 deg Preliminary    Q Onset 502 ms Preliminary    QRSD Interval 92 ms Preliminary    QT Interval 355 ms Preliminary    QTcB 463 ms Preliminary    QTcF 424 ms Preliminary    QRS Horizontal Axis -41 deg Preliminary    QRS Axis 28 deg Preliminary    I-40 Front Axis 21 deg Preliminary    t-40 Horizontal Axis -55 deg Preliminary    T-40 Front Axis 32 deg Preliminary    T Horizontal Axis 74 deg Preliminary    T Wave Axis 54 deg Preliminary    S-T Horizontal Axis 74 deg Preliminary    S-T Front Axis 59 deg Preliminary    Impression - OTHERWISE NORMAL ECG -  Preliminary    Impression ST-Sinus tachycardia-rate>99  Preliminary            Medications ordered/given in the ED  Medications   prochlorperazine Edisylate (Compazine) injection 10 mg (10 mg Intravenous Given 08/28/23 1300)   ketorolac (ToradoL) injection 15 mg (15 mg Intravenous Given 08/28/23 1300)   diphenhydrAMINE (BenadryL) injection 25 mg (25 mg Intravenous Given 08/28/23 1300)   iohexol (OmniPaque) 350 mg iodine/mL solution 100 mL (100 mL Intravenous Given 08/28/23 1428)

## 2023-08-29 ENCOUNTER — Encounter
Admit: 2023-08-29 | Discharge: 2023-08-29 | Payer: PRIVATE HEALTH INSURANCE | Attending: General Practice | Primary: General Practice

## 2023-08-29 DIAGNOSIS — I10 Essential (primary) hypertension: Secondary | ICD-10-CM

## 2023-08-29 MED ORDER — CYANOCOBALAMIN 1000 MCG/ML IJ SOLN
1000 | Freq: Once | INTRAMUSCULAR | Status: DC
Start: 2023-08-29 — End: 2023-08-29

## 2023-08-29 NOTE — Progress Notes (Signed)
Christy [name] is a 47 y.o. female (DOB: 28-Jan-1976) presenting to address:    Chief Complaint   Patient presents with    Dizziness     C/o dizziness w/SOB; went to ED; passed out on Monday, fell, hurt left shoulder/left hip/back       Vitals:    08/29/23 1030   BP: 130/80   Pulse: (!) 122   Resp: 14   Temp: 98 F (36.7 C)   SpO2: 99%       "Have you been to the ER, urgent care clinic since your last visit?  Hospitalized since your last visit?"    YES - When: approximately 1 days ago.  Where and Why: [facility] ED for dizziness.    "Have you seen or consulted any other health care providers outside of [facility] since your last visit?"    NO       Have you had a mammogram?"   NO    Date of last Mammogram: 09/30/2020      "Have you had a pap smear?"    NO    No cervical cancer screening on file

## 2023-08-29 NOTE — Progress Notes (Signed)
[facility]      MR#: [id number]    HISTORY OF PRESENT ILLNESS  History of Present Illness  The patient is a 47 year old female who presents for ER follow-up for headaches and dizziness.    She is currently experiencing dizziness and prefers to remain seated. She reports feeling unwell and fatigued. She has been taking Coreg, but it does not seem to be effective. She describes a sensation of her heart racing, which she can feel even during sleep, causing her to wake up.    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, cleans   [employer]      Health maintenance:  Colon cancer screening: Due in 2032  Breast cancer screening: Due, ordered  Cervical cancer screening: No longer has cervix   Lung cancer screening: At 50  COVID: Pfizer x 3  Influenza: UTD  PCV: PPSV23 in 2020  Shingles: At 50      Current Outpatient Medications:     amLODIPine (NORVASC) 10 MG tablet, Take 1 tablet by mouth daily, Disp: 90 tablet, Rfl: 0    nitroGLYCERIN (NITROSTAT) 0.4 MG SL tablet, Place 1 tablet under the tongue every 5 minutes as needed for Chest pain, Disp: 25 tablet, Rfl: 1    traZODone (DESYREL) 150 MG tablet, Take 1 tablet by mouth nightly, Disp: , Rfl:     vitamin D (ERGOCALCIFEROL) 1.25 MG (50000 UT) CAPS capsule, Take 1 capsule by mouth once a week, Disp: 12 capsule, Rfl: 1    glucose monitoring kit, 1 kit by Does not apply route daily, Disp: 1 kit, Rfl: 0    cloNIDine (CATAPRES) 0.3 MG tablet, Take 1 tablet by mouth 2 times daily, Disp: 180 tablet, Rfl: 3    carvedilol (COREG) 25 MG tablet, TAKE 1 TABLET BY MOUTH TWICE A DAY, Disp: 60 tablet, Rfl: 11    venlafaxine (EFFEXOR  XR) 75 MG extended release capsule, Take 1 capsule by mouth daily, Disp: 90 capsule, Rfl: 3    olmesartan (BENICAR) 40 MG tablet, Take 1 tablet by mouth daily, Disp: 90 tablet, Rfl: 3    hydrOXYzine pamoate (VISTARIL) 50 MG capsule, Take 1 capsule by mouth 3 times daily as needed for Anxiety, Disp: 90 capsule, Rfl: 2    metFORMIN (GLUCOPHAGE) 1000 MG tablet, TAKE 1 TABLET 2 TIMES DAILY (WITH MEALS) FOR 90 DAYS. INDICATIONS: TYPE 2 DIABETES MELLITUS, Disp: 180 tablet, Rfl: 3    atorvastatin (LIPITOR) 40 MG tablet, TAKE 1 TABLET BY MOUTH NIGHTLY FOR 90 DAYS. INDICATIONS: HIGH CHOLESTEROL, Disp: 90 tablet, Rfl: 3    Dulaglutide 4.5 MG/0.5ML SOPN, Inject 4.5 mg into the skin once a week Indications: Diabetes, Disp: 12 Adjustable Dose Pre-filled Pen Syringe, Rfl: 3    dapagliflozin (FARXIGA) 10 MG tablet, Take 1 tablet by mouth daily, Disp: 90 tablet, Rfl: 3    levothyroxine (SYNTHROID) 200 MCG tablet, Take 1 tablet by mouth every morning (before breakfast), Disp: 90 tablet, Rfl: 1    Lancets MISC, , Disp: , Rfl:     aspirin 81 MG EC tablet, Take 1 tablet by mouth daily, Disp: ,  Rfl:     linaclotide (LINZESS) 290 MCG CAPS capsule, Take 1 capsule by mouth every morning (before breakfast), Disp: , Rfl:     pantoprazole (PROTONIX) 40 MG tablet, Take 1 tablet by mouth 2 times daily, Disp: , Rfl:     Review of Systems   Constitutional:  Positive for fatigue. Negative for appetite change, chills, fever and unexpected weight change.   HENT:  Negative for congestion, rhinorrhea and sore throat.    Eyes:  Negative for pain.   Respiratory:  Positive for cough and shortness of breath. Negative for chest tightness.    Cardiovascular:  Negative for chest pain, palpitations and leg swelling.   Gastrointestinal:  Negative for abdominal pain, constipation, diarrhea, nausea and vomiting.   Genitourinary:  Negative for dysuria and frequency.   Musculoskeletal:  Negative for arthralgias, back pain and myalgias.   Skin:  Negative for rash  and wound.   Neurological:  Positive for dizziness. Negative for seizures, syncope, facial asymmetry, speech difficulty and weakness.     BP 130/80 (Site: Left Upper Arm, Position: Sitting, Cuff Size: Medium Adult)   Pulse (!) 122   Temp 98 F (36.7 C) (Temporal)   Resp 14   Ht 1.524 m (5')   Wt 89 kg (196 lb 3.2 oz)   SpO2 99%   Breastfeeding No   BMI 38.32 kg/m     Physical Exam  Constitutional:       General: She is not in acute distress.     Appearance: Normal appearance. She is not ill-appearing or toxic-appearing.   HENT:      Head: Normocephalic and atraumatic.      Right Ear: Tympanic membrane, ear canal and external ear normal.      Left Ear: Tympanic membrane, ear canal and external ear normal.      Nose: Nose normal. No rhinorrhea.      Mouth/Throat:      Mouth: Mucous membranes are moist.      Pharynx: Oropharynx is clear. No posterior oropharyngeal erythema.   Eyes:      Extraocular Movements: Extraocular movements intact.      Conjunctiva/sclera: Conjunctivae normal.      Pupils: Pupils are equal, round, and reactive to light.   Cardiovascular:      Rate and Rhythm: Regular rhythm. Tachycardia present.      Pulses: Normal pulses.      Heart sounds: Normal heart sounds. No murmur heard.     No friction rub. No gallop.   Pulmonary:      Effort: Pulmonary effort is normal. No respiratory distress.      Breath sounds: Normal breath sounds. No wheezing, rhonchi or rales.   Abdominal:      General: Abdomen is flat. Bowel sounds are normal.      Palpations: Abdomen is soft.      Tenderness: There is no abdominal tenderness. There is no guarding.   Musculoskeletal:         General: Normal range of motion.      Cervical back: Normal range of motion and neck supple. No tenderness.   Lymphadenopathy:      Cervical: No cervical adenopathy.   Skin:     General: Skin is warm.      Capillary Refill: Capillary refill takes less than 2 seconds.      Findings: No erythema or rash.   Neurological:      General:  No focal deficit present.      Mental  Status: She is alert and oriented to person, place, and time. Mental status is at baseline.      Motor: No weakness.      Gait: Gait normal.   Psychiatric:         Mood and Affect: Mood normal.         Behavior: Behavior normal.         Thought Content: Thought content normal.         Judgment: Judgment normal.        Results       ASSESSMENT and PLAN    Christy [name] was seen today for dizziness.    Diagnoses and all orders for this visit:    Primary hypertension    Atrial flutter, unspecified type (HCC)    Type 2 diabetes mellitus without complication, without long-term current use of insulin (HCC)    Coronary artery disease involving native coronary artery of native heart without angina pectoris    Hypothyroidism, unspecified type    Other orders  -     Discontinue: cyanocobalamin injection 1,000 mcg  -     EKG 12 lead; Future  -     EKG 12 lead       Assessment & Plan  1. Atrial flutter  Her symptoms of dizziness and fatigue are likely due to atrial fibrillation or flutter, which is causing an elevated heart rate. Despite being on Coreg, her condition is not adequately controlled.  I spoke with cardiology PA in the hospital and it was suggested that she requires hospitalization for further workup and medication adjustment.  Previous echocardiogram shows slightly decreased ejection fraction.  May need her thyroid medicine adjusted as well.  Likely needs anticoagulation.  She was advised against driving and an ambulance was offered for transportation to the hospital. However, she declined the ambulance and opted to contact her son for assistance. Her paperwork was prepared, detailing her current medications and the actions taken today.      Plan of care has been discussed, patient agrees with plan; all questions answered.   More than 50% of the time spent in this visit was counseling the patient about  illness and treatment options     Follow up as previously scheduled      Mena Pauls.  Christophe Louis DO  Family Medicine  [facility]       PLEASE NOTE:   This document has been produced using voice recognition software. Unrecognized errors in transcription may be present.

## 2023-08-29 NOTE — H&P (Signed)
[facility] PROVIDER      Initial Evaluation Note    Patient is appropriate for and is admitted to the Short Stay Unit for treatment of chest pain not fully responsive to ED care and with high risk features.  Vital signs, clinical history, medications, allergies, family history and social history reviewed and/or discussed with referring provider.  Patient will benefit from a period of Observational care for further.  Reassessment of therapy will be focused on clinical course.  Based on the response to interventions in the [facility], a determination of safety for home and outpatient follow up versus need for inpatient care will be made.  Orders have been written and full History and Physical to follow.      Admission History and Physical      IMPRESSION/PLAN       #Chest pain  08/28/2023 CTA chest with no acute findings  EKG without ischemia as per my read; sinus tachycardia noted  Chest x-ray with no acute findings as per my read  CBC reviewed; without leukocytosis, stable H&H  BMP reviewed; CO2 19, otherwise unremarkable  Troponin 6 > 6  BNP 69  Plan: Telemetry, echocardiogram, NST on Thursday, trend troponin, likely discharge with event monitor    # Syncope  Syncopal episode yesterday; seen in the ED unremarkable workup and nonfocal findings on exam  Plan: Bilateral carotid PVL    # Diabetes Mellitus, Type 2  - glucose: 80 (08/29/23)  - hemoglobin A1c: 6.1 % (12/27/11) down from 6.2 % (10/03/11)  - continue home: dapagliflozin 10 mg po daily  - holding home: metformin 1000 mg po  - start: insulin lispro 1 - 6 u sc QID (due 08/29/23)   plan: SSI     # Coronary artery disease  - continue home: atorvastatin 40 mg po daily  - continue home: carvedilol 25 mg po BID  - holding home: aspirin 81 mg po     # Hypertension  - 24hr BP range: (161-096) / (04-54) (08/29/23)  - continue home: carvedilol 25 mg po BID  - continue home: clonidine 0.3 mg po BID  - continue home: olmesartan 40 mg po daily      # Hyperlipidemia: with clinical ASCVD  - continue home: atorvastatin 40 mg po daily    Code status: Full    Resume OP medications for chronic stable medical conditions  Further management based on clinical course and test results.    PROPHYLAXIS:  DVT: Subcu heparin and SCDs    Admit to [facility] for further evaluation and management of the above.        Discussed with Dr. Roselee Nova.  Updates regarding management, prognosis, treatment and complications of the above medical conditions discussed in detail.  All questions answered to the satisfaction of those individual(s) who also verbalized understanding of and agreement with the assessment and plan.    Patient Status:  observation    Estimated date of discharge:  1-2 days  Disposition plan:  TBD    HPI     Chief Complaint   Patient presents with   . CHEST PAIN (ADULT)       HPI  Christy [name] is a 47 y.o. female with PMHx as noted below who is being admitted for chest pain onset today at 10:45 AM.  Patient denies any palpitations, shortness of breath, diaphoresis, nausea.  Patient was seen here yesterday and had a negative CTA chest/abdomen/pelvis and negative head CT status post a syncopal episode.  Patient denies fevers,  chills, numbness, weakness.        PAST HISTORY     Past Medical History:   Diagnosis Date   . Cardiac disorder    . Cystitis, interstitial    . Diabetes mellitus (HCC)    . Diabetic neuropathy (HCC)    . Hypertension        Past Surgical History:   Procedure Laterality Date   . CHOLECYSTECTOMY     . HYSTERECTOMY     . TUBAL LIGATION         Social History     Socioeconomic History   . Marital status: Divorced     Spouse name: Not on file   . Number of children: Not on file   . Years of education: Not on file   . Highest education level: Not on file   Occupational History   . Not on file   Tobacco Use   . Smoking status: Every Day     Current packs/day: 0.25     Average packs/day: 0.3 packs/day for 12.0 years (3.0 ttl pk-yrs)     Types: Cigarettes   .  Smokeless tobacco: Never   . Tobacco comments:     tobacco cessation counselling done   Vaping Use   . Vaping status: Never Used   Substance and Sexual Activity   . Alcohol use: Yes     Comment: occasionally   . Drug use: No     Comment: denies   . Sexual activity: Not on file   Other Topics Concern   . Back Care Not Asked   . Bike Helmet Not Asked   . Blood Transfusions Not Asked   . Caffeine Concern Not Asked   . Counseling Not Asked   . Depression Concerns Not Asked   . Depression Screening Not Asked   . Exercise Not Asked   . Hobby Hazards Not Asked   . Military Service Not Asked   . Occupational Exposure Not Asked   . Seat Belt Not Asked   . Self-Exams Not Asked   . Sleep Concern Not Asked   . Smoke Detectors Not Asked   . Smoking Concerns Not Asked   . Smoking Cessation Not Asked   . Special Diet Not Asked   . Stress Concern Not Asked   . Weight Concern Not Asked   Social History Narrative   . Not on file     Social Drivers of Health     Financial Resource Strain: Low Risk  (03/12/2023)    Received from [facility]    Overall Physicist, medical Strain (CARDIA)    . Difficulty of Paying Living Expenses: Not hard at all   Food Insecurity: No Food Insecurity (03/12/2023)    Received from [facility]    Hunger Vital Sign    . Worried About Programme researcher, broadcasting/film/video in the Last Year: Never true    . Ran Out of Food in the Last Year: Never true   Transportation Needs: Unknown (03/12/2023)    Received from [facility]    PRAPARE - Transportation    . Lack of Transportation (Medical): Not on file    . Lack of Transportation (Non-Medical): No   Physical Activity: Not on file   Stress: Not on file   Social Connections: Not on file   Intimate Partner Violence: Not on file   Housing Stability: Unknown (02/17/2022)    Received from [facility]    Housing Stability Vital Sign    . Unable to Pay for Housing in the  Last Year: Not on file    . Number of Places Lived in the Last Year: Not on file    . Unstable Housing in the Last Year: No       Family History   Problem Relation Age of Onset   . Heart Disease Father    . Other Family History Father    . Diabetes Father    . Cancer Father    . Heart Disease Mother    . Other Family History Mother    . Diabetes Mother    . Thyroid Disease Mother          HOME MEDICATIONS     Outpatient Medications Marked as Taking for the 08/29/23 encounter [facility] Encounter)   Medication Sig Dispense Refill   . Carvedilol 25 mg PO TABS Take 1 Tab by Mouth Twice Daily.     Marland Kitchen venlafaxine ER (EFFEXOR XR) 75 mg PO CP24 Take 1 Cap by Mouth at bedtime.         ALLERGIES     Allergies   Allergen Reactions   . Lisinopril cough   . Vicodin [Hydrocodone-Acetaminophen] rash/itching   . Penicillins unknown     Yeast infection               PHYSICAL ASSESSMENT     BP 121/63   Temp 98 F (36.7 C)   Resp 20   Wt 96.2 kg (212 lb)   SpO2 97%   BMI 40.06 kg/m     No intake or output data in the 24 hours ending 08/29/23 1702    EXAM:  CONSTITUTIONAL:  Well developed, nourished, in no acute distress, awake, alert and oriented to person, place and time.  HEENT:  Atraumatic, nose normal, normocephalic, oropharynx clear and moist, conjunctiva normal, anicteric sclera.    NECK:  ROM normal, trachea normal  PULMONARY/CHEST WALL:  Respiratory effort normal. No wheezing  CARDIOVASCULAR:  normal rate, regular rhythm. No edema  ABDOMINAL:  soft, non-tender and non-distended. No peritoneal signs.  MUSCULOSKELETAL:  Normal ROM and development  SKIN:  Skin is warm and dry.  NEUROLOGICAL:  CN II-XII are grossly intact.  Non-focal.    PSYCH:  Mood and affect within normal limits.  LABS       CBC w/Diff    Recent Labs     08/29/23  1343 08/28/23  1258   WBC 7.2 7.7   RBC 4.47 4.51   HEMOGLOBIN 13.1 13.0   HCT 38.2 39.4   MCV 86 87   MCH 29 29   MCHC 34 33   RDW 12.6 12.7   PLATELET 251 217   MPV 12.4 12.6    Recent Labs      08/29/23  1343 08/28/23  1258   SEGS 54 68   LYMPHOCYTES 41 28   MONOS 4 3   EOS 1 1   BASOS 0  --    RDW 12.6 12.7          Basic Metabolic Profile   Recent Labs     08/29/23  1437 08/28/23  1258   NA 136 137   POTASSIUM 3.7 3.6   CHLORIDE 104 101   CO2 19* 20   BUN 11 13   CREAT 0.6 0.7   GLUCOSE 80 178*   CALCIUM 9.1 9.3  STUDIES     EKG 08/29/2023   Independently reviewed and interpreted.  Results for orders placed or performed during the hospital encounter of 08/29/23   EKG 12 LEAD UNIT PERFORMED   Result Value Ref Range Status    Heart Rate 112 bpm Final    RR Interval 532 ms Final    Atrial Rate 112 ms Final    P-R Interval 170 ms Final    P Duration 124 ms Final    P Horizontal Axis -17 deg Final    P Front Axis 52 deg Final    Q Onset 500 ms Final    QRSD Interval 90 ms Final    QT Interval 353 ms Final    QTcB 484 ms Final    QTcF 435 ms Final    QRS Horizontal Axis -40 deg Final    QRS Axis 30 deg Final    I-40 Front Axis 51 deg Final    t-40 Horizontal Axis -70 deg Final    T-40 Front Axis 9 deg Final    T Horizontal Axis 51 deg Final    T Wave Axis 77 deg Final    S-T Horizontal Axis 65 deg Final    S-T Front Axis 78 deg Final    Impression - OTHERWISE NORMAL ECG -  Final    Impression ST-Sinus tachycardia-rate>99  Final       CXR 08/29/2023    Independently reviewed and interpreted.    Recent Results (from the past 36 hours)   1. EKG 12 LEAD UNIT PERFORMED   Result Value Ref Range    Heart Rate 112 bpm    RR Interval 532 ms    Atrial Rate 112 ms    P-R Interval 170 ms    P Duration 124 ms    P Horizontal Axis -17 deg    P Front Axis 52 deg    Q Onset 500 ms    QRSD Interval 90 ms    QT Interval 353 ms    QTcB 484 ms    QTcF 435 ms    QRS Horizontal Axis -40 deg    QRS Axis 30 deg    I-40 Front Axis 51 deg    t-40 Horizontal Axis -70 deg    T-40 Front Axis 9 deg    T Horizontal Axis 51 deg    T Wave Axis 77 deg    S-T Horizontal Axis 65 deg    S-T Front Axis 78 deg    Impression - OTHERWISE  NORMAL ECG -     Impression ST-Sinus tachycardia-rate>99    2. CHEST PA AND LATERAL    Narrative    EXAM: CHEST PA AND LATERAL    CLINICAL HISTORY/INDICATION: CHEST PAIN    COMPARISON: Chest x-ray dated 06/11/2023    TECHNIQUE: 2 views of the chest were obtained.     FINDINGS:   HEART/MEDIASTINUM: Unremarkable    LUNGS: Clear    BONES/ SOFT TISSUES: Unremarkable           Impression    No acute cardiopulmonary disease.    Signed By: Lucrezia Starch, MD on 08/29/2023 1:38 PM     3. CT CTA CHEST PULMONARY    Narrative    EXAM: CT CTA CHEST PULMONARY    CLINICAL INDICATION/HISTORY: Pulmonary embolism (PE) suspected, high prob    COMPARISON: 05/28/2021    TECHNIQUE: CTA of the chest was performed using timing optimized for pulmonary embolism technique, with IV contrast.  To maximize sensitivity the sagittal and coronal reconstructions were created using a 3D multislice MIP (maximal intensity projection) methodology.  CT scans at this facility are performed using dose optimization technique as appropriate to the performed exam, to include automated exposure control, adjustment of the mA and/or kV according to patient size (including appropriate matching for site specific examinations), or use of iterative reconstruction technique.      FINDINGS:  Technical: Adequate opacification pulmonary arteries.    Pulmonary arteries: No filling defects are appreciated within the main, lobar or visualized segmental pulmonary arteries to suggest embolism.    Aorta and other cardiovascular structures: No aortic aneurysm or dissection. Cardiac motion degrades evaluation of the coronary arteries however no significant coronary calcification identified.  Heart Strain assessment:  -  RV/LV ratio (normal <0.9): Normal  -  Dysfunction or bowing of interventricular septum: None  -  Main pulmonary artery enlargement (>27mm): Not present  -  There is not visualization of contrast reflux from the right heart into the IVC/hepatic  veins.    Lung/Airway:  3 mm nodule right lower lung (7:61) unchanged since 2020. Previously demonstrated 2 mm nodule right upper lung is not appreciated.    Pleura:  No pleural effusion.    Lymph nodes:  No lymphadenopathy.    Chest wall and lower neck: Negative.    Upper abdominal structures: Cholecystectomy.    Bones: Negative.        Impression    No CT evidence of pulmonary embolus.    Stable tiny nodule right lung, no follow-up recommended per Fleischner criteria.    Signed By: Silvestre Mesi, MD on 08/28/2023 2:57 PM     4. EKG 12 LEAD UNIT PERFORMED   Result Value Ref Range    Heart Rate 102 bpm    RR Interval 588 ms    Atrial Rate 103 ms    P-R Interval 166 ms    P Duration 117 ms    P Horizontal Axis 2 deg    P Front Axis 42 deg    Q Onset 502 ms    QRSD Interval 92 ms    QT Interval 355 ms    QTcB 463 ms    QTcF 424 ms    QRS Horizontal Axis -41 deg    QRS Axis 28 deg    I-40 Front Axis 21 deg    t-40 Horizontal Axis -55 deg    T-40 Front Axis 32 deg    T Horizontal Axis 74 deg    T Wave Axis 54 deg    S-T Horizontal Axis 74 deg    S-T Front Axis 59 deg    Impression - OTHERWISE NORMAL ECG -     Impression ST-Sinus tachycardia-rate>99     Impression NSC-No significant change since prior tracing-        Scherrie Bateman, PA    I spent  67  minutes on patient including direct patient care, discussion with family, review of labs and imaging, discussion with consultants and plan for further treatment or diagnostics

## 2023-08-29 NOTE — Progress Notes (Signed)
[facility] PROVIDER          08/29/2023, 10:20 PM       PROGRESS NOTE  [facility]  - Admission Date: 08/29/2023       Active Hospital Problems    Diagnosis    . Syncope and collapse [R55]    . Chest pain [R07.9]           Observation Reassessment Note     Impression:    (R55) Syncope and collapse    (R07.9) Chest pain, unspecified type    (R51.9) Acute intractable headache, unspecified headache type    Subjective:    Has a HA.  Received fioricet and flexeril in ED at 1800, but still has HA.    INTERVENTION: ordered toradol, compazine, benadryl    Barriers to discharge:    NST, echo on Thursday, Holter monitor at d/c.    Expected Discharge Date:    08/30/23     Dorene Sorrow, MHS, PA-C

## 2023-08-30 NOTE — Progress Notes (Signed)
Patient has orders for NST.     Per [facility] patient received Fioricet at 0519.        Per stress testing guidelines, caffeine is a contraindication for stress testing if consumed within the last 12 hours.     Nuclear Stress Test will be done on 11/1 if patient remains inpatient and is stable for stress testing.     Patient can eat and drink.  Amy RN and Dr. Cleda Clarks notified via secure chat.      Please make patient NPO after midnight on 10/31 in preparation of stress test on 11/1.     [place] Wilson, BSN RN   Cardiac Non-Invasive Lab  [facility]   [phone]

## 2023-09-07 ENCOUNTER — Ambulatory Visit
Admit: 2023-09-07 | Discharge: 2023-09-07 | Payer: PRIVATE HEALTH INSURANCE | Attending: Cardiovascular Disease | Primary: General Practice

## 2023-09-07 VITALS — BP 155/106 | HR 108 | Ht 60.0 in | Wt 196.0 lb

## 2023-09-07 DIAGNOSIS — R55 Syncope and collapse: Secondary | ICD-10-CM

## 2023-09-07 NOTE — Patient Instructions (Addendum)
Testing    HR Holter for 30 days    Order placed for [employer]. Patient will receive a call from the company.

## 2023-09-07 NOTE — Progress Notes (Unsigned)
Identified pt with two pt identifiers(name and DOB). Reviewed record in preparation for visit and have obtained necessary documentation.    Christy [name] presents today for   Chief Complaint   Patient presents with    Follow-up      dizziness; afib         Pt c/o DIZZINESS,  CHEST PAIN/ PRESSURE, FATIGUE/WEAKNESS, SWELLING.             Christy [name] preferred language for health care discussion is english/other.    Personal Protective Equipment:   Engineer, maintenance was used including: mask-surgical and hands-gloves. Patient was placed on no precaution(s). Patient was not masked.    Precautions:   Patient currently on None  Patient currently roomed with door closed.    Is someone accompanying this pt? no    Is the patient using any DME equipment during OV? yes    Depression Screening:      02/27/2023     2:21 PM 01/05/2023     2:49 PM 12/12/2022     2:26 PM 05/03/2022     2:46 PM 03/23/2022     2:12 PM 03/22/2022     2:37 PM 02/17/2022    11:37 AM   PHQ-9 Questionaire   Little interest or pleasure in doing things 1 0 3 0 0 0 0   Feeling down, depressed, or hopeless 1 0 3 0 0 0 0   Trouble falling or staying asleep, or sleeping too much  0 3       Feeling tired or having little energy  0 3       Poor appetite or overeating  0 3       Feeling bad about yourself - or that you are a failure or have let yourself or your family down  0 3       Trouble concentrating on things, such as reading the newspaper or watching television  0 2       Moving or speaking so slowly that other people could have noticed. Or the opposite - being so fidgety or restless that you have been moving around a lot more than usual  0 0       Thoughts that you would be better off dead, or of hurting yourself in some way  0 3       PHQ-9 Total Score 2 0 23 0 0 0 0   If you checked off any problems, how difficult have these problems made it for you to do your work, take care of things at home, or get along with other people?  0 3             Learning Assessment:  No question data found.    Abuse Screening:      09/07/2023     8:00 AM 03/23/2022     2:00 PM 02/06/2022     1:00 PM 12/30/2021    11:00 AM   AMB Abuse Screening   Do you ever feel afraid of your partner? N N N N   Are you in a relationship with someone who physically or mentally threatens you? N N N N   Is it safe for you to go home? Nolon Rod          Fall Risk      09/07/2023     8:40 AM 04/19/2023     8:42 AM 02/27/2023     2:21 PM 03/23/2022  2:12 PM 02/06/2022     1:08 PM 12/30/2021    11:44 AM   Fall Risk   Do you feel unsteady or are you worried about falling?  no no no no no no   2 or more falls in past year? no no no no no no   Fall with injury in past year? no no no no no no         Pt currently taking Anticoagulant /Antiplatelet therapy? aspirin    Coordination of Care:  1. Have you been to the ER, urgent care clinic since your last visit? Hospitalized since your last visit? yes  2. Have you seen or consulted any other health care providers outside of the [facility] since your last visit? Include any pap smears or colon screening. no      Please see Red banners under Allergies and Med Rec to remove outside inquires. All correct information has been verified with patient and added to chart.     Medication's patient's would liked removed has been marked not taking to be removed per Verbal order and read back per Merdis Delay, MD

## 2023-09-10 NOTE — Progress Notes (Signed)
Subjective:      Christy [name] is in the office today for cardiac reevaluation. She is a 47 year old woman that has been told she had enlarged heart in the past.  Prior CT CTA of the chest demonstrated a normal-sized heart.      At her initial appointment several years ago,, the patient reported a several year history of chest pain. It was described as "stabbing "in quality. It was also described as sometimes dull. It could last up to 5 minutes. It generally went away without intervention. She would took an occasional aspirin. She localized the discomfort to the left peristernal and inframammary areas. The patient also experienced shortness of breath. She said that one flight of steps would cause her to be very short of breath. She had no PND or orthopnea.    At a prior appointment, metoprolol was added to her regimen.  She felt that it was making her lower extremities swell.  At some point, carvedilol was added to her regimen.  She was given clonidine to take twice daily which she has only been taking nightly.  She remains on the Benicar.    She had a [facility] hospitalization in the last week or so.  Results of the testing done at that time or as described below.    In the office today she reports she is in a lot of pain.  She is having occasional dizzy spells.  Her blood pressure is quite elevated in the office today.  She reports her numbers are much better at home.  Patient's cardiac risk factors are smoking/ tobacco exposure, family history, dyslipidemia, diabetes mellitus, hypertension.        Patient Active Problem List    Diagnosis Date Noted    History of nuclear stress test 09/18/2020    Palpitations 09/18/2020    Tobacco abuse 09/18/2020    Family history of premature CAD 09/18/2020    Obesity, morbid (HCC) 10/22/2019    Spinal stenosis     IBS (irritable bowel syndrome)     Chest pain 12/10/2012    Dizziness 12/10/2012    CAD (coronary artery disease) 06/06/2011    Hyperlipidemia with target low density  lipoprotein (LDL) cholesterol less than 70 mg/dL 16/07/9603    Hypertension 06/06/2011    Obesity 06/06/2011    DM (diabetes mellitus) (HCC) 06/06/2011    GERD (gastroesophageal reflux disease) 06/06/2011     Current Outpatient Medications   Medication Sig Dispense Refill    olmesartan (BENICAR) 40 mg tablet TAKE 1 TABLET BY MOUTH EVERY DAY 30 Tablet 5    dapagliflozin (FARXIGA) 10 mg tab tablet Take 1 Tablet by mouth daily. 90 Tablet 1    atorvastatin (Lipitor) 40 mg tablet Take 1 Tablet by mouth nightly for 90 days. Indications: high cholesterol 90 Tablet 1    docusate sodium (COLACE) 100 mg capsule Take 1 Capsule by mouth two (2) times a day for 90 days. 60 Capsule 2    traZODone (DESYREL) 50 mg tablet TAKE 1 TABLET BY MOUTH AT BEDTIME AS NEEDED FOR SLEEP      hydrOXYzine pamoate (VISTARIL) 50 mg capsule TAKE 1 CAPSULE BY MOUTH 4 TIMES A DAY AS NEEDED FOR ANXIETY      glucose blood VI test strips (True Metrix Glucose Test Strip) strip Use to test blood sugar once daily 100 Strip 11    lancets misc Use to test blood sugar once daily 1 Each 11    Blood-Glucose Meter (True Metrix Glucose Meter) misc  1 Kit by Does Not Apply route daily. 1 Each 0    Insulin Needles, Disposable, 31 gauge x 5/16" ndle Use twice daily for insulin and victoza injections. 100 Each 11    SITagliptin (Januvia) 100 mg tablet Take 1 Tablet by mouth daily. Indications: type 2 diabetes mellitus 90 Tablet 2    Latuda 40 mg tab tablet Take 1 Tablet by mouth nightly.      PARoxetine (PAXIL) 30 mg tablet Take 30 mg by mouth nightly.      liraglutide (Victoza 3-Pak) 0.6 mg/0.1 mL (18 mg/3 mL) pnij ADMINISTER 1.8MG  SUBCUTANEOUSLY ONCE A DAY  Indications: type 2 diabetes mellitus 9 Adjustable Dose Pre-filled Pen Syringe 0    methocarbamoL (ROBAXIN) 750 mg tablet Take 1 Tablet by mouth four (4) times daily as needed for Muscle Spasm(s) or Pain. 50 Tablet 0    levothyroxine (SYNTHROID) 200 mcg tablet Take 1 Tablet by mouth Daily (before breakfast). 30  Tablet 2    buPROPion (WELLBUTRIN) 75 mg tablet Take 1 Tablet by mouth two (2) times a day. 60 Tablet 5    cloNIDine HCL (CATAPRES) 0.3 mg tablet Take 1 Tablet by mouth two (2) times a day. 180 Tablet 1    insulin glargine (Lantus Solostar U-100 Insulin) 100 unit/mL (3 mL) inpn Inject 10 units subcutaneously every night at bedtime. 45 mL 3    aspirin delayed-release 81 mg tablet 81 mg.      nitroglycerin (NITROSTAT) 0.4 mg SL tablet 1 Tablet by SubLINGual route every five (5) minutes as needed for Chest Pain. Up to 3 doses. 25 Tablet 3    metFORMIN (GLUCOPHAGE) 1,000 mg tablet Take 1 Tablet by mouth two (2) times daily (with meals) for 90 days. 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    Enlarged heart     Hypertension     IBS (irritable bowel syndrome)     Interstitial cystitis     Sciatica     Spinal stenosis      Past Surgical History:   Procedure Laterality Date    HX CHOLECYSTECTOMY      2008    HX ORTHOPAEDIC      LEFT KNEE    HX ORTHOPAEDIC      LEFT ARM    HX PARTIAL HYSTERECTOMY  2003    STILL HAVE OVARIES    HX PELVIC LAPAROSCOPY      HX THYROIDECTOMY       Family History   Problem Relation Age of Onset    Diabetes Mother     Heart Disease Mother     Diabetes Father     Cancer Sister     Heart Disease Maternal Aunt     Cancer Paternal Aunt      Social History     Tobacco Use   Smoking Status Every Day    Packs/day: 0.25    Types: Cigarettes   Smokeless Tobacco  Never          Review of Systems, additional:  Constitutional: negative  Eyes: negative  Respiratory: positive for dyspnea on exertion  Cardiovascular: positive  for chest pain, palpitations, dyspnea on exertion  Gastrointestinal: negative  Musculoskeletal:negative  Neurological: negative  Behvioral/Psych: negative  Endocrine: negative  ENT: negative    Objective:     Visit Vitals  BP (!) 155/106 (Site: Left Upper Arm, Position: Sitting, Cuff Size: Large Adult)   Pulse (!) 108   Ht 1.524 m (5')   Wt 88.9 kg (196 lb)   SpO2 100%   BMI 38.28 kg/m      General:  alert, cooperative, no distress   Chest Wall: inspection normal - no chest wall deformities or tenderness, respiratory effort normal   Lung: clear to auscultation bilaterally   Heart:  normal rate and regular rhythm, S1 and S2 normal, no murmurs noted, no gallops noted, no JVD   Abdomen: soft, non-tender. Bowel sounds normal. No masses,  no organomegaly   Extremities: extremities normal, atraumatic, no cyanosis or edema Skin: no rashes   Neuro: alert, oriented, normal speech, no focal findings or movement disorder noted     EKG: 08/29/2023.  Sinus tachycardia.  Otherwise normal ECG.    Assessment/Plan:       ICD-10-CM ICD-9-CM    1. Primary hypertension, severely elevated BP in the office today.  Started metoprolol 100 mg twice daily at prior visit.  She stopped the metoprolol because of  leg swelling.  The swelling improved.  She was prescribed clonidine 0.3 mg twice daily and takes 1 at night.  .  Increased the  atenolol to 75 mg daily at prior visit.  She is now off the atenolol and on carvedilol 25 mg twice daily.  She is also on Benicar 40 mg daily.  Additionally she is taking amlodipine 10 mg daily.  She reports she is" in a lot of pain ".  In the office today. I10 401.9 ECHO ADULT COMPLETE      TSH AND FREE T4   2. Syncope, patient had a syncopal spell a week prior to this office visit, as well as chest pain.  Carotid studies were ordered and completed which were essentially normal.  She had an echocardiogram that demonstrated normal systolic function.  The right heart appeared to be visually enlarged.  A  nuclear stress test was done which was normal.  EF was 59%.  A CT CTA of the chest did not demonstrate evidence for pulmonary embolism.  She was discharged for follow-up.  Will order 30-day event recorder and have patient return in 5 weeks   ECHO ADULT COMPLETE      TSH AND FREE T4      CARDIAC HOLTER MONITOR      AMB POC EKG ROUTINE W/ 12 LEADS, INTER & REP   3. Screening for thyroid disorder  Z13.29 V77.0 TSH AND FREE T4   4. Hyperlipidemia with target low density lipoprotein (LDL) cholesterol less than 70 mg/dL, 1/61/0960.. Total cholesterol 272 HDL 56.  Triglycerides 160.  LDL 184 patient on Lipitor 20 mg daily. E78.5 272.4    5. Diabetes mellitus due to underlying condition with hyperosmolarity without coma, unspecified whether long term insulin use (HCC) patient taking dulaglutide and semaglutide E08.00 249.20    6. Dizziness,?  Vertigo      7. Chest pain, unspecified type , having occasional palpitations. R07.9 786.50    8. Palpitations , beta-blocker switched to Coreg R00.2  785.1    9. Tobacco abuse  Z72.0 305.1    10     Severe obesity, BMI 38.28

## 2023-09-11 ENCOUNTER — Inpatient Hospital Stay: Payer: PRIVATE HEALTH INSURANCE | Attending: Physical Medicine & Rehabilitation | Primary: General Practice

## 2023-09-11 LAB — POCT GLUCOSE: POC Glucose: 304 mg/dL — ABNORMAL HIGH (ref 70–110)

## 2023-09-11 LAB — POC PREGNANCY UR-QUAL: Preg Test, Ur: NEGATIVE

## 2023-09-11 MED ORDER — ROPIVACAINE HCL 2 MG/ML IJ SOLN
Freq: Once | INTRAMUSCULAR | Status: AC
Start: 2023-09-11 — End: 2023-09-11
  Administered 2023-09-11: 15:00:00 4 mL

## 2023-09-11 MED ORDER — ROPIVACAINE HCL 2 MG/ML IJ SOLN
INTRAMUSCULAR | Status: AC
Start: 2023-09-11 — End: ?

## 2023-09-11 MED FILL — ROPIVACAINE HCL 2 MG/ML IJ SOLN: INTRAMUSCULAR | Qty: 10

## 2023-09-11 NOTE — Procedures (Signed)
 PROCEDURE NOTE      DIAGNOTIC LUMBAR MEDIAL BRANCH BLOCKS  PROCEDURE REPORT, TRIAL #1      PATIENT:  Christy [name]  DATE OF BIRTH:  09/01/1976  DATE OF SERVICE:  09/11/2023  SITE:  [facility]:  Special Procedures [address] [place], [place] [address]

## 2023-09-11 NOTE — Other (Signed)
 Discharge instructions given. Patient verbalized understanding. Handout given.   Blood sugar 304, patient teaching provided.  Patient offered to go over the emergency department for eval and treatment, patient declined.  She states she did not take her met

## 2023-09-11 NOTE — H&P (Signed)
 Office Visit  08/08/2023  [facility]       Jeanie Sewer, MD  Physical Medicine and Rehab Facet arthritis, degenerative, lumbar spine +1 more  Dx Lower Back Pain  Reason for Visit     Progress Notes  Jeanie Sewer, MD

## 2023-09-13 NOTE — Progress Notes (Addendum)
 Christy [name]    1976-01-19    Medial Branch Block Bilateral  L4/5,  L5/S1    Trial #1 09/11/23       Pre-Procedure Pain Level: 10   Post-Procedure Pain Level 4   The percent of pain relief 60%    Activities Performed: Activities Performed: Patient

## 2023-10-15 ENCOUNTER — Ambulatory Visit
Admit: 2023-10-15 | Discharge: 2023-10-15 | Payer: PRIVATE HEALTH INSURANCE | Attending: Physical Medicine & Rehabilitation | Admitting: Physical Medicine & Rehabilitation | Primary: General Practice

## 2023-10-15 VITALS — HR 108 | Temp 98.50000°F | Ht 60.0 in | Wt 196.0 lb

## 2023-10-15 DIAGNOSIS — M47816 Spondylosis without myelopathy or radiculopathy, lumbar region: Principal | ICD-10-CM

## 2023-10-15 MED ORDER — BACLOFEN 10 MG PO TABS
10 | ORAL_TABLET | Freq: Three times a day (TID) | ORAL | 1 refills | Status: DC | PRN
Start: 2023-10-15 — End: 2024-06-21

## 2023-10-15 NOTE — Progress Notes (Signed)
 [facility]    [address]  [place], [place] [address]  Phone: [phone]  Fax: [phone]        Christy [name]  DOB: 05/24/1976  PCP: Benedetto Goad, DO    PROGRESS NOTE      ASSESSMENT AND P

## 2023-10-15 NOTE — Patient Instructions (Addendum)
 [facility]  [email or web address]  [phone]     Shoulder Blade: Exercises  Introduction  Here are some examples of exercises for you to try. The exercises may be suggested for a condition or for rehabilitation. Start each ex

## 2023-10-15 NOTE — Progress Notes (Signed)
 Fredonia Highland presents today for   Chief Complaint   Patient presents with    Lower Back Pain       Is someone accompanying this pt? no    Is the patient using any DME equipment during OV? Yes, cane     Coordination of Care:  1. Have you been to the ER, u

## 2023-10-17 ENCOUNTER — Ambulatory Visit: Payer: PRIVATE HEALTH INSURANCE | Attending: Cardiovascular Disease | Primary: General Practice

## 2023-10-30 ENCOUNTER — Encounter: Admit: 2023-10-30

## 2023-10-30 DIAGNOSIS — R55 Syncope and collapse: Secondary | ICD-10-CM

## 2023-11-21 ENCOUNTER — Encounter: Payer: PRIVATE HEALTH INSURANCE | Attending: Cardiovascular Disease | Primary: General Practice

## 2023-11-30 ENCOUNTER — Ambulatory Visit: Payer: PRIVATE HEALTH INSURANCE | Attending: Physical Medicine & Rehabilitation | Primary: General Practice

## 2023-12-21 ENCOUNTER — Ambulatory Visit: Payer: PRIVATE HEALTH INSURANCE | Attending: Physical Medicine & Rehabilitation | Primary: General Practice

## 2024-01-22 ENCOUNTER — Encounter: Admit: 2024-01-22 | Discharge: 2024-01-22 | Payer: PRIVATE HEALTH INSURANCE | Primary: General Practice

## 2024-01-22 ENCOUNTER — Inpatient Hospital Stay: Admit: 2024-01-22 | Payer: PRIVATE HEALTH INSURANCE | Primary: General Practice

## 2024-01-22 ENCOUNTER — Ambulatory Visit
Admit: 2024-01-22 | Discharge: 2024-01-22 | Payer: PRIVATE HEALTH INSURANCE | Attending: General Practice | Primary: General Practice

## 2024-01-22 VITALS — BP 140/80 | HR 104 | Temp 97.20000°F | Resp 13 | Ht 60.0 in | Wt 189.6 lb

## 2024-01-22 DIAGNOSIS — E119 Type 2 diabetes mellitus without complications: Secondary | ICD-10-CM

## 2024-01-22 DIAGNOSIS — E039 Hypothyroidism, unspecified: Secondary | ICD-10-CM

## 2024-01-22 LAB — AMB POC HEMOGLOBIN A1C: Hemoglobin A1C, POC: 7.3 %

## 2024-01-22 MED ORDER — AMLODIPINE BESYLATE 10 MG PO TABS
10 | ORAL_TABLET | Freq: Every day | ORAL | 3 refills | Status: DC
Start: 2024-01-22 — End: 2024-06-21

## 2024-01-22 MED ORDER — DAPAGLIFLOZIN PROPANEDIOL 10 MG PO TABS
10 | ORAL_TABLET | Freq: Every day | ORAL | 3 refills | Status: AC
Start: 2024-01-22 — End: ?

## 2024-01-22 MED ORDER — CLONIDINE HCL 0.3 MG PO TABS
0.3 | ORAL_TABLET | Freq: Two times a day (BID) | ORAL | 3 refills | Status: AC
Start: 2024-01-22 — End: ?

## 2024-01-22 MED ORDER — METFORMIN HCL 1000 MG PO TABS
1000 | ORAL_TABLET | Freq: Two times a day (BID) | ORAL | 3 refills | Status: DC
Start: 2024-01-22 — End: 2024-10-15

## 2024-01-22 MED ORDER — CARVEDILOL 25 MG PO TABS
25 | ORAL_TABLET | Freq: Two times a day (BID) | ORAL | 3 refills | 30.00000 days | Status: DC
Start: 2024-01-22 — End: 2024-02-29

## 2024-01-22 MED ORDER — SEMAGLUTIDE(0.25 OR 0.5MG/DOS) 2 MG/3ML SC SOPN
23 | SUBCUTANEOUS | 3 refills | Status: AC
Start: 2024-01-22 — End: ?

## 2024-01-22 MED ORDER — OLMESARTAN MEDOXOMIL 40 MG PO TABS
40 | ORAL_TABLET | Freq: Every day | ORAL | 3 refills | Status: DC
Start: 2024-01-22 — End: 2024-06-03

## 2024-01-22 MED ORDER — ATORVASTATIN CALCIUM 40 MG PO TABS
40 | ORAL_TABLET | Freq: Every day | ORAL | 3 refills | 90.00000 days | Status: DC
Start: 2024-01-22 — End: 2024-10-15

## 2024-01-22 NOTE — Patient Instructions (Signed)
 [address] Area [employer]*  (Call United Way/211 if need more resources.)    Thomas Hoff of [place] and the [employer]  What they offer: Assistance with finding a food pantry, soup kitchen or resource near you.  Website: http://www.garza-graham.com/  Provide instructions for assistance with [employer] Engineer, maintenance (IT)) application on this site.     SNAP (formerly Sales executive)  What they offer: SNAP is used like cash to buy eligible food items from authorized retailers.  Apply for benefits online: StreamingLessons.se  Apply for benefits by telephone: [phone]  Apply for benefits at local Department of Social Services     Meals on Wheels   What they offer: Meals on Wheels is a program that delivers meals to individuals age 48+ at home who are unable to purchase or prepare their own meals.   Website: https://feedmore.org/how-we-help/meals-on-wheels/  Requirements can vary by program and areas served.   To apply:  Designer, industrial/product of [place]: [phone] -Fri 8:30 a.m. to 4:30 p.m.)  Coverage Area:  [place], [place], [place], [place], [address], [place], [place], [place] of [place] & [employer]  Website: http://floyd.org/  [employer] Agency On Aging: [phone] - Fri 8:00am to 5:30pm)  Coverage Areas: [place], [place], [place], [place], [place].    Oasis Facilities manager  What they offer:  Oasis provides comprehensive services to the disadvantaged/low-income community, homebound seniors and homeless in [place], [place], and [place].  Phone Number: [phone]  [address] [facility] [place], [place] [address]  Services:  Food pantry (Monday, Wednesday, Friday), [employer], Doctor, general practice, Chief of Staff, Glass blower/designer, aid with completing [employer].   Website:  https://www.oasissocialministry.org/services      Therapist, occupational  What they offer: Deliver a bag filled with food and personal care items to isolated elderly persons and conduct 1:1 visits  Website: MassVoice.co.uk  Visit the website and select "Contact" to send a message  [phone]          [facility] Food Pantry  What they offer: Produce and healthy food options to [place] and surrounding neighborhood in the [place] area.   Contact: [phone]  Helpful Info: Open every Monday 9am-11am.   [facility] [place]  What they offer- Food boxes to families in need  Contact: [phone]  www.mannaministrytidewater.org/requestfood                                                [employer] Area [employer]*  (Call United Way/211 if need more resources.)    Utilities  CommonHelp  What they offer: Partnership with the [employer] of [facility]. Assist with finding and applying for government funded programs and benefits. You can also update your benefits or report changes through CommonHelp.  Website: StreamingLessons.se  Phone Number: 833-5CALLVA [phone])    [employer] Energy[employer]  What they offer: Charlsie Merles is [employer]'s energy assistance program of last resort for  anyone who faces financial hardships from unemployment or family crisis.  Phone Number: 815-178-4913  Address: [address], [place], [place] [address]  Website: https://www.dominionenergy.com//billing/billing-options/energyshare    Energy Assistance Programs (EAP) - Department of Social Services  What they offer: EAP assists low income households in meeting their immediate home energy needs.  Phone Number: (951)317-1773 or contact your local department of social services  Website: http://www.meadows-mitchell.com/  [employer] of [address]  What they offer: IT consultant number:  ([phone].  Website: https://ulher.org    [employer]  What they offer: Utility, rent, mortgage assistance   Phone number: [phone].  Website: http://www.montgomery.info/  Regional Housing Crisis Hotline  Can assist with rent assistance, eviction prevention and utility bills.  Phone Number: 807-077-0455    Medical  [facility] Financial Assistance  What they offer: The [employer] Program helps uninsured patients who do not qualify for government-sponsored health insurance and cannot afford to pay for their medical care. Insured patient may also qualify for assistance based on family income, family size, and medical needs.  Phone Number: 539-638-0676  How to apply for the [employer] Assistance Program:  Option 1: To apply for financial assistance, a patient (or their family or other provider) should fill out the Software engineer. Copies of the Financial Assistance Application and the FAP may be obtained for free by calling the [employer] customer service department at [phone].  Option 2: The Software engineer and policy may be obtained for free by downloading a copy from the [employer]:  https://www.bonsecurs.com/patient-resources/financial-assistance  Patient Print production planner.pafcares.org  What they offer: The [place] Program can assist you if you're uninsured  and have been diagnosed with chronic, debilitating, or life-threatening disease.  Phone Number: (540)685-9600  Website: PainBracelets.dk    Care[facility]  What they offer: Mobile health clinic for uninsured community members  Phone number: [phone]      Medication  Good Rx  What they offer: Good Rx tracks prescription drug prices and provides free drug coupons for discounts on medications.  Website: https://www.goodrx.com/  NeedyMeds  What they offer: NeedyMeds offers free information on medications and healthcare cost savings  programs including prescription assistance programs, coupons, and discount programs.  Website: https://www.needymeds.org/  Helpline: 810-641-2286    RX Assist  What they offer: Information about free and low-cost medicine programs.  Website: https://www.clark.net/  [employer] $4 Prescription Program  What they offer: Prescription Program includes up to a 30-day supply for $4 and a 90- day supply for $10 of some covered generic drugs at commonly prescribed dosages  Website: https://jacobson-moore.net/    The Co-Pay Relief Program  What they offer: The Co-Pay Relief Program provides you with direct prescription co- payment assistance, if you are an insured U.[place] citizen and financially and medically qualify, including Medicare Part D beneficiaries who require assistance with their prescription drug co-payments.  Phone toll-free: 928-007-4691  Website: www.copay.org    The Partnership for Prescription Assistance  What they offer: The Partnership for Prescription Assistance can help you if you lack Prescription coverage to get the medicines you need through the public or private program that is right for you.  Phone toll-free: 562-678-3299  Website: www.pparx.org    [place] Drug [facility]   What they offer: The [place] Drug Card Program gives you and your family access to a free Prescription Drug Card program and savings of up to 75% at more than 50,000 national and [employer].  Phone toll-free: 808-388-4833  Website: www.virginiadrugcard.com    Financial  Coalition Against Poverty (CAPS)  What they offer: Provide support to [facility] residents who are experiencing crises by assessing their needs, providing information and resources, and directly contributing financially  Website: DCHeadlines.cz  [place] residents can make an appointment by calling CAPS at [phone] or emailing [email or web address] .Building services engineer  What they offer: Rent,  utility, food/meal and housing assistance.  Website: BonusTerm.be  Visit the website to find your Horticulturist, commercial

## 2024-01-22 NOTE — Progress Notes (Signed)
 Christy [name] is a 48 y.o. female (DOB: [birth year]) presenting to address:    Chief Complaint   Patient presents with    Diabetes       Vitals:    01/22/24 1353   BP: (!) 140/80   Pulse: (!) 104   Resp: 13   Temp: 97.2 F (36.2 C)   SpO2: 99%       "Have you been to the ER, urgent care clinic since your last visit?  Hospitalized since your last visit?"    NO    "Have you seen or consulted any other health care providers outside of [facility] since your last visit?"    NO       Have you had a mammogram?"   NO    Date of last Mammogram: 09/30/2020      "Have you had a pap smear?"    NO      No cervical cancer screening on file

## 2024-01-22 NOTE — Progress Notes (Signed)
 [facility]      MR#: [id number]    HISTORY OF PRESENT ILLNESS  History of Present Illness  The patient is a 48 year old female who presents for a follow-up of type 2 diabetes, hypertension, hypothyroidism, and vitamin D deficiency.    Elevated blood glucose levels have been reported, with home readings consistently around 400. This is attributed to the discontinuation of Trulicity due to insurance coverage issues. She is considering resuming Ozempic, which was previously tried but not covered by her insurance. The current antidiabetic regimen includes Trulicity, Farxiga, and metformin. The most recent A1c is 7.3.    Improvement in mobility over the past 2 weeks is noted, attributed to recent efforts in physical exercise and walking. However, the condition fluctuates, with good days followed by potentially challenging ones. A cane is used for ambulation due to persistent pain.    Adherence to the prescribed regimen of clonidine twice daily, amlodipine, Benicar, and carvedilol is reported, although she has run out of amlodipine. A recent dietary indiscretion involving the consumption of a BLT sandwich is believed to have contributed to slightly elevated blood pressure. Despite this, significant dietary changes have been made, resulting in weight loss.    Supplementation with vitamin B12, taking 2 tablets daily, is reported.    A mammogram has not yet been undergone but is planned before the next visit.    Sharp head pains every other day, accompanied by a sound when turning the head, are reported. These episodes occasionally disrupt sleep. Daily dizziness and vertigo occur regardless of position (lying down, standing, or walking). No hearing loss or head injuries are reported, but occasional ear drainage and tinnitus are experienced. These symptoms have been present for less than 4 to 6 months, with a noted worsening trend.    Mood fluctuations with severe depression and a panic attack last  weekend are reported. Another panic attack was almost experienced last night. The bipolar medication was stopped due to it making her feel strange and worsening her tics. She continues to take Effexor.    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, cleans   [employer]      Health maintenance:  Colon cancer screening: Due in 2032  Breast cancer screening: Due, ordered  Cervical cancer screening: No longer has cervix   Lung cancer screening: At 50  COVID: Pfizer x 3  Influenza: UTD  PCV: PPSV23 in 2020  Shingles: At 50      Current Outpatient Medications:     TRULICITY 4.5 MG/0.5ML SOAJ, INJECT 4.5 MG INTO THE SKIN ONCE A WEEK INDICATIONS: DIABETES, Disp: , Rfl:     carvedilol (COREG) 25 MG tablet, Take 1 tablet by mouth 2 times daily, Disp: 180 tablet, Rfl: 3    cloNIDine (CATAPRES) 0.3 MG tablet, Take 1 tablet by mouth 2 times daily, Disp: 180 tablet, Rfl: 3    olmesartan (BENICAR) 40 MG tablet, Take 1 tablet by mouth daily, Disp: 90 tablet, Rfl: 3    amLODIPine (NORVASC) 10 MG tablet, Take 1 tablet by mouth daily, Disp: 90 tablet, Rfl: 3  atorvastatin (LIPITOR) 40 MG tablet, Take 1 tablet by mouth daily, Disp: 90 tablet, Rfl: 3    metFORMIN (GLUCOPHAGE) 1000 MG tablet, Take 1 tablet by mouth 2 times daily (with meals), Disp: 180 tablet, Rfl: 3    dapagliflozin (FARXIGA) 10 MG tablet, Take 1 tablet by mouth daily, Disp: 90 tablet, Rfl: 3    Semaglutide,0.25 or 0.5MG /DOS, 2 MG/3ML SOPN, Inject 0.25 mg into the skin every 7 days, Disp: 3 mL, Rfl: 3    baclofen (LIORESAL) 10 MG tablet, Take 1 tablet by mouth 3 times daily as needed (pain), Disp: 90 tablet, Rfl: 1     nitroGLYCERIN (NITROSTAT) 0.4 MG SL tablet, Place 1 tablet under the tongue every 5 minutes as needed for Chest pain, Disp: 25 tablet, Rfl: 1    traZODone (DESYREL) 150 MG tablet, Take 1 tablet by mouth nightly, Disp: , Rfl:     vitamin D (ERGOCALCIFEROL) 1.25 MG (50000 UT) CAPS capsule, Take 1 capsule by mouth once a week, Disp: 12 capsule, Rfl: 1    glucose monitoring kit, 1 kit by Does not apply route daily, Disp: 1 kit, Rfl: 0    venlafaxine (EFFEXOR XR) 75 MG extended release capsule, Take 1 capsule by mouth daily, Disp: 90 capsule, Rfl: 3    hydrOXYzine pamoate (VISTARIL) 50 MG capsule, Take 1 capsule by mouth 3 times daily as needed for Anxiety, Disp: 90 capsule, Rfl: 2    levothyroxine (SYNTHROID) 200 MCG tablet, Take 1 tablet by mouth every morning (before breakfast), Disp: 90 tablet, Rfl: 1    Lancets MISC, , Disp: , Rfl:     aspirin 81 MG EC tablet, Take 1 tablet by mouth daily, Disp: , Rfl:     linaclotide (LINZESS) 290 MCG CAPS capsule, Take 1 capsule by mouth every morning (before breakfast), Disp: , Rfl:     pantoprazole (PROTONIX) 40 MG tablet, Take 1 tablet by mouth 2 times daily, Disp: , Rfl:     ibuprofen (ADVIL;MOTRIN) 800 MG tablet, Take 1 tablet by mouth every 8 hours as needed for Pain (Patient not taking: Reported on 01/22/2024), Disp: , Rfl:     Dulaglutide 4.5 MG/0.5ML SOPN, Inject 4.5 mg into the skin once a week Indications: Diabetes (Patient not taking: Reported on 01/22/2024), Disp: 12 Adjustable Dose Pre-filled Pen Syringe, Rfl: 3    Review of Systems   Constitutional:  Negative for appetite change, chills, fatigue, fever and unexpected weight change.   HENT:  Negative for congestion, rhinorrhea and sore throat.    Eyes:  Negative for pain.   Respiratory:  Negative for cough, chest tightness and shortness of breath.    Cardiovascular:  Negative for chest pain, palpitations and leg swelling.   Gastrointestinal:  Negative for abdominal pain, constipation, diarrhea, nausea and vomiting.    Genitourinary:  Negative for dysuria and frequency.   Musculoskeletal:  Negative for arthralgias, back pain and myalgias.   Skin:  Negative for rash and wound.   Neurological:  Positive for dizziness. Negative for tremors, seizures, syncope, facial asymmetry, speech difficulty, weakness, light-headedness and headaches.     BP (!) 140/80 (BP Site: Left Upper Arm, Patient Position: Sitting, BP Cuff Size: Large Adult)   Pulse (!) 104   Temp 97.2 F (36.2 C) (Temporal)   Resp 13   Ht 1.524 m (5')   Wt 86 kg (189 lb 9.6 oz)   SpO2 99%   BMI 37.03 kg/m     Physical Exam  Constitutional:  General: She is not in acute distress.     Appearance: Normal appearance. She is not ill-appearing or toxic-appearing.   HENT:      Head: Normocephalic and atraumatic.      Right Ear: Tympanic membrane, ear canal and external ear normal.      Left Ear: Tympanic membrane, ear canal and external ear normal.      Nose: Nose normal. No rhinorrhea.      Mouth/Throat:      Mouth: Mucous membranes are moist.      Pharynx: Oropharynx is clear. No posterior oropharyngeal erythema.   Eyes:      Extraocular Movements: Extraocular movements intact.      Conjunctiva/sclera: Conjunctivae normal.      Pupils: Pupils are equal, round, and reactive to light.   Cardiovascular:      Rate and Rhythm: Normal rate and regular rhythm.      Pulses: Normal pulses.      Heart sounds: Normal heart sounds. No murmur heard.     No friction rub. No gallop.   Pulmonary:      Effort: Pulmonary effort is normal. No respiratory distress.      Breath sounds: Normal breath sounds. No wheezing, rhonchi or rales.   Abdominal:      General: Abdomen is flat. Bowel sounds are normal.      Palpations: Abdomen is soft.      Tenderness: There is no abdominal tenderness. There is no guarding.   Musculoskeletal:         General: Normal range of motion.      Cervical back: Normal range of motion and neck supple. No tenderness.   Lymphadenopathy:      Cervical: No  cervical adenopathy.   Skin:     General: Skin is warm.      Capillary Refill: Capillary refill takes less than 2 seconds.      Findings: No erythema or rash.   Neurological:      General: No focal deficit present.      Mental Status: She is alert and oriented to person, place, and time. Mental status is at baseline.      Motor: No weakness.      Gait: Gait normal.   Psychiatric:         Mood and Affect: Mood normal.         Behavior: Behavior normal.         Thought Content: Thought content normal.         Judgment: Judgment normal.        Results  Labs   - A1c: 7.3%     ASSESSMENT and PLAN    Ena was seen today for diabetes.    Diagnoses and all orders for this visit:    Type 2 diabetes mellitus without complication, without long-term current use of insulin (HCC)  -     AMB POC HEMOGLOBIN A1C  -     Lipid Panel; Future  -     Basic Metabolic Panel; Future  -     Albumin/Creatinine Ratio, Urine; Future  -     metFORMIN (GLUCOPHAGE) 1000 MG tablet; Take 1 tablet by mouth 2 times daily (with meals)  -     dapagliflozin (FARXIGA) 10 MG tablet; Take 1 tablet by mouth daily  -     Semaglutide,0.25 or 0.5MG /DOS, 2 MG/3ML SOPN; Inject 0.25 mg into the skin every 7 days    Hypothyroidism, unspecified type  -  TSH reflex to FT4; Future    Primary hypertension  -     carvedilol (COREG) 25 MG tablet; Take 1 tablet by mouth 2 times daily  -     cloNIDine (CATAPRES) 0.3 MG tablet; Take 1 tablet by mouth 2 times daily  -     olmesartan (BENICAR) 40 MG tablet; Take 1 tablet by mouth daily  -     amLODIPine (NORVASC) 10 MG tablet; Take 1 tablet by mouth daily    Bipolar 2 disorder, major depressive episode (HCC)    Vitamin D deficiency  -     Vitamin D 25 Hydroxy; Future    Mixed hyperlipidemia  -     atorvastatin (LIPITOR) 40 MG tablet; Take 1 tablet by mouth daily    Pulsatile tinnitus of both ears  -     MRA HEAD W WO CONTARST; Future    Low vitamin B12 level  -     Vitamin B12; Future    Encounter for screening mammogram  for breast cancer  -     MAM DIGITAL SCREEN W OR WO CAD BILATERAL; Future       Assessment & Plan  1. Type 2 Diabetes Mellitus.  - A1c level is currently at 7.3.  - Continues with metformin and Farxiga.  - Has been on Trulicity 4.5mg  with little fluctuation or improvement with A1c, still needs to lose weight. Trial of Ozempic will be initiated; if not approved by insurance, revert to Trulicity.  - Laboratory tests will be conducted today.    2. Hypertension.  - Continues with carvedilol, clonidine, olmesartan, and amlodipine.  - All medications have been refilled.  - Blood pressure will be rechecked before leaving the office today.  - Reviewed previous Cardiology encounter.     3. Hypothyroidism.  - Thyroid levels were slightly low approximately 5 to 6 months ago.  - Recheck of thyroid levels will be conducted today.    4. Vitamin D Deficiency.  - Vitamin D levels will be rechecked today.    5. Vitamin B12 Deficiency.  - Vitamin B12 levels will be rechecked today.    6. Hyperlipidemia.  - Continues with Lipitor.  - Cholesterol levels will be rechecked today.    7. Pulsatile Tinnitus.  - MRA of the brain will be ordered to investigate potential abnormalities.  - Normal ENT exam.     8. Bipolar Depression.  - Mood has been very up and down; depression is very bad.  - Continues to take Effexor but has stopped bipolar medication due to adverse effects.  - Will continue with Effexor.    9. Health Maintenance.  - Advised to undergo a mammogram before the next visit.      Plan of care has been discussed, patient agrees with plan; all questions answered.   More than 50% of the time spent in this visit was counseling the patient about  illness and treatment options     Follow up in 3 months.        Mena Pauls. Christophe Louis DO  Family Medicine  [facility]       PLEASE NOTE:   This document has been produced using voice recognition software. Unrecognized errors in transcription may be present.

## 2024-01-23 ENCOUNTER — Encounter

## 2024-01-23 LAB — ALBUMIN/CREATININE RATIO, URINE
Albumin Urine: 1.05 mg/dL (ref 0–3.0)
Albumin/Creatinine Ratio: 8 mg/g (ref 0–30)
Creatinine, Ur: 137 mg/dL — ABNORMAL HIGH (ref 30–125)

## 2024-01-23 LAB — BASIC METABOLIC PANEL
Anion Gap: 10 mmol/L (ref 3.0–18)
BUN/Creatinine Ratio: 12 (ref 12–20)
BUN: 13 mg/dL (ref 7.0–18)
CO2: 25 mmol/L (ref 21–32)
Calcium: 9.2 mg/dL (ref 8.5–10.1)
Chloride: 105 mmol/L (ref 100–111)
Creatinine: 1.12 mg/dL (ref 0.6–1.3)
Est, Glom Filt Rate: 61 mL/min/{1.73_m2} (ref >60)
Glucose: 205 mg/dL — ABNORMAL HIGH (ref 74–99)
Potassium: 4.2 mmol/L (ref 3.5–5.5)
Sodium: 140 mmol/L (ref 136–145)

## 2024-01-23 LAB — VITAMIN D 25 HYDROXY: Vit D, 25-Hydroxy: 56.3 ng/mL (ref 30–100)

## 2024-01-23 LAB — LIPID PANEL
Chol/HDL Ratio: 3.1 (ref 0–5.0)
Cholesterol, Total: 155 mg/dL (ref <200)
HDL: 50 mg/dL (ref 40–60)
LDL Cholesterol: 81.6 mg/dL (ref 0–100)
Triglycerides: 117 mg/dL (ref <150)
VLDL Cholesterol Calculated: 23.4 mg/dL

## 2024-01-23 LAB — VITAMIN B12: Vitamin B-12: 1351 pg/mL — ABNORMAL HIGH (ref 211–911)

## 2024-01-23 LAB — TSH REFLEX TO FT4: TSH Cascade Panel: 7.37 u[IU]/mL — ABNORMAL HIGH (ref 0.36–3.74)

## 2024-01-24 LAB — THYROID PEROXIDASE ANTIBODY: Thyroid Peroxidase (TPO) Abs: 15 [IU]/mL (ref 0–34)

## 2024-01-24 LAB — T4, FREE: T4 Free: 0.8 ng/dL (ref 0.7–1.5)

## 2024-01-28 MED ORDER — DOXYCYCLINE HYCLATE 100 MG PO TABS
100 | ORAL_TABLET | Freq: Two times a day (BID) | ORAL | 0 refills | Status: AC
Start: 2024-01-28 — End: 2024-02-04

## 2024-01-28 NOTE — Telephone Encounter (Signed)
 Submitted and received PA denial for Ozempic d/t A1C reading.

## 2024-01-30 ENCOUNTER — Encounter

## 2024-01-30 ENCOUNTER — Inpatient Hospital Stay: Payer: PRIVATE HEALTH INSURANCE | Attending: General Practice | Primary: General Practice

## 2024-01-30 DIAGNOSIS — Z1231 Encounter for screening mammogram for malignant neoplasm of breast: Secondary | ICD-10-CM

## 2024-02-12 ENCOUNTER — Inpatient Hospital Stay: Admit: 2024-02-12 | Payer: PRIVATE HEALTH INSURANCE | Primary: General Practice

## 2024-02-12 ENCOUNTER — Encounter

## 2024-02-12 DIAGNOSIS — N644 Mastodynia: Secondary | ICD-10-CM

## 2024-02-20 ENCOUNTER — Ambulatory Visit
Admit: 2024-02-20 | Discharge: 2024-02-20 | Payer: Medicaid (Managed Care) | Attending: General Practice | Primary: General Practice

## 2024-02-20 ENCOUNTER — Inpatient Hospital Stay: Admit: 2024-02-20 | Payer: Medicaid (Managed Care) | Primary: General Practice

## 2024-02-20 ENCOUNTER — Encounter: Admit: 2024-02-20 | Discharge: 2024-02-20 | Payer: Medicaid (Managed Care) | Primary: General Practice

## 2024-02-20 VITALS — BP 130/88 | HR 99 | Temp 98.10000°F | Resp 13 | Ht 60.0 in | Wt 191.6 lb

## 2024-02-20 DIAGNOSIS — E039 Hypothyroidism, unspecified: Secondary | ICD-10-CM

## 2024-02-20 DIAGNOSIS — E119 Type 2 diabetes mellitus without complications: Secondary | ICD-10-CM

## 2024-02-20 LAB — T3, FREE: T3, Free: 2.9 pg/mL (ref 2.18–3.98)

## 2024-02-20 LAB — AMB POC GLUCOSE, QUANTITATIVE, BLOOD: Glucose, POC: 252 mg/dL

## 2024-02-20 LAB — TSH REFLEX TO FT4: TSH Cascade Panel: 0.08 u[IU]/mL — ABNORMAL LOW (ref 0.36–3.74)

## 2024-02-20 LAB — T4, FREE: T4 Free: 1.8 ng/dL — ABNORMAL HIGH (ref 0.7–1.5)

## 2024-02-20 MED ORDER — DULAGLUTIDE 3 MG/0.5ML SC SOAJ
3 | SUBCUTANEOUS | 0 refills | 28.00000 days | Status: AC
Start: 2024-02-20 — End: ?

## 2024-02-20 MED ORDER — DULAGLUTIDE 1.5 MG/0.5ML SC SOAJ
1.5 | SUBCUTANEOUS | 0 refills | 28.00000 days | Status: AC
Start: 2024-02-20 — End: ?

## 2024-02-20 MED ORDER — DULAGLUTIDE 4.5 MG/0.5ML SC SOAJ
4.5 | SUBCUTANEOUS | 0 refills | 28.00000 days | Status: DC
Start: 2024-02-20 — End: 2024-04-09

## 2024-02-20 MED ORDER — DAPAGLIFLOZIN PROPANEDIOL 10 MG PO TABS
10 | ORAL_TABLET | Freq: Every day | ORAL | 3 refills | 90.00000 days | Status: DC
Start: 2024-02-20 — End: 2024-10-15

## 2024-02-20 NOTE — Progress Notes (Signed)
 Christy [name] is a 48 y.o. female (DOB: 1976-06-01) presenting to address:    Chief Complaint   Patient presents with    Blood Sugar Problem     Pt is reporting elevated glucose levels; out of Trulicity        Vitals:    02/20/24 0802   BP: 130/88   Pulse: 99   Resp: 13   Temp: 98.1 F (36.7 C)   SpO2: 99%       "Have you been to the ER, urgent care clinic since your last visit?  Hospitalized since your last visit?"    NO    "Have you seen or consulted any other health care providers outside of [facility] since your last visit?"    NO        "Have you had a pap smear?"    NO    No cervical cancer screening on file

## 2024-02-20 NOTE — Progress Notes (Signed)
 [facility]      MR#: [id number]    HISTORY OF PRESENT ILLNESS  History of Present Illness  The patient is a 48 year old female with type 2 diabetes, who presents for an acute follow-up with hyperglycemia. At her last visit, there was an attempt to switch her from Trulicity  4.5 mg to Ozempic ; however, this was denied by insurance. She has been out of her Trulicity  and is currently taking metformin . Complaints of hypoglycemic episodes are noted.    Blood glucose levels have been elevated, ranging between 200s and 400s, with a reading in the 200s today. Ozempic  has not been obtained. Trulicity  has not been taken for approximately a month, resulting in weight gain during this period. Previously, she was on the highest dose of Trulicity . Farxiga  has been exhausted, which was taken once daily, and the pharmacy has informed her that it will not be available until June. Metformin  is currently being taken.    Blood pressure readings have been within normal limits, with today's reading being particularly good.    Adherence to thyroid medication regimen as prescribed is reported.    A recent mammogram was normal. An appointment with Dr. Dene Fines is scheduled for 03/31/2024. A colonoscopy was performed in 2022.    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, cleans   [employer]      Health maintenance:  Colon cancer screening: Due in 2027  Breast cancer screening: Due next year   Cervical cancer screening: No longer has cervix   Lung cancer screening: At 50  COVID: Pfizer x 3  Influenza: UTD  PCV: PPSV23 in 2020  Shingles: At 50      Current Outpatient  Medications:     dapagliflozin  (FARXIGA ) 10 MG tablet, Take 1 tablet by mouth daily, Disp: 90 tablet, Rfl: 3    dulaglutide  (TRULICITY ) 1.5 MG/0.5ML SC injection, Inject 0.5 mLs into the skin every 7 days, Disp: 2 mL, Rfl: 0    [START ON 03/21/2024] Dulaglutide  3 MG/0.5ML SOAJ, Inject 3 mg into the skin every 7 days, Disp: 2 mL, Rfl: 0    [START ON 04/21/2024] Dulaglutide  4.5 MG/0.5ML SOAJ, Inject 4.5 mg into the skin every 7 days, Disp: 2 mL, Rfl: 0    carvedilol  (COREG ) 25 MG tablet, Take 1 tablet by mouth 2 times daily, Disp: 180 tablet, Rfl: 3    cloNIDine  (CATAPRES ) 0.3 MG tablet, Take 1 tablet by mouth 2 times daily, Disp: 180 tablet, Rfl: 3    olmesartan  (BENICAR ) 40 MG tablet, Take 1 tablet by mouth daily, Disp: 90 tablet, Rfl: 3    amLODIPine  (NORVASC ) 10 MG tablet, Take 1 tablet by mouth daily, Disp: 90 tablet, Rfl: 3    atorvastatin  (LIPITOR) 40 MG tablet, Take 1 tablet by mouth daily, Disp: 90 tablet, Rfl: 3    metFORMIN  (GLUCOPHAGE ) 1000 MG tablet, Take 1 tablet by mouth 2 times daily (with meals), Disp: 180 tablet, Rfl: 3    baclofen  (LIORESAL ) 10 MG tablet, Take 1 tablet by mouth 3 times daily as needed (pain), Disp: 90 tablet, Rfl: 1  nitroGLYCERIN  (NITROSTAT ) 0.4 MG SL tablet, Place 1 tablet under the tongue every 5 minutes as needed for Chest pain, Disp: 25 tablet, Rfl: 1    traZODone  (DESYREL ) 150 MG tablet, Take 1 tablet by mouth nightly, Disp: , Rfl:     vitamin D  (ERGOCALCIFEROL ) 1.25 MG (50000 UT) CAPS capsule, Take 1 capsule by mouth once a week, Disp: 12 capsule, Rfl: 1    glucose monitoring kit, 1 kit by Does not apply route daily, Disp: 1 kit, Rfl: 0    venlafaxine  (EFFEXOR  XR) 75 MG extended release capsule, Take 1 capsule by mouth daily, Disp: 90 capsule, Rfl: 3    hydrOXYzine  pamoate (VISTARIL ) 50 MG capsule, Take 1 capsule by mouth 3 times daily as needed for Anxiety, Disp: 90 capsule, Rfl: 2    levothyroxine  (SYNTHROID ) 200 MCG tablet, Take 1 tablet by mouth every morning (before  breakfast), Disp: 90 tablet, Rfl: 1    Lancets MISC, , Disp: , Rfl:     aspirin  81 MG EC tablet, Take 1 tablet by mouth daily, Disp: , Rfl:     linaclotide  (LINZESS ) 290 MCG CAPS capsule, Take 1 capsule by mouth every morning (before breakfast), Disp: , Rfl:     pantoprazole  (PROTONIX ) 40 MG tablet, Take 1 tablet by mouth 2 times daily, Disp: , Rfl:     ibuprofen (ADVIL;MOTRIN) 800 MG tablet, Take 1 tablet by mouth every 8 hours as needed for Pain (Patient not taking: Reported on 02/20/2024), Disp: , Rfl:     Review of Systems   Constitutional:  Negative for appetite change, chills, fatigue, fever and unexpected weight change.   HENT:  Negative for congestion, rhinorrhea and sore throat.    Eyes:  Negative for pain.   Respiratory:  Negative for cough, chest tightness and shortness of breath.    Cardiovascular:  Negative for chest pain, palpitations and leg swelling.   Gastrointestinal:  Negative for abdominal pain, constipation, diarrhea, nausea and vomiting.   Genitourinary:  Negative for dysuria and frequency.   Musculoskeletal:  Negative for arthralgias, back pain and myalgias.   Skin:  Negative for rash and wound.   Neurological:  Negative for dizziness and weakness.     BP 130/88 (BP Site: Left Upper Arm, Patient Position: Sitting, BP Cuff Size: Medium Adult)   Pulse 99   Temp 98.1 F (36.7 C) (Skin)   Resp 13   Ht 1.524 m (5')   Wt 86.9 kg (191 lb 9.6 oz)   SpO2 99%   BMI 37.42 kg/m     Physical Exam  Constitutional:       General: She is not in acute distress.     Appearance: Normal appearance. She is not ill-appearing or toxic-appearing.   HENT:      Head: Normocephalic and atraumatic.      Mouth/Throat:      Pharynx: No posterior oropharyngeal erythema.   Cardiovascular:      Rate and Rhythm: Normal rate and regular rhythm.      Pulses: Normal pulses.      Heart sounds: Normal heart sounds. No murmur heard.     No friction rub. No gallop.   Pulmonary:      Effort: Pulmonary effort is normal. No  respiratory distress.      Breath sounds: Normal breath sounds. No wheezing, rhonchi or rales.   Abdominal:      General: Abdomen is flat. Bowel sounds are normal.      Palpations: Abdomen is soft.  Tenderness: There is no abdominal tenderness. There is no guarding.   Musculoskeletal:         General: Normal range of motion.      Cervical back: No tenderness.   Skin:     General: Skin is warm.      Capillary Refill: Capillary refill takes less than 2 seconds.      Findings: No erythema or rash.   Neurological:      General: No focal deficit present.      Mental Status: She is alert and oriented to person, place, and time. Mental status is at baseline.      Motor: No weakness.      Gait: Gait normal.   Psychiatric:         Mood and Affect: Mood normal.         Behavior: Behavior normal.         Thought Content: Thought content normal.         Judgment: Judgment normal.        Results  Imaging   - Mammogram: Normal     ASSESSMENT and PLAN    Christy [name] was seen today for blood sugar problem.    Diagnoses and all orders for this visit:    Type 2 diabetes mellitus without complication, without long-term current use of insulin   -     AMB POC GLUCOSE, QUANTITATIVE, BLOOD  -     dapagliflozin  (FARXIGA ) 10 MG tablet; Take 1 tablet by mouth daily  -     dulaglutide  (TRULICITY ) 1.5 MG/0.5ML SC injection; Inject 0.5 mLs into the skin every 7 days  -     Dulaglutide  3 MG/0.5ML SOAJ; Inject 3 mg into the skin every 7 days  -     Dulaglutide  4.5 MG/0.5ML SOAJ; Inject 4.5 mg into the skin every 7 days    Hypothyroidism, unspecified type  -     TSH reflex to FT4; Future       Assessment & Plan  1. Type 2 diabetes mellitus.  - Experiencing hyperglycemia with blood sugar levels between 200-400 mg/dL.  - Out of Trulicity  for about a month, resulting in weight gain.  - Prescription for Farxiga  renewed; continuing metformin  regimen.  - Trulicity  reintroduced at 1.5 mg for a month, then increased to 3 mg for the subsequent month, and  finally to 4.5 mg in the third month. A1c levels to be reassessed during the next visit.    2. Hypertension.  - Blood pressure has been stable and was good during today's visit.    3. Thyroid disorder.  - Thyroid function test to be conducted today.  - If elevated levels are indicated, an increase in thyroid medication dosage will be considered.    4. Health maintenance.  - Recent mammogram was normal.  - Colonoscopy performed in 2022; next one due in 2027.    Follow-up  Scheduled for a follow-up visit at the end of June 2025.    Plan of care has been discussed, patient agrees with plan; all questions answered.   More than 50% of the time spent in this visit was counseling the patient about  illness and treatment options         Chinita Cough. Maurilio Southward DO  Family Medicine  [facility]       PLEASE NOTE:   This document has been produced using voice recognition software. Unrecognized errors in transcription may be present.

## 2024-02-29 ENCOUNTER — Ambulatory Visit
Admit: 2024-02-29 | Discharge: 2024-02-29 | Payer: Medicaid (Managed Care) | Attending: Cardiovascular Disease | Primary: General Practice

## 2024-02-29 VITALS — BP 107/71 | HR 108 | Ht 60.0 in | Wt 186.0 lb

## 2024-02-29 DIAGNOSIS — R002 Palpitations: Secondary | ICD-10-CM

## 2024-02-29 NOTE — Telephone Encounter (Signed)
 PCP: Levonia Reaper, DO    Last appt:  02/29/2024   Future Appointments   Date Time Provider Department Center   04/23/2024  1:45 PM Levonia Reaper, DO BS[facility]       Requested Prescriptions     Pending Prescriptions Disp Refills    metoprolol succinate (TOPROL XL) 100 MG extended release tablet 90 tablet 2     Sig: Take 1 tablet by mouth daily       Request for a 30 or 90 day supply? Provider Discretion    Pharmacy: Confirmed     Other Comments:n/a

## 2024-02-29 NOTE — Patient Instructions (Addendum)
 New Medication/Medication Changes/Medication Refill  Stop coreg    Metoprolol succinate 100 mg tab daily     **please allow 24-48 hrs for medication to be escribed to pharmacy** If you need any refills on medications please contact your pharmacy so that the request can be escribed to the provider for review seven to 10 days prior to being out of medication.

## 2024-02-29 NOTE — Progress Notes (Unsigned)
 Identified pt with two pt identifiers(name and DOB). Reviewed record in preparation for visit and have obtained necessary documentation.    Christy [name] presents today for   Chief Complaint   Patient presents with    Follow-up     last seen 09/07/23; discuss Holter results         Pt denies DIZZINESS, SOB, CHEST PAIN/ PRESSURE, FATIGUE/WEAKNESS, HEADACHES, SWELLING.             Christy [name] preferred language for health care discussion is english/other.    Personal Protective Equipment:   Engineer, maintenance was used including: mask-surgical and hands-gloves. Patient was placed on no precaution(s). Patient was masked.    Precautions:   Patient currently on None  Patient currently roomed with door closed.    Is someone accompanying this pt? no    Is the patient using any DME equipment during OV? no    Depression Screening:      02/29/2024     9:25 AM 02/20/2024     8:07 AM 02/27/2023     2:21 PM 01/05/2023     2:49 PM 12/12/2022     2:26 PM 05/03/2022     2:46 PM 03/23/2022     2:12 PM   PHQ-9 Questionaire   Little interest or pleasure in doing things 0 0 1 0 3 0 0   Feeling down, depressed, or hopeless 0 0 1 0 3 0 0   Trouble falling or staying asleep, or sleeping too much 0 0  0 3     Feeling tired or having little energy 0 0  0 3     Poor appetite or overeating 0 0  0 3     Feeling bad about yourself - or that you are a failure or have let yourself or your family down 0 0  0 3     Trouble concentrating on things, such as reading the newspaper or watching television 0 0  0 2     Moving or speaking so slowly that other people could have noticed. Or the opposite - being so fidgety or restless that you have been moving around a lot more than usual 0 0  0 0     Thoughts that you would be better off dead, or of hurting yourself in some way 0 0  0 3     PHQ-9 Total Score 0 0 2 0 23 0 0   If you checked off any problems, how difficult have these problems made it for you to do your work, take care of things at home, or  get along with other people? 0 0  0 3          Learning Assessment:  Who is the primary learner? Patient    What is the preferred language for health care of the primary learner? ENGLISH    How does the primary learner prefer to learn new concepts? DEMONSTRATION    Answered By patient    Relationship to Learner SELF        Abuse Screening:      02/29/2024     9:00 AM 09/07/2023     8:00 AM 03/23/2022     2:00 PM 02/06/2022     1:00 PM 12/30/2021    11:00 AM   AMB Abuse Screening   Do you ever feel afraid of your partner? N N N N N   Are you in a relationship with someone who physically  or mentally threatens you? N N N N N   Is it safe for you to go home? [place]          Fall Risk      09/07/2023     8:40 AM 04/19/2023     8:42 AM 02/27/2023     2:21 PM 03/23/2022     2:12 PM 02/06/2022     1:08 PM 12/30/2021    11:44 AM   Fall Risk   Do you feel unsteady or are you worried about falling?  no no no no no no   2 or more falls in past year? no no no no no no   Fall with injury in past year? no no no no no no         Pt currently taking Anticoagulant /Antiplatelet therapy? Aspirin  81mg     Coordination of Care:  1. Have you been to the ER, urgent care clinic since your last visit? Hospitalized since your last visit? no    2. Have you seen or consulted any other health care providers outside of the [facility] since your last visit? Include any pap smears or colon screening. no      Please see Red banners under Allergies and Med Rec to remove outside inquires. All correct information has been verified with patient and added to chart.     Medication's patient's would liked removed has been marked not taking to be removed per Verbal order and read back per Kirke Pepper, MD

## 2024-03-01 MED ORDER — METOPROLOL SUCCINATE ER 100 MG PO TB24
100 | ORAL_TABLET | Freq: Every day | ORAL | 2 refills | 90.00000 days | Status: DC
Start: 2024-03-01 — End: 2024-11-17

## 2024-03-09 NOTE — Progress Notes (Signed)
 [facility] Follow-Up Note    Assessment/Plan:     Assessment & Plan  1. Tachycardia.  Her heart rate consistently exceeds the average range, with an elevated pulse rate noted during this visit. The nuclear stress test results were within normal limits.  Her left ventricular function appears normal  at 59%. A transition from carvedilol  to metoprolol  once daily has been recommended to better manage her heart rate. She has been advised to monitor her blood pressure closely and report any significant changes. If dizziness occurs, she should consider taking the medication in the evening. If she does not tolerate the new medication, she should contact the office.  Her recent 27-day 11-hour event recorder was remarkable for an average heart rate of 109 bpm.  There was no significant ectopy.  She has been prescribed metoprolol  succinate 100 mg daily.    2. Dizziness.  She continues to experience severe dizzy spells. The potential calming effect of metoprolol  may help alleviate some of these symptoms. An MRA has been scheduled for 03/11/2024 to investigate possible bleeding in the brain, as suggested by Dr. Vergia Glasgow.    3. Headaches.  She reports experiencing unusual headaches and hearing blood flow in her head. The upcoming MRA will help determine if there is any underlying issues.    4. Medication management.  She is currently taking atorvastatin  for cholesterol management. She mentioned running out of amlodipine , and a refill will be provided.    Follow-up  The patient will follow up in 8 weeks.    History of Present Illness  The patient is a 48 year old woman who presents for evaluation of tachycardia, dizziness, and headaches.    Severe episodes of dizziness are reported, accompanied by palpitations. These symptoms are managed with nitroglycerin , which she has found to be effective. The pain is described as intense, often debilitating, and associated with shortness of breath. An increase in heart  rate during these episodes causes discomfort. Current medications include carvedilol , olmesartan , and amlodipine  for blood pressure management. Concerns are expressed about potential side effects of metoprolol , including dizziness and weight gain. Intermittent swelling is also reported.    Unusual headaches and sensations in the head, along with persistent dizziness, are described. A noise in the head, identified as the sound of blood flow, is reported. Dr. Vergia Glasgow was concerned about a possible minor brain bleed and has recommended an MRA, scheduled for 03/11/2024.    Atorvastatin  is taken for cholesterol management.    SOCIAL HISTORY  Exercise: "Works out "       Current Outpatient Medications   Medication Sig Dispense Refill    dapagliflozin  (FARXIGA ) 10 MG tablet Take 1 tablet by mouth daily 90 tablet 3    dulaglutide  (TRULICITY ) 1.5 MG/0.5ML SC injection Inject 0.5 mLs into the skin every 7 days 2 mL 0    cloNIDine  (CATAPRES ) 0.3 MG tablet Take 1 tablet by mouth 2 times daily 180 tablet 3    olmesartan  (BENICAR ) 40 MG tablet Take 1 tablet by mouth daily 90 tablet 3    amLODIPine  (NORVASC ) 10 MG tablet Take 1 tablet by mouth daily 90 tablet 3    atorvastatin  (LIPITOR) 40 MG tablet Take 1 tablet by mouth daily 90 tablet 3    nitroGLYCERIN  (NITROSTAT ) 0.4 MG SL tablet Place 1 tablet under the tongue every 5 minutes as needed for Chest pain 25 tablet 1    aspirin  81 MG EC tablet Take 1 tablet by mouth daily      pantoprazole  (PROTONIX )  40 MG tablet Take 1 tablet by mouth 2 times daily      metoprolol  succinate (TOPROL  XL) 100 MG extended release tablet Take 1 tablet by mouth daily 90 tablet 2    [START ON 03/21/2024] Dulaglutide  3 MG/0.5ML SOAJ Inject 3 mg into the skin every 7 days 2 mL 0    [START ON 04/21/2024] Dulaglutide  4.5 MG/0.5ML SOAJ Inject 4.5 mg into the skin every 7 days 2 mL 0    metFORMIN  (GLUCOPHAGE ) 1000 MG tablet Take 1 tablet by mouth 2 times daily (with meals) 180 tablet 3    ibuprofen  (ADVIL;MOTRIN) 800 MG tablet Take 1 tablet by mouth every 8 hours as needed for Pain (Patient not taking: Reported on 02/20/2024)      baclofen  (LIORESAL ) 10 MG tablet Take 1 tablet by mouth 3 times daily as needed (pain) 90 tablet 1    traZODone  (DESYREL ) 150 MG tablet Take 1 tablet by mouth nightly      vitamin D  (ERGOCALCIFEROL ) 1.25 MG (50000 UT) CAPS capsule Take 1 capsule by mouth once a week 12 capsule 1    glucose monitoring kit 1 kit by Does not apply route daily 1 kit 0    venlafaxine  (EFFEXOR  XR) 75 MG extended release capsule Take 1 capsule by mouth daily 90 capsule 3    hydrOXYzine  pamoate (VISTARIL ) 50 MG capsule Take 1 capsule by mouth 3 times daily as needed for Anxiety 90 capsule 2    levothyroxine  (SYNTHROID ) 200 MCG tablet Take 1 tablet by mouth every morning (before breakfast) 90 tablet 1    Lancets MISC       linaclotide  (LINZESS ) 290 MCG CAPS capsule Take 1 capsule by mouth every morning (before breakfast)       No current facility-administered medications for this visit.     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    Enlarged heart     Hypercholesterolemia     Hypertension     IBS (irritable bowel syndrome)     Interstitial cystitis     Sciatica     Sleep apnea     Spinal stenosis     Thyroid disease      Past Surgical History:   Procedure Laterality Date    CHOLECYSTECTOMY      2008    ORTHOPEDIC SURGERY      LEFT KNEE    ORTHOPEDIC SURGERY      LEFT ARM    PARTIAL HYSTERECTOMY (CERVIX NOT REMOVED)  2003    STILL HAVE OVARIES    PELVIC LAPAROSCOPY      THYROIDECTOMY       Family History    Problem Relation Age of Onset    Diabetes Mother     Heart Disease Mother     Diabetes Father     Prostate Cancer Father     Ovarian Cancer Sister     Prostate  Cancer Brother     No Known Problems Maternal Grandmother     No Known Problems Maternal Grandfather     No Known Problems Paternal Grandmother     No Known Problems Paternal Grandfather     Heart Disease Maternal Aunt     No Known Problems Maternal Uncle     Pancreatic Cancer Paternal Aunt     No Known Problems Paternal Uncle     No Known Problems Other      Social History     Tobacco Use   Smoking Status Every Day    Current packs/day: 0.25    Average packs/day: 0.3 packs/day for 15.0 years (3.8 ttl pk-yrs)    Types: Cigarettes    Passive exposure: Never   Smokeless Tobacco Never      Patient's cardiac risk factors are diabetes mellitus, dyslipidemia, hypertension, and smoking/ tobacco exposure.      Review of Systems:     14 point review of system is negative unless mentioned in HPI    Objective:   BP 107/71 (BP Site: Left Upper Arm, Patient Position: Sitting, BP Cuff Size: Large Adult)   Pulse (!) 108   Ht 1.524 m (5')   Wt 84.4 kg (186 lb)   SpO2 100%   BMI 36.33 kg/m       General:  alert, appears stated age, cooperative, and no distress   Chest Wall: inspection normal - no chest wall deformities or tenderness, respiratory effort normal   Lung: clear to auscultation bilaterally   Heart:  normal rate, regular rhythm, normal S1, S2, no murmurs, rubs, clicks or gallops   Abdomen: soft, non-tender. Bowel sounds normal. No masses,  no organomegaly   Extremities: extremities normal, atraumatic, no cyanosis or edema Skin: no rashes   Neuro: alert, oriented, normal speech, no focal findings or movement disorder noted         Hemoglobin A1C   Date Value Ref Range Status   03/16/2021 8.8 (H) 4.2 - 5.6 % Final     Comment:     (NOTE)  HbA1C Interpretive Ranges  <5.7              Normal  5.7 - 6.4         Consider Prediabetes  >6.5              Consider  Diabetes          Lab Results   Component Value Date/Time    CHOL 155 01/22/2024 02:20 PM    HDL 50 01/22/2024 02:20 PM    LDL 81.6 01/22/2024 02:20 PM    LDL 60.8 12/12/2022 01:53 PM        Wt Readings from Last 3 Encounters:   02/29/24 84.4 kg (186 lb)   02/20/24 86.9 kg (191 lb 9.6 oz)   01/22/24 86 kg (189 lb 9.6 oz)       No results found for this or any previous visit (from the past 4464 hours).  12/09/21    TRANSTHORACIC ECHOCARDIOGRAM (TTE) COMPLETE (CONTRAST/BUBBLE/3D PRN) 12/09/2021 12:48 PM, 12/09/2021 12:00 AM (Final)    Narrative  This is a summary report. The complete report is available in the patient's medical record. If you cannot access the medical record, please contact the sending organization for a detailed fax or copy.      Left Ventricle: Mildly reduced left ventricular systolic function with a visually estimated EF of 45 - 50%. Left ventricle size is normal. LVIDd is 3.8  cm. Mildly increased wall thickness. Findings consistent with mild concentric hypertrophy. See diagram for wall motion findings.    Right Ventricle: Mildly reduced systolic function. TAPSE is 1.6 cm. RV Peak S' is 9 cm/s.    Tricuspid Valve: Unable to assess RVSP due to inadequate or insignificant tricuspid regurgitation.    Aorta: Normal sized aortic root. Ao Root diameter is 3.1 cm. Mildly dilated ascending aorta. Ao Ascending diameter is 3.6 cm. Descending aorta is not well-visualized.    Signed by: Dena K Krishnan, DO on 12/09/2021 12:48 PM, Signed by: Unknown Provider Result on 12/09/2021 12:00 AM    No results found for this or any previous visit.    No results found for this or any previous visit.

## 2024-03-25 ENCOUNTER — Encounter: Payer: Medicaid (Managed Care) | Attending: General Practice | Primary: General Practice

## 2024-03-26 ENCOUNTER — Encounter: Payer: Medicaid (Managed Care) | Attending: General Practice | Primary: General Practice

## 2024-03-27 ENCOUNTER — Ambulatory Visit
Admit: 2024-03-27 | Discharge: 2024-03-27 | Payer: Medicaid (Managed Care) | Attending: General Practice | Primary: General Practice

## 2024-03-27 VITALS — BP 118/80 | HR 107 | Temp 98.10000°F | Resp 14 | Ht 60.0 in | Wt 185.8 lb

## 2024-03-27 DIAGNOSIS — E039 Hypothyroidism, unspecified: Secondary | ICD-10-CM

## 2024-03-27 MED ORDER — BUTALBITAL-APAP-CAFFEINE 50-325-40 MG PO TABS
50-325-40 | ORAL_TABLET | ORAL | 3 refills | Status: DC | PRN
Start: 2024-03-27 — End: 2024-03-31

## 2024-03-27 NOTE — Progress Notes (Signed)
 Christy [name] is a 48 y.o. female (DOB: 10/02/1976) presenting to address:    Chief Complaint   Patient presents with    Headache     Headaches w/sharp pain; swishing sounds; cloudiness in head       Vitals:    03/27/24 1452   BP: 118/80   Pulse: (!) 107   Resp: 14   Temp: 98.1 F (36.7 C)   SpO2: 99%       "Have you been to the ER, urgent care clinic since your last visit?  Hospitalized since your last visit?"    NO    "Have you seen or consulted any other health care providers outside of [facility] since your last visit?"    NO        "Have you had a pap smear?"    NO    No cervical cancer screening on file

## 2024-03-27 NOTE — Progress Notes (Signed)
 [facility]      MR#: [id number]    HISTORY OF PRESENT ILLNESS  History of Present Illness  The patient is a 48 year old female who presents acutely with headaches.    She reports experiencing headaches, tinnitus, and a sensation of cloudiness in her head. These headaches have been present for approximately a year, with worsening symptoms and the recent onset of tinnitus. There are no associated symptoms such as sinus infection, cold, sore throat, runny nose, or earaches. She does not experience difficulty in speech or swallowing but has noticed a decline in her memory over the past month. She has been using Excedrin Migraine for her headaches, which provides occasional relief, but she does not use it daily.    She has been experiencing vomiting, which she suspects may be a side effect of Protonix , as the vomiting intensifies post-administration. She does not attribute this symptom to Trulicity . Her dietary habits have deteriorated, with instances of going without food for two days last week and consuming only an orange today. Despite this, her weight remains stable. She has been on the lowest dose of Trulicity , which was increased during her last visit. She reports no appetite suppression with Trulicity . She experiences occasional hunger but struggles to eat. She has discontinued B12 supplementation.    She experiences occasional visual disturbances, including blurriness during headaches. She experiences daily dizziness and recalls a diagnosis of atrial fibrillation about a year ago. She expresses fatigue from her current medication regimen. She has been adhering to her antihypertensive medication regimen and maintains normal blood pressure readings. She recently had her carvedilol  replaced with metoprolol  by Dr. Eustaquio Hight to manage her heart rate. She reports no constipation.    She has been experiencing sudden sleep episodes and infrequently uses baclofen  for neck and back pain due to its  ineffectiveness.    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, cleans   [employer]      Health maintenance:  Colon cancer screening: Due in 2027  Breast cancer screening: Due next year   Cervical cancer screening: No longer has cervix   Lung cancer screening: At 50  COVID: Pfizer x 3  Influenza: UTD  PCV: PPSV23 in 2020  Shingles: At 50      Current Outpatient Medications:     butalbital-acetaminophen -caffeine (FIORICET, ESGIC) 50-325-40 MG per tablet, Take 1 tablet by mouth every 4 hours as needed for Headaches, Disp: 60 tablet, Rfl: 3    metoprolol  succinate (TOPROL  XL) 100 MG extended release tablet, Take 1 tablet by mouth daily, Disp: 90 tablet, Rfl: 2    dapagliflozin  (FARXIGA ) 10 MG tablet, Take 1 tablet by mouth daily, Disp: 90 tablet, Rfl: 3    [START ON 04/21/2024] Dulaglutide  4.5 MG/0.5ML SOAJ, Inject 4.5 mg into the skin every 7 days, Disp: 2 mL, Rfl: 0    cloNIDine  (CATAPRES ) 0.3 MG tablet, Take 1 tablet by mouth 2 times daily, Disp: 180 tablet, Rfl: 3    olmesartan  (BENICAR ) 40 MG tablet, Take 1 tablet by mouth daily, Disp: 90 tablet, Rfl: 3    amLODIPine  (  NORVASC ) 10 MG tablet, Take 1 tablet by mouth daily, Disp: 90 tablet, Rfl: 3    atorvastatin  (LIPITOR) 40 MG tablet, Take 1 tablet by mouth daily, Disp: 90 tablet, Rfl: 3    metFORMIN  (GLUCOPHAGE ) 1000 MG tablet, Take 1 tablet by mouth 2 times daily (with meals), Disp: 180 tablet, Rfl: 3    ibuprofen (ADVIL;MOTRIN) 800 MG tablet, Take 1 tablet by mouth every 8 hours as needed for Pain, Disp: , Rfl:     baclofen  (LIORESAL ) 10 MG tablet, Take 1 tablet by mouth 3 times daily as needed (pain), Disp: 90  tablet, Rfl: 1    nitroGLYCERIN  (NITROSTAT ) 0.4 MG SL tablet, Place 1 tablet under the tongue every 5 minutes as needed for Chest pain, Disp: 25 tablet, Rfl: 1    traZODone  (DESYREL ) 150 MG tablet, Take 1 tablet by mouth nightly, Disp: , Rfl:     vitamin D  (ERGOCALCIFEROL ) 1.25 MG (50000 UT) CAPS capsule, Take 1 capsule by mouth once a week, Disp: 12 capsule, Rfl: 1    glucose monitoring kit, 1 kit by Does not apply route daily, Disp: 1 kit, Rfl: 0    venlafaxine  (EFFEXOR  XR) 75 MG extended release capsule, Take 1 capsule by mouth daily, Disp: 90 capsule, Rfl: 3    hydrOXYzine  pamoate (VISTARIL ) 50 MG capsule, Take 1 capsule by mouth 3 times daily as needed for Anxiety, Disp: 90 capsule, Rfl: 2    levothyroxine  (SYNTHROID ) 200 MCG tablet, Take 1 tablet by mouth every morning (before breakfast), Disp: 90 tablet, Rfl: 1    Lancets MISC, , Disp: , Rfl:     aspirin  81 MG EC tablet, Take 1 tablet by mouth daily, Disp: , Rfl:     linaclotide  (LINZESS ) 290 MCG CAPS capsule, Take 1 capsule by mouth every morning (before breakfast), Disp: , Rfl:     pantoprazole  (PROTONIX ) 40 MG tablet, Take 1 tablet by mouth 2 times daily, Disp: , Rfl:     Review of Systems   Constitutional:  Positive for fatigue. Negative for appetite change, chills, fever and unexpected weight change.   HENT:  Negative for congestion, dental problem, drooling, ear discharge, ear pain, nosebleeds, postnasal drip, rhinorrhea and sore throat.    Eyes:  Negative for pain.   Respiratory:  Negative for cough, chest tightness and shortness of breath.    Cardiovascular:  Negative for chest pain, palpitations and leg swelling.   Gastrointestinal:  Positive for nausea. Negative for abdominal pain, constipation, diarrhea and vomiting.   Genitourinary:  Negative for dysuria and frequency.   Musculoskeletal:  Negative for arthralgias, back pain and myalgias.   Skin:  Negative for rash and wound.   Neurological:  Positive for headaches. Negative for dizziness, tremors,  seizures, syncope, facial asymmetry, weakness, light-headedness and numbness.     BP 118/80 (BP Site: Left Upper Arm, Patient Position: Sitting, BP Cuff Size: Large Adult)   Pulse (!) 107   Temp 98.1 F (36.7 C) (Temporal)   Resp 14   Ht 1.524 m (5')   Wt 84.3 kg (185 lb 12.8 oz)   SpO2 99%   Breastfeeding No   BMI 36.29 kg/m     Physical Exam  Constitutional:       General: She is not in acute distress.     Appearance: Normal appearance. She is not ill-appearing or toxic-appearing.   HENT:      Head: Normocephalic and atraumatic.      Right Ear: Tympanic membrane, ear canal and  external ear normal.      Left Ear: Tympanic membrane, ear canal and external ear normal.      Nose: Nose normal. No rhinorrhea.      Mouth/Throat:      Mouth: Mucous membranes are moist.      Pharynx: Oropharynx is clear. No posterior oropharyngeal erythema.   Eyes:      General: No visual field deficit.     Extraocular Movements: Extraocular movements intact.      Conjunctiva/sclera: Conjunctivae normal.      Pupils: Pupils are equal, round, and reactive to light.   Cardiovascular:      Rate and Rhythm: Normal rate and regular rhythm.      Pulses: Normal pulses.      Heart sounds: Normal heart sounds. No murmur heard.     No friction rub. No gallop.   Pulmonary:      Effort: Pulmonary effort is normal. No respiratory distress.      Breath sounds: Normal breath sounds. No wheezing, rhonchi or rales.   Abdominal:      General: Abdomen is flat. Bowel sounds are normal.      Palpations: Abdomen is soft.      Tenderness: There is no abdominal tenderness. There is no guarding.   Musculoskeletal:         General: Normal range of motion.      Cervical back: Normal range of motion and neck supple. No tenderness.   Lymphadenopathy:      Cervical: No cervical adenopathy.   Skin:     General: Skin is warm.      Capillary Refill: Capillary refill takes less than 2 seconds.      Findings: No erythema or rash.   Neurological:      General: No  focal deficit present.      Mental Status: She is alert and oriented to person, place, and time. Mental status is at baseline.      Cranial Nerves: Cranial nerves 2-12 are intact. No cranial nerve deficit, dysarthria or facial asymmetry.      Sensory: Sensation is intact.      Motor: Motor function is intact. No weakness.      Coordination: Coordination is intact.      Gait: Gait normal.      Deep Tendon Reflexes: Reflexes are normal and symmetric.   Psychiatric:         Mood and Affect: Mood normal.         Behavior: Behavior normal.         Thought Content: Thought content normal.         Judgment: Judgment normal.        Results  Imaging   - MRA of head with and without contrast: Completely normal     ASSESSMENT and PLAN    Wladyslawa was seen today for headache.    Diagnoses and all orders for this visit:    Hypothyroidism, unspecified type  -     TSH + Free T4 Panel; Future    Pulsatile tinnitus of both ears  -     External Referral To ENT    Acute non intractable tension-type headache  -     butalbital-acetaminophen -caffeine (FIORICET, ESGIC) 50-325-40 MG per tablet; Take 1 tablet by mouth every 4 hours as needed for Headaches       Assessment & Plan  1. Headaches.  - Headaches are not attributed to dehydration, as adequate hydration is maintained.  - The  etiology of headaches is uncertain, but it is unlikely to be related to metoprolol .  - Experiencing headaches for approximately a year, with worsening symptoms and recent onset of tinnitus.  - Fioricet prescribed for use as needed at the onset of a headache; referral to ENT specialist for further evaluation of tinnitus.    2. Tinnitus.  - Reports noise in the head and a cloudy feeling, primarily at the top of the head.  - MRA of the head with and without contrast was completely normal.  - Referral to ENT specialist for further evaluation of tinnitus.    3.Hypothyroidism  - Experiences daily dizziness; pulse remains high.  - History of atrial fibrillation (A-fib),  which occurs intermittently.  - Thyroid function to be reassessed with TSH and T4 tests; potential reduction in thyroid medication dosage if TSH levels remain elevated.    Plan of care has been discussed, patient agrees with plan; all questions answered.   More than 50% of the time spent in this visit was counseling the patient about  illness and treatment options     Follow up in in June.        Chinita Cough. Maurilio Southward DO  Family Medicine  [facility]       PLEASE NOTE:   This document has been produced using voice recognition software. Unrecognized errors in transcription may be present.

## 2024-03-28 ENCOUNTER — Inpatient Hospital Stay: Admit: 2024-03-28 | Payer: Medicaid (Managed Care) | Primary: General Practice

## 2024-03-28 ENCOUNTER — Encounter: Admit: 2024-03-28 | Discharge: 2024-03-28 | Payer: Medicaid (Managed Care) | Primary: General Practice

## 2024-03-28 DIAGNOSIS — E039 Hypothyroidism, unspecified: Secondary | ICD-10-CM

## 2024-03-28 LAB — TSH + FREE T4 PANEL
T4 Free: 1.1 ng/dL (ref 0.9–1.7)
TSH, 3rd Generation: 2.53 u[IU]/mL (ref 0.27–4.20)

## 2024-03-31 ENCOUNTER — Encounter

## 2024-03-31 MED ORDER — BUTALBITAL-APAP-CAFFEINE 50-325-40 MG PO TABS
50-325-40 | ORAL_TABLET | ORAL | 3 refills | Status: DC | PRN
Start: 2024-03-31 — End: 2024-04-02

## 2024-04-02 ENCOUNTER — Encounter

## 2024-04-02 MED ORDER — BUTALBITAL-APAP-CAFFEINE 50-325-40 MG PO TABS
50-325-40 | ORAL_TABLET | ORAL | 3 refills | Status: AC | PRN
Start: 2024-04-02 — End: ?

## 2024-04-07 ENCOUNTER — Encounter

## 2024-04-07 MED ORDER — BUTALBITAL-APAP-CAFFEINE 50-325-40 MG PO TABS
50-325-40 | ORAL_TABLET | ORAL | 3 refills | Status: DC | PRN
Start: 2024-04-07 — End: 2024-06-21

## 2024-04-09 ENCOUNTER — Encounter

## 2024-04-09 MED ORDER — DULAGLUTIDE 4.5 MG/0.5ML SC SOAJ
4.5 | SUBCUTANEOUS | 5 refills | 28.00000 days | Status: DC
Start: 2024-04-09 — End: 2024-10-15

## 2024-04-17 ENCOUNTER — Encounter

## 2024-04-17 MED ORDER — LEVOTHYROXINE SODIUM 200 MCG PO TABS
200 | ORAL_TABLET | Freq: Every day | ORAL | 1 refills | Status: DC
Start: 2024-04-17 — End: 2024-10-15

## 2024-04-17 MED ORDER — LINACLOTIDE 290 MCG PO CAPS
290 | ORAL_CAPSULE | Freq: Every day | ORAL | 2 refills | 90.00000 days | Status: DC
Start: 2024-04-17 — End: 2024-06-03

## 2024-04-17 NOTE — Telephone Encounter (Signed)
 Medication refill request.

## 2024-04-18 NOTE — Telephone Encounter (Signed)
 Attempted to start prior authorization for linzess  insurance eligibility was not found on cover my meds

## 2024-04-23 ENCOUNTER — Encounter: Admit: 2024-04-23 | Discharge: 2024-04-23 | Payer: Medicaid (Managed Care) | Primary: General Practice

## 2024-04-23 ENCOUNTER — Ambulatory Visit
Admit: 2024-04-23 | Discharge: 2024-04-23 | Payer: Medicaid (Managed Care) | Attending: General Practice | Primary: General Practice

## 2024-04-23 VITALS — BP 120/86 | HR 113 | Temp 98.40000°F | Resp 16 | Ht 60.0 in | Wt 185.0 lb

## 2024-04-23 DIAGNOSIS — M797 Fibromyalgia: Principal | ICD-10-CM

## 2024-04-23 DIAGNOSIS — E119 Type 2 diabetes mellitus without complications: Secondary | ICD-10-CM

## 2024-04-23 LAB — AMB POC HEMOGLOBIN A1C: Hemoglobin A1C, POC: 6.9 %

## 2024-04-23 MED ORDER — ARIPIPRAZOLE 5 MG PO TABS
5 | ORAL_TABLET | Freq: Every day | ORAL | 3 refills | 30.00000 days | Status: DC
Start: 2024-04-23 — End: 2024-06-03

## 2024-04-23 MED ORDER — CYCLOBENZAPRINE HCL 10 MG PO TABS
10 | ORAL_TABLET | Freq: Three times a day (TID) | ORAL | 3 refills | 10.00000 days | Status: AC | PRN
Start: 2024-04-23 — End: 2024-06-29

## 2024-04-23 MED ORDER — VENLAFAXINE HCL ER 150 MG PO CP24
150 | ORAL_CAPSULE | Freq: Every day | ORAL | 1 refills | 90.00000 days | Status: DC
Start: 2024-04-23 — End: 2024-08-12

## 2024-04-23 NOTE — Progress Notes (Signed)
 [facility]      MR#: [id number]    HISTORY OF PRESENT ILLNESS  History of Present Illness  The patient is a 48 year old female who presents for a 40-month follow-up for diabetes, hypertension, hyperlipidemia, and hypothyroidism. She takes metformin  1000 mg twice daily, Trulicity  4.5 mg weekly, Farxiga  10 mg daily, Synthroid  200 mcg, metoprolol  100 mg daily, and olmesartan  40 mg daily. She previously took Lipitor 40 mg for cholesterol but discontinued it due to muscle aches.    She reports persistent headaches that have not been alleviated by her current medication. Despite a referral to an ENT specialist, she has not scheduled an appointment yet. She describes experiencing severe, sudden, and widespread pain affecting her entire body, including her buttocks. The pain is constant and occurs during movement, sitting, and lying down. She experienced an episode last night lasting approximately 15 minutes, starting in her feet and radiating to her arms and legs. The pain does not improve with movement. Additionally, she reports shoulder and back pain.    She has been struggling with sleep disturbances, even when taking trazodone  150 mg. She reports constant fatigue and describes her pain as a burning sensation accompanied by pins and needles, tingling, and numbness. She also experiences digestive issues, urinary problems, headaches, and restlessness. Her pain significantly impacts her daily life, particularly her sleep and concentration.    She is currently on Effexor  and experienced a severe mental health crisis last week, characterized by intense depression and suicidal ideation. She sought help at [facility] but left after being asked to wait in the hallway. She attempted to contact the 988 hotline but found their response unhelpful. She reports that her depression has been particularly severe recently. She has been using hydroxyzine  as needed for anxiety but reports that her anxiety  has been worsening, with symptoms including waking up shaking. She has discontinued Latuda  due to adverse effects and is currently seeking therapy for her mental health. She has previously tried Seroquel and Abilify but discontinued them due to weight gain and other side effects.    Past medical history:  Type 2 diabetes   Coronary artery disease seen in [facility]  Aflutter   Hypothyroidism  GERD  Hyperlipidemia  Hypertension  Migraine  Obesity  Degenerative disc disease  Bipolar on Latuda       Social:  Tobacco use: Every day, 1ppd x 20+ years   Alcohol use: Yes  Illicit drug use: Marijuana  Housing: Lives in [place], lives alone   Employment: [employer], Equities trader   [employer]      Health maintenance:  Colon cancer screening: Due in 2027  Breast cancer screening: Due next year   Cervical cancer screening: No longer has cervix   Lung cancer screening: At 50  COVID: Pfizer x 3  Influenza: UTD  PCV: PPSV23 in 2020  Shingles: At 50      Current Outpatient Medications:     venlafaxine  (EFFEXOR  XR) 150 MG extended release capsule, Take 1 capsule by mouth daily, Disp: 90 capsule, Rfl: 1    cyclobenzaprine (FLEXERIL) 10 MG tablet, Take 1 tablet by mouth 3 times daily as needed for Muscle spasms, Disp: 50 tablet, Rfl: 3    ARIPiprazole (ABILIFY) 5 MG tablet, Take 1 tablet by mouth daily, Disp: 90 tablet, Rfl: 3    levothyroxine  (SYNTHROID ) 200 MCG tablet, Take 1 tablet by mouth every morning (before breakfast), Disp: 90 tablet, Rfl: 1    linaclotide  (LINZESS ) 290 MCG CAPS  capsule, Take 1 capsule by mouth every morning (before breakfast), Disp: 30 capsule, Rfl: 2    Dulaglutide  4.5 MG/0.5ML SOAJ, Inject 4.5 mg into the skin every 7 days, Disp: 2 mL, Rfl: 5    butalbital -acetaminophen -caffeine  (FIORICET, ESGIC) 50-325-40 MG per tablet, Take 1 tablet by mouth every 4 hours as needed for Headaches, Disp: 60 tablet, Rfl: 3    butalbital -acetaminophen -caffeine  (FIORICET, ESGIC) 50-325-40 MG per tablet, Take 1 tablet by  mouth every 4 hours as needed for Headaches, Disp: 60 tablet, Rfl: 3    metoprolol  succinate (TOPROL  XL) 100 MG extended release tablet, Take 1 tablet by mouth daily, Disp: 90 tablet, Rfl: 2    dapagliflozin  (FARXIGA ) 10 MG tablet, Take 1 tablet by mouth daily, Disp: 90 tablet, Rfl: 3    cloNIDine  (CATAPRES ) 0.3 MG tablet, Take 1 tablet by mouth 2 times daily, Disp: 180 tablet, Rfl: 3    olmesartan  (BENICAR ) 40 MG tablet, Take 1 tablet by mouth daily, Disp: 90 tablet, Rfl: 3    amLODIPine  (NORVASC ) 10 MG tablet, Take 1 tablet by mouth daily, Disp: 90 tablet, Rfl: 3    atorvastatin  (LIPITOR) 40 MG tablet, Take 1 tablet by mouth daily, Disp: 90 tablet, Rfl: 3    metFORMIN  (GLUCOPHAGE ) 1000 MG tablet, Take 1 tablet by mouth 2 times daily (with meals), Disp: 180 tablet, Rfl: 3    ibuprofen (ADVIL;MOTRIN) 800 MG tablet, Take 1 tablet by mouth every 8 hours as needed for Pain, Disp: , Rfl:     baclofen  (LIORESAL ) 10 MG tablet, Take 1 tablet by mouth 3 times daily as needed (pain), Disp: 90 tablet, Rfl: 1    nitroGLYCERIN  (NITROSTAT ) 0.4 MG SL tablet, Place 1 tablet under the tongue every 5 minutes as needed for Chest pain, Disp: 25 tablet, Rfl: 1    traZODone  (DESYREL ) 150 MG tablet, Take 1 tablet by mouth nightly, Disp: , Rfl:     vitamin D  (ERGOCALCIFEROL ) 1.25 MG (50000 UT) CAPS capsule, Take 1 capsule by mouth once a week, Disp: 12 capsule, Rfl: 1    glucose monitoring kit, 1 kit by Does not apply route daily, Disp: 1 kit, Rfl: 0    hydrOXYzine  pamoate (VISTARIL ) 50 MG capsule, Take 1 capsule by mouth 3 times daily as needed for Anxiety, Disp: 90 capsule, Rfl: 2    Lancets MISC, , Disp: , Rfl:     aspirin  81 MG EC tablet, Take 1 tablet by mouth daily, Disp: , Rfl:     pantoprazole  (PROTONIX ) 40 MG tablet, Take 1 tablet by mouth 2 times daily, Disp: , Rfl:     Review of Systems   Constitutional:  Positive for fatigue. Negative for appetite change, chills, fever and unexpected weight change.   HENT:  Negative for  congestion, rhinorrhea and sore throat.    Eyes:  Negative for pain.   Respiratory:  Negative for cough, chest tightness and shortness of breath.    Cardiovascular:  Negative for chest pain, palpitations and leg swelling.   Gastrointestinal:  Negative for abdominal pain, constipation, diarrhea, nausea and vomiting.   Genitourinary:  Negative for dysuria and frequency.   Musculoskeletal:  Positive for arthralgias, back pain and myalgias.   Skin:  Negative for rash and wound.   Neurological:  Positive for weakness and headaches. Negative for dizziness.     BP 120/86   Pulse (!) 113   Temp 98.4 F (36.9 C) (Temporal)   Resp 16   Ht 1.524 m (5')  Wt 83.9 kg (185 lb)   SpO2 98%   BMI 36.13 kg/m     Physical Exam  Constitutional:       General: She is not in acute distress.     Appearance: Normal appearance. She is not ill-appearing or toxic-appearing.   HENT:      Head: Normocephalic and atraumatic.      Mouth/Throat:      Pharynx: No posterior oropharyngeal erythema.   Cardiovascular:      Rate and Rhythm: Normal rate and regular rhythm.      Pulses: Normal pulses.      Heart sounds: Normal heart sounds. No murmur heard.     No friction rub. No gallop.   Pulmonary:      Effort: Pulmonary effort is normal. No respiratory distress.      Breath sounds: Normal breath sounds. No wheezing, rhonchi or rales.   Abdominal:      General: Abdomen is flat. Bowel sounds are normal.      Palpations: Abdomen is soft.      Tenderness: There is no abdominal tenderness. There is no guarding.   Musculoskeletal:         General: Normal range of motion.      Cervical back: No tenderness.   Skin:     General: Skin is warm.      Capillary Refill: Capillary refill takes less than 2 seconds.      Findings: No erythema or rash.   Neurological:      General: No focal deficit present.      Mental Status: She is alert and oriented to person, place, and time. Mental status is at baseline.      Motor: No weakness.      Gait: Gait normal.    Psychiatric:         Mood and Affect: Mood normal.         Behavior: Behavior normal.         Thought Content: Thought content normal.         Judgment: Judgment normal.        Results  Labs   - A1c: 6.9%     ASSESSMENT and PLAN    Christy [name] was seen today for follow-up and headache.    Diagnoses and all orders for this visit:    Type 2 diabetes mellitus without complication, without long-term current use of insulin  (HCC)  -     AMB POC HEMOGLOBIN A1C    Hypothyroidism, unspecified type    Mixed hyperlipidemia    Bipolar 2 disorder, major depressive episode (HCC)  -     venlafaxine  (EFFEXOR  XR) 150 MG extended release capsule; Take 1 capsule by mouth daily  -     ARIPiprazole (ABILIFY) 5 MG tablet; Take 1 tablet by mouth daily    Primary hypertension    Fibromyalgia  -     ANA By Multiplex Flow IA, QL; Future  -     Sedimentation Rate; Future  -     Rheumatoid Factor; Future  -     cyclobenzaprine (FLEXERIL) 10 MG tablet; Take 1 tablet by mouth 3 times daily as needed for Muscle spasms       Assessment & Plan  1. Diabetes Mellitus.  - A1c is stable at 6.9.  - No changes to her diabetes medication regimen are necessary at this time.    2. Hypertension.  - Currently on Metoprolol  100 mg daily and Olmesartan  40 mg daily.  -  No changes to her hypertension medication regimen are necessary at this time.    3. Hyperlipidemia.  - She has stopped taking Lipitor (atorvastatin ) 40 mg due to muscle aches.  - Advised to discontinue Lipitor for 2 weeks to see if muscle pains improve.    4. Hypothyroidism.  - Currently on Synthroid  200 mcg daily.  - No changes to her hypothyroidism medication regimen are necessary at this time.    5. Suspected Fibromyalgia.  - Presents with symptoms consistent with fibromyalgia, including diffuse body pain, fatigue, trouble sleeping, and headaches.  - Advised to increase Effexor  dosage to 150 mg daily.  - Prescription for Flexeril provided to replace baclofen .  - Additional blood panels ordered to  rule out underlying rheumatoid conditions. If tests return negative results, a diagnosis of fibromyalgia will be confirmed.    6. Bipolar   - Reports a recent mental health crisis with suicidal thoughts.  - Advised to increase Effexor  dosage to 150 mg daily.  - Prescription for Abilify provided to help manage depression.    Plan of care has been discussed, patient agrees with plan; all questions answered.   More than 50% of the time spent in this visit was counseling the patient about  illness and treatment options     Follow up in 3 months.        Christy [name]  Family Medicine  [facility]       PLEASE NOTE:   This document has been produced using voice recognition software. Unrecognized errors in transcription may be present.

## 2024-04-23 NOTE — Progress Notes (Signed)
 Christy [name] is a 48 y.o. female (DOB: [birth year]) presenting to address:    Chief Complaint   Patient presents with    Follow-up     Patient is having pain around the whole body     Headache     Patient states she's been having headaches everyday.        Vitals:    04/23/24 1352   BP: 120/86   Pulse: (!) 113   Resp: 16   Temp: 98.4 F (36.9 C)   SpO2: 98%       Have you been to the ER, urgent care clinic since your last visit?  Hospitalized since your last visit?   NO    Have you seen or consulted any other health care providers outside our system since your last visit?   NO     "Have you had a pap smear?"    NO    No cervical cancer screening on file       "Have you had a diabetic eye exam?"    Patient said she had one within the last year      Date of last diabetic eye exam: 01/02/2023

## 2024-04-24 ENCOUNTER — Inpatient Hospital Stay: Admit: 2024-04-24 | Payer: Medicaid (Managed Care) | Primary: General Practice

## 2024-04-24 LAB — RHEUMATOID FACTOR: Rheumatoid Factor: <10 [IU]/mL (ref 0–15)

## 2024-04-24 LAB — SEDIMENTATION RATE: Sed Rate, Automated: 27 mm/h (ref 0–30)

## 2024-04-26 LAB — ANA BY MULTIPLEX FLOW IA, QL: ANA: NEGATIVE

## 2024-04-30 ENCOUNTER — Encounter: Payer: Medicaid (Managed Care) | Attending: Cardiovascular Disease | Primary: General Practice

## 2024-05-20 ENCOUNTER — Encounter

## 2024-06-03 ENCOUNTER — Encounter: Admit: 2024-06-03 | Discharge: 2024-06-03 | Payer: Medicaid (Managed Care) | Primary: General Practice

## 2024-06-03 ENCOUNTER — Ambulatory Visit
Admit: 2024-06-03 | Discharge: 2024-06-03 | Payer: Medicaid (Managed Care) | Attending: General Practice | Primary: General Practice

## 2024-06-03 ENCOUNTER — Inpatient Hospital Stay: Admit: 2024-06-03 | Payer: Medicaid (Managed Care) | Primary: General Practice

## 2024-06-03 DIAGNOSIS — Z113 Encounter for screening for infections with a predominantly sexual mode of transmission: Principal | ICD-10-CM

## 2024-06-03 MED ORDER — POLYETHYLENE GLYCOL 3350 17 GM/SCOOP PO POWD
17 | Freq: Every day | ORAL | 2 refills | 30.00000 days | Status: AC
Start: 2024-06-03 — End: 2024-09-01

## 2024-06-03 MED ORDER — ARIPIPRAZOLE 10 MG PO TABS
10 | ORAL_TABLET | Freq: Every day | ORAL | 3 refills | 30.00000 days | Status: DC
Start: 2024-06-03 — End: 2024-10-15

## 2024-06-03 MED ORDER — OLMESARTAN MEDOXOMIL 40 MG PO TABS
40 | ORAL_TABLET | Freq: Every day | ORAL | 3 refills | 90.00000 days | Status: DC
Start: 2024-06-03 — End: 2024-06-21

## 2024-06-03 MED ORDER — LINACLOTIDE 290 MCG PO CAPS
290 | ORAL_CAPSULE | Freq: Every day | ORAL | 2 refills | 30.00000 days | Status: DC
Start: 2024-06-03 — End: 2024-10-15

## 2024-06-03 NOTE — Progress Notes (Signed)
 [facility]      MR#: [id number]    HISTORY OF PRESENT ILLNESS  History of Present Illness  The patient is a 48 year old female with bipolar disorder who presents with worsening depression. She is currently taking Abilify  5 mg and Effexor  150 mg.    She reports experiencing fatigue, anhedonia, and heightened anxiety. Her appetite has diminished, and she spends most of her time in her room, avoiding television and phone use. She has been using a journal to express her feelings and cope with her home situation, but she has not felt motivated to write in it for the past week. She believes that Abilify  is causing her to feel tired, even though she takes it at night. She typically falls asleep between 9:00 PM and 12:00 AM but wakes up around 3:30 AM or 4:00 AM and remains awake for the rest of the day. She has never taken valproate. She has been engaging in physical activity such as walking and is open to counseling, although she has found it challenging to find a suitable counselor. She continues to take Abilify  and Effexor  but reports no improvement in her symptoms. She has previously taken Abilify  and Seroquel, which she discontinued due to weight gain. She also takes Vistaril  as needed, approximately 3 to 4 times per week, and trazodone  early in the evening if necessary. She has tried Lamictal in the past but does not recall its effects.    She expresses concern about potential infidelity in her relationship and requests comprehensive STI testing. She reports no urinary issues or vaginal discharge.    She is out of olmesartan  and Linzess , which were prescribed by another doctor. The pharmacy has been trying to get them refilled, but the doctor has not responded. She believes that Linzess  made her worse because when she ran out of it, she could not go to the bathroom. She feels like her body was dependent on it. She has not tried MiraLAX , except when she had a colonoscopy.    Occupation: Works at a  desk down the street  Hobbies: Writing in a journal  Sleep: Falls asleep between 9:00 PM and 12:00 AM, wakes up around 3:30 AM or 4:00 AM and remains awake for the rest of the day    PAST SURGICAL HISTORY: Colonoscopy    Past medical history:  Type 2 diabetes   Coronary artery disease seen in [facility]  [place]   Hypothyroidism  GERD  Hyperlipidemia  Hypertension  Migraine  Obesity  Degenerative disc disease  Bipolar on Latuda       Social:  Tobacco use: Every day, 1ppd x 20+ years   Alcohol use: Yes  Illicit drug use: Marijuana  Housing: [place] in [place], lives alone   Employment: [employer], Equities trader   [employer]      Health maintenance:  Colon cancer screening: Due in 2027  Breast cancer screening: Due next year   Cervical cancer screening: No longer has cervix   Lung cancer screening: At 50  COVID: Pfizer x 3  Influenza: UTD  PCV: PPSV23 in 2020  Shingles: At 50      Current Outpatient Medications:     ARIPiprazole  (ABILIFY ) 10 MG tablet, Take 1 tablet by mouth daily, Disp: 30 tablet, Rfl: 3    linaclotide  (LINZESS ) 290 MCG CAPS capsule, Take 1 capsule by mouth every morning (before breakfast), Disp: 30 capsule, Rfl: 2    olmesartan  (BENICAR ) 40 MG tablet, Take 1 tablet by mouth daily, Disp:  90 tablet, Rfl: 3    polyethylene glycol (GLYCOLAX ) 17 GM/SCOOP powder, Take 17 g by mouth daily, Disp: 510 g, Rfl: 2    venlafaxine  (EFFEXOR  XR) 150 MG extended release capsule, Take 1 capsule by mouth daily, Disp: 90 capsule, Rfl: 1    cyclobenzaprine  (FLEXERIL ) 10 MG tablet, Take 1 tablet by mouth 3 times daily as needed for Muscle spasms, Disp: 50 tablet, Rfl: 3    levothyroxine  (SYNTHROID ) 200 MCG tablet, Take 1 tablet by mouth every morning (before breakfast), Disp: 90 tablet, Rfl: 1    Dulaglutide  4.5 MG/0.5ML SOAJ, Inject 4.5 mg into the skin every 7 days, Disp: 2 mL, Rfl: 5    butalbital -acetaminophen -caffeine  (FIORICET, ESGIC) 50-325-40 MG per tablet, Take 1 tablet by mouth every 4 hours as needed for  Headaches, Disp: 60 tablet, Rfl: 3    metoprolol  succinate (TOPROL  XL) 100 MG extended release tablet, Take 1 tablet by mouth daily, Disp: 90 tablet, Rfl: 2    dapagliflozin  (FARXIGA ) 10 MG tablet, Take 1 tablet by mouth daily, Disp: 90 tablet, Rfl: 3    cloNIDine  (CATAPRES ) 0.3 MG tablet, Take 1 tablet by mouth 2 times daily, Disp: 180 tablet, Rfl: 3    atorvastatin  (LIPITOR) 40 MG tablet, Take 1 tablet by mouth daily, Disp: 90 tablet, Rfl: 3    metFORMIN  (GLUCOPHAGE ) 1000 MG tablet, Take 1 tablet by mouth 2 times daily (with meals), Disp: 180 tablet, Rfl: 3    nitroGLYCERIN  (NITROSTAT ) 0.4 MG SL tablet, Place 1 tablet under the tongue every 5 minutes as needed for Chest pain, Disp: 25 tablet, Rfl: 1    traZODone  (DESYREL ) 150 MG tablet, Take 1 tablet by mouth nightly, Disp: , Rfl:     vitamin D  (ERGOCALCIFEROL ) 1.25 MG (50000 UT) CAPS capsule, Take 1 capsule by mouth once a week, Disp: 12 capsule, Rfl: 1    glucose monitoring kit, 1 kit by Does not apply route daily, Disp: 1 kit, Rfl: 0    hydrOXYzine  pamoate (VISTARIL ) 50 MG capsule, Take 1 capsule by mouth 3 times daily as needed for Anxiety, Disp: 90 capsule, Rfl: 2    Lancets MISC, , Disp: , Rfl:     aspirin  81 MG EC tablet, Take 1 tablet by mouth daily, Disp: , Rfl:     pantoprazole  (PROTONIX ) 40 MG tablet, Take 1 tablet by mouth 2 times daily, Disp: , Rfl:     butalbital -acetaminophen -caffeine  (FIORICET, ESGIC) 50-325-40 MG per tablet, Take 1 tablet by mouth every 4 hours as needed for Headaches (Patient not taking: Reported on 06/03/2024), Disp: 60 tablet, Rfl: 3    amLODIPine  (NORVASC ) 10 MG tablet, Take 1 tablet by mouth daily (Patient not taking: Reported on 06/03/2024), Disp: 90 tablet, Rfl: 3    ibuprofen (ADVIL;MOTRIN) 800 MG tablet, Take 1 tablet by mouth every 8 hours as needed for Pain (Patient not taking: Reported on 06/03/2024), Disp: , Rfl:     baclofen  (LIORESAL ) 10 MG tablet, Take 1 tablet by mouth 3 times daily as needed (pain) (Patient not taking:  Reported on 06/03/2024), Disp: 90 tablet, Rfl: 1    Review of Systems   Constitutional:  Positive for fatigue. Negative for appetite change, chills, fever and unexpected weight change.   HENT:  Negative for congestion, rhinorrhea and sore throat.    Eyes:  Negative for pain.   Respiratory:  Negative for cough, chest tightness and shortness of breath.    Cardiovascular:  Negative for chest pain, palpitations and leg swelling.  Gastrointestinal:  Negative for abdominal pain, constipation, diarrhea, nausea and vomiting.   Genitourinary:  Negative for dysuria and frequency.   Musculoskeletal:  Negative for arthralgias, back pain and myalgias.   Skin:  Negative for rash and wound.   Neurological:  Negative for dizziness and weakness.   Psychiatric/Behavioral:  Positive for sleep disturbance. Negative for self-injury and suicidal ideas. The patient is nervous/anxious.      BP (!) 136/90 (BP Site: Left Upper Arm, Patient Position: Sitting, BP Cuff Size: Large Adult)   Pulse (!) 118   Temp 98.4 F (36.9 C) (Temporal)   Resp 13   Ht 1.524 m (5')   Wt 79.6 kg (175 lb 6.4 oz)   SpO2 98%   BMI 34.26 kg/m     Physical Exam  Constitutional:       General: She is not in acute distress.     Appearance: Normal appearance. She is normal weight. She is not ill-appearing.   HENT:      Head: Normocephalic and atraumatic.   Skin:     General: Skin is warm.   Neurological:      General: No focal deficit present.      Mental Status: She is alert. Mental status is at baseline.   Psychiatric:         Mood and Affect: Mood normal.         Behavior: Behavior normal.         Thought Content: Thought content normal.         Judgment: Judgment normal.        Results       ASSESSMENT and PLAN    Christy [name] was seen today for follow-up.    Diagnoses and all orders for this visit:    Encounter for screening for infections with a predominantly sexual mode of transmission  -     RPR; Future  -     Chlamydia, Gonorrhea, Trichomoniasis; Future  -      HIV 1/2 Ag/Ab, 4TH Generation,W Rflx Confirm; Future    Bipolar 2 disorder, major depressive episode (HCC)  -     ARIPiprazole  (ABILIFY ) 10 MG tablet; Take 1 tablet by mouth daily  -     External Referral To Psychology    Chronic idiopathic constipation  -     linaclotide  (LINZESS ) 290 MCG CAPS capsule; Take 1 capsule by mouth every morning (before breakfast)  -     polyethylene glycol (GLYCOLAX ) 17 GM/SCOOP powder; Take 17 g by mouth daily    Primary hypertension  -     olmesartan  (BENICAR ) 40 MG tablet; Take 1 tablet by mouth daily       Assessment & Plan  1. Bipolar disorder.  - Elevated PHQ9 and GAD7  - Some of this may be worsening due to relationship  - Her depressive symptoms have intensified, necessitating an increase in her Abilify  dosage.  - The potential benefits of Seroquel were discussed, but given her current low dose of Abilify  for bipolar disorder, an increase in this medication was deemed more appropriate.  - The possibility of switching from Effexor  was considered, but due to its effectiveness in managing multiple issues, it was decided to maintain the current regimen. The potential use of valproate was discussed, but it may not be beneficial for her depression. Lamictal was also considered as a future addition to her treatment plan.  - She was advised to engage in physical activities such as walking and to consider counseling.  A referral for counseling will be provided. Her Abilify  dosage will be increased to 10 mg.    2. Depression.  - She reports worsening depressive symptoms, including fatigue, loss of pleasure, poor appetite, and increased anxiety.  - She is currently taking Effexor  150 mg and Abilify  5 mg.  - The Abilify  dosage will be increased to 10 mg to help manage these symptoms. She was advised to continue taking Effexor  as it might be addressing multiple issues.  - Counseling was recommended, and a referral will be provided.    3. Anxiety.  - She experiences significant anxiety, which  is being managed with hydroxyzine  as needed, approximately three to four times a week.  - The increase in Abilify  to 10 mg is expected to help with her anxiety symptoms.  - She was advised to continue using hydroxyzine  as needed.    4. Constipation.  - She reports issues with constipation, particularly after running out of Linzess .  - A prescription for Linzess  will be provided, pending insurance approval.  - If Linzess  is not approved, she is advised to take MiraLAX  once or twice daily.  - She was also advised to increase her fiber intake and ensure adequate hydration.    5. Sexually transmitted infection screening.  - She expressed concern about potential exposure to STIs due to her partner's infidelity.  - Blood and urine tests will be conducted today to screen for STIs.    6. Medication management.  - She requested refills for olmesartan  and Linzess .  - The Linzess  prescription will be sent to her pharmacy, and olmesartan  will be refilled.    Follow-up: A follow-up appointment is scheduled for the end of September 2025.    Plan of care has been discussed, patient agrees with plan; all questions answered.   More than 50% of the time spent in this visit was counseling the patient about  illness and treatment options       Christy [name]. Maryjane DO  Family Medicine  [facility]       PLEASE NOTE:   This document has been produced using voice recognition software. Unrecognized errors in transcription may be present.

## 2024-06-03 NOTE — Progress Notes (Signed)
 Christy [name] is a 48 y.o. female (DOB: [birth year]) presenting to address:    Chief Complaint   Patient presents with    Follow-up     Headache and back pain; wants to be tested for STI       Vitals:    06/03/24 1154   Resp: 13   Temp: 98.4 F (36.9 C)       Have you been to the ER, urgent care clinic since your last visit?  Hospitalized since your last visit?    NO    "Have you seen or consulted any other health care providers outside of [facility] since your last visit?"    NO        "Have you had a pap smear?"    NO    No cervical cancer screening on file

## 2024-06-04 LAB — CHLAMYDIA, GONORRHEA, TRICHOMONIASIS
Chlamydia trachomatis, NAA: NEGATIVE
Neisseria Gonorrhoeae, NAA: NEGATIVE
Trichomonas Vaginalis by NAA: NEGATIVE

## 2024-06-04 LAB — HIV 1/2 AG/AB, 4TH GENERATION,W RFLX CONFIRM: HIV 1/2 Interp: NONREACTIVE

## 2024-06-05 LAB — RPR: RPR: NONREACTIVE

## 2024-06-07 ENCOUNTER — Encounter

## 2024-06-11 ENCOUNTER — Ambulatory Visit
Admit: 2024-06-11 | Discharge: 2024-06-11 | Payer: Medicaid (Managed Care) | Attending: Cardiovascular Disease | Primary: General Practice

## 2024-06-11 VITALS — BP 157/115 | HR 97 | Ht 60.0 in | Wt 175.0 lb

## 2024-06-11 DIAGNOSIS — R000 Tachycardia, unspecified: Principal | ICD-10-CM

## 2024-06-11 MED ORDER — OLMESARTAN MEDOXOMIL-HCTZ 40-12.5 MG PO TABS
40-12.5 | ORAL_TABLET | Freq: Every day | ORAL | 1 refills | 90.00000 days | Status: AC
Start: 2024-06-11 — End: ?

## 2024-06-11 NOTE — Telephone Encounter (Signed)
 PCP: Maryjane Nola RAMAN, DO    Last appt:  06/11/2024   Future Appointments   Date Time Provider Department Center   07/24/2024  1:00 PM Maryjane Nola RAMAN, DO [facility]       Requested Prescriptions     Pending Prescriptions Disp Refills    olmesartan -hydroCHLOROthiazide (BENICAR  HCT) 40-12.5 MG per tablet 90 tablet 1     Sig: Take 1 tablet by mouth daily       Request for a 30 or 90 day supply? Provider Discretion    Pharmacy: confirmed     Other Comments:n/a

## 2024-06-11 NOTE — Patient Instructions (Addendum)
 New Medication/Medication Changes/Medication Refill  Olmesartan  - htcz- 40-12.5 mg tab daily     **please allow 24-48 hrs for medication to be escribed to pharmacy** If you need any refills on medications please contact your pharmacy so that the request can be escribed to the provider for review seven to 10 days prior to being out of medication.

## 2024-06-11 NOTE — Progress Notes (Unsigned)
 Identified pt with two pt identifiers(name and DOB). Reviewed record in preparation for visit and have obtained necessary documentation.    Christy [name] presents today for   Chief Complaint   Patient presents with    Follow-up     follow up         Pt c/o DIZZINESS, SOB, CHEST PAIN/ PRESSURE, FATIGUE/WEAKNESS, SWELLING.             Christy [name] preferred language for health care discussion is english/other.    Personal Protective Equipment:   Engineer, maintenance was used including: mask-surgical and hands-gloves. Patient was placed on no precaution(s). Patient was not masked.    Precautions:   Patient currently on None  Patient currently roomed with door closed.    Is someone accompanying this pt? no    Is the patient using any DME equipment during OV? no    Depression Screening:      06/11/2024     9:35 AM 06/03/2024    12:22 PM 04/23/2024     1:54 PM 02/29/2024     9:25 AM 02/20/2024     8:07 AM 02/27/2023     2:21 PM 01/05/2023     2:49 PM   PHQ-9 Questionaire   Little interest or pleasure in doing things 0 3 3 0 0 1 0   Feeling down, depressed, or hopeless 0 3 3 0 0 1 0   Trouble falling or staying asleep, or sleeping too much  3 3 0 0  0   Feeling tired or having little energy  3 3 0 0  0   Poor appetite or overeating  3 3 0 0  0   Feeling bad about yourself - or that you are a failure or have let yourself or your family down  3 3 0 0  0   Trouble concentrating on things, such as reading the newspaper or watching television  3 0 0 0  0   Moving or speaking so slowly that other people could have noticed. Or the opposite - being so fidgety or restless that you have been moving around a lot more than usual  1 0 0 0  0   Thoughts that you would be better off dead, or of hurting yourself in some way  2 2 0 0  0   PHQ-9 Total Score 0 24 20 0  0  2  0    If you checked off any problems, how difficult have these problems made it for you to do your work, take care of things at home, or get along with other people?   2 3 0 0  0       Data saved with a previous flowsheet row definition        Learning Assessment:  Who is the primary learner? Patient    What is the preferred language for health care of the primary learner? ENGLISH    How does the primary learner prefer to learn new concepts? DEMONSTRATION    Answered By self    Relationship to Learner SELF        Abuse Screening:      06/11/2024     9:00 AM 02/29/2024     9:00 AM 09/07/2023     8:00 AM 03/23/2022     2:00 PM 02/06/2022     1:00 PM 12/30/2021    11:00 AM   AMB Abuse Screening   Do you ever feel  afraid of your partner? N N N N N N   Are you in a relationship with someone who physically or mentally threatens you? N N N N N N   Is it safe for you to go home? Y Y Y Y Y Y        Fall Risk      06/11/2024     9:36 AM 09/07/2023     8:40 AM 04/19/2023     8:42 AM 02/27/2023     2:21 PM 03/23/2022     2:12 PM 02/06/2022     1:08 PM 12/30/2021    11:44 AM   Fall Risk   Do you feel unsteady or are you worried about falling?  no no no no no no no   2 or more falls in past year? no no no no no no no   Fall with injury in past year? no no no no no no no         Pt currently taking Anticoagulant /Antiplatelet therapy?  no    Coordination of Care:  1. Have you been to the ER, urgent care clinic since your last visit? Hospitalized since your last visit? no    2. Have you seen or consulted any other health care providers outside of the [facility] since your last visit? Include any pap smears or colon screening. no      Please see Red banners under Allergies and Med Rec to remove outside inquires. All correct information has been verified with patient and added to chart.     Medication's patient's would liked removed has been marked not taking to be removed per Verbal order and read back per Christy Pang, MD

## 2024-06-21 NOTE — Progress Notes (Signed)
 [facility] Follow-Up Note    Assessment/Plan:     Assessment & Plan  1. Hypertension:  - Blood pressure is elevated at 157/115 mmHg but  - Out of olmesartan  for about 2 weeks due to pharmacy issues.  - Currently on metoprolol  and clonidine .  - Transition back to olmesartan  40 mg with hydrochlorothiazide 12.5 mg.  - Prescription for olmesartan  40 mg with hydrochlorothiazide 12.5 mg will be sent to pharmacy.  - Take an additional half dose of metoprolol  when home today.  - Discuss with pharmacist to ensure all medications are synchronized for easier management.      History of Present Illness  The patient is a 48 year old woman who presents for evaluation of hypertension.    She has been without her olmesartan  medication for approximately 2 weeks due to an issue with her insurance , even though she has refills available. She plans to discuss this with her pharmacist today when she goes for her influenza vaccine. She has been on clonidine  for several years and took metoprolol  around 7:00 AM this morning. She reports feeling well overall but continues to experience occasional episodes of tachycardia and palpitations, although less frequently than before. She also experiences intermittent shortness of breath and dizziness. Recently, she has noticed swelling in her feet and legs, which fluctuates throughout the day. She occasionally experiences chest pain, but it is not severe enough to cause concern. She has lost weight recently.    SOCIAL HISTORY  Occupations: Works at [employer], previously worked in home health care.     Current Outpatient Medications   Medication Sig Dispense Refill    linaclotide  (LINZESS ) 290 MCG CAPS capsule Take 1 capsule by mouth every morning (before breakfast) 30 capsule 2    olmesartan  (BENICAR ) 40 MG tablet Take 1 tablet by mouth daily 90 tablet 3    venlafaxine  (EFFEXOR  XR) 150 MG extended release capsule Take 1 capsule by mouth daily 90 capsule 1    metoprolol  succinate  (TOPROL  XL) 100 MG extended release tablet Take 1 tablet by mouth daily 90 tablet 2    dapagliflozin  (FARXIGA ) 10 MG tablet Take 1 tablet by mouth daily 90 tablet 3    cloNIDine  (CATAPRES ) 0.3 MG tablet Take 1 tablet by mouth 2 times daily 180 tablet 3    atorvastatin  (LIPITOR) 40 MG tablet Take 1 tablet by mouth daily 90 tablet 3    nitroGLYCERIN  (NITROSTAT ) 0.4 MG SL tablet Place 1 tablet under the tongue every 5 minutes as needed for Chest pain 25 tablet 1    aspirin  81 MG EC tablet Take 1 tablet by mouth daily      pantoprazole  (PROTONIX ) 40 MG tablet Take 1 tablet by mouth 2 times daily      olmesartan -hydroCHLOROthiazide (BENICAR  HCT) 40-12.5 MG per tablet Take 1 tablet by mouth daily 90 tablet 1    ARIPiprazole  (ABILIFY ) 10 MG tablet Take 1 tablet by mouth daily 30 tablet 3    polyethylene glycol (GLYCOLAX ) 17 GM/SCOOP powder Take 17 g by mouth daily 510 g 2    cyclobenzaprine  (FLEXERIL ) 10 MG tablet Take 1 tablet by mouth 3 times daily as needed for Muscle spasms 50 tablet 3    levothyroxine  (SYNTHROID ) 200 MCG tablet Take 1 tablet by mouth every morning (before breakfast) 90 tablet 1    Dulaglutide  4.5 MG/0.5ML SOAJ Inject 4.5 mg into the skin every 7 days 2 mL 5    butalbital -acetaminophen -caffeine  (FIORICET, ESGIC) 50-325-40 MG per tablet Take 1 tablet by  mouth every 4 hours as needed for Headaches (Patient not taking: Reported on 06/11/2024) 60 tablet 3    butalbital -acetaminophen -caffeine  (FIORICET, ESGIC) 50-325-40 MG per tablet Take 1 tablet by mouth every 4 hours as needed for Headaches 60 tablet 3    amLODIPine  (NORVASC ) 10 MG tablet Take 1 tablet by mouth daily (Patient not taking: Reported on 06/11/2024) 90 tablet 3    metFORMIN  (GLUCOPHAGE ) 1000 MG tablet Take 1 tablet by mouth 2 times daily (with meals) 180 tablet 3    ibuprofen (ADVIL;MOTRIN) 800 MG tablet Take 1 tablet by mouth every 8 hours as needed for Pain (Patient not taking: Reported on 06/03/2024)      baclofen  (LIORESAL ) 10 MG tablet Take  1 tablet by mouth 3 times daily as needed (pain) (Patient not taking: Reported on 06/11/2024) 90 tablet 1    traZODone  (DESYREL ) 150 MG tablet Take 1 tablet by mouth nightly      vitamin D  (ERGOCALCIFEROL ) 1.25 MG (50000 UT) CAPS capsule Take 1 capsule by mouth once a week 12 capsule 1    glucose monitoring kit 1 kit by Does not apply route daily 1 kit 0    hydrOXYzine  pamoate (VISTARIL ) 50 MG capsule Take 1 capsule by mouth 3 times daily as needed for Anxiety 90 capsule 2    Lancets MISC        No current facility-administered medications for this visit.     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    Enlarged heart     Hypercholesterolemia     Hypertension     IBS (irritable bowel syndrome)     Interstitial cystitis     Sciatica     Sleep apnea     Spinal stenosis     Thyroid disease      Past Surgical History:   Procedure Laterality Date    CHOLECYSTECTOMY      2008    ORTHOPEDIC SURGERY      LEFT KNEE    ORTHOPEDIC SURGERY      LEFT ARM    PARTIAL HYSTERECTOMY (CERVIX NOT REMOVED)  2003    STILL HAVE OVARIES    PELVIC LAPAROSCOPY      THYROIDECTOMY       Family History   Problem Relation Age of Onset    Diabetes Mother     Heart Disease Mother     Diabetes Father     Prostate Cancer Father     Ovarian Cancer Sister     Prostate Cancer Brother     No Known Problems Maternal Grandmother     No Known Problems Maternal Grandfather     No Known Problems Paternal Grandmother     No Known Problems Paternal Grandfather     Heart Disease Maternal Aunt     No Known Problems Maternal Uncle     Pancreatic Cancer Paternal Aunt  No Known Problems Paternal Uncle     No Known Problems Other      Social History     Tobacco Use   Smoking Status Every Day    Current packs/day: 0.25    Average packs/day: 0.3 packs/day for 15.0 years (3.8 ttl pk-yrs)    Types: Cigarettes    Passive exposure: Never   Smokeless Tobacco Never      Patient's cardiac risk factors are diabetes, hypercholesterolemia remote tobacco    Review of Systems:     14 point review of system is negative unless mentioned in HPI    Objective:   BP (!) 157/115 (BP Site: Left Upper Arm, Patient Position: Sitting, BP Cuff Size: Large Adult)   Pulse 97   Ht 1.524 m (5')   Wt 79.4 kg (175 lb)   SpO2 100%   BMI 34.18 kg/m       General:  alert, appears stated age, cooperative, and no distress   Chest Wall: inspection normal - no chest wall deformities or tenderness, respiratory effort normal   Lung: clear to auscultation bilaterally   Heart:  normal rate, regular rhythm, normal S1, S2, no murmurs, rubs, clicks or gallops   Abdomen: soft, non-tender. Bowel sounds normal. No masses,  no organomegaly   Extremities: extremities normal, atraumatic, no cyanosis or edema Skin: no rashes   Neuro: alert, oriented, normal speech, no focal findings or movement disorder noted     ECG: 08/29/2023.  Sinus tachycardia.  Otherwise normal ECG.    Hemoglobin A1C   Date Value Ref Range Status   03/16/2021 8.8 (H) 4.2 - 5.6 % Final     Comment:     (NOTE)  HbA1C Interpretive Ranges  <5.7              Normal  5.7 - 6.4         Consider Prediabetes  >6.5              Consider Diabetes          Wt Readings from Last 3 Encounters:   06/11/24 79.4 kg (175 lb)   06/03/24 79.6 kg (175 lb 6.4 oz)   04/23/24 83.9 kg (185 lb)       Lab Results   Component Value Date    CHOL 155 01/22/2024    CHOL 134 12/12/2022    CHOL 272 (H) 03/16/2021     Lab Results   Component Value Date    TRIG 117 01/22/2024    TRIG 131 12/12/2022    TRIG 160 (H) 03/16/2021     Lab Results   Component Value Date    HDL 50 01/22/2024     HDL 47 12/12/2022    HDL 56 03/16/2021     Lab Results   Component Value Date    LDL 81.6 01/22/2024    LDL 60.8 12/12/2022    LDL 184 (H) 03/16/2021     Lab Results   Component Value Date    VLDL 23.4 01/22/2024    VLDL 26.2 12/12/2022    VLDL 32 03/16/2021     Lab Results   Component Value Date    CHOLHDLRATIO 3.1 01/22/2024    CHOLHDLRATIO 2.9 12/12/2022    CHOLHDLRATIO 4.9 03/16/2021              TRANSTHORACIC ECHOCARDIOGRAM (TTE) COMPLETE (CONTRAST/BUBBLE/3D PRN) 12/09/2021 1    Narrative  This is a summary report. The complete report is available in the patient's medical  record. If you cannot access the medical record, please contact the sending organization for a detailed fax or copy.      Left Ventricle: Mildly reduced left ventricular systolic function with a visually estimated EF of 45 - 50%. Left ventricle size is normal. LVIDd is 3.8 cm. Mildly increased wall thickness. Findings consistent with mild concentric hypertrophy. See diagram for wall motion findings.    Right Ventricle: Mildly reduced systolic function. TAPSE is 1.6 cm. RV Peak S' is 9 cm/s.    Tricuspid Valve: Unable to assess RVSP due to inadequate or insignificant tricuspid regurgitation.    Aorta: Normal sized aortic root. Ao Root diameter is 3.1 cm. Mildly dilated ascending aorta. Ao Ascending diameter is 3.6 cm. Descending aorta is not well-visualized.

## 2024-06-24 ENCOUNTER — Encounter

## 2024-06-24 NOTE — Telephone Encounter (Signed)
Pharmacy requested medication refill

## 2024-07-24 ENCOUNTER — Encounter: Payer: Medicaid (Managed Care) | Attending: General Practice | Primary: General Practice

## 2024-07-25 ENCOUNTER — Encounter: Payer: Medicaid (Managed Care) | Attending: General Practice | Primary: General Practice

## 2024-08-12 ENCOUNTER — Encounter

## 2024-08-12 MED ORDER — VENLAFAXINE HCL ER 150 MG PO CP24
150 | ORAL_CAPSULE | Freq: Every day | ORAL | 1 refills | 90.00000 days | Status: DC
Start: 2024-08-12 — End: 2024-10-15

## 2024-08-12 NOTE — Telephone Encounter (Signed)
"  Pharmacy requesting medication refill and send to pharmacy on file.   "

## 2024-10-15 ENCOUNTER — Ambulatory Visit
Admit: 2024-10-15 | Discharge: 2024-10-15 | Payer: Medicaid (Managed Care) | Attending: General Practice | Primary: General Practice

## 2024-10-15 VITALS — BP 160/118 | HR 96 | Temp 98.20000°F | Resp 13 | Ht 60.0 in | Wt 183.6 lb

## 2024-10-15 DIAGNOSIS — E119 Type 2 diabetes mellitus without complications: Principal | ICD-10-CM

## 2024-10-15 LAB — AMB POC HEMOGLOBIN A1C: Hemoglobin A1C, POC: 6 %

## 2024-10-15 MED ORDER — LEVOTHYROXINE SODIUM 200 MCG PO TABS
200 | ORAL_TABLET | Freq: Every day | ORAL | 1 refills | 90.00000 days | Status: AC
Start: 2024-10-15 — End: ?

## 2024-10-15 MED ORDER — HYDROXYZINE PAMOATE 50 MG PO CAPS
50 | ORAL_CAPSULE | Freq: Three times a day (TID) | ORAL | 2 refills | 30.00000 days | Status: AC | PRN
Start: 2024-10-15 — End: ?

## 2024-10-15 MED ORDER — DAPAGLIFLOZIN PROPANEDIOL 10 MG PO TABS
10 | ORAL_TABLET | Freq: Every day | ORAL | 3 refills | 90.00000 days | Status: AC
Start: 2024-10-15 — End: ?

## 2024-10-15 MED ORDER — DULAGLUTIDE 4.5 MG/0.5ML SC SOAJ
4.5 | SUBCUTANEOUS | 5 refills | 28.00000 days | Status: AC
Start: 2024-10-15 — End: ?

## 2024-10-15 MED ORDER — ARIPIPRAZOLE 10 MG PO TABS
10 | ORAL_TABLET | Freq: Every day | ORAL | 3 refills | 30.00000 days | Status: AC
Start: 2024-10-15 — End: ?

## 2024-10-15 MED ORDER — AMITRIPTYLINE HCL 10 MG PO TABS
10 | ORAL_TABLET | Freq: Every evening | ORAL | 0 refills | 45.00000 days | Status: AC
Start: 2024-10-15 — End: ?

## 2024-10-15 MED ORDER — LINACLOTIDE 290 MCG PO CAPS
290 | ORAL_CAPSULE | Freq: Every day | ORAL | 2 refills | 30.00000 days | Status: AC
Start: 2024-10-15 — End: ?

## 2024-10-15 MED ORDER — METFORMIN HCL 1000 MG PO TABS
1000 | ORAL_TABLET | Freq: Two times a day (BID) | ORAL | 3 refills | 90.00000 days | Status: AC
Start: 2024-10-15 — End: ?

## 2024-10-15 MED ORDER — CYCLOBENZAPRINE HCL 10 MG PO TABS
10 | ORAL_TABLET | Freq: Three times a day (TID) | ORAL | 3 refills | 10.00000 days | Status: AC | PRN
Start: 2024-10-15 — End: ?

## 2024-10-15 MED ORDER — VENLAFAXINE HCL ER 150 MG PO CP24
150 | ORAL_CAPSULE | Freq: Every day | ORAL | 1 refills | 90.00000 days | Status: AC
Start: 2024-10-15 — End: ?

## 2024-10-15 MED ORDER — PREGABALIN 75 MG PO CAPS
75 | ORAL_CAPSULE | Freq: Two times a day (BID) | ORAL | 1 refills | 30.00000 days | Status: AC
Start: 2024-10-15 — End: 2025-04-13

## 2024-10-15 MED ORDER — ATORVASTATIN CALCIUM 40 MG PO TABS
40 | ORAL_TABLET | Freq: Every day | ORAL | 3 refills | 90.00000 days | Status: AC
Start: 2024-10-15 — End: ?

## 2024-10-15 MED ORDER — ALBUTEROL SULFATE HFA 108 (90 BASE) MCG/ACT IN AERS
108 | Freq: Four times a day (QID) | RESPIRATORY_TRACT | 2 refills | 25.00000 days | Status: AC | PRN
Start: 2024-10-15 — End: ?

## 2024-10-15 NOTE — Progress Notes (Signed)
"  Christy [name] is a 48 y.o. female (DOB: 1976-02-26) presenting to address:    Chief Complaint   Patient presents with    Diabetes     Blood sugar running high per pt; pain in body       Vitals:    10/15/24 1106   BP: (!) 160/118   Pulse: 96   Resp: 13   Temp: 98.2 F (36.8 C)   SpO2: 99%       Have you been to the ER, urgent care clinic since your last visit?  Hospitalized since your last visit?    YES - When: approximately 1  weeks ago.  Where and Why: [facility] for nausea.    Have you seen or consulted any other health care providers outside of [facility] since your last visit?    NO        Have you had a pap smear?    NO    No cervical cancer screening on file           "

## 2024-10-15 NOTE — Progress Notes (Signed)
 "  [employer]      MR#: [id number]    HISTORY OF PRESENT ILLNESS  History of Present Illness  The patient is a 48 year old female who presents for a follow-up on type 2 diabetes. She is currently taking metformin  1000 mg, Trulicity  4.5 mg, and Farxiga  10 mg. She also has bipolar disorder with major depressive episodes, taking Effexor  150 mg, Abilify  10 mg, and Vistaril  50 mg as needed. She is on Lipitor 40 mg. For hypertension, she is on clonidine , metoprolol , olmesartan , and hydrochlorothiazide. She is on Synthroid  200 mcg for thyroid disease. She has coronary artery disease and atrial flutter. She also has lumbar degenerative disk disease and chronic pain for which she previously was following PMR; however, this has recently stopped. An MRI from 03/2023 shows degenerative disk disease in the lumbar spine with a disk bulge at L5-S1 without stenosis.    She reports a perceived weight gain and expresses concern about potential further weight increase with the initiation of Lyrica . She mentions that her insurance has not approved Ozempic  for her use. She is currently out of Farxiga  and requires a refill.    She continues to experience widespread pain, which has led to the cancellation of several appointments due to its severity. She is not currently under the care of any healthcare provider for her back pain and expresses a desire to seek medical attention for it. She has not previously tried Lyrica  for her pain management.    She is experiencing headaches and is seeking additional medication to manage this symptom.    She has been under significant stress over the past few months, which she believes may be contributing to her elevated blood pressure. She took her blood pressure medication early this morning and is adhering to her prescribed regimen of metoprolol , olmesartan , and clonidine .    She is requesting refills for all her medications, including Effexor  and Abilify . She has been without  Linzess  for some time now.    Past medical history:  Type 2 diabetes   Coronary artery disease seen in [facility]  Aflutter   Hypothyroidism  GERD  Hyperlipidemia  Hypertension  Migraine  Obesity  Degenerative disc disease  Bipolar on Latuda       Social:  Tobacco use: Every day, 1ppd x 20+ years   Alcohol use: Yes  Illicit drug use: Marijuana  Housing: Lives in [place], lives alone   Employment: [employer], equities trader   [employer]      Health maintenance:  Colon cancer screening: Due in 2027  Breast cancer screening: Due next year   Cervical cancer screening: No longer has cervix   Lung cancer screening: At 50  COVID: Pfizer x 3  Influenza: UTD  PCV: PPSV23 in 2020  Shingles: At 50      Current Outpatient Medications:     ibuprofen (ADVIL;MOTRIN) 600 MG tablet, Take 1 tablet by mouth as needed, Disp: , Rfl:     dapagliflozin  (FARXIGA ) 10 MG tablet, Take 1 tablet by mouth daily, Disp: 90 tablet, Rfl: 3    Dulaglutide  4.5 MG/0.5ML SOAJ, Inject 4.5 mg into the skin every 7 days, Disp: 2 mL, Rfl: 5    metFORMIN  (GLUCOPHAGE ) 1000 MG tablet, Take 1 tablet by mouth 2 times daily (with meals), Disp: 180 tablet, Rfl: 3    pregabalin  (LYRICA ) 75 MG capsule, Take 1 capsule by mouth 2 times daily for 180 days. Max Daily Amount: 150 mg, Disp: 180 capsule, Rfl: 1  venlafaxine  (EFFEXOR  XR) 150 MG extended release capsule, Take 1 capsule by mouth daily, Disp: 90 capsule, Rfl: 1    hydrOXYzine  pamoate (VISTARIL ) 50 MG capsule, Take 1 capsule by mouth 3 times daily as needed for Anxiety, Disp: 90 capsule, Rfl: 2    ARIPiprazole  (ABILIFY ) 10 MG tablet, Take 1 tablet by mouth daily, Disp: 30 tablet, Rfl: 3    atorvastatin  (LIPITOR) 40 MG tablet, Take 1 tablet by mouth daily, Disp: 90 tablet, Rfl: 3    linaclotide  (LINZESS ) 290 MCG CAPS capsule, Take 1 capsule by mouth every morning (before breakfast), Disp: 30 capsule, Rfl: 2    levothyroxine  (SYNTHROID ) 200 MCG tablet, Take 1 tablet by mouth every morning (before breakfast),  Disp: 90 tablet, Rfl: 1    amitriptyline  (ELAVIL ) 10 MG tablet, Take 1 tablet by mouth nightly For headache prevention, Disp: 90 tablet, Rfl: 0    cyclobenzaprine  (FLEXERIL ) 10 MG tablet, Take 1 tablet by mouth 3 times daily as needed (Back pain), Disp: 60 tablet, Rfl: 3    albuterol  sulfate HFA (VENTOLIN  HFA) 108 (90 Base) MCG/ACT inhaler, Inhale 2 puffs into the lungs 4 times daily as needed for Wheezing, Disp: 18 g, Rfl: 2    olmesartan -hydroCHLOROthiazide (BENICAR  HCT) 40-12.5 MG per tablet, Take 1 tablet by mouth daily, Disp: 90 tablet, Rfl: 1    butalbital -acetaminophen -caffeine  (FIORICET, ESGIC) 50-325-40 MG per tablet, Take 1 tablet by mouth every 4 hours as needed for Headaches, Disp: 60 tablet, Rfl: 3    metoprolol  succinate (TOPROL  XL) 100 MG extended release tablet, Take 1 tablet by mouth daily, Disp: 90 tablet, Rfl: 2    cloNIDine  (CATAPRES ) 0.3 MG tablet, Take 1 tablet by mouth 2 times daily, Disp: 180 tablet, Rfl: 3    nitroGLYCERIN  (NITROSTAT ) 0.4 MG SL tablet, Place 1 tablet under the tongue every 5 minutes as needed for Chest pain, Disp: 25 tablet, Rfl: 1    traZODone  (DESYREL ) 150 MG tablet, Take 1 tablet by mouth nightly, Disp: , Rfl:     vitamin D  (ERGOCALCIFEROL ) 1.25 MG (50000 UT) CAPS capsule, Take 1 capsule by mouth once a week, Disp: 12 capsule, Rfl: 1    glucose monitoring kit, 1 kit by Does not apply route daily, Disp: 1 kit, Rfl: 0    Lancets MISC, , Disp: , Rfl:     aspirin  81 MG EC tablet, Take 1 tablet by mouth daily, Disp: , Rfl:     pantoprazole  (PROTONIX ) 40 MG tablet, Take 1 tablet by mouth 2 times daily, Disp: , Rfl:     Review of Systems   Constitutional:  Negative for appetite change, chills, fatigue, fever and unexpected weight change.   HENT:  Negative for congestion, rhinorrhea and sore throat.    Eyes:  Negative for pain.   Respiratory:  Negative for cough, chest tightness and shortness of breath.    Cardiovascular:  Negative for chest pain, palpitations and leg swelling.    Gastrointestinal:  Negative for abdominal pain, constipation, diarrhea, nausea and vomiting.   Genitourinary:  Negative for dysuria and frequency.   Musculoskeletal:  Positive for arthralgias, back pain and myalgias.   Skin:  Negative for rash and wound.   Neurological:  Negative for dizziness and weakness.     BP (!) 160/118   Pulse 96   Temp 98.2 F (36.8 C) (Temporal)   Resp 13   Ht 1.524 m (5')   Wt 83.3 kg (183 lb 9.6 oz)   SpO2 99%   BMI  35.86 kg/m     Physical Exam  Constitutional:       General: She is not in acute distress.     Appearance: Normal appearance. She is not ill-appearing or toxic-appearing.   HENT:      Head: Normocephalic and atraumatic.      Mouth/Throat:      Pharynx: No posterior oropharyngeal erythema.   Cardiovascular:      Rate and Rhythm: Normal rate and regular rhythm.      Pulses: Normal pulses.      Heart sounds: Normal heart sounds. No murmur heard.     No friction rub. No gallop.   Pulmonary:      Effort: Pulmonary effort is normal. No respiratory distress.      Breath sounds: Normal breath sounds. No wheezing, rhonchi or rales.   Abdominal:      General: Abdomen is flat. Bowel sounds are normal.      Palpations: Abdomen is soft.      Tenderness: There is no abdominal tenderness. There is no guarding.   Musculoskeletal:         General: Normal range of motion.      Cervical back: No tenderness.   Skin:     General: Skin is warm.      Capillary Refill: Capillary refill takes less than 2 seconds.      Findings: No erythema or rash.   Neurological:      General: No focal deficit present.      Mental Status: She is alert and oriented to person, place, and time. Mental status is at baseline.      Motor: No weakness.      Gait: Gait normal.   Psychiatric:         Mood and Affect: Mood normal.         Behavior: Behavior normal.         Thought Content: Thought content normal.         Judgment: Judgment normal.        Results  Labs   - A1c: 6.0    Imaging   - MRI of the lumbar  spine: 03/2023, Degenerative disk disease with a disk bulge at L5-S1 without stenosis.     ASSESSMENT and PLAN    Christy [name] was seen today for diabetes.    Diagnoses and all orders for this visit:    Type 2 diabetes mellitus without complication, without long-term current use of insulin  (HCC)  -     AMB POC HEMOGLOBIN A1C  -     dapagliflozin  (FARXIGA ) 10 MG tablet; Take 1 tablet by mouth daily  -     Dulaglutide  4.5 MG/0.5ML SOAJ; Inject 4.5 mg into the skin every 7 days  -     metFORMIN  (GLUCOPHAGE ) 1000 MG tablet; Take 1 tablet by mouth 2 times daily (with meals)    Bipolar 2 disorder, major depressive episode (HCC)  -     venlafaxine  (EFFEXOR  XR) 150 MG extended release capsule; Take 1 capsule by mouth daily  -     hydrOXYzine  pamoate (VISTARIL ) 50 MG capsule; Take 1 capsule by mouth 3 times daily as needed for Anxiety  -     ARIPiprazole  (ABILIFY ) 10 MG tablet; Take 1 tablet by mouth daily    Primary hypertension    Fibromyalgia  -     pregabalin  (LYRICA ) 75 MG capsule; Take 1 capsule by mouth 2 times daily for 180 days. Max Daily Amount: 150 mg  -  amitriptyline  (ELAVIL ) 10 MG tablet; Take 1 tablet by mouth nightly For headache prevention  -     cyclobenzaprine  (FLEXERIL ) 10 MG tablet; Take 1 tablet by mouth 3 times daily as needed (Back pain)    Hypothyroidism, unspecified type  -     levothyroxine  (SYNTHROID ) 200 MCG tablet; Take 1 tablet by mouth every morning (before breakfast)    Mixed hyperlipidemia  -     atorvastatin  (LIPITOR) 40 MG tablet; Take 1 tablet by mouth daily    Chronic idiopathic constipation  -     linaclotide  (LINZESS ) 290 MCG CAPS capsule; Take 1 capsule by mouth every morning (before breakfast)    Degeneration of intervertebral disc of lumbar region with discogenic back pain  -     External Referral To Physical Medicine Rehab    Other orders  -     albuterol  sulfate HFA (VENTOLIN  HFA) 108 (90 Base) MCG/ACT inhaler; Inhale 2 puffs into the lungs 4 times daily as needed for Wheezing        Assessment & Plan  1. Type 2 Diabetes Mellitus:  - A1c level is commendably maintained at 6.0.  - She is currently taking metformin  1000 mg, Trulicity  4.5 mg, and Farxiga  10 mg. She reported running out of Farxiga .  - A prescription for all medications has been provided.      2. Hypertension:  - Blood pressure remains elevated despite adherence to her current medication regimen. She mentioned experiencing significant stress recently, which may be contributing to her high blood pressure.  - Having headaches  - Will continue current medications     3. Chronic Pain, Fibromyaglia, Lumbar DDD  - She continues to experience widespread pain, which has led to the cancellation of several appointments due to its severity.  - A referral to [facility]  for consultation with a Physical Medicine and Rehabilitation specialist will be made.  - Reviewed previous MRI  - Add Lyrica  and Amitriptyline  for Fibromyalgia     4. Headaches:  - Headaches are likely a result of her uncontrolled hypertension.  - A prescription for amitriptyline  will be provided.    5. Medication Management:  - She requested refills for Effexor  150 mg, Abilify  10 mg, and Linzess . Prescriptions for these medications will be provided.  - Monitor her obvious polypharmacy    Plan of care has been discussed, patient agrees with plan; all questions answered.   More than 50% of the time spent in this visit was counseling the patient about  illness and treatment options     Follow up in 3 months.        Christy [name]. Maryjane DO  Family Medicine  [facility]       PLEASE NOTE:   This document has been produced using voice recognition software. Unrecognized errors in transcription may be present.  "

## 2024-11-17 MED ORDER — METOPROLOL SUCCINATE ER 100 MG PO TB24
100 | ORAL_TABLET | Freq: Every day | ORAL | 2 refills | 90.00000 days | Status: AC
Start: 2024-11-17 — End: ?
